# Patient Record
Sex: Female | Born: 1975 | Race: White | Hispanic: No | Marital: Single | Smoking: Former smoker
Health system: Southern US, Community
[De-identification: ages and names within clinical notes are randomized; demographics above are authoritative.]

## PROBLEM LIST (undated history)

## (undated) DIAGNOSIS — L989 Disorder of the skin and subcutaneous tissue, unspecified: Secondary | ICD-10-CM

## (undated) DIAGNOSIS — I10 Essential (primary) hypertension: Secondary | ICD-10-CM

## (undated) DIAGNOSIS — G8929 Other chronic pain: Secondary | ICD-10-CM

## (undated) DIAGNOSIS — L309 Dermatitis, unspecified: Secondary | ICD-10-CM

## (undated) DIAGNOSIS — F32A Depression, unspecified: Secondary | ICD-10-CM

## (undated) DIAGNOSIS — F329 Major depressive disorder, single episode, unspecified: Secondary | ICD-10-CM

## (undated) DIAGNOSIS — G43909 Migraine, unspecified, not intractable, without status migrainosus: Secondary | ICD-10-CM

## (undated) DIAGNOSIS — R110 Nausea: Secondary | ICD-10-CM

## (undated) DIAGNOSIS — M797 Fibromyalgia: Secondary | ICD-10-CM

## (undated) DIAGNOSIS — K219 Gastro-esophageal reflux disease without esophagitis: Secondary | ICD-10-CM

## (undated) DIAGNOSIS — M5030 Other cervical disc degeneration, unspecified cervical region: Secondary | ICD-10-CM

## (undated) DIAGNOSIS — H9190 Unspecified hearing loss, unspecified ear: Secondary | ICD-10-CM

## (undated) DIAGNOSIS — F909 Attention-deficit hyperactivity disorder, unspecified type: Secondary | ICD-10-CM

## (undated) DIAGNOSIS — D649 Anemia, unspecified: Secondary | ICD-10-CM

## (undated) DIAGNOSIS — F419 Anxiety disorder, unspecified: Secondary | ICD-10-CM

## (undated) DIAGNOSIS — B009 Herpesviral infection, unspecified: Secondary | ICD-10-CM

## (undated) DIAGNOSIS — F319 Bipolar disorder, unspecified: Secondary | ICD-10-CM

## (undated) DIAGNOSIS — T7840XA Allergy, unspecified, initial encounter: Secondary | ICD-10-CM

## (undated) DIAGNOSIS — R609 Edema, unspecified: Secondary | ICD-10-CM

## (undated) DIAGNOSIS — M2550 Pain in unspecified joint: Secondary | ICD-10-CM

## (undated) DIAGNOSIS — R32 Unspecified urinary incontinence: Secondary | ICD-10-CM

## (undated) DIAGNOSIS — E039 Hypothyroidism, unspecified: Secondary | ICD-10-CM

## (undated) HISTORY — DX: Disorder of the skin and subcutaneous tissue, unspecified: L98.9

## (undated) HISTORY — DX: Major depressive disorder, single episode, unspecified: F32.9

## (undated) HISTORY — DX: Allergy, unspecified, initial encounter: T78.40XA

## (undated) HISTORY — DX: Nausea: R11.0

## (undated) HISTORY — DX: Essential (primary) hypertension: I10

## (undated) HISTORY — DX: Unspecified urinary incontinence: R32

## (undated) HISTORY — DX: Migraine, unspecified, not intractable, without status migrainosus: G43.909

## (undated) HISTORY — DX: Edema, unspecified: R60.9

## (undated) HISTORY — DX: Other cervical disc degeneration, unspecified cervical region: M50.30

## (undated) HISTORY — DX: Depression, unspecified: F32.A

## (undated) HISTORY — DX: Attention-deficit hyperactivity disorder, unspecified type: F90.9

## (undated) HISTORY — PX: KNEE SURGERY: SHX244

## (undated) HISTORY — DX: Unspecified hearing loss, unspecified ear: H91.90

## (undated) HISTORY — DX: Hypothyroidism, unspecified: E03.9

## (undated) HISTORY — DX: Herpesviral infection, unspecified: B00.9

## (undated) HISTORY — DX: Anxiety disorder, unspecified: F41.9

## (undated) HISTORY — PX: EYE SURGERY: SHX253

## (undated) HISTORY — DX: Pain in unspecified joint: M25.50

## (undated) HISTORY — DX: Fibromyalgia: M79.7

## (undated) HISTORY — DX: Other chronic pain: G89.29

## (undated) HISTORY — DX: Bipolar disorder, unspecified: F31.9

## (undated) HISTORY — PX: TONSILLECTOMY: SUR1361

---

## 2005-08-16 ENCOUNTER — Emergency Department (HOSPITAL_COMMUNITY): Admission: EM | Admit: 2005-08-16 | Discharge: 2005-08-16 | Payer: Self-pay | Admitting: Family Medicine

## 2006-01-24 HISTORY — PX: GASTRIC BYPASS: SHX52

## 2006-05-06 ENCOUNTER — Emergency Department (HOSPITAL_COMMUNITY): Admission: EM | Admit: 2006-05-06 | Discharge: 2006-05-06 | Payer: Self-pay | Admitting: Emergency Medicine

## 2006-05-21 ENCOUNTER — Ambulatory Visit (HOSPITAL_COMMUNITY): Admission: RE | Admit: 2006-05-21 | Discharge: 2006-05-21 | Payer: Self-pay | Admitting: Orthopaedic Surgery

## 2006-05-21 ENCOUNTER — Observation Stay (HOSPITAL_COMMUNITY): Admission: AD | Admit: 2006-05-21 | Discharge: 2006-05-22 | Payer: Self-pay | Admitting: Orthopaedic Surgery

## 2007-08-28 ENCOUNTER — Other Ambulatory Visit: Admission: RE | Admit: 2007-08-28 | Discharge: 2007-08-28 | Payer: Self-pay | Admitting: Internal Medicine

## 2007-09-10 ENCOUNTER — Encounter: Admission: RE | Admit: 2007-09-10 | Discharge: 2007-09-10 | Payer: Self-pay | Admitting: Internal Medicine

## 2008-09-11 ENCOUNTER — Emergency Department (HOSPITAL_COMMUNITY): Admission: EM | Admit: 2008-09-11 | Discharge: 2008-09-12 | Payer: Self-pay | Admitting: Internal Medicine

## 2010-02-15 ENCOUNTER — Encounter: Payer: Self-pay | Admitting: Internal Medicine

## 2010-05-01 LAB — URINALYSIS, ROUTINE W REFLEX MICROSCOPIC
Bilirubin Urine: NEGATIVE
Hgb urine dipstick: NEGATIVE
Nitrite: POSITIVE — AB
Specific Gravity, Urine: >1.046 — ABNORMAL HIGH (ref 1.005–1.030)
pH: 6 (ref 5.0–8.0)

## 2010-05-01 LAB — URINE CULTURE

## 2010-05-01 LAB — DIFFERENTIAL
Basophils Relative: 1 % (ref 0–1)
Lymphocytes Relative: 25 % (ref 12–46)
Lymphs Abs: 2.9 10*3/uL (ref 0.7–4.0)
Monocytes Absolute: 0.5 10*3/uL (ref 0.1–1.0)
Monocytes Relative: 4 % (ref 3–12)
Neutro Abs: 8 10*3/uL — ABNORMAL HIGH (ref 1.7–7.7)

## 2010-05-01 LAB — POCT I-STAT, CHEM 8
Chloride: 105 mEq/L (ref 96–112)
HCT: 44 % (ref 36.0–46.0)
Hemoglobin: 15 g/dL (ref 12.0–15.0)
Potassium: 3.1 mEq/L — ABNORMAL LOW (ref 3.5–5.1)
Sodium: 139 mEq/L (ref 135–145)

## 2010-05-01 LAB — POCT PREGNANCY, URINE

## 2010-05-01 LAB — CBC
Hemoglobin: 14.8 g/dL (ref 12.0–15.0)
MCHC: 34.4 g/dL (ref 30.0–36.0)
RBC: 4.45 MIL/uL (ref 3.87–5.11)
WBC: 11.6 10*3/uL — ABNORMAL HIGH (ref 4.0–10.5)

## 2010-05-01 LAB — CROSSMATCH: Antibody Screen: NEGATIVE

## 2010-05-01 LAB — URINE MICROSCOPIC-ADD ON

## 2010-05-01 LAB — ABO/RH: ABO/RH(D): A POS

## 2010-06-11 NOTE — Op Note (Signed)
Renee [name], POPPEN NO.:  [id number]   MEDICAL RECORD NO.:  [id number]          PATIENT TYPE:  OBV   LOCATION:  5020                         FACILITY:  [facility]   PHYSICIAN:  Claude Manges. Whitfield, M.D.DATE OF BIRTH:  Mar 28, 1975   DATE OF PROCEDURE:  05/21/2006  DATE OF DISCHARGE:                               OPERATIVE REPORT   PREOPERATIVE DIAGNOSIS:  Draining hematoma, left mid leg.   POSTOPERATIVE DIAGNOSIS:  Draining hematoma, left mid leg.   PROCEDURE:  Irrigation and debridement of hematoma, left leg   SURGEON:  Claude Manges. Cleophas Dunker, M.D.   ASSISTANT:  Arnoldo Morale, [facility]   ANESTHESIA:  General orotracheal.   COMPLICATIONS:  None.   HISTORY:  A 35 year old female who sustained a blunt injury to her left  mid leg approximately 2 weeks ago.  This has been followed by an  outpatient clinic with a large pretibial hematoma.  She was seen in our  office 3 days ago for incision and drainage of the hematoma, but over  the weekend has had progressive increase in size of the hematoma and  pain.  She was seen in the after-hours clinic today with a painful leg;  compartments were intact.  There was no evidence of a Homan's.  She had  a CT scan that revealed a 9x3x9 cm hematoma.  Because of her pain and  the fact that it is draining, she is taken to the operating room for  formal irrigation and debridement.   PROCEDURE:  The patient comfortable on the operating table and under  general orotracheal anesthesia, the left lower extremity was placed in a  thigh tourniquet.  The leg was then prepped with DuraPrep from the knee  to the midfoot.  Sterile draping was performed.  With the extremity  still elevated, it was Esmarch exsanguinated with a proximal tourniquet  at 350 mmHg.   The previous I&D site was located along the mid tibia and medial to the  midline.  It was probably 1/2-inch in length.  This was elongated  proximally and distally for a total length of about 2  to 2-1/2 inches.  Via sharp dissection, the incision was carried down through subcutaneous  tissue through initial fascia.  At that point we encountered a large,  clotted hematoma and this was completely evacuated.  I felt that the  compartments were perfectly supple beneath this.  There was no obvious  gross bleeding.  We irrigated the wound, did not see any further  hematoma.  We then released the tourniquet after about 5 or 6 minutes.  There was some just minimal oozing from the depth of the wound; this was  Bovie coagulated.  We did not see any other bleeding, so we copiously  irrigated the wound with saline.  We sent portions of the clot off for  culture and sensitivity.  A Jackson-Pratt drain was inserted and  externalized through a separate stab wound distally.  The wound was  closed in several layers with 2-0 Vicryl, and then simple 4-0 Ethilon  skin stitches.  A sterile bulky dressing was applied.  The  wound was  sewn in place.  Xeroform gauze was placed about the wound and around the  Jackson-Pratt tube site.  Sterile bulky dressing was applied with a  Webril and then an Ace bandage.  We had good compression of the [employer] bulb, and it did very nice decompression of the wound.   DISPOSITION:  The patient tolerated the procedure without complications.  Will plan to observe her overnight.  Will place her on Cleocin 3 doses  and monitor the drainage.      Claude Manges. Cleophas Dunker, M.D.  Electronically Signed     PWW/MEDQ  D:  05/21/2006  T:  05/21/2006  Job:  [id number]

## 2010-08-12 ENCOUNTER — Encounter: Payer: Self-pay | Admitting: Family Medicine

## 2010-08-12 ENCOUNTER — Ambulatory Visit (INDEPENDENT_AMBULATORY_CARE_PROVIDER_SITE_OTHER): Payer: BC Managed Care – HMO | Admitting: Family Medicine

## 2010-08-12 VITALS — BP 140/90 | HR 72 | Temp 97.9°F | Ht 68.75 in | Wt 230.0 lb

## 2010-08-12 DIAGNOSIS — F909 Attention-deficit hyperactivity disorder, unspecified type: Secondary | ICD-10-CM | POA: Insufficient documentation

## 2010-08-12 DIAGNOSIS — F4310 Post-traumatic stress disorder, unspecified: Secondary | ICD-10-CM | POA: Insufficient documentation

## 2010-08-12 DIAGNOSIS — F32A Depression, unspecified: Secondary | ICD-10-CM

## 2010-08-12 DIAGNOSIS — F329 Major depressive disorder, single episode, unspecified: Secondary | ICD-10-CM

## 2010-08-12 DIAGNOSIS — F341 Dysthymic disorder: Secondary | ICD-10-CM

## 2010-08-12 DIAGNOSIS — R609 Edema, unspecified: Secondary | ICD-10-CM

## 2010-08-12 DIAGNOSIS — I10 Essential (primary) hypertension: Secondary | ICD-10-CM | POA: Insufficient documentation

## 2010-08-12 DIAGNOSIS — E039 Hypothyroidism, unspecified: Secondary | ICD-10-CM | POA: Insufficient documentation

## 2010-08-12 MED ORDER — FLUOCINONIDE 0.05 % EX CREA: TOPICAL_CREAM | CUTANEOUS | Status: AC

## 2010-08-12 MED ORDER — HYDROCHLOROTHIAZIDE 25 MG PO TABS: 25.0000 mg | ORAL_TABLET | Freq: Every day | ORAL | Status: DC

## 2010-08-12 MED ORDER — LEVOTHYROXINE SODIUM 50 MCG PO TABS: 50.0000 ug | ORAL_TABLET | Freq: Every day | ORAL | Status: DC

## 2010-08-12 MED ORDER — LEVOTHYROXINE SODIUM 200 MCG PO TABS: 200.0000 ug | ORAL_TABLET | Freq: Every day | ORAL | Status: DC

## 2010-08-12 NOTE — Progress Notes (Signed)
  Subjective:    Patient ID: Renee [name], female    DOB: 1975/10/03, 35 y.o.   MRN: [id number]  HPI  35 yo here to establish care.  Anxiety/depression- 81 yo brother died unexpectedly 9 months ago.  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 Undergoing extensive psychotherapy with [facility] and Dr. Sharl Ma. Started Lexapro 20 mg last week.   Also taking Xanax as needed. No SI or HI.  Has very supportive girlfriend.  Hypothyroidism- on Synthroid 250 mcg!!  Needs medication refilled. She feels like her thyroid may be off- gaining weight, tired. She is s/p gastric bypass 4 years ago and continues to gain weight since her brother died.  LE edema- feels like her legs are swollen.  No SOB or CP.  HTN- on HCTZ 25 mg daily,a dn Potassium 20 mg daily.   Review of Systems    See HPI Objective:   Physical Exam BP 140/90  Pulse 72  Temp(Src) 97.9 F (36.6 C) (Oral)  Ht 5' 8.75" (1.746 m)  Wt 230 lb (104.327 kg)  BMI 34.21 kg/m2  LMP 08/09/2010  General:  Well-developed,well-nourished,in no acute distress; alert,appropriate and cooperative throughout examination Head:  normocephalic and atraumatic.   Eyes:  vision grossly intact, pupils equal, pupils round, and pupils reactive to light.   Ears:  R ear normal and L ear normal.   Nose:  no external deformity.   Mouth:  good dentition.   Neck:  No deformities, masses, or tenderness noted. Lungs:  Normal respiratory effort, chest expands symmetrically. Lungs are clear to auscultation, no crackles or wheezes. Heart:  Normal rate and regular rhythm. S1 and S2 normal without gallop, murmur, click, rub or other extra sounds. Abdomen:  Bowel sounds positive,abdomen soft and non-tender without masses, organomegaly or hernias noted. Msk:  No deformity or scoliosis noted of thoracic or lumbar spine.   Extremities:  No clubbing, cyanosis, edema, or deformity noted with normal full range of motion of all  joints.   Neurologic:  alert & oriented X3 and gait normal.   Skin:  Intact without suspicious lesions or rashes Cervical Nodes:  No lymphadenopathy noted Axillary Nodes:  No palpable lymphadenopathy Psych:  Cognition and judgment appear intact.  Speech is pressured and has difficulty focusing.        Assessment & Plan:   1. Edema   New.  No edema appreciated on exam. Will check TSH, CBC, BMET today.   2. Hypothyroidism  Refilled synthroid. Will check thyroid labs today. Orders Placed This Encounter  Procedures  . Basic Metabolic Panel (BMET)  . TSH  . T4, free  . CBC w/Diff     3. Hypertension  Stable.  Continue HCTZ on Potassium.  4. PTSD (post-traumatic stress disorder)  Unchanged.  Followed by Dr. Sharl [facility] and [facility].  Awaiting records.  5. Anxiety and depression  See above.

## 2010-08-13 LAB — BASIC METABOLIC PANEL
BUN: 14 mg/dL (ref 6–23)
CO2: 28 mEq/L (ref 19–32)
Chloride: 110 mEq/L (ref 96–112)
Glucose, Bld: 83 mg/dL (ref 70–99)
Potassium: 4.7 mEq/L (ref 3.5–5.1)

## 2010-08-13 LAB — CBC WITH DIFFERENTIAL/PLATELET
Basophils Relative: 0.5 % (ref 0.0–3.0)
Eosinophils Absolute: 0.2 10*3/uL (ref 0.0–0.7)
HCT: 41.7 % (ref 36.0–46.0)
Hemoglobin: 13.9 g/dL (ref 12.0–15.0)
Lymphocytes Relative: 47.3 % — ABNORMAL HIGH (ref 12.0–46.0)
MCHC: 33.5 g/dL (ref 30.0–36.0)
Neutro Abs: 2.7 10*3/uL (ref 1.4–7.7)
RBC: 4.32 Mil/uL (ref 3.87–5.11)

## 2010-08-13 LAB — TSH: TSH: 0.08 u[IU]/mL — ABNORMAL LOW (ref 0.35–5.50)

## 2010-08-18 ENCOUNTER — Encounter: Payer: Self-pay | Admitting: *Deleted

## 2010-08-19 ENCOUNTER — Telehealth: Payer: Self-pay | Admitting: *Deleted

## 2010-08-19 NOTE — Telephone Encounter (Signed)
Patient called and was advised of her lab results. 

## 2010-08-23 ENCOUNTER — Telehealth: Payer: Self-pay | Admitting: *Deleted

## 2010-08-23 MED ORDER — VALACYCLOVIR HCL 1 G PO TABS: 1000.0000 mg | ORAL_TABLET | Freq: Three times a day (TID) | ORAL | Status: DC

## 2010-08-23 NOTE — Telephone Encounter (Signed)
I will also forward this to [facility] to look into it.

## 2010-08-23 NOTE — Telephone Encounter (Signed)
I can send in Valtrex to treat shingles but that is not standard of care without seeing the rash. Due to her girlfriend's situation, it is appropriate. She does still need to be evaluated. Please send in rx for valtrex 1 g po three times daily x 7 days, #21, no refills.

## 2010-08-23 NOTE — Telephone Encounter (Signed)
Pt has rashes over several areas of her body.  She thinks this is patches of shingles.  She is asking for a stronger medicine to treat this.  The cream that she is using is causing burning and sweating.  Also, her girlfriend, who has a weakened immune system, has never had chicken pox and pt is concerned about this.  She is asking that you call her to discuss this.

## 2010-08-23 NOTE — Telephone Encounter (Signed)
Patient advised as instructed via telephone.  Rx sent to [facility]  She stated that her last OV was not covered.  She said that she was told by her insurance company that Dr. Dayton Martes did not participate with [employer].  I gave patient the phone number to the billing dept.  She stated that she will check with them before she schedules another appt.

## 2010-09-15 ENCOUNTER — Telehealth: Payer: Self-pay | Admitting: Family Medicine

## 2010-09-15 NOTE — Telephone Encounter (Signed)
Aram Beecham confirmed billing/credentialing had been made aware and the issue had been previously resolved.  Dr. Dayton Martes is a participating physician.  Pt called.

## 2010-10-11 ENCOUNTER — Other Ambulatory Visit: Payer: Self-pay | Admitting: Internal Medicine

## 2010-10-11 DIAGNOSIS — R109 Unspecified abdominal pain: Secondary | ICD-10-CM

## 2010-10-13 ENCOUNTER — Other Ambulatory Visit: Payer: BC Managed Care – HMO

## 2010-12-29 ENCOUNTER — Other Ambulatory Visit: Payer: Self-pay | Admitting: Family Medicine

## 2011-01-27 ENCOUNTER — Other Ambulatory Visit: Payer: Self-pay | Admitting: Family Medicine

## 2011-03-16 ENCOUNTER — Other Ambulatory Visit: Payer: Self-pay | Admitting: Sports Medicine

## 2011-03-16 DIAGNOSIS — M542 Cervicalgia: Secondary | ICD-10-CM

## 2011-03-20 ENCOUNTER — Ambulatory Visit
Admission: RE | Admit: 2011-03-20 | Discharge: 2011-03-20 | Disposition: A | Discharge status: Home / Self Care | Payer: BC Managed Care – HMO | Source: Ambulatory Visit | Attending: Sports Medicine | Admitting: Sports Medicine

## 2011-03-20 DIAGNOSIS — M542 Cervicalgia: Secondary | ICD-10-CM

## 2011-11-03 ENCOUNTER — Other Ambulatory Visit: Payer: Self-pay | Admitting: Family Medicine

## 2011-12-30 ENCOUNTER — Other Ambulatory Visit: Payer: Self-pay | Admitting: Family Medicine

## 2011-12-30 NOTE — Telephone Encounter (Signed)
Ok to refill only once.  Needs to be seen for further refills. 

## 2011-12-30 NOTE — Telephone Encounter (Signed)
Pt hasnt been seen since 07/2010 and has no upcoming appt's scheduled.  Please advise.

## 2012-01-31 ENCOUNTER — Other Ambulatory Visit: Payer: Self-pay | Admitting: *Deleted

## 2012-01-31 MED ORDER — LEVOTHYROXINE SODIUM 200 MCG PO TABS: ORAL_TABLET | ORAL | Status: DC

## 2012-03-01 ENCOUNTER — Other Ambulatory Visit: Payer: Self-pay | Admitting: Family Medicine

## 2012-03-01 NOTE — Telephone Encounter (Signed)
Received call from [facility] about thyroid med refill; pharmacist needed refill for thyroid med . Med was refilled by Dr Elby Showers . I called pt to see if she wanted to schedule appt with Dr Dayton Martes or was she getting med from Dr Clent Ridges; pt was upset and Hansel Starling the office manager spoke with pt. I advised [facility] that she needed to contact Dr Claris Che office re: refill.

## 2012-08-14 DIAGNOSIS — F419 Anxiety disorder, unspecified: Secondary | ICD-10-CM | POA: Insufficient documentation

## 2012-08-14 DIAGNOSIS — F988 Other specified behavioral and emotional disorders with onset usually occurring in childhood and adolescence: Secondary | ICD-10-CM | POA: Insufficient documentation

## 2012-11-14 ENCOUNTER — Other Ambulatory Visit: Payer: Self-pay | Admitting: Geriatric Medicine

## 2012-11-14 ENCOUNTER — Other Ambulatory Visit (HOSPITAL_COMMUNITY)
Admission: RE | Admit: 2012-11-14 | Discharge: 2012-11-14 | Disposition: A | Discharge status: Home / Self Care | Payer: BC Managed Care – HMO | Source: Ambulatory Visit | Attending: Geriatric Medicine | Admitting: Geriatric Medicine

## 2012-11-14 DIAGNOSIS — Z1151 Encounter for screening for human papillomavirus (HPV): Secondary | ICD-10-CM | POA: Insufficient documentation

## 2012-11-14 DIAGNOSIS — Z01419 Encounter for gynecological examination (general) (routine) without abnormal findings: Secondary | ICD-10-CM | POA: Insufficient documentation

## 2012-11-30 ENCOUNTER — Ambulatory Visit (HOSPITAL_BASED_OUTPATIENT_CLINIC_OR_DEPARTMENT_OTHER): Payer: BC Managed Care – HMO | Admitting: Anesthesiology

## 2012-11-30 ENCOUNTER — Emergency Department (HOSPITAL_COMMUNITY): Payer: BC Managed Care – HMO

## 2012-11-30 ENCOUNTER — Encounter (HOSPITAL_BASED_OUTPATIENT_CLINIC_OR_DEPARTMENT_OTHER): Payer: BC Managed Care – HMO | Admitting: Anesthesiology

## 2012-11-30 ENCOUNTER — Ambulatory Visit (HOSPITAL_BASED_OUTPATIENT_CLINIC_OR_DEPARTMENT_OTHER)
Admission: RE | Admit: 2012-11-30 | Discharge: 2012-11-30 | Disposition: A | Discharge status: Home / Self Care | Payer: BC Managed Care – HMO | Source: Ambulatory Visit | Attending: Orthopedic Surgery | Admitting: Orthopedic Surgery

## 2012-11-30 ENCOUNTER — Encounter (HOSPITAL_COMMUNITY): Payer: Self-pay | Admitting: Emergency Medicine

## 2012-11-30 ENCOUNTER — Other Ambulatory Visit: Payer: Self-pay | Admitting: Orthopedic Surgery

## 2012-11-30 ENCOUNTER — Encounter (HOSPITAL_BASED_OUTPATIENT_CLINIC_OR_DEPARTMENT_OTHER): Payer: Self-pay

## 2012-11-30 ENCOUNTER — Encounter (HOSPITAL_BASED_OUTPATIENT_CLINIC_OR_DEPARTMENT_OTHER)
Admission: RE | Disposition: A | Discharge status: Home / Self Care | Payer: Self-pay | Source: Ambulatory Visit | Attending: Orthopedic Surgery

## 2012-11-30 ENCOUNTER — Emergency Department (HOSPITAL_COMMUNITY)
Admission: EM | Admit: 2012-11-30 | Discharge: 2012-11-30 | Disposition: A | Discharge status: Home / Self Care | Payer: BC Managed Care – HMO | Source: Home / Self Care | Attending: Emergency Medicine | Admitting: Emergency Medicine

## 2012-11-30 DIAGNOSIS — Z9884 Bariatric surgery status: Secondary | ICD-10-CM | POA: Insufficient documentation

## 2012-11-30 DIAGNOSIS — F909 Attention-deficit hyperactivity disorder, unspecified type: Secondary | ICD-10-CM | POA: Insufficient documentation

## 2012-11-30 DIAGNOSIS — F329 Major depressive disorder, single episode, unspecified: Secondary | ICD-10-CM | POA: Insufficient documentation

## 2012-11-30 DIAGNOSIS — F3289 Other specified depressive episodes: Secondary | ICD-10-CM | POA: Insufficient documentation

## 2012-11-30 DIAGNOSIS — Z885 Allergy status to narcotic agent status: Secondary | ICD-10-CM | POA: Insufficient documentation

## 2012-11-30 DIAGNOSIS — F988 Other specified behavioral and emotional disorders with onset usually occurring in childhood and adolescence: Secondary | ICD-10-CM | POA: Insufficient documentation

## 2012-11-30 DIAGNOSIS — Z880 Allergy status to penicillin: Secondary | ICD-10-CM | POA: Insufficient documentation

## 2012-11-30 DIAGNOSIS — I10 Essential (primary) hypertension: Secondary | ICD-10-CM | POA: Insufficient documentation

## 2012-11-30 DIAGNOSIS — W010XXA Fall on same level from slipping, tripping and stumbling without subsequent striking against object, initial encounter: Secondary | ICD-10-CM | POA: Insufficient documentation

## 2012-11-30 DIAGNOSIS — W1789XA Other fall from one level to another, initial encounter: Secondary | ICD-10-CM | POA: Insufficient documentation

## 2012-11-30 DIAGNOSIS — F411 Generalized anxiety disorder: Secondary | ICD-10-CM | POA: Insufficient documentation

## 2012-11-30 DIAGNOSIS — Z79899 Other long term (current) drug therapy: Secondary | ICD-10-CM | POA: Insufficient documentation

## 2012-11-30 DIAGNOSIS — S5292XA Unspecified fracture of left forearm, initial encounter for closed fracture: Secondary | ICD-10-CM

## 2012-11-30 DIAGNOSIS — W1809XA Striking against other object with subsequent fall, initial encounter: Secondary | ICD-10-CM | POA: Insufficient documentation

## 2012-11-30 DIAGNOSIS — S52599A Other fractures of lower end of unspecified radius, initial encounter for closed fracture: Secondary | ICD-10-CM | POA: Insufficient documentation

## 2012-11-30 DIAGNOSIS — E039 Hypothyroidism, unspecified: Secondary | ICD-10-CM | POA: Insufficient documentation

## 2012-11-30 DIAGNOSIS — Y9289 Other specified places as the place of occurrence of the external cause: Secondary | ICD-10-CM | POA: Insufficient documentation

## 2012-11-30 DIAGNOSIS — Y9389 Activity, other specified: Secondary | ICD-10-CM | POA: Insufficient documentation

## 2012-11-30 HISTORY — PX: OPEN REDUCTION INTERNAL FIXATION (ORIF) DISTAL RADIAL FRACTURE: SHX5989

## 2012-11-30 SURGERY — OPEN REDUCTION INTERNAL FIXATION (ORIF) DISTAL RADIUS FRACTURE
Anesthesia: Regional | Site: Wrist | Laterality: Left | Wound class: Dirty or Infected

## 2012-11-30 MED ORDER — MIDAZOLAM HCL 2 MG/2ML IJ SOLN
INTRAMUSCULAR | Status: AC
  Filled 2012-11-30: qty 2

## 2012-11-30 MED ORDER — ONDANSETRON HCL 4 MG/2ML IJ SOLN
INTRAMUSCULAR | Status: DC | PRN
  Administered 2012-11-30: 4 mg via INTRAVENOUS

## 2012-11-30 MED ORDER — PROPOFOL 10 MG/ML IV BOLUS
100.0000 mg | Freq: Once | INTRAVENOUS | Status: AC
  Filled 2012-11-30 (×2): qty 20

## 2012-11-30 MED ORDER — LACTATED RINGERS IV SOLN
INTRAVENOUS | Status: DC
  Administered 2012-11-30: 14:00:00 via INTRAVENOUS

## 2012-11-30 MED ORDER — FENTANYL CITRATE 0.05 MG/ML IJ SOLN
INTRAMUSCULAR | Status: DC | PRN
  Administered 2012-11-30 (×2): 50 ug via INTRAVENOUS

## 2012-11-30 MED ORDER — BUPIVACAINE-EPINEPHRINE PF 0.5-1:200000 % IJ SOLN
INTRAMUSCULAR | Status: DC | PRN
  Administered 2012-11-30: 25 mL

## 2012-11-30 MED ORDER — CEFAZOLIN SODIUM 1-5 GM-% IV SOLN
1.0000 g | Freq: Once | INTRAVENOUS | Status: AC
  Administered 2012-11-30: 1 g via INTRAVENOUS
  Filled 2012-11-30: qty 50

## 2012-11-30 MED ORDER — HYDROMORPHONE HCL PF 1 MG/ML IJ SOLN
0.2500 mg | INTRAMUSCULAR | Status: DC | PRN
Start: 2012-11-30 — End: 2012-11-30

## 2012-11-30 MED ORDER — HYDROMORPHONE HCL PF 1 MG/ML IJ SOLN
1.0000 mg | Freq: Once | INTRAMUSCULAR | Status: AC
  Administered 2012-11-30: 1 mg via INTRAVENOUS
  Filled 2012-11-30: qty 1

## 2012-11-30 MED ORDER — HYDROMORPHONE HCL 2 MG PO TABS: 2.0000 mg | ORAL_TABLET | Freq: Four times a day (QID) | ORAL | Status: DC | PRN

## 2012-11-30 MED ORDER — VANCOMYCIN HCL IN DEXTROSE 1-5 GM/200ML-% IV SOLN
INTRAVENOUS | Status: AC
  Filled 2012-11-30: qty 200

## 2012-11-30 MED ORDER — OXYCODONE HCL 5 MG/5ML PO SOLN: 5.0000 mg | Freq: Once | ORAL | Status: DC | PRN

## 2012-11-30 MED ORDER — FENTANYL CITRATE 0.05 MG/ML IJ SOLN
50.0000 ug | Freq: Once | INTRAMUSCULAR | Status: AC
  Administered 2012-11-30: 50 ug via INTRAVENOUS
  Filled 2012-11-30: qty 2

## 2012-11-30 MED ORDER — FENTANYL CITRATE 0.05 MG/ML IJ SOLN
INTRAMUSCULAR | Status: AC
  Filled 2012-11-30: qty 2

## 2012-11-30 MED ORDER — ETOMIDATE 2 MG/ML IV SOLN
INTRAVENOUS | Status: AC | PRN
  Administered 2012-11-30 (×2): 10 mg via INTRAVENOUS

## 2012-11-30 MED ORDER — OXYCODONE HCL 5 MG PO TABS: 5.0000 mg | ORAL_TABLET | Freq: Once | ORAL | Status: DC | PRN

## 2012-11-30 MED ORDER — HYDROMORPHONE HCL 2 MG PO TABS
ORAL_TABLET | ORAL | Status: AC
  Filled 2012-11-30: qty 1

## 2012-11-30 MED ORDER — VANCOMYCIN HCL IN DEXTROSE 1-5 GM/200ML-% IV SOLN
1000.0000 mg | INTRAVENOUS | Status: AC
  Administered 2012-11-30: 1000 mg via INTRAVENOUS

## 2012-11-30 MED ORDER — OXYCODONE-ACETAMINOPHEN 5-325 MG PO TABS: 1.0000 | ORAL_TABLET | ORAL | Status: DC | PRN

## 2012-11-30 MED ORDER — CEPHALEXIN 500 MG PO CAPS: 500.0000 mg | ORAL_CAPSULE | Freq: Four times a day (QID) | ORAL | Status: DC

## 2012-11-30 MED ORDER — MIDAZOLAM HCL 2 MG/2ML IJ SOLN
1.0000 mg | INTRAMUSCULAR | Status: DC | PRN
  Administered 2012-11-30: 3 mg via INTRAVENOUS

## 2012-11-30 MED ORDER — HYDROMORPHONE HCL 2 MG PO TABS
2.0000 mg | ORAL_TABLET | Freq: Once | ORAL | Status: AC
  Administered 2012-11-30: 2 mg via ORAL

## 2012-11-30 MED ORDER — LIDOCAINE HCL (CARDIAC) 20 MG/ML IV SOLN
INTRAVENOUS | Status: DC | PRN
  Administered 2012-11-30: 100 mg via INTRAVENOUS

## 2012-11-30 MED ORDER — FENTANYL CITRATE 0.05 MG/ML IJ SOLN
INTRAMUSCULAR | Status: AC
  Filled 2012-11-30: qty 6

## 2012-11-30 MED ORDER — HYDROMORPHONE HCL PF 1 MG/ML IJ SOLN
0.5000 mg | Freq: Once | INTRAMUSCULAR | Status: AC
  Administered 2012-11-30: 0.5 mg via INTRAVENOUS
  Filled 2012-11-30: qty 1

## 2012-11-30 MED ORDER — ONDANSETRON HCL 4 MG/2ML IJ SOLN: 4.0000 mg | Freq: Once | INTRAMUSCULAR | Status: DC | PRN

## 2012-11-30 MED ORDER — MIDAZOLAM HCL 2 MG/2ML IJ SOLN
2.0000 mg | Freq: Once | INTRAMUSCULAR | Status: AC
  Filled 2012-11-30: qty 2

## 2012-11-30 MED ORDER — FENTANYL CITRATE 0.05 MG/ML IJ SOLN
100.0000 ug | Freq: Once | INTRAMUSCULAR | Status: AC
  Administered 2012-11-30: 100 ug via INTRAVENOUS
  Filled 2012-11-30: qty 2

## 2012-11-30 MED ORDER — PROPOFOL 10 MG/ML IV BOLUS
INTRAVENOUS | Status: DC | PRN
  Administered 2012-11-30: 300 mg via INTRAVENOUS

## 2012-11-30 MED ORDER — PROPOFOL 10 MG/ML IV BOLUS
INTRAVENOUS | Status: AC | PRN
  Administered 2012-11-30: 15 mg via INTRAVENOUS
  Administered 2012-11-30: 50 mg via INTRAVENOUS

## 2012-11-30 MED ORDER — OXYCODONE-ACETAMINOPHEN 5-325 MG PO TABS: ORAL_TABLET | ORAL | Status: DC

## 2012-11-30 MED ORDER — OXYCODONE-ACETAMINOPHEN 5-325 MG PO TABS
1.0000 | ORAL_TABLET | Freq: Once | ORAL | Status: AC
  Administered 2012-11-30: 1 via ORAL
  Filled 2012-11-30: qty 1

## 2012-11-30 MED ORDER — PROPOFOL BOLUS VIA INFUSION: 100.0000 mg | Freq: Once | INTRAVENOUS | Status: DC

## 2012-11-30 MED ORDER — CHLORHEXIDINE GLUCONATE 4 % EX LIQD: 60.0000 mL | Freq: Once | CUTANEOUS | Status: DC

## 2012-11-30 MED ORDER — DEXAMETHASONE SODIUM PHOSPHATE 10 MG/ML IJ SOLN
INTRAMUSCULAR | Status: DC | PRN
  Administered 2012-11-30: 8 mg

## 2012-11-30 MED ORDER — MIDAZOLAM HCL 2 MG/2ML IJ SOLN
INTRAMUSCULAR | Status: AC | PRN
  Administered 2012-11-30: 2 mg via INTRAVENOUS

## 2012-11-30 MED ORDER — FENTANYL CITRATE 0.05 MG/ML IJ SOLN
50.0000 ug | Freq: Once | INTRAMUSCULAR | Status: AC
  Administered 2012-11-30: 100 ug via INTRAVENOUS

## 2012-11-30 SURGICAL SUPPLY — 68 items
BANDAGE ELASTIC 3 VELCRO ST LF (GAUZE/BANDAGES/DRESSINGS) ×2 IMPLANT
BANDAGE GAUZE ELAST BULKY 4 IN (GAUZE/BANDAGES/DRESSINGS) ×2 IMPLANT
BIT DRILL 2.0 LNG QUCK RELEASE (BIT) ×1 IMPLANT
BIT DRILL 2.8X5 QR DISP (BIT) ×2 IMPLANT
BLADE MINI RND TIP GREEN BEAV (BLADE) IMPLANT
BLADE SURG 15 STRL LF DISP TIS (BLADE) ×2 IMPLANT
BLADE SURG 15 STRL SS (BLADE) ×2
BNDG CMPR 9X4 STRL LF SNTH (GAUZE/BANDAGES/DRESSINGS) ×1
BNDG ESMARK 4X9 LF (GAUZE/BANDAGES/DRESSINGS) ×2 IMPLANT
CHLORAPREP W/TINT 26ML (MISCELLANEOUS) ×2 IMPLANT
CORDS BIPOLAR (ELECTRODE) ×2 IMPLANT
COVER MAYO STAND STRL (DRAPES) ×2 IMPLANT
COVER TABLE BACK 60X90 (DRAPES) ×2 IMPLANT
DRAPE EXTREMITY T 121X128X90 (DRAPE) ×2 IMPLANT
DRAPE OEC MINIVIEW 54X84 (DRAPES) ×2 IMPLANT
DRAPE SURG 17X23 STRL (DRAPES) ×2 IMPLANT
DRILL 2.0 LNG QUICK RELEASE (BIT) ×2
GAUZE XEROFORM 1X8 LF (GAUZE/BANDAGES/DRESSINGS) ×2 IMPLANT
GLOVE BIO SURGEON STRL SZ7.5 (GLOVE) IMPLANT
GLOVE BIOGEL PI IND STRL 7.0 (GLOVE) ×1 IMPLANT
GLOVE BIOGEL PI IND STRL 8 (GLOVE) ×1 IMPLANT
GLOVE BIOGEL PI IND STRL 8.5 (GLOVE) ×1 IMPLANT
GLOVE BIOGEL PI INDICATOR 7.0 (GLOVE) ×1
GLOVE BIOGEL PI INDICATOR 8 (GLOVE) ×1
GLOVE BIOGEL PI INDICATOR 8.5 (GLOVE) ×1
GLOVE SURG ORTHO 8.0 STRL STRW (GLOVE) IMPLANT
GLOVE SURG SS PI 7.5 STRL IVOR (GLOVE) ×2 IMPLANT
GLOVE SURG SS PI 8.0 STRL IVOR (GLOVE) ×2 IMPLANT
GOWN BRE IMP PREV XXLGXLNG (GOWN DISPOSABLE) ×4 IMPLANT
GOWN PREVENTION PLUS XLARGE (GOWN DISPOSABLE) ×2 IMPLANT
GUIDEWIRE ORTHO 0.054X6 (WIRE) ×6 IMPLANT
NEEDLE HYPO 25X1 1.5 SAFETY (NEEDLE) IMPLANT
NS IRRIG 1000ML POUR BTL (IV SOLUTION) ×2 IMPLANT
PACK BASIN DAY SURGERY FS (CUSTOM PROCEDURE TRAY) ×2 IMPLANT
PAD CAST 3X4 CTTN HI CHSV (CAST SUPPLIES) ×1 IMPLANT
PADDING CAST ABS 4INX4YD NS (CAST SUPPLIES) ×1
PADDING CAST ABS COTTON 4X4 ST (CAST SUPPLIES) ×1 IMPLANT
PADDING CAST COTTON 3X4 STRL (CAST SUPPLIES) ×1
PLATE ACULOC 2 VDR STD LT (Plate) ×2 IMPLANT
SCREW ACTK 2 NL HEX 3.5.11 (Screw) ×2 IMPLANT
SCREW CORT FT 18X2.3XLCK HEX (Screw) ×1 IMPLANT
SCREW CORT FT 20X2.3XLCK HEX (Screw) ×1 IMPLANT
SCREW CORTICAL LOCKING 2.3X16M (Screw) ×2 IMPLANT
SCREW CORTICAL LOCKING 2.3X18M (Screw) ×8 IMPLANT
SCREW CORTICAL LOCKING 2.3X20M (Screw) ×3 IMPLANT
SCREW FX16X2.3XLCK SMTH NS CRT (Screw) ×1 IMPLANT
SCREW FX18X2.3XSMTH LCK NS CRT (Screw) ×3 IMPLANT
SCREW FX20X2.3XSMTH LCK NS CRT (Screw) ×1 IMPLANT
SCREW HEXALOBE NON-LOCK 3.5X14 (Screw) ×2 IMPLANT
SCREW NLCKG 13 3.5X13 HEXA (Screw) ×1 IMPLANT
SCREW NON-LOCK 3.5X13 (Screw) ×2 IMPLANT
SCREW NONLOCK HEX 3.5X12 (Screw) ×2 IMPLANT
SLEEVE SCD COMPRESS KNEE MED (MISCELLANEOUS) ×2 IMPLANT
SPLINT PLASTER CAST XFAST 4X15 (CAST SUPPLIES) IMPLANT
SPLINT PLASTER XTRA FAST SET 4 (CAST SUPPLIES)
SPONGE GAUZE 4X4 12PLY (GAUZE/BANDAGES/DRESSINGS) ×2 IMPLANT
STOCKINETTE 4X48 STRL (DRAPES) ×2 IMPLANT
SUCTION FRAZIER TIP 10 FR DISP (SUCTIONS) IMPLANT
SUT ETHILON 3 0 PS 1 (SUTURE) IMPLANT
SUT ETHILON 4 0 PS 2 18 (SUTURE) ×4 IMPLANT
SUT VIC AB 3-0 PS1 18 (SUTURE)
SUT VIC AB 3-0 PS1 18XBRD (SUTURE) IMPLANT
SUT VICRYL 4-0 PS2 18IN ABS (SUTURE) ×2 IMPLANT
SYR BULB 3OZ (MISCELLANEOUS) ×2 IMPLANT
SYR CONTROL 10ML LL (SYRINGE) IMPLANT
TOWEL OR 17X24 6PK STRL BLUE (TOWEL DISPOSABLE) ×4 IMPLANT
TUBE CONNECTING 20X1/4 (TUBING) IMPLANT
UNDERPAD 30X30 INCONTINENT (UNDERPADS AND DIAPERS) ×2 IMPLANT

## 2012-11-30 NOTE — Anesthesia Procedure Notes (Addendum)
Anesthesia Regional Block:  Supraclavicular block  Pre-Anesthetic Checklist: ,, timeout performed, Correct Patient, Correct Site, Correct Laterality, Correct Procedure, Correct Position, site marked, Risks and benefits discussed,  Surgical consent,  Pre-op evaluation,  At surgeon's request and post-op pain management  Laterality: Left and Upper  Prep: chloraprep       Needles:  Injection technique: Single-shot  Needle Type: Echogenic Stimulator Needle     Needle Length: 5cm 5 cm Needle Gauge: 21 and 21 G    Additional Needles:  Procedures: ultrasound guided (picture in chart) Supraclavicular block Narrative:  Start time: 11/30/2012 2:02 PM End time: 11/30/2012 2:10 PM Injection made incrementally with aspirations every 5 mL.  Performed by: Personally  Anesthesiologist: Sheldon Silvan  Supraclavicular block Procedure Name: LMA Insertion Date/Time: 11/30/2012 2:32 PM Performed by: Tami Ribas Pre-anesthesia Checklist: Patient identified, Emergency Drugs available, Suction available and Patient being monitored Patient Re-evaluated:Patient Re-evaluated prior to inductionOxygen Delivery Method: Circle System Utilized Preoxygenation: Pre-oxygenation with 100% oxygen Intubation Type: IV induction Ventilation: Mask ventilation without difficulty LMA: LMA inserted LMA Size: 4.0 Number of attempts: 1 Airway Equipment and Method: bite block Placement Confirmation: positive ETCO2 and breath sounds checked- equal and bilateral Tube secured with: Tape Dental Injury: Teeth and Oropharynx as per pre-operative assessment

## 2012-11-30 NOTE — ED Notes (Addendum)
Pt. tripped and fell at home this evening injured her left wrist with deformity /pain/swelling . Pt. stated she had a few ( ETOH ) drinks this evening . No LOC .
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
## 2012-11-30 NOTE — Transfer of Care (Signed)
Immediate Anesthesia Transfer of Care Note  Patient: Renee [name]  Procedure(s) Performed: Procedure(s) with comments: OPEN REDUCTION INTERNAL FIXATION (ORIF) DISTAL RADIAL FRACTURE (Left) - orif left distal radius   Patient Location: PACU  Anesthesia Type:GA combined with regional for post-op pain  Level of Consciousness: awake, alert  and oriented  Airway & Oxygen Therapy: Patient Spontanous Breathing and Patient connected to face mask oxygen  Post-op Assessment: Report given to PACU RN, Post -op Vital signs reviewed and stable and Patient moving all extremities  Post vital signs: Reviewed and stable  Complications: No apparent anesthesia complications

## 2012-11-30 NOTE — Progress Notes (Signed)
  Assisted Dr. Crews with left, ultrasound guided, supraclavicular block. Side rails up, monitors on throughout procedure. See vital signs in flow sheet. Tolerated Procedure well. 

## 2012-11-30 NOTE — Anesthesia Preprocedure Evaluation (Signed)
Anesthesia Evaluation  Patient identified by MRN, date of birth, ID band Patient awake    Reviewed: Allergy & Precautions, H&P , NPO status , Patient's Chart, lab work & pertinent test results  Airway Mallampati: I TM Distance: >3 FB Neck ROM: Full    Dental  (+) Teeth Intact and Dental Advisory Given   Pulmonary  breath sounds clear to auscultation        Cardiovascular hypertension, Pt. on medications Rhythm:Regular Rate:Normal     Neuro/Psych    GI/Hepatic   Endo/Other  Morbid obesity  Renal/GU      Musculoskeletal   Abdominal   Peds  Hematology   Anesthesia Other Findings   Reproductive/Obstetrics                           Anesthesia Physical Anesthesia Plan  ASA: III  Anesthesia Plan: General   Post-op Pain Management:    Induction: Intravenous  Airway Management Planned: LMA  Additional Equipment:   Intra-op Plan:   Post-operative Plan: Extubation in OR  Informed Consent: I have reviewed the patients History and Physical, chart, labs and discussed the procedure including the risks, benefits and alternatives for the proposed anesthesia with the patient or authorized representative who has indicated his/her understanding and acceptance.   Dental advisory given  Plan Discussed with: CRNA, Anesthesiologist and Surgeon  Anesthesia Plan Comments:         Anesthesia Quick Evaluation

## 2012-11-30 NOTE — ED Notes (Signed)
Pt placed on non-rebreather per Dr. Hyacinth Meeker

## 2012-11-30 NOTE — Brief Op Note (Signed)
11/30/2012  3:50 PM  PATIENT:  Renee [name]  37 y.o. female  PRE-OPERATIVE DIAGNOSIS:  left distal radius fx  POST-OPERATIVE DIAGNOSIS:  Left distal Radius Fracture  PROCEDURE:  Procedure(s) with comments: OPEN REDUCTION INTERNAL FIXATION (ORIF) DISTAL RADIAL FRACTURE (Left) - orif left distal radius   SURGEON:  Surgeon(s) and Role:    * Tami Ribas, MD - Primary  PHYSICIAN ASSISTANT:   ASSISTANTS: Cindee Salt, MD   ANESTHESIA:   regional and general  EBL:  Total I/O In: 1400 [I.V.:1400] Out: -   BLOOD ADMINISTERED:none  DRAINS: none   LOCAL MEDICATIONS USED:  NONE  SPECIMEN:  No Specimen  DISPOSITION OF SPECIMEN:  N/A  COUNTS:  YES  TOURNIQUET:   Total Tourniquet Time Documented: Upper Arm (Left) - 59 minutes Total: Upper Arm (Left) - 59 minutes   DICTATION: .Other Dictation: Dictation Number [id number]  PLAN OF CARE: Discharge to home after PACU  PATIENT DISPOSITION:  PACU - hemodynamically stable.
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
## 2012-11-30 NOTE — ED Provider Notes (Signed)
Medical screening examination/treatment/procedure(s) were conducted as a shared visit with non-physician practitioner(s) and myself.  I personally evaluated the patient during the encounter  Please see my separate respective documentation pertaining to this patient encounter   Vida Roller, MD 11/30/12 [phone]

## 2012-11-30 NOTE — ED Provider Notes (Signed)
Pt with FOOSH injury to the L hand / wrist - occurred just pta - is severe, associated with swelling, small amount of bleeding and deformity to the L wrist - normal pulses, normla CRT, normal sensation distal to injury - xray confirms that she has a comminuted distal radius.  Procedural sedation Performed by: Vida Roller Consent: Verbal consent obtained. Risks and benefits: risks, benefits and alternatives were discussed Required items: required blood products, implants, devices, and special equipment available Patient identity confirmed: arm band and provided demographic data Time out: Immediately prior to procedure a "time out" was called to verify the correct patient, procedure, equipment, support staff and site/side marked as required.  Sedation type: moderate (conscious) sedation NPO time confirmed and considedered  Sedatives: PROPOFOL 100mg , Versed 2mg , Etomidate 20mg  Analgesia:  Fentanyl 100ucg  Physician Time at Bedside: 25 minutes  Vitals: Vital signs were monitored during sedation. Cardiac Monitor, pulse oximeter Patient tolerance: Patient tolerated the procedure well with no immediate complications. Comments: Pt with uneventful recovered. Returned to pre-procedural sedation baseline  Procedure Note:      Fracture Reduction  Date, Time: November 30 2012  3:29 AM  Indication: Fracture of  L distal radius The patient has expressed understanding of the procedure being preformed Risks Benefits and alternatives of the procedure were give to the patient Written Consent obtained from patient Imaging results available showing:  fracture Site Marked Yes Time out called immediately prior to procedure Patient was placed in semifowler position Medicines used Fentanyl, propofol, versed, etomidate Method used manipulation and traction Immobilization applied post reduction Post reduction radiographs partial reduction Patient tolerated procedure without any immediate  complications. Approximate time of physician involvement in procedure 25 minutes  Well tolerated by patient.  The pt had minimal sedation with propofol, versed and fentanyl, etomidate was given later and worked much better.  Hand surgeon informed - Dr. Merlyn Lot to see in office.        Vida Roller, MD 11/30/12 [phone]

## 2012-11-30 NOTE — Anesthesia Postprocedure Evaluation (Signed)
  Anesthesia Post-op Note  Patient: Renee [name]  Procedure(s) Performed: Procedure(s) with comments: OPEN REDUCTION INTERNAL FIXATION (ORIF) DISTAL RADIAL FRACTURE (Left) - orif left distal radius   Patient Location: PACU  Anesthesia Type:GA combined with regional for post-op pain  Level of Consciousness: awake, alert  and oriented  Airway and Oxygen Therapy: Patient Spontanous Breathing and Patient connected to face mask oxygen  Post-op Pain: mild  Post-op Assessment: Post-op Vital signs reviewed  Post-op Vital Signs: Reviewed  Complications: No apparent anesthesia complications

## 2012-12-01 NOTE — Op Note (Signed)
NAMEKIRSTINE, JACQUIN NO.:  [id number]  MEDICAL RECORD NO.:  [id number]  LOCATION:  A02C                         FACILITY:  [facility]  PHYSICIAN:  Betha Loa, MD        DATE OF BIRTH:  09-26-1975  DATE OF PROCEDURE:  11/30/2012 DATE OF DISCHARGE:  11/30/2012                              OPERATIVE REPORT   PREOPERATIVE DIAGNOSIS:  Left comminuted intra-articular distal radius fracture with open ulnar side.  POSTOPERATIVE DIAGNOSIS:  Left comminuted intra-articular distal radius fracture with open ulnar side  PROCEDURE:  Irrigation and debridement and ORIF of left open comminuted intra-articular distal radius fracture.  SURGEON:  Betha Loa, MD  ASSISTANT:  Cindee Salt, MD  ANESTHESIA:  General with regional.  IV FLUIDS:  Per anesthesia flow sheet.  ESTIMATED BLOOD LOSS:  Minimal.  COMPLICATIONS:  None.  SPECIMENS:  None.  TOURNIQUET TIME:  59 minutes.  DISPOSITION:  Stable to PACU.  INDICATIONS:  Ms. Rabold is a 37 year old female, who fell yesterday from a standing height injuring her left wrist.  She was seen at [facility] Emergency Department, where radiographs were taken revealing left comminuted intra-articular distal radius fracture.  A closed reduction was performed by the emergency department staff.  She had a wound on the ulnar side of the wrist.  She was referred to me for further care.  She had been given IV Ancef in the emergency department and started on oral antibiotics.  I discussed with Ms. Riederer, the nature of the injury.  I recommended irrigation and debridement and open reduction and internal fixation in the operating room.  Risks, benefits, and alternatives of surgery were discussed including risk of blood loss, infection, damage to nerves, vessels, tendons, ligaments, bone; failure of surgery; need for additional surgery, complications with wound healing, continued pain, nonunion, malunion, stiffness.  She voiced understanding  of these risks and elected to proceed.  OPERATIVE COURSE:  After being identified preoperatively by myself, the patient and I agreed upon procedure and site of procedure.  Surgical site was marked.  The risks, benefits, and alternatives of surgery were reviewed and she wished to proceed.  Surgical consent had been signed. She was given vancomycin as preoperative antibiotic prophylaxis.  She was transferred to the operating room and placed on the operating table in supine position with the left upper extremity on arm board.  A regional block had been performed by Anesthesia in preoperative holding. General anesthesia was induced by anesthesiologist in the operating room.  The left upper extremity was prepped and draped in normal sterile orthopedic fashion.  A surgical pause was performed between surgeons, anesthesia, and operating staff, and all were in agreement as to the patient, procedure, and site of procedure.  Tourniquet at the proximal aspect of the extremity was inflated to 250 mmHg after exsanguination of the limb with Esmarch bandage.  The wound on the ulnar side of the wrist was extended both proximally and distally.  There was no gross contamination.  It was copiously irrigated with sterile saline.  The surgical portion of this wound was closed at end of the case with 4-0 nylon in horizontal mattress fashion.  Attention was turned to  the distal radius.  A standard volar Sherilyn Cooter approach was used.  Bipolar electrocautery was used in the subcutaneous tissues to obtain hemostasis.  The superficial and deep portions of the FCR tendon sheath were incised and the FCR and FPL swept ulnarly to protect the palmar cutaneous branch of the median nerve.  There was significant tearing of the pronator quadratus muscle which was released and elevated from the radius.  The brachioradialis was released.  The fracture site was easily identified.  It was comminuted intra-articularly.  It was  reduced under direct visualization.  A standard volar distal radial locking plate from the [employer]umed set was selected.  The C-arm was used in AP and lateral projections to aid in positioning of the plate.  Near anatomic reduction had been obtained.  The plate was secured to the bone with the guide pins.  A single nonlocking screw was placed in the slotted hole in the shaft of the plate.  The distal holes were then filled using standard AO drilling measuring technique.  Locking pegs were used at the radial styloid holes which were filled with locking screws.  The C-arm was used in AP, lateral, and oblique projections throughout the case to ensure appropriate reduction and position of hardware which was the case. There was no intra-articular penetration.  Radial length inclination and neutral volar tilt had been restored.  The remaining 2 holes in the shaft of the plate were filled again using standard AO drilling measuring technique.  Nonlocking screws were used.  Very good purchase was obtained in the proximal screw and moderate purchase obtained in the distal 2 screws.  The forearm was placed through range of motion and the distal radioulnar joint was stable to shuck testing in both pronation and supination.  The distal radial fragments wanted to slide back on the plate slightly and this was prevented if the wrist was placed in slight flexion.  The wound was copiously irrigated with sterile saline.  The pronator quadratus was repaired back over top of the plate as best possible using 4-0 Vicryl suture.  Vicryl suture was used in an inverted interrupted fashion.  Subcutaneous tissues and skin closed with 4-0 nylon a horizontal mattress fashion.  Wound was then dressed with sterile Xeroform, 4x4s, and wrapped with a Kerlix bandage.  A volar and dorsal slab splint was placed with the wrist flexed at 20-30 degrees. This was wrapped with Kerlix and Ace bandage.  Tourniquet was deflated at 59  minutes.  Fingertips were pink with brisk capillary refill after deflation of tourniquet.  The operative drapes were broken down and the patient was awoken from anesthesia safely.  She was transferred back to the stretcher and taken to PACU in stable condition.  I will see her back in the office in 1 week for postoperative followup.  I will give her Percocet 5/325, 1-2 p.o. q.6 hours p.r.n. pain, dispensed #40, and she will continue with Keflex that she was given in the emergency department last night.     Betha Loa, MD     KK/MEDQ  D:  11/30/2012  T:  12/01/2012  Job:  [id number]

## 2012-12-03 ENCOUNTER — Encounter (HOSPITAL_BASED_OUTPATIENT_CLINIC_OR_DEPARTMENT_OTHER): Payer: Self-pay | Admitting: Orthopedic Surgery

## 2012-12-03 LAB — POCT I-STAT, CHEM 8
BUN: 10 mg/dL (ref 6–23)
Creatinine, Ser: 0.7 mg/dL (ref 0.50–1.10)
Glucose, Bld: 82 mg/dL (ref 70–99)
Potassium: 3.5 mEq/L (ref 3.5–5.1)
Sodium: 141 mEq/L (ref 135–145)
TCO2: 24 mmol/L (ref 0–100)

## 2013-08-14 ENCOUNTER — Other Ambulatory Visit: Payer: Self-pay | Admitting: Gastroenterology

## 2013-08-20 ENCOUNTER — Encounter (HOSPITAL_COMMUNITY): Payer: Self-pay | Admitting: Pharmacy Technician

## 2013-08-20 ENCOUNTER — Encounter (HOSPITAL_COMMUNITY): Payer: Self-pay | Admitting: *Deleted

## 2013-08-20 NOTE — Progress Notes (Signed)
Per Dr. Council Mechanicenenny, inform patient to STOP Phentermine today for procedure 08/29/2013. Patient informed to stop Phentermine today with patient verbalizing understanding.

## 2013-09-05 ENCOUNTER — Encounter (HOSPITAL_COMMUNITY)
Admission: RE | Disposition: A | Discharge status: Home / Self Care | Payer: Self-pay | Source: Ambulatory Visit | Attending: Gastroenterology

## 2013-09-05 ENCOUNTER — Encounter (HOSPITAL_COMMUNITY): Payer: Self-pay | Admitting: *Deleted

## 2013-09-05 ENCOUNTER — Ambulatory Visit (HOSPITAL_COMMUNITY)
Admission: RE | Admit: 2013-09-05 | Discharge: 2013-09-05 | Disposition: A | Discharge status: Home / Self Care | Payer: BC Managed Care – PPO | Source: Ambulatory Visit | Attending: Gastroenterology | Admitting: Gastroenterology

## 2013-09-05 ENCOUNTER — Encounter (HOSPITAL_COMMUNITY): Payer: BC Managed Care – PPO | Admitting: Certified Registered"

## 2013-09-05 ENCOUNTER — Ambulatory Visit (HOSPITAL_COMMUNITY): Payer: BC Managed Care – PPO | Admitting: Certified Registered"

## 2013-09-05 DIAGNOSIS — E039 Hypothyroidism, unspecified: Secondary | ICD-10-CM | POA: Insufficient documentation

## 2013-09-05 DIAGNOSIS — R197 Diarrhea, unspecified: Secondary | ICD-10-CM | POA: Diagnosis present

## 2013-09-05 DIAGNOSIS — IMO0002 Reserved for concepts with insufficient information to code with codable children: Secondary | ICD-10-CM | POA: Insufficient documentation

## 2013-09-05 DIAGNOSIS — I10 Essential (primary) hypertension: Secondary | ICD-10-CM | POA: Diagnosis not present

## 2013-09-05 DIAGNOSIS — F329 Major depressive disorder, single episode, unspecified: Secondary | ICD-10-CM | POA: Diagnosis not present

## 2013-09-05 DIAGNOSIS — Z9884 Bariatric surgery status: Secondary | ICD-10-CM | POA: Insufficient documentation

## 2013-09-05 DIAGNOSIS — F3289 Other specified depressive episodes: Secondary | ICD-10-CM | POA: Diagnosis not present

## 2013-09-05 DIAGNOSIS — Z888 Allergy status to other drugs, medicaments and biological substances status: Secondary | ICD-10-CM | POA: Insufficient documentation

## 2013-09-05 DIAGNOSIS — Z885 Allergy status to narcotic agent status: Secondary | ICD-10-CM | POA: Diagnosis not present

## 2013-09-05 DIAGNOSIS — K219 Gastro-esophageal reflux disease without esophagitis: Secondary | ICD-10-CM | POA: Insufficient documentation

## 2013-09-05 DIAGNOSIS — Z880 Allergy status to penicillin: Secondary | ICD-10-CM | POA: Insufficient documentation

## 2013-09-05 HISTORY — DX: Dermatitis, unspecified: L30.9

## 2013-09-05 HISTORY — DX: Anemia, unspecified: D64.9

## 2013-09-05 HISTORY — PX: COLONOSCOPY WITH PROPOFOL: SHX5780

## 2013-09-05 HISTORY — DX: Gastro-esophageal reflux disease without esophagitis: K21.9

## 2013-09-05 LAB — BASIC METABOLIC PANEL
Anion gap: 12 (ref 5–15)
BUN: 8 mg/dL (ref 6–23)
CALCIUM: 9.6 mg/dL (ref 8.4–10.5)
CO2: 24 mEq/L (ref 19–32)
CREATININE: 0.64 mg/dL (ref 0.50–1.10)
Chloride: 105 mEq/L (ref 96–112)
GFR calc Af Amer: >90 mL/min (ref >90)
Glucose, Bld: 91 mg/dL (ref 70–99)
Potassium: 3.9 mEq/L (ref 3.7–5.3)
Sodium: 141 mEq/L (ref 137–147)

## 2013-09-05 LAB — CBC
HEMATOCRIT: 42.8 % (ref 36.0–46.0)
Hemoglobin: 14.5 g/dL (ref 12.0–15.0)
MCH: 32.4 pg (ref 26.0–34.0)
MCHC: 33.9 g/dL (ref 30.0–36.0)
MCV: 95.7 fL (ref 78.0–100.0)
Platelets: 241 10*3/uL (ref 150–400)
RBC: 4.47 MIL/uL (ref 3.87–5.11)
RDW: 12.9 % (ref 11.5–15.5)
WBC: 7.8 10*3/uL (ref 4.0–10.5)

## 2013-09-05 SURGERY — COLONOSCOPY WITH PROPOFOL
Anesthesia: Monitor Anesthesia Care

## 2013-09-05 MED ORDER — SODIUM CHLORIDE 0.9 % IV SOLN: INTRAVENOUS | Status: DC

## 2013-09-05 MED ORDER — PROPOFOL 10 MG/ML IV BOLUS
INTRAVENOUS | Status: AC
  Filled 2013-09-05: qty 20

## 2013-09-05 MED ORDER — LIDOCAINE HCL (CARDIAC) 20 MG/ML IV SOLN
INTRAVENOUS | Status: DC | PRN
  Administered 2013-09-05 (×2): 50 mg via INTRAVENOUS

## 2013-09-05 MED ORDER — LACTATED RINGERS IV SOLN
INTRAVENOUS | Status: DC | PRN
  Administered 2013-09-05: 13:00:00 via INTRAVENOUS

## 2013-09-05 MED ORDER — PROPOFOL INFUSION 10 MG/ML OPTIME
INTRAVENOUS | Status: DC | PRN
  Administered 2013-09-05: 140 ug/kg/min via INTRAVENOUS

## 2013-09-05 MED ORDER — LACTATED RINGERS IV SOLN: INTRAVENOUS | Status: DC

## 2013-09-05 MED ORDER — PROPOFOL 10 MG/ML IV BOLUS
INTRAVENOUS | Status: DC | PRN
  Administered 2013-09-05 (×3): 50 mg via INTRAVENOUS

## 2013-09-05 SURGICAL SUPPLY — 21 items

## 2013-09-05 NOTE — H&P (Signed)
  Problem: Resolved hematochezia  History: The patient is a 38 year old female born 12/27/1975. She has [facility]  The patient recently pass fresh blood with blood clots associated with abdominal. She has had no recurrent hematochezia or abdominal pain. Her hemoglobin in the office was 16.2 g  The patient is scheduled to undergo a diagnostic colonoscopy.  Past medical history: Gastric bypass surgery. Surgery to remove a hematoma of the left calf. Tonsillectomy. Left distal radial fracture surgery. Degenerative disc disease. Depression. Hypertension. Hypothyroidism. Gastroesophageal reflux.  Allergies: Penicillin. Codeine. Allopurinol.  Exam: The patient is alert and lying comfortably on the endoscopy stretcher. Lungs are clear to auscultation. Cardiac exam reveals a regular rhythm. Abdomen is soft and nontender to palpation.  Plan: Proceed with diagnostic colonoscopy following a bout of hematochezia.

## 2013-09-05 NOTE — Transfer of Care (Signed)
Immediate Anesthesia Transfer of Care Note  Patient: Renee CostaKathryn [name]  Procedure(s) Performed: Procedure(s): COLONOSCOPY WITH PROPOFOL (N/A)  Patient Location: PACU  Anesthesia Type:MAC  Level of Consciousness: awake, alert  and oriented  Airway & Oxygen Therapy: Patient Spontanous Breathing and Patient connected to face mask oxygen  Post-op Assessment: Report given to PACU RN and Post -op Vital signs reviewed and stable  Post vital signs: Reviewed and stable  Complications: No apparent anesthesia complications

## 2013-09-05 NOTE — Discharge Instructions (Signed)
Colonoscopy, Care After °These instructions give you information on caring for yourself after your procedure. Your doctor may also give you more specific instructions. Call your doctor if you have any problems or questions after your procedure. °HOME CARE °· Do not drive for 24 hours. °· Do not sign important papers or use machinery for 24 hours. °· You may shower. °· You may go back to your usual activities, but go slower for the first 24 hours. °· Take rest breaks often during the first 24 hours. °· Walk around or use warm packs on your belly (abdomen) if you have belly cramping or gas. °· Drink enough fluids to keep your pee (urine) clear or pale yellow. °· Resume your normal diet. Avoid heavy or fried foods. °· Avoid drinking alcohol for 24 hours or as told by your doctor. °· Only take medicines as told by your doctor. °If a tissue sample (biopsy) was taken during the procedure:  °· Do not take aspirin or blood thinners for 7 days, or as told by your doctor. °· Do not drink alcohol for 7 days, or as told by your doctor. °· Eat soft foods for the first 24 hours. °GET HELP IF: °You still have a small amount of blood in your poop (stool) 2-3 days after the procedure. °GET HELP RIGHT AWAY IF: °· You have more than a small amount of blood in your poop. °· You see clumps of tissue (blood clots) in your poop. °· Your belly is puffy (swollen). °· You feel sick to your stomach (nauseous) or throw up (vomit). °· You have a fever. °· You have belly pain that gets worse and medicine does not help. °MAKE SURE YOU: °· Understand these instructions. °· Will watch your condition. °· Will get help right away if you are not doing well or get worse. °Document Released: 02/12/2010 Document Revised: 01/15/2013 Document Reviewed: 09/17/2012 °Exit[employer]® Patient Information ©2015 [employer]. This information is not intended to replace advice given to you by your health care provider. Make sure you discuss any questions you have with  your health care provider. ° °

## 2013-09-05 NOTE — Anesthesia Preprocedure Evaluation (Addendum)
Anesthesia Evaluation  Patient identified by MRN, date of birth, ID band Patient awake    Reviewed: Allergy & Precautions, H&P , NPO status , Patient's Chart, lab work & pertinent test results  Airway Mallampati: II TM Distance: >3 FB Neck ROM: Full    Dental no notable dental hx.    Pulmonary neg pulmonary ROS,  breath sounds clear to auscultation  Pulmonary exam normal       Cardiovascular hypertension, Pt. on medications Rhythm:Regular Rate:Normal     Neuro/Psych PSYCHIATRIC DISORDERS Anxiety Depression negative neurological ROS     GI/Hepatic Neg liver ROS, GERD-  Medicated,  Endo/Other  Hypothyroidism Morbid obesity  Renal/GU negative Renal ROS     Musculoskeletal negative musculoskeletal ROS (+)   Abdominal   Peds  Hematology  (+) anemia ,   Anesthesia Other Findings   Reproductive/Obstetrics                          Anesthesia Physical Anesthesia Plan  ASA: III  Anesthesia Plan: MAC   Post-op Pain Management:    Induction: Intravenous  Airway Management Planned:   Additional Equipment:   Intra-op Plan:   Post-operative Plan:   Informed Consent: I have reviewed the patients History and Physical, chart, labs and discussed the procedure including the risks, benefits and alternatives for the proposed anesthesia with the patient or authorized representative who has indicated his/her understanding and acceptance.   Dental advisory given  Plan Discussed with: CRNA  Anesthesia Plan Comments:         Anesthesia Quick Evaluation

## 2013-09-05 NOTE — Op Note (Signed)
Problem: Resolved hematochezia and intermittent chronic watery diarrhea  Endoscopist: Danise EdgeMartin Johnson  Premedication: Propofol administered by anesthesia  Procedure: Diagnostic colonoscopy with random colon biopsies to rule out microscopic colitis The patient was placed in the left lateral decubitus position. Anal inspection and digital rectal exam were normal. The Pentax pediatric colonoscope was introduced into the rectum and advanced to the cecum. A normal-appearing appendiceal orifice and ileocecal valve were identified. Colonic preparation for the exam today was good.  Rectum. Normal. Retroflexed view of the distal rectum normal  Sigmoid colon and descending colon. Normal  Splenic flexure. Normal  Transverse colon. Normal  Hepatic flexure.. Normal  Ascending colon. Normal  Cecum and ileocecal valve. Normal  Colon biopsies. Random colon biopsies were taken from the left colon to look for microscopic  Assessment: Normal diagnostic colonoscopy. Random colon biopsies to look for microscopic colitis pending

## 2013-09-05 NOTE — Anesthesia Postprocedure Evaluation (Signed)
Anesthesia Post Note  Patient: Renee CostaKathryn Wellnitz  Procedure(s) Performed: Procedure(s) (LRB): COLONOSCOPY WITH PROPOFOL (N/A)  Anesthesia type: Spinal  Patient location: PACU  Post pain: Pain level controlled  Post assessment: Post-op Vital signs reviewed  Last Vitals: BP 193/104  Pulse 67  Temp(Src) 36.6 C (Oral)  Resp 14  Ht 5' 8.25" (1.734 m)  Wt 265 lb (120.203 kg)  BMI 39.98 kg/m2  SpO2 100%  LMP 08/13/2013  Post vital signs: Reviewed  Level of consciousness: sedated  Complications: No apparent anesthesia complications

## 2013-09-06 ENCOUNTER — Encounter (HOSPITAL_COMMUNITY): Payer: Self-pay | Admitting: Gastroenterology

## 2013-10-17 ENCOUNTER — Encounter (HOSPITAL_COMMUNITY): Payer: Self-pay | Admitting: Emergency Medicine

## 2013-10-17 ENCOUNTER — Emergency Department (HOSPITAL_COMMUNITY)
Admission: EM | Admit: 2013-10-17 | Discharge: 2013-10-18 | Disposition: A | Discharge status: Home / Self Care | Payer: BC Managed Care – PPO | Attending: Emergency Medicine | Admitting: Emergency Medicine

## 2013-10-17 DIAGNOSIS — F411 Generalized anxiety disorder: Secondary | ICD-10-CM | POA: Diagnosis not present

## 2013-10-17 DIAGNOSIS — F3289 Other specified depressive episodes: Secondary | ICD-10-CM | POA: Insufficient documentation

## 2013-10-17 DIAGNOSIS — F4310 Post-traumatic stress disorder, unspecified: Secondary | ICD-10-CM | POA: Diagnosis present

## 2013-10-17 DIAGNOSIS — Z791 Long term (current) use of non-steroidal anti-inflammatories (NSAID): Secondary | ICD-10-CM | POA: Insufficient documentation

## 2013-10-17 DIAGNOSIS — Z862 Personal history of diseases of the blood and blood-forming organs and certain disorders involving the immune mechanism: Secondary | ICD-10-CM | POA: Insufficient documentation

## 2013-10-17 DIAGNOSIS — F329 Major depressive disorder, single episode, unspecified: Secondary | ICD-10-CM | POA: Insufficient documentation

## 2013-10-17 DIAGNOSIS — E039 Hypothyroidism, unspecified: Secondary | ICD-10-CM | POA: Diagnosis not present

## 2013-10-17 DIAGNOSIS — R443 Hallucinations, unspecified: Secondary | ICD-10-CM | POA: Diagnosis present

## 2013-10-17 DIAGNOSIS — Z91040 Latex allergy status: Secondary | ICD-10-CM | POA: Diagnosis not present

## 2013-10-17 DIAGNOSIS — Z79899 Other long term (current) drug therapy: Secondary | ICD-10-CM | POA: Diagnosis not present

## 2013-10-17 DIAGNOSIS — F17200 Nicotine dependence, unspecified, uncomplicated: Secondary | ICD-10-CM | POA: Diagnosis not present

## 2013-10-17 DIAGNOSIS — K219 Gastro-esophageal reflux disease without esophagitis: Secondary | ICD-10-CM | POA: Insufficient documentation

## 2013-10-17 DIAGNOSIS — Z872 Personal history of diseases of the skin and subcutaneous tissue: Secondary | ICD-10-CM | POA: Insufficient documentation

## 2013-10-17 DIAGNOSIS — I10 Essential (primary) hypertension: Secondary | ICD-10-CM | POA: Insufficient documentation

## 2013-10-17 DIAGNOSIS — F419 Anxiety disorder, unspecified: Secondary | ICD-10-CM | POA: Diagnosis present

## 2013-10-17 DIAGNOSIS — F32A Depression, unspecified: Secondary | ICD-10-CM | POA: Diagnosis present

## 2013-10-17 DIAGNOSIS — Z880 Allergy status to penicillin: Secondary | ICD-10-CM | POA: Diagnosis not present

## 2013-10-17 LAB — COMPREHENSIVE METABOLIC PANEL
ALBUMIN: 4.1 g/dL (ref 3.5–5.2)
ALT: 27 U/L (ref 0–35)
AST: 29 U/L (ref 0–37)
Alkaline Phosphatase: 86 U/L (ref 39–117)
Anion gap: 14 (ref 5–15)
BUN: 16 mg/dL (ref 6–23)
CO2: 22 mEq/L (ref 19–32)
CREATININE: 0.87 mg/dL (ref 0.50–1.10)
Calcium: 8.9 mg/dL (ref 8.4–10.5)
Chloride: 104 mEq/L (ref 96–112)
GFR calc Af Amer: >90 mL/min (ref >90)
GFR calc non Af Amer: 84 mL/min — ABNORMAL LOW (ref >90)
Glucose, Bld: 85 mg/dL (ref 70–99)
Potassium: 3.7 mEq/L (ref 3.7–5.3)
Sodium: 140 mEq/L (ref 137–147)
Total Bilirubin: 0.5 mg/dL (ref 0.3–1.2)
Total Protein: 7.3 g/dL (ref 6.0–8.3)

## 2013-10-17 LAB — ETHANOL: Alcohol, Ethyl (B): <11 mg/dL (ref 0–11)

## 2013-10-17 LAB — RAPID URINE DRUG SCREEN, HOSP PERFORMED
Amphetamines: POSITIVE — AB
BENZODIAZEPINES: POSITIVE — AB
Barbiturates: NOT DETECTED
Cocaine: NOT DETECTED
Opiates: POSITIVE — AB
Tetrahydrocannabinol: NOT DETECTED

## 2013-10-17 LAB — SALICYLATE LEVEL

## 2013-10-17 LAB — CBC
HCT: 41.3 % (ref 36.0–46.0)
Hemoglobin: 13.9 g/dL (ref 12.0–15.0)
MCH: 32.3 pg (ref 26.0–34.0)
MCHC: 33.7 g/dL (ref 30.0–36.0)
MCV: 95.8 fL (ref 78.0–100.0)
PLATELETS: 235 10*3/uL (ref 150–400)
RBC: 4.31 MIL/uL (ref 3.87–5.11)
RDW: 13.4 % (ref 11.5–15.5)
WBC: 6.5 10*3/uL (ref 4.0–10.5)

## 2013-10-17 LAB — ACETAMINOPHEN LEVEL: Acetaminophen (Tylenol), Serum: <15 ug/mL (ref 10–30)

## 2013-10-17 MED ORDER — POTASSIUM CHLORIDE CRYS ER 20 MEQ PO TBCR
20.0000 meq | EXTENDED_RELEASE_TABLET | Freq: Two times a day (BID) | ORAL | Status: DC
  Administered 2013-10-17 – 2013-10-18 (×2): 20 meq via ORAL
  Filled 2013-10-17 (×2): qty 1

## 2013-10-17 MED ORDER — ACETAMINOPHEN 325 MG PO TABS: 650.0000 mg | ORAL_TABLET | ORAL | Status: DC | PRN

## 2013-10-17 MED ORDER — PANTOPRAZOLE SODIUM 40 MG PO TBEC
40.0000 mg | DELAYED_RELEASE_TABLET | Freq: Every day | ORAL | Status: DC
  Administered 2013-10-17 – 2013-10-18 (×2): 40 mg via ORAL
  Filled 2013-10-17 (×2): qty 1

## 2013-10-17 MED ORDER — LEVOTHYROXINE SODIUM 25 MCG PO TABS
25.0000 ug | ORAL_TABLET | Freq: Every day | ORAL | Status: DC
  Administered 2013-10-18: 25 ug via ORAL
  Filled 2013-10-17 (×2): qty 1

## 2013-10-17 MED ORDER — CARISOPRODOL 350 MG PO TABS
350.0000 mg | ORAL_TABLET | Freq: Four times a day (QID) | ORAL | Status: DC | PRN
Start: 2013-10-17 — End: 2013-10-18
  Administered 2013-10-17 – 2013-10-18 (×2): 350 mg via ORAL
  Filled 2013-10-17 (×2): qty 1

## 2013-10-17 MED ORDER — DESVENLAFAXINE ER 50 MG PO TB24: 50.0000 mg | ORAL_TABLET | Freq: Every morning | ORAL | Status: DC

## 2013-10-17 MED ORDER — FUROSEMIDE 20 MG PO TABS
20.0000 mg | ORAL_TABLET | Freq: Two times a day (BID) | ORAL | Status: DC
  Administered 2013-10-18: 20 mg via ORAL
  Filled 2013-10-17 (×3): qty 1

## 2013-10-17 MED ORDER — LEVOTHYROXINE SODIUM 200 MCG PO TABS
200.0000 ug | ORAL_TABLET | Freq: Every day | ORAL | Status: DC
  Administered 2013-10-18: 200 ug via ORAL
  Filled 2013-10-17 (×2): qty 1

## 2013-10-17 MED ORDER — VENLAFAXINE HCL ER 75 MG PO CP24
75.0000 mg | ORAL_CAPSULE | Freq: Every day | ORAL | Status: DC
  Administered 2013-10-18: 75 mg via ORAL
  Filled 2013-10-17 (×2): qty 1

## 2013-10-17 MED ORDER — AMPHETAMINE-DEXTROAMPHETAMINE 10 MG PO TABS: 30.0000 mg | ORAL_TABLET | Freq: Two times a day (BID) | ORAL | Status: DC | PRN

## 2013-10-17 MED ORDER — FEBUXOSTAT 40 MG PO TABS
40.0000 mg | ORAL_TABLET | Freq: Every day | ORAL | Status: DC
  Administered 2013-10-18: 40 mg via ORAL
  Filled 2013-10-17: qty 1

## 2013-10-17 MED ORDER — HYDROXYZINE HCL 25 MG PO TABS
50.0000 mg | ORAL_TABLET | Freq: Every day | ORAL | Status: DC
  Administered 2013-10-17: 50 mg via ORAL
  Filled 2013-10-17: qty 2

## 2013-10-17 MED ORDER — INDOMETHACIN 50 MG PO CAPS
50.0000 mg | ORAL_CAPSULE | Freq: Every day | ORAL | Status: DC | PRN
  Filled 2013-10-17: qty 1

## 2013-10-17 MED ORDER — AMPHETAMINE-DEXTROAMPHETAMINE 30 MG PO TABS
30.0000 mg | ORAL_TABLET | Freq: Two times a day (BID) | ORAL | Status: DC | PRN
Start: 2013-10-17 — End: 2013-10-17
  Filled 2013-10-17: qty 1

## 2013-10-17 MED ORDER — ALPRAZOLAM 1 MG PO TABS
1.0000 mg | ORAL_TABLET | Freq: Three times a day (TID) | ORAL | Status: DC | PRN
  Administered 2013-10-17: 1 mg via ORAL
  Filled 2013-10-17: qty 1

## 2013-10-17 MED ORDER — COLCHICINE 0.6 MG PO TABS
0.6000 mg | ORAL_TABLET | Freq: Two times a day (BID) | ORAL | Status: DC
Start: 2013-10-17 — End: 2013-10-18
  Administered 2013-10-17 – 2013-10-18 (×2): 0.6 mg via ORAL
  Filled 2013-10-17 (×4): qty 1

## 2013-10-17 MED ORDER — ALPRAZOLAM 1 MG PO TABS: 1.0000 mg | ORAL_TABLET | ORAL | Status: DC

## 2013-10-17 MED ORDER — MELOXICAM 7.5 MG PO TABS
7.5000 mg | ORAL_TABLET | Freq: Every day | ORAL | Status: DC
  Administered 2013-10-18: 7.5 mg via ORAL
  Filled 2013-10-17: qty 1

## 2013-10-17 NOTE — ED Notes (Signed)
Pt has been having auditory hallucinations since May.  Pt has been having visual hallucination as of one night could hear something going up and down the hallway and when she tried to get her room mate to open her door and confirm it was something and not her auditory hallucinations like she has had in the past. So pt states that she went outside and was looking one the sides of the house that could be causing the noise.  She saw the light was on inside her roomate's window and was looking in and saw her roommate masturbating  and turning and looking at the door as is the pt would be walking in.  Pt went back in house and stated to her outside the door that she couldn't believe she wouldn't help her bc she knew what she was doing inside the room.  Pt stated that she talked to her roommate day or so later and the room mate denies the whole situation. Pt unsure if that was hallucinations or not.   Pt will here low voices like sports commentary.  Pt states that the hallucinations say nothing specifically to her expect once "fucking chicken".   Pt states that she doesn't use illegal drugs.  Pt denies SI/HI.

## 2013-10-17 NOTE — ED Notes (Signed)
Patient is restless with rapid speech and FOI. Cooperative. Rates anxiety 9/10 and feelings of depression 7/10. Denies SI, HI, AVH at present. States she was hearing music at home prior to coming to the hospital.   Encouragement offered. Given sandwich.  Q 15 safety checks in place.

## 2013-10-17 NOTE — ED Notes (Signed)
Pt's belongings and meds given to pt's sister Ascencion Stegner

## 2013-10-17 NOTE — ED Notes (Signed)
Pt. admitted to [facility] with positive AH. Pt. Denied  that the voices are command hallucinations but rather they are music "songs from [facility]" Pt. Alert, oriented to self, place, time and situation. Cooperative with initial assessment. Reported being depressed since brother's death approx. 4years ago and her dog died last 28-Jun-2022, which has increased her depressive mood. Pt's vitals WNL. Denied pain when assessed. Contracts for safety while in facility. Stated "I need help, don't trust myself anymore, I need trained people to help me". Safety maintained on Q 15 mins. obs. as per order. Encouragement and support offered.
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
## 2013-10-18 ENCOUNTER — Encounter (HOSPITAL_COMMUNITY): Payer: Self-pay | Admitting: Psychiatry

## 2013-10-18 DIAGNOSIS — F3289 Other specified depressive episodes: Secondary | ICD-10-CM

## 2013-10-18 DIAGNOSIS — F411 Generalized anxiety disorder: Secondary | ICD-10-CM

## 2013-10-18 DIAGNOSIS — F329 Major depressive disorder, single episode, unspecified: Secondary | ICD-10-CM

## 2013-10-18 NOTE — Consult Note (Signed)
Renee [name] Face-to-Face Psychiatry Consult   Reason for Consult:  Hallucinations Referring Physician:  EDP  Renee [name] is an 38 y.o. female. Total Time spent with patient: 20 minutes  Assessment: AXIS I:  Anxiety Disorder NOS and Depressive Disorder NOS AXIS II:  Deferred AXIS III:   Past Medical History  Diagnosis Date  . Hypothyroidism   . Hypertension   . Anxiety and depression     followed by Dr. Buddy [name] and Renee [name] at [facility]  . ADD (attention deficit disorder with hyperactivity)   . GERD (gastroesophageal reflux disease)   . Anxiety   . Depression   . Anemia   . Dermatitis    AXIS IV:  other psychosocial or environmental problems, problems related to social environment and problems with primary support group AXIS V:  61-70 mild symptoms  Plan:  No evidence of imminent risk to self or others at present.  Dr. Darleene [name] assessed the patient and concurs with the plan.  Subjective:   Renee [name] is a 38 y.o. female patient does not warrant admission.  HPI:  Patient had been frustrated with multiple life stressors,especially her relationship issues.  Two nights ago she thought she saw her girlfriend was masturbating but her girlfriend told her she wasn't.  Renee [name] states she has heard music in the past during stressful periods.  She is currently taking 30 mg of Adderall daily TID along with Xanax 1 mg PRN up to six times a day, Pristiq 50 mg daily for depression.  The stress and high dosage of Adderall are most likely causing visual/audiory hallucinations, if she was having them.  Today, she stated she was calm and rested with no hallucinations, suicidal/homicidal ideations, and alcohol/drug use.  She is scheduled for IOP at [facility] on 10/7 but wanted her medications re-evaluated for side effects.  Dr. Darleene [name] suggested she communicate to her psychiatrist, Dr. Toy [name], to reduce her Adderall (not in school) and Xanax but increase her Pristiq. HPI Elements:   Location:   generalized. Quality:  acute. Severity:  mild. Timing:  brief. Duration:  brief. Context:  stressors.  Past Psychiatric History: Past Medical History  Diagnosis Date  . Hypothyroidism   . Hypertension   . Anxiety and depression     followed by Dr. Buddy [name] and Renee [name] at [facility]  . ADD (attention deficit disorder with hyperactivity)   . GERD (gastroesophageal reflux disease)   . Anxiety   . Depression   . Anemia   . Dermatitis     reports that she has been smoking Cigarettes.  She has been smoking about 0.00 packs per day. She does not have any smokeless tobacco history on file. She reports that she drinks alcohol. She reports that she does not use illicit drugs. History reviewed. No pertinent family history. Family History Substance Abuse: No Family Supports: Yes, List: Living Arrangements: Spouse/significant other (girlfriend of 4 years) Can pt return to current living arrangement?: Yes Abuse/Neglect [name]) Physical Abuse: Denies Verbal Abuse: Denies Sexual Abuse: Denies Allergies:   Allergies  Allergen Reactions  . Morphine And Related Hives and Rash  . Penicillins Hives and Rash  . Diphenhydramine     "Has opposite effect"   . Latex Dermatitis    Paper tape ok   . Allopurinol Hives and Rash    ACT Assessment Complete:  Yes:    Educational Status    Risk to Self: Risk to self with the past 6 months Suicidal Ideation: No Suicidal Intent: No Is patient at risk for  suicide?: No Suicidal Plan?: No Access to Means: No What has been your use of drugs/alcohol within the last 12 months?: none Previous Attempts/Gestures: No How many times?: 0 Other Self Harm Risks: none Triggers for Past Attempts:  (n/a) Intentional Self Injurious Behavior: None Family Suicide History: No Recent stressful life event(s): Recent negative physical changes;Other (Comment) (weight gain, loss of mobility, skin disorder, AH) Persecutory voices/beliefs?: No Depression:  No Depression Symptoms: Loss of interest in usual pleasures;Feeling angry/irritable;Feeling worthless/self pity Substance abuse history and/or treatment for substance abuse?: Yes Suicide prevention information given to non-admitted patients: Not applicable  Risk to Others: Risk to Others within the past 6 months Homicidal Ideation: No Thoughts of Harm to Others: No Current Homicidal Intent: No Current Homicidal Plan: No Access to Homicidal Means: No Identified Victim: none History of harm to others?: No Assessment of Violence: None Noted Violent Behavior Description: pt denies hx violence - is calm and polite Does patient have access to weapons?: No Criminal Charges Pending?: No Does patient have a court date: No  Abuse: Abuse/Neglect Assessment (Assessment to be complete while patient is alone) Physical Abuse: Denies Verbal Abuse: Denies Sexual Abuse: Denies Exploitation of patient/patient's resources: Denies Self-Neglect: Denies  Prior Inpatient Therapy: Prior Inpatient Therapy Prior Inpatient Therapy: No Prior Therapy Dates: na Prior Therapy Facilty/Provider(s): na Reason for Treatment: na  Prior Outpatient Therapy: Prior Outpatient Therapy Prior Outpatient Therapy: Yes Prior Therapy Dates: currently Prior Therapy Facilty/Provider(s): Dr Renee [name] & Renee [name] Reason for Treatment: med management & therapy  Additional Information: Additional Information 1:1 In Past 12 Months?: No CIRT Risk: No Elopement Risk: No Does patient have medical clearance?: Yes                  Objective: Blood pressure 117/88, pulse 73, temperature 98.7 F (37.1 C), temperature source Oral, resp. rate 17, SpO2 99.00%.There is no weight on file to calculate BMI. Results for orders placed during the hospital encounter of 10/17/13 (from the past 72 hour(s))  ACETAMINOPHEN LEVEL     Status: None   Collection Time    10/17/13  5:03 PM      Result Value Ref Range   Acetaminophen  (Tylenol), Serum <15.0  10 - 30 ug/mL   Comment:            THERAPEUTIC CONCENTRATIONS VARY     SIGNIFICANTLY. A RANGE OF 10-30     ug/mL MAY BE AN EFFECTIVE     CONCENTRATION FOR MANY PATIENTS.     HOWEVER, SOME ARE BEST TREATED     AT CONCENTRATIONS OUTSIDE THIS     RANGE.     ACETAMINOPHEN CONCENTRATIONS     >150 ug/mL AT 4 HOURS AFTER     INGESTION AND >50 ug/mL AT 12     HOURS AFTER INGESTION ARE     OFTEN ASSOCIATED WITH TOXIC     REACTIONS.  CBC     Status: None   Collection Time    10/17/13  5:03 PM      Result Value Ref Range   WBC 6.5  4.0 - 10.5 K/uL   RBC 4.31  3.87 - 5.11 MIL/uL   Hemoglobin 13.9  12.0 - 15.0 g/dL   HCT 41.3  36.0 - 46.0 %   MCV 95.8  78.0 - 100.0 fL   MCH 32.3  26.0 - 34.0 pg   MCHC 33.7  30.0 - 36.0 g/dL   RDW 13.4  11.5 - 15.5 %   Platelets 235  150 - 400 K/uL  COMPREHENSIVE METABOLIC PANEL     Status: Abnormal   Collection Time    10/17/13  5:03 PM      Result Value Ref Range   Sodium 140  137 - 147 mEq/L   Potassium 3.7  3.7 - 5.3 mEq/L   Chloride 104  96 - 112 mEq/L   CO2 22  19 - 32 mEq/L   Glucose, Bld 85  70 - 99 mg/dL   BUN 16  6 - 23 mg/dL   Creatinine, Ser 0.87  0.50 - 1.10 mg/dL   Calcium 8.9  8.4 - 10.5 mg/dL   Total Protein 7.3  6.0 - 8.3 g/dL   Albumin 4.1  3.5 - 5.2 g/dL   AST 29  0 - 37 U/L   ALT 27  0 - 35 U/L   Alkaline Phosphatase 86  39 - 117 U/L   Total Bilirubin 0.5  0.3 - 1.2 mg/dL   GFR calc non Af Amer 84 (*) >90 mL/min   GFR calc Af Amer >90  >90 mL/min   Comment: (NOTE)     The eGFR has been calculated using the CKD EPI equation.     This calculation has not been validated in all clinical situations.     eGFR's persistently <90 mL/min signify possible Chronic Kidney     Disease.   Anion gap 14  5 - 15  ETHANOL     Status: None   Collection Time    10/17/13  5:03 PM      Result Value Ref Range   Alcohol, Ethyl (B) <11  0 - 11 mg/dL   Comment:            LOWEST DETECTABLE LIMIT FOR     SERUM ALCOHOL  IS 11 mg/dL     FOR MEDICAL PURPOSES ONLY  SALICYLATE LEVEL     Status: Abnormal   Collection Time    10/17/13  5:03 PM      Result Value Ref Range   Salicylate Lvl <9.3 (*) 2.8 - 20.0 mg/dL  URINE RAPID DRUG SCREEN (HOSP PERFORMED)     Status: Abnormal   Collection Time    10/17/13  5:19 PM      Result Value Ref Range   Opiates POSITIVE (*) NONE DETECTED   Cocaine NONE DETECTED  NONE DETECTED   Benzodiazepines POSITIVE (*) NONE DETECTED   Amphetamines POSITIVE (*) NONE DETECTED   Tetrahydrocannabinol NONE DETECTED  NONE DETECTED   Barbiturates NONE DETECTED  NONE DETECTED   Comment:            DRUG SCREEN FOR MEDICAL PURPOSES     ONLY.  IF CONFIRMATION IS NEEDED     FOR ANY PURPOSE, NOTIFY LAB     WITHIN 5 DAYS.                LOWEST DETECTABLE LIMITS     FOR URINE DRUG SCREEN     Drug Class       Cutoff (ng/mL)     Amphetamine      1000     Barbiturate      200     Benzodiazepine   818     Tricyclics       299     Opiates          300     Cocaine          300     THC  50   Labs are reviewed and are pertinent for no medical issues noted.  Current Facility-Administered Medications  Medication Dose Route Frequency Provider Last Rate Last Dose  . acetaminophen (TYLENOL) tablet 650 mg  650 mg Oral Q4H PRN Malvin Johns, MD      . carisoprodol (SOMA) tablet 350 mg  350 mg Oral QID PRN Malvin Johns, MD   350 mg at 10/18/13 0955  . colchicine tablet 0.6 mg  0.6 mg Oral BID Malvin Johns, MD   0.6 mg at 10/18/13 0939  . febuxostat (ULORIC) tablet 40 mg  40 mg Oral Daily Malvin Johns, MD   40 mg at 10/18/13 0940  . furosemide (LASIX) tablet 20 mg  20 mg Oral BID Malvin Johns, MD   20 mg at 10/18/13 0739  . hydrOXYzine (ATARAX/VISTARIL) tablet 50 mg  50 mg Oral QHS Malvin Johns, MD   50 mg at 10/17/13 2219  . indomethacin (INDOCIN) capsule 50 mg  50 mg Oral Daily PRN Malvin Johns, MD      . levothyroxine (SYNTHROID, LEVOTHROID) tablet 200 mcg  200 mcg Oral QAC  breakfast Malvin Johns, MD   200 mcg at 10/18/13 0739  . levothyroxine (SYNTHROID, LEVOTHROID) tablet 25 mcg  25 mcg Oral QAC breakfast Malvin Johns, MD   25 mcg at 10/18/13 0741  . meloxicam (MOBIC) tablet 7.5 mg  7.5 mg Oral Daily Malvin Johns, MD   7.5 mg at 10/18/13 0940  . pantoprazole (PROTONIX) EC tablet 40 mg  40 mg Oral Daily Malvin Johns, MD   40 mg at 10/18/13 0739  . potassium chloride SA (K-DUR,KLOR-CON) CR tablet 20 mEq  20 mEq Oral BID Malvin Johns, MD   20 mEq at 10/18/13 0739  . venlafaxine XR (EFFEXOR-XR) 24 hr capsule 75 mg  75 mg Oral Q breakfast Malvin Johns, MD   75 mg at 10/18/13 7262   Current Outpatient Prescriptions  Medication Sig Dispense Refill  . acetaminophen (TYLENOL) 500 MG tablet Take 1,000 mg by mouth every 6 (six) hours as needed (Pain).      Marland Kitchen ALPRAZolam (XANAX) 1 MG tablet Take 1 mg by mouth See admin instructions. 6 times daily as needed for anxiety      . amphetamine-dextroamphetamine (ADDERALL) 30 MG tablet Take 30 mg by mouth 2 (two) times daily as needed (for add).       . Calcium Citrate-Vitamin D (CALCIUM CITRATE + D3 PO) Take 2 tablets by mouth 3 (three) times daily.      . carisoprodol (SOMA) 350 MG tablet Take 350 mg by mouth 4 (four) times daily as needed for muscle spasms.      . cetirizine (ZYRTEC) 10 MG tablet Take 10 mg by mouth at bedtime.       . colchicine 0.6 MG tablet Take 0.6 mg by mouth 2 (two) times daily.      . Cyanocobalamin (VITAMIN B-12 PO) Take 500 mcg by mouth every other day.       Marland Kitchen Desvenlafaxine ER (PRISTIQ) 50 MG TB24 Take 50 mg by mouth every morning.      . diphenhydrAMINE (BENADRYL) 25 mg capsule Take 25 mg by mouth 2 (two) times daily as needed for itching or allergies.       . febuxostat (ULORIC) 40 MG tablet Take 40 mg by mouth daily.      . ferrous fumarate (HEMOCYTE - 106 MG FE) 325 (106 FE) MG TABS tablet Take 1 tablet by mouth daily.      Marland Kitchen  furosemide (LASIX) 20 MG tablet Take 20 mg by mouth 2 (two) times  daily.      . hydrOXYzine (ATARAX/VISTARIL) 25 MG tablet Take 50 mg by mouth at bedtime.       . indomethacin (INDOCIN) 50 MG capsule Take 50 mg by mouth daily as needed for moderate pain.      Marland Kitchen levothyroxine (SYNTHROID, LEVOTHROID) 200 MCG tablet Take 200 mcg by mouth daily before breakfast.      . levothyroxine (SYNTHROID, LEVOTHROID) 25 MCG tablet Take 25 mcg by mouth daily before breakfast.      . Magnesium 500 MG CAPS Take 1 capsule by mouth 2 (two) times daily.       . meloxicam (MOBIC) 7.5 MG tablet Take 7.5 mg by mouth 2 (two) times daily.      . Multiple Vitamin (MULTIVITAMIN WITH MINERALS) TABS tablet Take 1 tablet by mouth daily.      . Multiple Vitamins-Minerals (HAIR/SKIN/NAILS/BIOTIN PO) Take 1 tablet by mouth 2 (two) times daily.       Marland Kitchen omeprazole (PRILOSEC) 20 MG capsule Take 20 mg by mouth 2 (two) times daily.       . ondansetron (ZOFRAN-ODT) 8 MG disintegrating tablet Take 8 mg by mouth every 8 (eight) hours as needed for nausea or vomiting.      Marland Kitchen oxyCODONE-acetaminophen (PERCOCET) 10-325 MG per tablet Take 1 tablet by mouth every 4 (four) hours as needed for pain.      . phentermine 37.5 MG capsule Take 37.5 mg by mouth daily.      . potassium chloride SA (K-DUR,KLOR-CON) 20 MEQ tablet Take 20 mEq by mouth 2 (two) times daily.        Psychiatric Specialty Exam:     Blood pressure 117/88, pulse 73, temperature 98.7 F (37.1 C), temperature source Oral, resp. rate 17, SpO2 99.00%.There is no weight on file to calculate BMI.  General Appearance: Casual  Eye Contact::  Good  Speech:  Normal Rate  Volume:  Normal  Mood:  Depressed  Affect:  Congruent  Thought Process:  Coherent  Orientation:  Full (Time, Place, and Person)  Thought Content:  Rumination  Suicidal Thoughts:  No  Homicidal Thoughts:  No  Memory:  Immediate;   Good Recent;   Good Remote;   Good  Judgement:  Fair  Insight:  Fair  Psychomotor Activity:  Normal  Concentration:  Good  Recall:  Good   Fund of Knowledge:Good  Language: Good  Akathisia:  No  Handed:  Right  AIMS (if indicated):     Assets:  Desire for Improvement Financial Resources/Insurance Housing Leisure Time [place] Talents/Skills Transportation  Sleep:      Musculoskeletal: Strength & Muscle Tone: within normal limits Gait & Station: normal Patient leans: N/A  Treatment Plan Summary: Discharge home with follow-up with her regular provider and IOP at [name] on 10/7.  Mindfulness Based Stress Reduction Booklet given and explained to the patient along with other materials to help reduce her stress.  Waylan Boga, [place] 10/18/2013 12:28 PM  Patient seen, evaluated and I agree with notes by Nurse Practitioner. Corena Pilgrim, MD

## 2013-10-18 NOTE — [facility] Suicide Risk Assessment (Signed)
Suicide Risk Assessment  Discharge Assessment     Demographic Factors:  NA  Total Time spent with patient: 20 minutes   Psychiatric Specialty Exam:     Blood pressure 117/88, pulse 73, temperature 98.7 F (37.1 C), temperature source Oral, resp. rate 17, SpO2 99.00%.There is no weight on file to calculate BMI.  General Appearance: Casual  Eye Contact::  Good  Speech:  Normal Rate  Volume:  Normal  Mood:  Depressed  Affect:  Congruent  Thought Process:  Coherent  Orientation:  Full (Time, Place, and Person)  Thought Content:  Rumination  Suicidal Thoughts:  No  Homicidal Thoughts:  No  Memory:  Immediate;   Good Recent;   Good Remote;   Good  Judgement:  Fair  Insight:  Fair  Psychomotor Activity:  Normal  Concentration:  Good  Recall:  Good  Fund of Knowledge:Good  Language: Good  Akathisia:  No  Handed:  Right  AIMS (if indicated):     Assets:  Desire for Improvement Financial Resources/Insurance Housing Leisure Time Physical Health Resilience Social Support Talents/Skills Transportation  Sleep:      Musculoskeletal: Strength & Muscle Tone: within normal limits Gait & Station: normal Patient leans: N/A  Mental Status Per Nursing Assessment::   On Admission:   depression, hallucinations  Current Mental Status by Physician: NA  Loss Factors: NA  Historical Factors: NA  Risk Reduction Factors:   Sense of responsibility to family, Living with another person, especially a relative, Positive social support, Positive therapeutic relationship and Positive coping skills or problem solving skills  Continued Clinical Symptoms:  Depression  Cognitive Features That Contribute To Risk:  None    Suicide Risk:  Minimal: No identifiable suicidal ideation.  Patients presenting with no risk factors but with morbid ruminations; may be classified as minimal risk based on the severity of the depressive symptoms  Discharge Diagnoses:   AXIS I:  Anxiety  Disorder NOS and Depressive Disorder NOS AXIS II:  Deferred AXIS III:   Past Medical History  Diagnosis Date  . Hypothyroidism   . Hypertension   . Anxiety and depression     followed by Dr. Sharl Ma and [facility] at [facility]  . ADD (attention deficit disorder with hyperactivity)   . GERD (gastroesophageal reflux disease)   . Anxiety   . Depression   . Anemia   . Dermatitis    AXIS IV:  other psychosocial or environmental problems, problems related to social environment and problems with primary support group AXIS V:  61-70 mild symptoms  Plan Of Care/Follow-up recommendations:  Activity:  as tolerated Diet:  low-sodium heart healthy diet  Is patient on multiple antipsychotic therapies at discharge:  No   Has Patient had three or more failed trials of antipsychotic monotherapy by history:  No  Recommended Plan for Multiple Antipsychotic Therapies: NA    Brizza Nathanson, PMH-NP 10/18/2013, 12:40 PM

## 2013-11-18 ENCOUNTER — Other Ambulatory Visit (HOSPITAL_COMMUNITY): Payer: BC Managed Care – PPO | Admitting: Psychiatry

## 2013-11-18 ENCOUNTER — Encounter (HOSPITAL_COMMUNITY): Payer: Self-pay

## 2013-11-18 DIAGNOSIS — Z79891 Long term (current) use of opiate analgesic: Secondary | ICD-10-CM | POA: Insufficient documentation

## 2013-11-18 DIAGNOSIS — F333 Major depressive disorder, recurrent, severe with psychotic symptoms: Secondary | ICD-10-CM

## 2013-11-18 DIAGNOSIS — G4700 Insomnia, unspecified: Secondary | ICD-10-CM | POA: Diagnosis not present

## 2013-11-18 DIAGNOSIS — Z79899 Other long term (current) drug therapy: Secondary | ICD-10-CM | POA: Insufficient documentation

## 2013-11-18 DIAGNOSIS — K219 Gastro-esophageal reflux disease without esophagitis: Secondary | ICD-10-CM | POA: Insufficient documentation

## 2013-11-18 DIAGNOSIS — F410 Panic disorder [episodic paroxysmal anxiety] without agoraphobia: Secondary | ICD-10-CM | POA: Diagnosis not present

## 2013-11-18 DIAGNOSIS — F4522 Body dysmorphic disorder: Secondary | ICD-10-CM | POA: Diagnosis not present

## 2013-11-18 DIAGNOSIS — F988 Other specified behavioral and emotional disorders with onset usually occurring in childhood and adolescence: Secondary | ICD-10-CM

## 2013-11-18 DIAGNOSIS — Z62810 Personal history of physical and sexual abuse in childhood: Secondary | ICD-10-CM | POA: Diagnosis not present

## 2013-11-18 DIAGNOSIS — F332 Major depressive disorder, recurrent severe without psychotic features: Secondary | ICD-10-CM | POA: Insufficient documentation

## 2013-11-18 DIAGNOSIS — Z609 Problem related to social environment, unspecified: Secondary | ICD-10-CM | POA: Diagnosis not present

## 2013-11-18 DIAGNOSIS — Z7952 Long term (current) use of systemic steroids: Secondary | ICD-10-CM | POA: Insufficient documentation

## 2013-11-18 DIAGNOSIS — I10 Essential (primary) hypertension: Secondary | ICD-10-CM | POA: Diagnosis not present

## 2013-11-18 DIAGNOSIS — F17210 Nicotine dependence, cigarettes, uncomplicated: Secondary | ICD-10-CM | POA: Diagnosis not present

## 2013-11-18 DIAGNOSIS — F411 Generalized anxiety disorder: Secondary | ICD-10-CM | POA: Insufficient documentation

## 2013-11-18 DIAGNOSIS — F901 Attention-deficit hyperactivity disorder, predominantly hyperactive type: Secondary | ICD-10-CM | POA: Insufficient documentation

## 2013-11-18 DIAGNOSIS — Z9884 Bariatric surgery status: Secondary | ICD-10-CM | POA: Diagnosis not present

## 2013-11-18 DIAGNOSIS — E039 Hypothyroidism, unspecified: Secondary | ICD-10-CM | POA: Diagnosis not present
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
## 2013-11-18 NOTE — Progress Notes (Signed)
    Daily Group Progress Note  Program: IOP  Group Time: 9:00-10:30  Participation Level: Active  Behavioral Response: Appropriate  Type of Therapy:  Group Therapy  Summary of Progress: Pt. Met with case manager and psychiatrist.     Group Time: 10:30-12:00  Participation Level:  Active  Behavioral Response: Appropriate  Type of Therapy: Psycho-education Group  Summary of Progress: Pt. Met with case manager and psychiatrist.  [facility] 
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
## 2013-11-19 ENCOUNTER — Other Ambulatory Visit (HOSPITAL_COMMUNITY): Payer: BC Managed Care – PPO | Attending: Psychiatry | Admitting: Psychiatry

## 2013-11-19 DIAGNOSIS — F988 Other specified behavioral and emotional disorders with onset usually occurring in childhood and adolescence: Secondary | ICD-10-CM

## 2013-11-19 DIAGNOSIS — F333 Major depressive disorder, recurrent, severe with psychotic symptoms: Secondary | ICD-10-CM

## 2013-11-19 DIAGNOSIS — F410 Panic disorder [episodic paroxysmal anxiety] without agoraphobia: Secondary | ICD-10-CM

## 2013-11-19 DIAGNOSIS — F332 Major depressive disorder, recurrent severe without psychotic features: Secondary | ICD-10-CM | POA: Diagnosis not present

## 2013-11-20 ENCOUNTER — Telehealth (HOSPITAL_COMMUNITY): Payer: Self-pay | Admitting: Psychiatry

## 2013-11-20 ENCOUNTER — Other Ambulatory Visit (HOSPITAL_COMMUNITY): Payer: BC Managed Care – PPO

## 2013-11-20 NOTE — Progress Notes (Signed)
    Daily Group Progress Note  Program: IOP  Group Time: 9:00-10:30  Participation Level: Active  Behavioral Response: Appropriate  Type of Therapy:  Group Therapy  Summary of Progress: Pt. Talked appropriately, intermittently tearful. Pt. Reported fears of needing redirection, hypo-mania and anxiety. Pt. Discussed relationship stressors and history of gastric bypass surgery and identified with history of disordered eating.      Group Time: 10:30-12:00  Participation Level:  Active  Behavioral Response: Appropriate  Type of Therapy: Psycho-education Group  Summary of Progress: Pt. Participated in discussion about self-acceptance.   Shaune PollackBrown, Jennifer B, LPC

## 2013-11-21 ENCOUNTER — Other Ambulatory Visit (HOSPITAL_COMMUNITY): Payer: BC Managed Care – PPO | Admitting: Psychiatry

## 2013-11-21 DIAGNOSIS — F332 Major depressive disorder, recurrent severe without psychotic features: Secondary | ICD-10-CM | POA: Diagnosis not present

## 2013-11-22 ENCOUNTER — Other Ambulatory Visit (HOSPITAL_COMMUNITY): Payer: BC Managed Care – PPO | Admitting: Psychiatry

## 2013-11-22 DIAGNOSIS — F988 Other specified behavioral and emotional disorders with onset usually occurring in childhood and adolescence: Secondary | ICD-10-CM

## 2013-11-22 DIAGNOSIS — F410 Panic disorder [episodic paroxysmal anxiety] without agoraphobia: Secondary | ICD-10-CM

## 2013-11-22 DIAGNOSIS — F332 Major depressive disorder, recurrent severe without psychotic features: Secondary | ICD-10-CM | POA: Diagnosis not present

## 2013-11-22 DIAGNOSIS — F333 Major depressive disorder, recurrent, severe with psychotic symptoms: Secondary | ICD-10-CM
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
## 2013-11-25 ENCOUNTER — Other Ambulatory Visit (HOSPITAL_COMMUNITY): Payer: BC Managed Care – PPO

## 2013-11-25 DIAGNOSIS — E039 Hypothyroidism, unspecified: Secondary | ICD-10-CM | POA: Insufficient documentation

## 2013-11-25 DIAGNOSIS — F4522 Body dysmorphic disorder: Secondary | ICD-10-CM | POA: Insufficient documentation

## 2013-11-25 DIAGNOSIS — Z609 Problem related to social environment, unspecified: Secondary | ICD-10-CM | POA: Insufficient documentation

## 2013-11-25 DIAGNOSIS — F411 Generalized anxiety disorder: Secondary | ICD-10-CM | POA: Insufficient documentation

## 2013-11-25 DIAGNOSIS — K219 Gastro-esophageal reflux disease without esophagitis: Secondary | ICD-10-CM | POA: Insufficient documentation

## 2013-11-25 DIAGNOSIS — F901 Attention-deficit hyperactivity disorder, predominantly hyperactive type: Secondary | ICD-10-CM | POA: Insufficient documentation

## 2013-11-25 DIAGNOSIS — I10 Essential (primary) hypertension: Secondary | ICD-10-CM | POA: Insufficient documentation

## 2013-11-25 DIAGNOSIS — F332 Major depressive disorder, recurrent severe without psychotic features: Secondary | ICD-10-CM | POA: Insufficient documentation

## 2013-11-26 ENCOUNTER — Other Ambulatory Visit (HOSPITAL_COMMUNITY): Payer: BC Managed Care – PPO

## 2013-11-27 ENCOUNTER — Other Ambulatory Visit (HOSPITAL_COMMUNITY): Payer: BC Managed Care – PPO

## 2013-11-28 ENCOUNTER — Other Ambulatory Visit (HOSPITAL_COMMUNITY): Payer: BC Managed Care – PPO

## 2013-11-29 ENCOUNTER — Other Ambulatory Visit (HOSPITAL_COMMUNITY): Payer: BC Managed Care – PPO

## 2013-12-02 ENCOUNTER — Other Ambulatory Visit (HOSPITAL_COMMUNITY): Payer: BC Managed Care – PPO | Attending: Psychiatry | Admitting: Psychiatry

## 2013-12-02 DIAGNOSIS — F4522 Body dysmorphic disorder: Secondary | ICD-10-CM | POA: Diagnosis not present

## 2013-12-02 DIAGNOSIS — F332 Major depressive disorder, recurrent severe without psychotic features: Secondary | ICD-10-CM | POA: Diagnosis not present

## 2013-12-02 DIAGNOSIS — Z609 Problem related to social environment, unspecified: Secondary | ICD-10-CM | POA: Diagnosis not present

## 2013-12-02 DIAGNOSIS — I10 Essential (primary) hypertension: Secondary | ICD-10-CM | POA: Diagnosis not present

## 2013-12-02 DIAGNOSIS — K219 Gastro-esophageal reflux disease without esophagitis: Secondary | ICD-10-CM | POA: Diagnosis not present

## 2013-12-02 DIAGNOSIS — F410 Panic disorder [episodic paroxysmal anxiety] without agoraphobia: Secondary | ICD-10-CM

## 2013-12-02 DIAGNOSIS — F411 Generalized anxiety disorder: Secondary | ICD-10-CM | POA: Diagnosis not present

## 2013-12-02 DIAGNOSIS — F901 Attention-deficit hyperactivity disorder, predominantly hyperactive type: Secondary | ICD-10-CM | POA: Diagnosis not present

## 2013-12-02 DIAGNOSIS — F333 Major depressive disorder, recurrent, severe with psychotic symptoms: Secondary | ICD-10-CM

## 2013-12-02 DIAGNOSIS — E039 Hypothyroidism, unspecified: Secondary | ICD-10-CM | POA: Diagnosis not present

## 2013-12-03 ENCOUNTER — Other Ambulatory Visit (HOSPITAL_COMMUNITY): Payer: BC Managed Care – PPO | Admitting: Psychiatry

## 2013-12-03 DIAGNOSIS — F410 Panic disorder [episodic paroxysmal anxiety] without agoraphobia: Secondary | ICD-10-CM

## 2013-12-03 DIAGNOSIS — F333 Major depressive disorder, recurrent, severe with psychotic symptoms: Secondary | ICD-10-CM

## 2013-12-03 DIAGNOSIS — F332 Major depressive disorder, recurrent severe without psychotic features: Secondary | ICD-10-CM | POA: Diagnosis not present

## 2013-12-03 NOTE — Progress Notes (Signed)
    Daily Group Progress Note  Program: IOP  Group Time: 9:00-10:30  Participation Level: Active  Behavioral Response: Appropriate  Type of Therapy:  Group Therapy  Summary of Progress: Pt. Reported financial stress, concerns about being unemployed. Pt. Feels very dependent on others and wants to work on grief related to death of her brother and anticipating the end of her relaitonship.     Group Time:  10:30-12:00  Participation Level:  Active  Behavioral Response: Appropriate  Type of Therapy: Psycho-education Group  Summary of Progress: Pt. Participated in grief and loss facilitated by Theda BelfastBob Hamilton.   Shaune PollackBrown, Jennifer B, LPC

## 2013-12-04 ENCOUNTER — Other Ambulatory Visit (HOSPITAL_COMMUNITY): Payer: BC Managed Care – PPO

## 2013-12-04 NOTE — Progress Notes (Signed)
    Daily Group Progress Note  Program: IOP  Group Time: 9:00-10:30  Participation Level: Active  Behavioral Response: Appropriate  Type of Therapy:  Group Therapy  Summary of Progress: Pt. Reported feeling strong and supported by the group process. Pt. Processed feelings of loneliness in her relationship with her partner.      Group Time: 10:30-12:00  Participation Level:  Active  Behavioral Response: Appropriate  Type of Therapy: Psycho-education Group  Summary of Progress: Pt. Participated in discussion about learning to sit with emotions and moving from "stuck" places.   Shaune PollackBrown, Jennifer B, LPC

## 2013-12-05 ENCOUNTER — Other Ambulatory Visit (HOSPITAL_COMMUNITY): Payer: BC Managed Care – PPO

## 2013-12-06 ENCOUNTER — Other Ambulatory Visit (HOSPITAL_COMMUNITY): Payer: BC Managed Care – PPO

## 2013-12-09 ENCOUNTER — Other Ambulatory Visit (HOSPITAL_COMMUNITY): Payer: BC Managed Care – PPO

## 2013-12-10 ENCOUNTER — Other Ambulatory Visit (HOSPITAL_COMMUNITY): Payer: BC Managed Care – PPO

## 2013-12-11 ENCOUNTER — Other Ambulatory Visit (HOSPITAL_COMMUNITY): Payer: BC Managed Care – PPO | Admitting: Psychiatry

## 2013-12-12 ENCOUNTER — Other Ambulatory Visit (HOSPITAL_COMMUNITY): Payer: BC Managed Care – PPO

## 2013-12-13 ENCOUNTER — Other Ambulatory Visit (HOSPITAL_COMMUNITY): Payer: BC Managed Care – PPO

## 2013-12-16 ENCOUNTER — Other Ambulatory Visit (HOSPITAL_COMMUNITY): Payer: BC Managed Care – PPO

## 2013-12-17 ENCOUNTER — Other Ambulatory Visit (HOSPITAL_COMMUNITY): Payer: BC Managed Care – PPO | Admitting: Psychiatry

## 2013-12-17 DIAGNOSIS — F410 Panic disorder [episodic paroxysmal anxiety] without agoraphobia: Secondary | ICD-10-CM

## 2013-12-17 DIAGNOSIS — F332 Major depressive disorder, recurrent severe without psychotic features: Secondary | ICD-10-CM | POA: Diagnosis not present

## 2013-12-17 NOTE — Progress Notes (Signed)
Patient ID: Bettey CostaKathryn Perritt, female   DOB: 10/26/1975, 38 y.o.   MRN: [id number] Ms Daphine DeutscherMartin talked for one hour and 15 minutes.  She outlined her current situation.  As I understand it she did the best she ever has for the 5 years after she had bariatric surgery.  For the first time in her life she felt people actually looked beyond her weight and saw her as a person.  She thought she was finally beginning to see herself as a person as well.  Then her brother got sick and died, she gained a lot of weight and requested a repeat surgery.  That was all set but for some reason at the last moment everything was cancelled.  She called the doctor's office to find out what went wrong but no one would tell her and now they have discharged her from the practice.  No other doctor will likely do the surgery because of whatever it was that caused her original doctor to drop her, she believes.  She is in limbo not knowing why she was dropped and does not know how to go about correcting whatever it was because no one will tell her what it was.  She has other issues but that was the biggest one, she says.  The groups are helping, she is no longer suicidal but is still depressed, she says.

## 2013-12-17 NOTE — Progress Notes (Signed)
    Daily Group Progress Note  Program: IOP  Group Time: 10:30-12:00  Participation Level: Active  Behavioral Response: Appropriate  Type of Therapy:  Group Therapy  Summary of Progress: Pt. Attended group for first time following illness. Pt. Continues to work on coping with disappointment regarding bariatric surgery, foot pain, tooth pain, and skin condition. Pt. Continues to demonstrate pattern of critical self-talk and lack of acceptance of self.      Group Time: 9:00-10:30  Participation Level:  Active  Behavioral Response: Appropriate  Type of Therapy: Psycho-education Group  Summary of Progress: Pt. Met with psychiatrist.   Nancie Neas, LPC

## 2013-12-18 ENCOUNTER — Other Ambulatory Visit (HOSPITAL_COMMUNITY): Payer: BC Managed Care – PPO

## 2013-12-20 ENCOUNTER — Other Ambulatory Visit (HOSPITAL_COMMUNITY): Payer: BC Managed Care – PPO

## 2013-12-23 ENCOUNTER — Other Ambulatory Visit (HOSPITAL_COMMUNITY): Payer: BC Managed Care – PPO | Admitting: Psychiatry

## 2013-12-24 ENCOUNTER — Other Ambulatory Visit (HOSPITAL_COMMUNITY): Payer: BC Managed Care – PPO | Attending: Psychiatry

## 2013-12-24 ENCOUNTER — Telehealth (HOSPITAL_COMMUNITY): Payer: Self-pay | Admitting: Psychiatry

## 2013-12-24 ENCOUNTER — Other Ambulatory Visit: Payer: Self-pay | Admitting: Physician Assistant

## 2013-12-25 ENCOUNTER — Other Ambulatory Visit (HOSPITAL_COMMUNITY): Payer: BC Managed Care – PPO

## 2013-12-26 ENCOUNTER — Other Ambulatory Visit (HOSPITAL_COMMUNITY): Payer: BC Managed Care – PPO

## 2013-12-27 ENCOUNTER — Other Ambulatory Visit (HOSPITAL_COMMUNITY): Payer: BC Managed Care – PPO | Admitting: Psychiatry

## 2014-01-07 ENCOUNTER — Telehealth (HOSPITAL_COMMUNITY): Payer: Self-pay | Admitting: Psychiatry

## 2014-01-22 NOTE — Patient Instructions (Signed)
D/C pt today due to non-compliance with attendance.  F/U with Dr. Evelene CroonKaur and Gretchen ShortBob Milan, LCSW.  Encouraged support groups.

## 2014-01-22 NOTE — Progress Notes (Signed)
  [facility]  Renee CostaKathryn [name] [id number]  Admission date: 11/18/2013 Discharge date: 01/22/2014  Reason for admission: depression and anxiety  Chemical Use History: none  Family of Origin Issues: none  Progress in Program Toward Treatment Goals: Ms Daphine DeutscherMartin had so many medical problems that she rarely attended the program.  She was very conscientious about calling and indicated the program was helpful enough that she did not want to be discharged.  However after about 3 weeks of no attendance she will be discharged.  Progress (rationale): Not suicidal via phone contacts but medical problems prevented full benefit from the program    Benjaman PottAYLOR,Kaya Klausing D, MD 01/22/2014

## 2014-01-22 NOTE — Progress Notes (Signed)
Bettey CostaKathryn [name] is a 38 y.o. single, unemployed, Caucasian female who missed several weeks due to so many medical problems. She was very conscientious about calling and indicated the program was helpful enough that she did not want to be discharged. However after about four-five weeks of no attendance; she will be discharged from [facility] today.  Pt voiced no SI/HI or A/V hallucinations (via phone contacts), but medical problems prevented full benefit from the program.  Encouraged pt to return once medical issues improved.  F/U with Dr. Evelene CroonKaur and [facility], LCSW.  R:  Pt receptive.

## 2014-01-23 ENCOUNTER — Encounter (HOSPITAL_COMMUNITY): Payer: Self-pay | Admitting: Psychiatry

## 2014-01-29 DIAGNOSIS — F332 Major depressive disorder, recurrent severe without psychotic features: Secondary | ICD-10-CM | POA: Insufficient documentation

## 2014-06-30 ENCOUNTER — Encounter: Payer: BLUE CROSS/BLUE SHIELD | Admitting: Neurology

## 2014-07-08 ENCOUNTER — Telehealth: Payer: Self-pay | Admitting: Neurology

## 2014-07-08 NOTE — Telephone Encounter (Signed)
We can't do that. She has to speak to the ordering physician. We can only provide what her physician ordered. Please let her know. thanks

## 2014-07-08 NOTE — Telephone Encounter (Signed)
Patient called and requested to speak with the nurse. She would like to know if she can have the EMG performed on her left hand too. Her referral was for right hand only and she states that she is having a lot of problems and would like to kn speak with someone regarding theses issues before she comes in on Thursday (6/16).Please call and advise.

## 2014-07-09 NOTE — Telephone Encounter (Signed)
Spoke with pt and she stated she contacted PCP to see if they would send new referral over for the left hand as well before her appt with Dr. [name] tomorrow. She spoke with Tammy from Dr. Laverle Hobby office. She said the nurse was working on it and will call her back today. She called our office to see if we received anything. I checked and we have not received anything from Dr. Laverle Hobby office. I told her I will call their office to find out the status and call her back. She verbalized understanding.

## 2014-07-09 NOTE — Telephone Encounter (Signed)
Spoke with pt to let her know we received referral for both right and left hand numbness to complete NCS/EMG tomorrow at her appt. Told pt to arrive at 9:00am. She was very thankful and verbalized understanding.

## 2014-07-09 NOTE — Telephone Encounter (Signed)
Spoke with Tammy, Dr. Pete Glatter nurse and she stated she spoke with pt this morning about adding left hand to referral to have NCS.EMG on both hands. She stated she received response from Dr. Pete Glatter and he approved this. She said she will fax over new order for bilateral hands for NCS/EMG. I received new order at 4:07pm on 07/09/14.

## 2014-07-09 NOTE — Telephone Encounter (Signed)
Patient called I relayed message to her. She was going to call her PCP Dr Alexis Goodell for referral. Patient can be reached at [phone].

## 2014-07-09 NOTE — Telephone Encounter (Signed)
Returning pt call. Left VM and gave [facility] phone number. Told her we are open until 5 pm. If she calls back, let her know she needs to contact Era Bumpers, MD who referred her to [place] and they need to send over another referral for left hand if they approve it. Thank you!

## 2014-07-10 ENCOUNTER — Ambulatory Visit (INDEPENDENT_AMBULATORY_CARE_PROVIDER_SITE_OTHER): Payer: BLUE CROSS/BLUE SHIELD | Admitting: Neurology

## 2014-07-10 ENCOUNTER — Ambulatory Visit (INDEPENDENT_AMBULATORY_CARE_PROVIDER_SITE_OTHER): Payer: Self-pay | Admitting: Neurology

## 2014-07-10 DIAGNOSIS — M79603 Pain in arm, unspecified: Secondary | ICD-10-CM

## 2014-07-10 DIAGNOSIS — M79601 Pain in right arm: Secondary | ICD-10-CM

## 2014-07-10 DIAGNOSIS — R202 Paresthesia of skin: Secondary | ICD-10-CM

## 2014-07-10 DIAGNOSIS — M79602 Pain in left arm: Principal | ICD-10-CM

## 2014-07-12 NOTE — Progress Notes (Signed)
See procedure note.

## 2014-07-13 NOTE — Progress Notes (Signed)
  [facility]    Provider:  Dr [name] Referring Provider: Merlene Laughter, MD Primary Care Physician:  Ginette Otto, MD  History:  Renee [name] is a 39 y.o. female here as a referral from Dr. Pete Glatter for right hand pain, burning and tingling of digits 1-3. PMHx of obesity s/p gastric bypass, alcohol abuse, hypothyroidism, cervical degenerative disease. She denies weakness of grip. The pain wakes her up from sleep.   Summary:   Left median APB motor nerve was within normal limits with normal F Response latency. Left 2nd-digit Median sensory nerve was within normal limits  Right Median APB motor nerve was within normal limits with normal F response latency.  Right Median 2nd digit sensory nerve was within normal limits.   Bilateral Ulnar ADM motor nerves were within normal limits with normal F response latencies.  Bilateral Ulnar 5th digit sensory nerves were within normal limits.   Bilateral Radial nerves were within normal limits.  EMG needle study showed moderately increased spontaneous activity (positive sharp waves and fibrillation potentials) in both Opponens Pollicis muscles. The right Opponens Pollicis muscle showed reduced recruitment. Needle evaluation of the following muscles were within normal limits: bilateral Deltoid, bilateral Triceps, bilateral Pronator teres, bilateral Flexor Digitorum Profundus(ulnar), bilateral Extensor Digitorum Communis, bilateral Extensor Indicis, bilateral First Dorsal Interosseous, right Flexor Pollicis Longus, right Flexor carpi radialis, right Abductor Digiti Minimi, bilateral C6/C7/C8 paraspinal muscles.   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 This is difficult to localize given otherwise normal study. Nerve conductions do not support Carpal Tunnel Syndrome, median  neuropathy or large-fiber polyneuropathy. Can't rule out C8 radiculopathy.   A small-fiber neuropathy could cause patient's symptoms and still evade detection on nerve conduction studies; patient does have at least several risk factors for this condition including hx of alcohol abuse, possible vitamin deficiency from bariatric surgery, hypothyroidism.   Suggest MRI of the cervical spine and small-fiber polyneuropathy serum workup as clinically warranted.     Naomie Dean, MD  [facility] [address] [place], [place] [address]  Phone [phone] Fax [phone]

## 2014-07-21 ENCOUNTER — Encounter: Payer: Self-pay | Admitting: *Deleted

## 2014-07-21 ENCOUNTER — Telehealth: Payer: Self-pay | Admitting: *Deleted

## 2014-07-21 NOTE — Telephone Encounter (Signed)
Tomma LightningBerlinda called wanted the results from the NCV/EMG faxed to the office of Dr. Pete GlatterStoneking Fax [phone]. Please make it to the attention of Maldives[place]linda

## 2014-07-21 NOTE — Progress Notes (Signed)
Faxed results of EMG/NCS to Dr. Pete GlatterStoneking per pt request. Faxed at 5:12pm on 07/21/14. Received fax confirmation.

## 2014-07-24 ENCOUNTER — Other Ambulatory Visit: Payer: Self-pay | Admitting: Geriatric Medicine

## 2014-07-24 DIAGNOSIS — M5412 Radiculopathy, cervical region: Secondary | ICD-10-CM

## 2014-08-08 ENCOUNTER — Other Ambulatory Visit: Payer: Self-pay

## 2014-08-09 ENCOUNTER — Ambulatory Visit
Admission: RE | Admit: 2014-08-09 | Discharge: 2014-08-09 | Disposition: A | Discharge status: Home / Self Care | Payer: BLUE CROSS/BLUE SHIELD | Source: Ambulatory Visit | Attending: Geriatric Medicine | Admitting: Geriatric Medicine

## 2014-08-09 DIAGNOSIS — M5412 Radiculopathy, cervical region: Secondary | ICD-10-CM

## 2014-09-11 ENCOUNTER — Ambulatory Visit: Payer: BLUE CROSS/BLUE SHIELD | Admitting: Neurology

## 2014-09-11 ENCOUNTER — Telehealth: Payer: Self-pay | Admitting: *Deleted

## 2014-09-11 NOTE — Telephone Encounter (Signed)
No showed f/u appt with Dr. [name].

## 2014-10-29 ENCOUNTER — Telehealth: Payer: Self-pay | Admitting: Neurology

## 2014-10-29 ENCOUNTER — Other Ambulatory Visit: Payer: Self-pay | Admitting: Neurology

## 2014-10-29 DIAGNOSIS — M79601 Pain in right arm: Secondary | ICD-10-CM

## 2014-10-29 MED ORDER — OXYCODONE-ACETAMINOPHEN 10-325 MG PO TABS: 1.0000 | ORAL_TABLET | Freq: Four times a day (QID) | ORAL | Status: DC | PRN

## 2014-10-29 NOTE — Telephone Encounter (Signed)
Talked with pt over the phone and prescribed percocet. Please see the documentation in the orders encounter.   Marvel Plan, MD PhD Stroke Neurology 10/29/2014 2:52 PM

## 2014-10-29 NOTE — Telephone Encounter (Signed)
Pt called stating she is in severe pain in hand and cramping of hand. She is to see Murphy/Wainer at 3:15 today for foot. I explained to her Dr [name] is not in office today. I scheduled an appt for 10/14 but is requesting a return phone call.

## 2014-10-29 NOTE — Telephone Encounter (Signed)
Spoke w/ pt about severe pain in right hand and cramping. She said her pain is at a 9-10 on pain scale. She had an MRI a year ago and when she took her friend to get her disc fused back on March 19, she woke up the next morning with all these symptoms. Her hand is currently cramping/seizing . She is already taking Neurontin. She had MRI done, but she has not paid yet because she does not work and her parents are helping her out. She is "being hit back and forth with things". She knows she missed an appt with Dr. [name], but things have been fine until now. She made appt with Dr. [name] on 10/14. She is aware she is out of the office for a family emergency. She says she is not sleeping, cannot stand, has a lot of anxiety, cannot put on a bra. Her whole hand is numb and she cannot use right side or even use her phone. I advised that she go to the ER since she is in so much pain/discomfort. She states that she cannot go today because she has an appt with Dr Murphy/Wainer at 315pm and her girlfriend is taking her. She has to go to that appt for inflammation in the arch of her foot. I suggested calling her PCP to make appt until she can see Dr. [name] next week. She stated "My PCP would not treat this. I do not go there. He would send me back to you. He never called me about MRI results. I had to look online and he had set up appt with Dr. [name] without telling me". She said they can request records from our office to know she is not prescribed any pain medications. She doesn't like to take things like that. But her pain is getting so bad, she is not sure what to do anymore. She wanted to make sure the ER could verify that Dr. [name] was not in the office today. She kept repeating "I have never called your office with this problem before". She said her girlfriend is staying in the other room right now because when she gets like this, they cannot handle each other. She needs to stay in the other room. She was very anxious  over the phone.   I told her I will ask the work-in doctor if there is anything else, but she really needs to go to the ER. She understands but she says she cannot go until tomorrow. Even if she goes, her girlfriend will just drop her off and not go in because she doesn't like hospitals. She stated "Won't Dr. [name] be back tomorrow? Can I come in tomorrow?" I advised that her schedule was full.

## 2014-10-29 NOTE — Telephone Encounter (Signed)
Rx percocet placed up front for pt to pick up.

## 2014-10-29 NOTE — Progress Notes (Signed)
Discussed pt over the phone. She stated that she has excruciating pain at right hand, going up to her elbow and armpit as well as right shoulder. Left hand also painful but not as much as right. She never asked any pain meds from [place] and she wants something to go through these days before she sees Dr. [name] next Friday.   I told her that she can told to her doctor this pm for her foot injection to see if the doctor has something to offer her. Also, she needs PT/OT for cervical radiculopathy. She agreed with that. And also I offered her lidocaine cream but she declined as she has to use a lot to cover her entire arm and back which she would not like to do. When asked what helped her in the past, she said oxycodone really helped in the past.   I will prescribe her with percocet for the next several days until she sees Dr. [name]. Also recommend PT/OT. She expressed understanding and appreciation.  Marvel Plan, MD PhD Stroke Neurology 10/29/2014 2:49 PM  Meds ordered this encounter  Medications  . oxyCODONE-acetaminophen (PERCOCET) 10-325 MG tablet    Sig: Take 1 tablet by mouth every 6 (six) hours as needed for pain.    Dispense:  25 tablet    Refill:  0

## 2014-11-07 ENCOUNTER — Encounter: Payer: Self-pay | Admitting: Neurology

## 2014-11-07 ENCOUNTER — Ambulatory Visit (INDEPENDENT_AMBULATORY_CARE_PROVIDER_SITE_OTHER): Payer: BLUE CROSS/BLUE SHIELD | Admitting: Neurology

## 2014-11-07 VITALS — BP 122/68 | HR 86 | Ht 68.25 in | Wt 280.0 lb

## 2014-11-07 DIAGNOSIS — G5601 Carpal tunnel syndrome, right upper limb: Secondary | ICD-10-CM

## 2014-11-07 DIAGNOSIS — G609 Hereditary and idiopathic neuropathy, unspecified: Secondary | ICD-10-CM | POA: Diagnosis not present

## 2014-11-07 DIAGNOSIS — M25531 Pain in right wrist: Secondary | ICD-10-CM | POA: Diagnosis not present

## 2014-11-07 DIAGNOSIS — M5412 Radiculopathy, cervical region: Secondary | ICD-10-CM

## 2014-11-07 DIAGNOSIS — M792 Neuralgia and neuritis, unspecified: Secondary | ICD-10-CM

## 2014-11-07 DIAGNOSIS — M79601 Pain in right arm: Secondary | ICD-10-CM

## 2014-11-07 DIAGNOSIS — M79641 Pain in right hand: Secondary | ICD-10-CM | POA: Diagnosis not present

## 2014-11-07 MED ORDER — GABAPENTIN (ONCE-DAILY) 600 MG PO TABS: 1800.0000 mg | ORAL_TABLET | Freq: Every day | ORAL | Status: DC

## 2014-11-07 MED ORDER — OXYCODONE-ACETAMINOPHEN 10-325 MG PO TABS: 1.0000 | ORAL_TABLET | Freq: Three times a day (TID) | ORAL | Status: DC | PRN
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
## 2014-11-07 NOTE — Patient Instructions (Signed)
Overall you are doing fairly well but I do want to suggest a few things today:   Remember to drink plenty of fluid, eat healthy meals and do not skip any meals. Try to eat protein with a every meal and eat a healthy snack such as fruit or nuts in between meals. Try to keep a regular sleep-wake schedule and try to exercise daily, particularly in the form of walking, 20-30 minutes a day, if you can.   As far as your medications are concerned, I would like to suggest: Gralise as instructed. Stop neurontin. One prescrption percocet.   As far as diagnostic testing: right wrist xray, labs today, referral to [facility], emg/ncs right arm  I would like to see you back for emg/ncs, sooner if we need to. Please call us with any interim questions, concerns, problems, updates or refill requests.   Please also call us for any test results so we can go over those with you on the phone.  My clinical assistant and will answer any of your questions and relay your messages to me and also relay most of my messages to you.   Our phone number is [phone]. We also have an after hours call service for urgent matters and there is a physician on-call for urgent questions. For any emergencies you know to call 911 or go to the nearest emergency room

## 2014-11-09 DIAGNOSIS — M79641 Pain in right hand: Secondary | ICD-10-CM | POA: Insufficient documentation

## 2014-11-10 ENCOUNTER — Encounter: Payer: Self-pay | Admitting: *Deleted

## 2014-11-10 NOTE — Progress Notes (Signed)
Sent enrollment form for Gralise to [facility]. Fax: [phone]. Received fax confirmation. Phone: [phone]

## 2014-11-11 ENCOUNTER — Encounter: Payer: BLUE CROSS/BLUE SHIELD | Admitting: Neurology

## 2014-11-12 ENCOUNTER — Telehealth: Payer: Self-pay | Admitting: *Deleted

## 2014-11-12 DIAGNOSIS — Z0289 Encounter for other administrative examinations: Secondary | ICD-10-CM

## 2014-11-12 LAB — METHYLMALONIC ACID, SERUM: Methylmalonic Acid: 195 nmol/L (ref 0–378)

## 2014-11-12 LAB — TSH: TSH: 0.108 u[IU]/mL — ABNORMAL LOW (ref 0.450–4.500)

## 2014-11-12 LAB — B12 AND FOLATE PANEL
Folate: 10.3 ng/mL (ref >3.0)
Vitamin B-12: 363 pg/mL (ref 211–946)

## 2014-11-12 LAB — HEMOGLOBIN A1C
Est. average glucose Bld gHb Est-mCnc: 105 mg/dL
Hgb A1c MFr Bld: 5.3 % (ref 4.8–5.6)

## 2014-11-12 LAB — VITAMIN B1: Thiamine: 157.8 nmol/L (ref 66.5–200.0)

## 2014-11-12 LAB — VITAMIN B6: VITAMIN B6: 15.6 ug/L (ref 2.0–32.8)

## 2014-11-12 NOTE — Telephone Encounter (Signed)
LVM for pt to call back about results. Gave [facility] phone number and office hours.  

## 2014-11-12 NOTE — Telephone Encounter (Signed)
-----   Message from Anson FretAntonia B Ahern, MD sent at 11/11/2014  4:54 PM EDT ----- Let patient know her thyroid wass very low. This can be seen in hyperthyroidism. She should follow up with pcp. Looks like it has been high in the past too. We can fax results to her pcp if needed.  thanks

## 2014-11-13 NOTE — Telephone Encounter (Signed)
LMVM for pt to return call re: lab results.

## 2014-11-17 NOTE — Telephone Encounter (Signed)
Called and spoke to pt about lab results. Advised per Dr. Lucia GaskinsAhern that her thyroid was low. This can be seen in hyperthyroidism. Her level was 0.108 (normal is 0.45-4.50). She should f.u with her PCP about this. She verified she can see results on Mychart. She is going to reschedule her EMG/NCS.

## 2014-11-21 ENCOUNTER — Telehealth: Payer: Self-pay | Admitting: Neurology

## 2014-11-21 NOTE — Telephone Encounter (Signed)
Pt called and states that she has seen some improvement with the medication. And states that her wrist is still hurting. She states that Dr. Lucia GaskinsAhern is right about the carpal tunnel and she is going to follow thru with there plans as stated during office visit . She would like to be considered first for when the time comes time to move the NCS and EMG up earlier. Please call and advise [phone]

## 2014-11-24 ENCOUNTER — Telehealth: Payer: Self-pay | Admitting: Neurology

## 2014-11-24 NOTE — Telephone Encounter (Signed)
Called pt back. Advised per Dr. Lucia GaskinsAhern [facility]. She can call to set up. Pt has phone number. I told her I will send form in to get her rx for Gralise 1800mg  at night. She should receive a call from specialty pharmacy. She verbalized understanding. I told her we will call her back about EMG/NCS per Dr. Lucia GaskinsAhern.  Pt stated she is going to in-pt place for her depression. [place]. Her psychologist recommended this. I told her I will let Dr. Lucia GaskinsAhern know and to keep in touch with us to let us know. She understands.

## 2014-11-24 NOTE — Telephone Encounter (Signed)
Please fill out a form for gralise to order, thanks. Please let her know we may be able to do the NCS in November, we have a technician coming. Thanks.

## 2014-11-24 NOTE — Telephone Encounter (Signed)
Pt called and states that Dr. Lucia GaskinsAhern set up an appt for her to have an xray on her wrist but she did not go. She would like to have that xray set back up if possible. She is also wondering about the medication Gralise 1800mg  all at night. She states she was given samples while here in the office and would like a Rx for them . Please use [facility] [place]. She is asking for a referral to [facility] for NVC/EMG.  Please call and advise [phone]

## 2014-11-25 ENCOUNTER — Encounter: Payer: Self-pay | Admitting: *Deleted

## 2014-11-25 NOTE — Progress Notes (Signed)
Faxed gralise enrollment for 600mg  3 tablets daily to [facility] at [phone]. Received fax confirmation. Copy sent to medical records.

## 2014-11-26 NOTE — Telephone Encounter (Signed)
We are scheduling emg/ncs here with Renee [name]. Can we get her an appointment this month? She is easy, bilateral CTS. thanks

## 2014-11-27 NOTE — Telephone Encounter (Signed)
Pt called office . [place] [facility]. Will be out of medication soon. Only two days left. Can she get any samples to hold her over until she gets rx gralise? Explained process on how we request rx gralise. We go through specialty pharmacy. Gave her their phone number to call and check on status. Told her I will call her back no later than tomorrow morning. She verbalized understanding.    Okay per Dr Lucia GaskinsAhern to give samples. Called pt back and advised I will leave samples up front for her to pick up. She can get medication prescribed at rehab place as long as she has medication list with her per Dr. Lucia GaskinsAhern. She is going to sign release form tomorrow when she comes to pick up samples of Gralise. Advised we are open 8-12pm.

## 2014-11-28 ENCOUNTER — Telehealth: Payer: Self-pay | Admitting: Neurology

## 2014-11-28 ENCOUNTER — Other Ambulatory Visit: Payer: Self-pay | Admitting: *Deleted

## 2014-11-28 ENCOUNTER — Ambulatory Visit
Admission: RE | Admit: 2014-11-28 | Discharge: 2014-11-28 | Disposition: A | Discharge status: Home / Self Care | Payer: BLUE CROSS/BLUE SHIELD | Source: Ambulatory Visit | Attending: Neurology | Admitting: Neurology

## 2014-11-28 DIAGNOSIS — G5601 Carpal tunnel syndrome, right upper limb: Secondary | ICD-10-CM

## 2014-11-28 DIAGNOSIS — M25531 Pain in right wrist: Secondary | ICD-10-CM

## 2014-11-28 DIAGNOSIS — G609 Hereditary and idiopathic neuropathy, unspecified: Secondary | ICD-10-CM

## 2014-11-28 NOTE — Telephone Encounter (Signed)
Sent to me in error.  Not my pt, will route back to sender.

## 2014-11-28 NOTE — Telephone Encounter (Signed)
Placed correct order. Per Dr Epimenio FootSater, Molli Knockkay to order regular xray wrist.

## 2014-11-28 NOTE — Telephone Encounter (Signed)
[facility] [phone] called regarding order for xray of wrist, order put in as arthrogram, believes it is supposed to be regular wrist xray. Please call to clarify.

## 2014-11-29 NOTE — Telephone Encounter (Signed)
Yes regular wrist xray thanks

## 2014-12-01 ENCOUNTER — Telehealth: Payer: Self-pay | Admitting: *Deleted

## 2014-12-01 NOTE — Telephone Encounter (Signed)
-----   Message from Asa Lenteichard A Sater, MD sent at 12/01/2014 12:55 PM EST ----- [name](Ahern Patient) Please let her know the wrist x-ray is normal

## 2014-12-01 NOTE — Telephone Encounter (Signed)
LVM for pt to call about results. Gave [facility] phone number and hours. Ok to inform pt xray wrist normal.

## 2014-12-03 ENCOUNTER — Telehealth: Payer: Self-pay | Admitting: Neurology

## 2014-12-03 NOTE — Telephone Encounter (Signed)
I called back.  Spoke with Sal.  Verified requested info.  They have updated patient's file there, and will cal us back if anything further is needed.

## 2014-12-03 NOTE — Telephone Encounter (Signed)
[facility]/  [phone] opt 3, opt 6 for pain mgmt, called [facility]:[id number]:Gralise 600 mg., requests ICD 10 codes and allergies.

## 2014-12-04 NOTE — Telephone Encounter (Signed)
LVM for pt to call about results. Gave [facility] phone number.  

## 2014-12-08 ENCOUNTER — Encounter: Payer: Self-pay | Admitting: *Deleted

## 2014-12-09 NOTE — Telephone Encounter (Signed)
Mailed results to pt.

## 2014-12-16 ENCOUNTER — Encounter: Payer: BLUE CROSS/BLUE SHIELD | Admitting: Neurology

## 2014-12-30 ENCOUNTER — Encounter: Payer: BLUE CROSS/BLUE SHIELD | Admitting: Neurology

## 2014-12-30 ENCOUNTER — Telehealth: Payer: Self-pay

## 2014-12-30 NOTE — Telephone Encounter (Signed)
Looking through notes from when Renee [name] was out and noticed she had not been scheduled for her NCV/EMG, I called left a message seeing if she was still interested and to call us back to r/s

## 2015-02-23 ENCOUNTER — Other Ambulatory Visit (HOSPITAL_COMMUNITY): Payer: BLUE CROSS/BLUE SHIELD | Attending: Psychiatry | Admitting: Psychiatry

## 2015-02-23 ENCOUNTER — Encounter (HOSPITAL_COMMUNITY): Payer: Self-pay | Admitting: Psychiatry

## 2015-02-23 VITALS — BP 137/92 | HR 86 | Resp 14 | Ht 68.5 in | Wt 276.6 lb

## 2015-02-23 DIAGNOSIS — E039 Hypothyroidism, unspecified: Secondary | ICD-10-CM | POA: Insufficient documentation

## 2015-02-23 DIAGNOSIS — I10 Essential (primary) hypertension: Secondary | ICD-10-CM | POA: Insufficient documentation

## 2015-02-23 DIAGNOSIS — F3181 Bipolar II disorder: Secondary | ICD-10-CM | POA: Insufficient documentation

## 2015-02-23 DIAGNOSIS — Z880 Allergy status to penicillin: Secondary | ICD-10-CM | POA: Insufficient documentation

## 2015-02-23 DIAGNOSIS — Z885 Allergy status to narcotic agent status: Secondary | ICD-10-CM | POA: Diagnosis not present

## 2015-02-23 DIAGNOSIS — K219 Gastro-esophageal reflux disease without esophagitis: Secondary | ICD-10-CM | POA: Diagnosis not present

## 2015-02-23 DIAGNOSIS — G4700 Insomnia, unspecified: Secondary | ICD-10-CM | POA: Diagnosis not present

## 2015-02-23 DIAGNOSIS — F419 Anxiety disorder, unspecified: Secondary | ICD-10-CM | POA: Insufficient documentation

## 2015-02-23 DIAGNOSIS — F17210 Nicotine dependence, cigarettes, uncomplicated: Secondary | ICD-10-CM | POA: Diagnosis not present

## 2015-02-23 DIAGNOSIS — Z62810 Personal history of physical and sexual abuse in childhood: Secondary | ICD-10-CM | POA: Insufficient documentation

## 2015-02-23 DIAGNOSIS — F4310 Post-traumatic stress disorder, unspecified: Secondary | ICD-10-CM | POA: Insufficient documentation

## 2015-02-23 DIAGNOSIS — Z6841 Body Mass Index (BMI) 40.0 and over, adult: Secondary | ICD-10-CM | POA: Diagnosis not present

## 2015-02-23 DIAGNOSIS — F1021 Alcohol dependence, in remission: Secondary | ICD-10-CM | POA: Diagnosis not present

## 2015-02-23 DIAGNOSIS — F902 Attention-deficit hyperactivity disorder, combined type: Secondary | ICD-10-CM | POA: Insufficient documentation

## 2015-02-23 NOTE — Progress Notes (Signed)
    Daily Group Progress Note  Program: IOP  Group Time: 9:00-10:30  Participation Level: Active  Behavioral Response: Appropriate  Type of Therapy:  Psycho-education Group  Summary of Progress: Pt. Met with case Freight forwarder and PA.     Group Time: 10:30-12:00  Participation Level:  Active  Behavioral Response: Appropriate  Type of Therapy: Group Therapy  Summary of Progress: Pt. Presented with euthymic mood, talkative, engaged in the group process. Pt. Discussed her treatment for eating disorder and alcohol dependency.   Eloise Levels, [facility]
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
## 2015-02-24 ENCOUNTER — Other Ambulatory Visit (HOSPITAL_COMMUNITY): Payer: BLUE CROSS/BLUE SHIELD | Admitting: Psychiatry

## 2015-02-24 ENCOUNTER — Encounter (HOSPITAL_COMMUNITY): Payer: Self-pay

## 2015-02-24 DIAGNOSIS — O2690 Pregnancy related conditions, unspecified, unspecified trimester: Secondary | ICD-10-CM | POA: Insufficient documentation

## 2015-02-24 DIAGNOSIS — F3181 Bipolar II disorder: Secondary | ICD-10-CM | POA: Insufficient documentation

## 2015-02-24 DIAGNOSIS — O2691 Pregnancy related conditions, unspecified, first trimester: Secondary | ICD-10-CM

## 2015-02-24 DIAGNOSIS — M5030 Other cervical disc degeneration, unspecified cervical region: Secondary | ICD-10-CM | POA: Insufficient documentation

## 2015-02-24 DIAGNOSIS — F902 Attention-deficit hyperactivity disorder, combined type: Secondary | ICD-10-CM

## 2015-02-24 DIAGNOSIS — Z6372 Alcoholism and drug addiction in family: Secondary | ICD-10-CM

## 2015-02-24 DIAGNOSIS — F4321 Adjustment disorder with depressed mood: Secondary | ICD-10-CM

## 2015-02-24 DIAGNOSIS — F419 Anxiety disorder, unspecified: Secondary | ICD-10-CM

## 2015-02-24 DIAGNOSIS — Z62810 Personal history of physical and sexual abuse in childhood: Secondary | ICD-10-CM

## 2015-02-24 DIAGNOSIS — F4310 Post-traumatic stress disorder, unspecified: Secondary | ICD-10-CM

## 2015-02-24 DIAGNOSIS — F1021 Alcohol dependence, in remission: Secondary | ICD-10-CM | POA: Insufficient documentation

## 2015-02-24 HISTORY — DX: Other cervical disc degeneration, unspecified cervical region: M50.30

## 2015-02-24 MED ORDER — OXCARBAZEPINE 300 MG PO TABS: 600.0000 mg | ORAL_TABLET | Freq: Two times a day (BID) | ORAL | Status: DC
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
## 2015-02-25 ENCOUNTER — Other Ambulatory Visit (HOSPITAL_COMMUNITY): Payer: BLUE CROSS/BLUE SHIELD | Attending: Psychiatry | Admitting: Psychiatry

## 2015-02-25 DIAGNOSIS — F3181 Bipolar II disorder: Secondary | ICD-10-CM | POA: Insufficient documentation

## 2015-02-25 DIAGNOSIS — F1021 Alcohol dependence, in remission: Secondary | ICD-10-CM | POA: Diagnosis not present

## 2015-02-25 DIAGNOSIS — F419 Anxiety disorder, unspecified: Secondary | ICD-10-CM | POA: Diagnosis not present

## 2015-02-25 DIAGNOSIS — Z62810 Personal history of physical and sexual abuse in childhood: Secondary | ICD-10-CM | POA: Insufficient documentation

## 2015-02-25 DIAGNOSIS — F4310 Post-traumatic stress disorder, unspecified: Secondary | ICD-10-CM | POA: Insufficient documentation

## 2015-02-25 DIAGNOSIS — F902 Attention-deficit hyperactivity disorder, combined type: Secondary | ICD-10-CM | POA: Diagnosis not present

## 2015-02-26 ENCOUNTER — Ambulatory Visit: Payer: Self-pay | Admitting: *Deleted

## 2015-02-26 ENCOUNTER — Other Ambulatory Visit (HOSPITAL_COMMUNITY): Payer: BLUE CROSS/BLUE SHIELD

## 2015-02-26 NOTE — Progress Notes (Signed)
    Daily Group Progress Note  Program: IOP  Group Time: 9:00-10:30  Participation Level: Active  Behavioral Response: Appropriate  Type of Therapy:  Group Therapy  Summary of Progress: Pt. Presented with brightened affected, engaged in group process, listened to other group members and encouraging of other group member's process. Pt. Shared that she was setting healthier boundaries with her family and focusing on her self-care.     Group Time: 10:30-12:00  Participation Level:  Active  Behavioral Response: Appropriate  Type of Therapy: Psycho-education Group  Summary of Progress: Pt. Participated in instruction of grounding sequence, breathing exercises, and countering cognitive distortions.  Shaune Pollack, LPC

## 2015-02-27 ENCOUNTER — Other Ambulatory Visit (HOSPITAL_COMMUNITY): Payer: BLUE CROSS/BLUE SHIELD | Admitting: Psychiatry

## 2015-02-27 DIAGNOSIS — F3181 Bipolar II disorder: Secondary | ICD-10-CM | POA: Diagnosis not present

## 2015-02-27 DIAGNOSIS — F4310 Post-traumatic stress disorder, unspecified: Secondary | ICD-10-CM

## 2015-02-27 NOTE — Progress Notes (Signed)
    Daily Group Progress Note  Program: IOP  Group Time: 9:00-10:30  Participation Level: Active  Behavioral Response: Appropriate  Type of Therapy:  Group Therapy  Summary of Progress: Pt. Met with Dr. Taylor. Pt. Is challenged to focus on her self care, balancing with care of her mother.     Group Time: 10:30-12:00  Participation Level:  Active  Behavioral Response: Appropriate  Type of Therapy: Psycho-education Group  Summary of Progress: Pt. Participated in grief and loss group facilitated by Bob Hamilton.  Jennifer Brown, LPC 

## 2015-03-02 ENCOUNTER — Other Ambulatory Visit (HOSPITAL_COMMUNITY): Payer: BLUE CROSS/BLUE SHIELD | Admitting: Psychiatry

## 2015-03-02 DIAGNOSIS — F4310 Post-traumatic stress disorder, unspecified: Secondary | ICD-10-CM

## 2015-03-02 DIAGNOSIS — F419 Anxiety disorder, unspecified: Secondary | ICD-10-CM

## 2015-03-02 DIAGNOSIS — F3181 Bipolar II disorder: Secondary | ICD-10-CM | POA: Diagnosis not present

## 2015-03-02 NOTE — Progress Notes (Signed)
    Daily Group Progress Note  Program: IOP  Group Time: 9:00-10:30  Participation Level: Active  Behavioral Response: Appropriate  Type of Therapy:  Psycho-education Group  Summary of Progress: Pt. Participated in medication management group with Michelle Nasuti.     Group Time:  10:30-12:00  Participation Level:  Active  Behavioral Response: Appropriate  Type of Therapy: Group Therapy  Summary of Progress: Pt. Presents as active in group, seeks support as needed, appropriately tearful. Pt. Shared fear about seeing her primary care provider today and worried about being judged for small amount of weight loss since inpatient treatment and being seen as not committed to her treatment. Pt. Was encouraged to share her concerns with her doctor and to seek support as needed from her providers. Pt. Was encouraged to "do the next best thing" to manage feelings of being overwhelmed by day-to-day tasks.   Shaune Pollack, LPC

## 2015-03-03 ENCOUNTER — Other Ambulatory Visit (HOSPITAL_COMMUNITY): Payer: BLUE CROSS/BLUE SHIELD | Admitting: Psychiatry

## 2015-03-03 DIAGNOSIS — F419 Anxiety disorder, unspecified: Secondary | ICD-10-CM

## 2015-03-03 DIAGNOSIS — F3181 Bipolar II disorder: Secondary | ICD-10-CM | POA: Diagnosis not present

## 2015-03-03 DIAGNOSIS — F4310 Post-traumatic stress disorder, unspecified: Secondary | ICD-10-CM

## 2015-03-04 ENCOUNTER — Other Ambulatory Visit (HOSPITAL_COMMUNITY): Payer: BLUE CROSS/BLUE SHIELD

## 2015-03-04 NOTE — Progress Notes (Signed)
    Daily Group Progress Note  Program: IOP  Group Time: 9:00-10:30  Participation Level: Active  Behavioral Response: Appropriate  Type of Therapy:  Group Therapy  Summary of Progress: Pt. Presented as mildly anxious, continues to be challenged by setting healthy boundaries with group members, wants to problem solve for group members. Pt. Is receptive to feedback from counselor and is developing awareness of how patterns of excessive care giving affect her depression.      Group Time: 10:30-12:00  Participation Level:  Active  Behavioral Response: Appropriate  Type of Therapy: Psycho-education Group  Summary of Progress: Pt. Participated in yoga session facilitated by Forde Radon.  Shaune Pollack, LPC

## 2015-03-05 ENCOUNTER — Other Ambulatory Visit (HOSPITAL_COMMUNITY): Payer: BLUE CROSS/BLUE SHIELD

## 2015-03-06 ENCOUNTER — Other Ambulatory Visit (HOSPITAL_COMMUNITY): Payer: BLUE CROSS/BLUE SHIELD

## 2015-03-09 ENCOUNTER — Other Ambulatory Visit (HOSPITAL_COMMUNITY): Payer: BLUE CROSS/BLUE SHIELD | Admitting: Psychiatry

## 2015-03-09 DIAGNOSIS — F4310 Post-traumatic stress disorder, unspecified: Secondary | ICD-10-CM

## 2015-03-09 DIAGNOSIS — F3181 Bipolar II disorder: Secondary | ICD-10-CM | POA: Diagnosis not present

## 2015-03-09 NOTE — Progress Notes (Signed)
    Daily Group Progress Note  Program: IOP  Group Time:  9:00-10:30  Participation Level: Active  Behavioral Response: Appropriate  Type of Therapy:  Psycho-education Group  Summary of Progress: Pt. Participated in medication management session with [place].     Group Time: 10:30-12:00  Participation Level:  Active  Behavioral Response: Appropriate  Type of Therapy: Group Therapy  Summary of Progress: Pt. Presented as active and engaged in group process. Pt. Shared progress that has made since inpatient treatment. Pt. Shared relationship history with current partner and concerns about health of the relationship.   Shaune Pollack, LPC

## 2015-03-10 ENCOUNTER — Other Ambulatory Visit (HOSPITAL_COMMUNITY): Payer: BLUE CROSS/BLUE SHIELD | Admitting: Psychiatry

## 2015-03-10 DIAGNOSIS — F3181 Bipolar II disorder: Secondary | ICD-10-CM | POA: Diagnosis not present

## 2015-03-10 DIAGNOSIS — F4310 Post-traumatic stress disorder, unspecified: Secondary | ICD-10-CM

## 2015-03-10 NOTE — Progress Notes (Signed)
    Daily Group Progress Note  Program: IOP  Group Time: 9:00-12:00  Participation Level: Active  Behavioral Response: Appropriate  Type of Therapy:  Group Therapy  Summary of Progress: Pt. Presented as talkative, at times needed redirection due to desire to problem solve for other patients. Pt. Approaches the therapeutic process enthusiastically, some times too much but is receptive to redirection from the counselor. Pt. Participated in extended group therapy session processing group member experiences with relationship boundaries, guilt and shame related to communicating personal needs for others, patterns of excessive caregiving and tendencies to "save" others in relationships.     Shaune Pollack, LPC

## 2015-03-11 ENCOUNTER — Other Ambulatory Visit (HOSPITAL_COMMUNITY): Payer: BLUE CROSS/BLUE SHIELD | Admitting: Psychiatry

## 2015-03-11 DIAGNOSIS — F900 Attention-deficit hyperactivity disorder, predominantly inattentive type: Secondary | ICD-10-CM

## 2015-03-11 DIAGNOSIS — F3181 Bipolar II disorder: Secondary | ICD-10-CM | POA: Diagnosis not present

## 2015-03-11 MED ORDER — AMPHETAMINE-DEXTROAMPHETAMINE 30 MG PO TABS: 30.0000 mg | ORAL_TABLET | Freq: Two times a day (BID) | ORAL | Status: DC | PRN

## 2015-03-12 ENCOUNTER — Other Ambulatory Visit (HOSPITAL_COMMUNITY): Payer: BLUE CROSS/BLUE SHIELD | Admitting: Psychiatry

## 2015-03-12 DIAGNOSIS — F3181 Bipolar II disorder: Secondary | ICD-10-CM | POA: Diagnosis not present

## 2015-03-12 DIAGNOSIS — F900 Attention-deficit hyperactivity disorder, predominantly inattentive type: Secondary | ICD-10-CM

## 2015-03-12 NOTE — Progress Notes (Signed)
Requesting a refill of Adderall as she lost her current bottle.  Refill written

## 2015-03-12 NOTE — Progress Notes (Signed)
    Daily Group Progress Note  Program: IOP  Group Time: 9:00-10:30  Participation Level: Active  Behavioral Response: Appropriate  Type of Therapy:  Group Therapy  Summary of Progress: Pt. Presented as tearful, anxious, restless, overwhelmed. Pt. Reported to the group that she lost her adderal and was worried that she would be viewed as drug-seeking. Pt. Was encouraged to discuss the problem with Dr. Ladona Ridgel. Pt. Processed shame and stigma internalized because of brother's chemical dependence and drug seeking. Pt. Was able to get new prescription for adderal by the end of the the day's group.     Group Time: 10:30-12:00  Participation Level:  Active  Behavioral Response: Appropriate  Type of Therapy: Psycho-education Group  Summary of Progress: Pt. Participated in yoga therapy group facilitated by Forde Radon, LPC.  Shaune Pollack, LPC

## 2015-03-13 ENCOUNTER — Other Ambulatory Visit (HOSPITAL_COMMUNITY): Payer: BLUE CROSS/BLUE SHIELD | Admitting: Psychiatry

## 2015-03-13 DIAGNOSIS — F900 Attention-deficit hyperactivity disorder, predominantly inattentive type: Secondary | ICD-10-CM

## 2015-03-13 DIAGNOSIS — F3181 Bipolar II disorder: Secondary | ICD-10-CM | POA: Diagnosis not present

## 2015-03-13 NOTE — Progress Notes (Signed)
    Daily Group Progress Note  Program: IOP  Group Time: 9:00-10:30  Participation Level: Active  Behavioral Response: Appropriate  Type of Therapy:  Group Therapy  Summary of Progress: Pt. Presented as talkative, engaged in group process, developing some awareness around tendency to monopolize. Pt. Reported that she was able to get her medication and was feeling better. Pt. Shared that she is processing her discontentment and patterns of co-dependency in relationships.     Group Time: 10:30-12:00  Participation Level:  Active  Behavioral Response: Appropriate  Type of Therapy: Psycho-education Group  Summary of Progress: Pt. Participated in grief and loss group with Graciela Husbands.  Shaune Pollack, [facility]

## 2015-03-13 NOTE — Progress Notes (Signed)
    Daily Group Progress Note  Program: IOP  Group Time: 9:00-10:30  Participation Level: Active  Behavioral Response: Appropriate  Type of Therapy:  Group Therapy  Summary of Progress: Pt. Presented as talkative, engaged in group process. Pt. Shared experience of lessons learned in inpatient recovery and learning to sit with the discomfort of her cravings.      Group Time: 10:30-12:00  Participation Level:  Active  Behavioral Response: Appropriate  Type of Therapy: Psycho-education Group  Summary of Progress: Pt. Participated in discussion about Dewain Penning developing vulnerability to manage shame and avoidance.   Shaune Pollack, LPC

## 2015-03-16 ENCOUNTER — Telehealth (HOSPITAL_COMMUNITY): Payer: Self-pay | Admitting: Psychiatry

## 2015-03-16 ENCOUNTER — Other Ambulatory Visit (HOSPITAL_COMMUNITY): Payer: BLUE CROSS/BLUE SHIELD | Admitting: Psychiatry

## 2015-03-17 ENCOUNTER — Other Ambulatory Visit (HOSPITAL_COMMUNITY): Payer: BLUE CROSS/BLUE SHIELD | Admitting: Psychiatry

## 2015-03-17 DIAGNOSIS — F3181 Bipolar II disorder: Secondary | ICD-10-CM | POA: Diagnosis not present

## 2015-03-17 DIAGNOSIS — F900 Attention-deficit hyperactivity disorder, predominantly inattentive type: Secondary | ICD-10-CM

## 2015-03-17 DIAGNOSIS — F419 Anxiety disorder, unspecified: Secondary | ICD-10-CM

## 2015-03-18 ENCOUNTER — Other Ambulatory Visit (HOSPITAL_COMMUNITY): Payer: BLUE CROSS/BLUE SHIELD

## 2015-03-18 NOTE — Progress Notes (Signed)
    Daily Group Progress Note  Program: IOP  Group Time: 9:00-10:30  Participation Level: Active  Behavioral Response: Agitated  Type of Therapy:  Group Therapy  Summary of Progress: Pt. Presents as hyperverbal, anxious. Pt. Reports that she feels overwhelmed by relationships and life decisions since discharge from long-term treatment.     Group Time: 10:30-12:00  Participation Level:  Active  Behavioral Response: Agitated  Type of Therapy: Psycho-education Group  Summary of Progress: Pt. Continues to present as hyperverbal and anxious. Pt. Is challenged to establish appropriate relationship boundaries and is triggered by excessive need to problem solve for others.   Shaune Pollack, LPC

## 2015-03-19 ENCOUNTER — Other Ambulatory Visit (HOSPITAL_COMMUNITY): Payer: BLUE CROSS/BLUE SHIELD | Admitting: Psychiatry

## 2015-03-19 DIAGNOSIS — F3181 Bipolar II disorder: Secondary | ICD-10-CM | POA: Diagnosis not present

## 2015-03-19 DIAGNOSIS — F419 Anxiety disorder, unspecified: Secondary | ICD-10-CM

## 2015-03-19 NOTE — Progress Notes (Signed)
    Daily Group Progress Note  Program: IOP  Group Time: 9:00-12:00  Participation Level: Active  Behavioral Response: Appropriate  Type of Therapy:  Group Therapy  Summary of Progress: Pt. Presented as talkative, mildly anxious. Pt. Reported that she felt sad because she missed discharged group members. Pt. Shared challenge of completing paperwork so that she can see her individual therapist. Pt. Discussed role of all-or-nothing thinking in her life and how it affects her motivation to complete tasks. Pt. Participated in discussion about how her diet affects energy level and mood. Pt participated in discussion about reflective reading about not hesitating, but learning to use our breath to help [place] move through experiences.        Shaune Pollack, LPC

## 2015-03-20 ENCOUNTER — Other Ambulatory Visit (HOSPITAL_COMMUNITY): Payer: BLUE CROSS/BLUE SHIELD | Admitting: Psychiatry

## 2015-03-20 DIAGNOSIS — F900 Attention-deficit hyperactivity disorder, predominantly inattentive type: Secondary | ICD-10-CM

## 2015-03-20 DIAGNOSIS — F3181 Bipolar II disorder: Secondary | ICD-10-CM | POA: Diagnosis not present

## 2015-03-20 NOTE — Progress Notes (Signed)
    Daily Group Progress Note  Program: IOP  Group Time: 9:00-10:30  Participation Level: Active  Behavioral Response: Appropriate  Type of Therapy:  Group Therapy  Summary of Progress: Pt. Presented as talkative, engaged in process, mildly tearful. Pt. Shared tendency toward low frustration tolerance, concerns about lack of physical activity, and OCD behaviors. Pt. Was encouraged to engage slowly in healthy behaviors, setting obtainable short-term goals and not set unrealistic expectations for herself.     Group Time: 10:30-12:00  Participation Level:  Active  Behavioral Response: Appropriate  Type of Therapy: Psycho-education Group  Summary of Progress: Pt. Participated in discussion about grief and loss.  Shaune Pollack, LPC

## 2015-03-23 ENCOUNTER — Other Ambulatory Visit (HOSPITAL_COMMUNITY): Payer: BLUE CROSS/BLUE SHIELD | Admitting: Psychiatry

## 2015-03-24 ENCOUNTER — Other Ambulatory Visit (HOSPITAL_COMMUNITY): Payer: BLUE CROSS/BLUE SHIELD

## 2015-03-25 ENCOUNTER — Other Ambulatory Visit (HOSPITAL_COMMUNITY): Payer: BLUE CROSS/BLUE SHIELD

## 2015-03-26 ENCOUNTER — Other Ambulatory Visit (HOSPITAL_COMMUNITY): Payer: BLUE CROSS/BLUE SHIELD | Attending: Psychiatry | Admitting: Psychiatry

## 2015-03-26 DIAGNOSIS — F900 Attention-deficit hyperactivity disorder, predominantly inattentive type: Secondary | ICD-10-CM | POA: Diagnosis not present

## 2015-03-26 DIAGNOSIS — F3181 Bipolar II disorder: Secondary | ICD-10-CM | POA: Diagnosis present

## 2015-03-27 ENCOUNTER — Other Ambulatory Visit (HOSPITAL_COMMUNITY): Payer: BLUE CROSS/BLUE SHIELD

## 2015-03-27 NOTE — Progress Notes (Signed)
    Daily Group Progress Note  Program: IOP  Group Time: 9:00-12:00  Participation Level: Active  Behavioral Response: Appropriate  Type of Therapy:  Group Therapy  Summary of Progress: Pt. Presented as anxious, difficulty grounding and connecting with the group process. Pt. Discussed preoccupation with her weight and found scale during break. Pt. Reported that when she weighed herself she felt more calm because it confirmed for her that she was on the right track. Pt. Participated in discussion about mindfulness, checking in with her body for feedback regarding her emotional state and using grounding exercises to shift her emotional state. Participated in discussion of self-compassion i.e., words of self-kindness, acceptance of common humanity, and mindfulness and how to use self-compassion to cope with difficult emotions.      Shaune PollackBrown, Rogers Ditter B, LPC

## 2015-03-30 ENCOUNTER — Other Ambulatory Visit (HOSPITAL_COMMUNITY): Payer: BLUE CROSS/BLUE SHIELD | Admitting: Psychiatry

## 2015-03-30 DIAGNOSIS — F419 Anxiety disorder, unspecified: Secondary | ICD-10-CM

## 2015-03-30 DIAGNOSIS — F3181 Bipolar II disorder: Secondary | ICD-10-CM | POA: Diagnosis not present

## 2015-03-30 DIAGNOSIS — F900 Attention-deficit hyperactivity disorder, predominantly inattentive type: Secondary | ICD-10-CM

## 2015-03-30 NOTE — Progress Notes (Signed)
    Daily Group Progress Note  Program: IOP  Group Time: 9:00-10:30  Participation Level: Minimal  Behavioral Response: Appropriate  Type of Therapy:  Psycho-education Group  Summary of Progress: Pt. Was late for group, attended part of medication education group with SidneyElena.     Group Time: 10:30-12:00  Participation Level:  Active  Behavioral Response: Monopolizing  Type of Therapy: Group Therapy  Summary of Progress: Pt. Presented as anxious and hyperverbal. Pt. Anxious about feedback from her partner that she is not getting better. Pt. Reports that she is very anxious, not sleeping well, and restless. Pt. Is aware of problems in her relationship, but is reluctant to seek couples counseling. Pt. Interested in partial program and sought referral for this. Pt. Also believes she needs 2x a week individual therapy, but concerned about getting this level of care with referred therapist. Pt. Needs socialization, but has become dependent on group process and reluctant to engage in needed interventions i.e., support groups, mental health association, or scheduling therapy without assistance.  Shaune PollackBrown, Jennifer B, LPC

## 2015-03-30 NOTE — Progress Notes (Signed)
Renee CostaKathryn Sanger is a 40 y.o. female, who has been in MH-IOP since 02-23-15 and is decompensating.  Pt reports worsening depression and anxiety symptoms (ie. Sadness, very low self esteem, irritable, indecisiveness, anhedonia, no motivation, ruminating thoughts, and tearfulness).  Denies SI.  Patient's upcoming discharge date is 04-02-15.  Pt expressed interest in a partial program since recent ending of a relationship.  A:  Contacted [facility].  Spoke to TazewellAndrea re: admission for PHP.  Will fax over chart information.  Inform Dr. Ladona Ridgelaylor.  R:  Pt receptive.       Chestine SporeLARK, RITA, M.Ed, CNA

## 2015-03-31 ENCOUNTER — Other Ambulatory Visit (HOSPITAL_COMMUNITY): Payer: BLUE CROSS/BLUE SHIELD

## 2015-04-01 ENCOUNTER — Other Ambulatory Visit (HOSPITAL_COMMUNITY): Payer: BLUE CROSS/BLUE SHIELD | Admitting: Psychiatry

## 2015-04-01 DIAGNOSIS — F3181 Bipolar II disorder: Secondary | ICD-10-CM | POA: Diagnosis not present

## 2015-04-01 DIAGNOSIS — F900 Attention-deficit hyperactivity disorder, predominantly inattentive type: Secondary | ICD-10-CM

## 2015-04-02 ENCOUNTER — Other Ambulatory Visit (HOSPITAL_COMMUNITY): Payer: BLUE CROSS/BLUE SHIELD | Admitting: Psychiatry

## 2015-04-02 DIAGNOSIS — F900 Attention-deficit hyperactivity disorder, predominantly inattentive type: Secondary | ICD-10-CM

## 2015-04-02 DIAGNOSIS — F3181 Bipolar II disorder: Secondary | ICD-10-CM | POA: Diagnosis not present

## 2015-04-02 MED ORDER — AMPHETAMINE-DEXTROAMPHETAMINE 30 MG PO TABS: 30.0000 mg | ORAL_TABLET | Freq: Two times a day (BID) | ORAL | Status: DC | PRN

## 2015-04-02 NOTE — Progress Notes (Signed)
    Daily Group Progress Note  Program: IOP  Group Time: 9:00-12:00  Participation Level: Active  Behavioral Response: Appropriate  Type of Therapy:  Group Therapy  Summary of Progress: Pt. Continues to present as hyperverbal, anxious, and at times intrusive. Pt. Discussed her anxiety about discharge and not receiving psychiatric care after discharge. Pt. Was reassured by therapist that she would be discharged with all of the appropriate referrals. Pt. Is becoming more dependent on group process, Pt. Continues to grieve loss of her brother and is challenge to address issues in her co-dependent relationship with significant other.      Shaune PollackBrown, Lillias Difrancesco B, LPC

## 2015-04-02 NOTE — Progress Notes (Signed)
Patient ID: Renee CostaKathryn [name], female   DOB: 06/19/1975, 40 y.o.   MRN: [id number] Discharge Note  Patient:  Renee CostaKathryn [name] is an 40 y.o., female DOB:  10/24/1975  Date of Admission:  02/24/2015 Date of Discharge: 04/03/2015  Reason for Admission:just out of long term residential treatment program and needing transition  IOP Course:  Attended and participated.  Has benefitted from the recent treatment but as her stay in the program continued she deteriorated somewhat.  Still depressed, having labile mood swings but not suicidal.  Responds to structure and says she still needs more than outpatient therapy alone can provide.  Mental Status at Discharge:not suicidal mood swings frequent but can be redirected with intervention, not as well on her own.  Lab Results: No results found for this or any previous visit (from the past 48 hour(s)).   Current outpatient prescriptions:  .  acetaminophen (TYLENOL) 500 MG tablet, Take 1,000 mg by mouth every 6 (six) hours as needed (Pain)., Disp: , Rfl:  .  ALPRAZolam (XANAX) 1 MG tablet, Take 1 mg by mouth 2 (two) times daily. .5mg  at 5pm and 1 tab at hs, Disp: , Rfl:  .  amphetamine-dextroamphetamine (ADDERALL) 30 MG tablet, Take 1 tablet by mouth 2 (two) times daily as needed (for add)., Disp: 60 tablet, Rfl: 0 .  Calcium Citrate-Vitamin D (CALCIUM CITRATE + D3 PO), Take 2 tablets by mouth 3 (three) times daily., Disp: , Rfl:  .  carisoprodol (SOMA) 350 MG tablet, Take 350 mg by mouth 4 (four) times daily as needed for muscle spasms., Disp: , Rfl:  .  celecoxib (CELEBREX) 200 MG capsule, Take 200 mg by mouth 2 (two) times daily., Disp: , Rfl:  .  cetirizine HCl (ZYRTEC) 5 MG/5ML SYRP, Take by mouth., Disp: , Rfl:  .  Cholecalciferol (VITAMIN D3) 5000 units CAPS, , Disp: , Rfl: 0 .  colchicine 0.6 MG tablet, Take 0.6 mg by mouth 2 (two) times daily., Disp: , Rfl:  .  diphenhydrAMINE (BENADRYL) 25 mg capsule, Take 25 mg by mouth 2 (two) times daily as needed for  itching or allergies. , Disp: , Rfl:  .  febuxostat (ULORIC) 40 MG tablet, Take 40 mg by mouth daily., Disp: , Rfl:  .  furosemide (LASIX) 20 MG tablet, Take 20 mg by mouth 2 (two) times daily., Disp: , Rfl:  .  Gabapentin, Once-Daily, 600 MG TABS, Take 1,800 mg by mouth at bedtime., Disp: 90 tablet, Rfl: 11 .  IRON PO, Take by mouth., Disp: , Rfl:  .  levothyroxine (SYNTHROID, LEVOTHROID) 200 MCG tablet, Take 200 mcg by mouth daily before breakfast., Disp: , Rfl:  .  Multiple Vitamin (MULTIVITAMIN WITH MINERALS) TABS tablet, Take 1 tablet by mouth daily., Disp: , Rfl:  .  Multiple Vitamins-Minerals (HAIR/SKIN/NAILS/BIOTIN PO), Take 1 tablet by mouth 2 (two) times daily. , Disp: , Rfl:  .  ondansetron (ZOFRAN-ODT) 8 MG disintegrating tablet, Take 8 mg by mouth every 8 (eight) hours as needed for nausea or vomiting., Disp: , Rfl:  .  Oxcarbazepine (TRILEPTAL) 300 MG tablet, Take 2 tablets (600 mg total) by mouth 2 (two) times daily. Take 2 tablets at dinner and 2 tablets at bed time, Disp: 120 tablet, Rfl: 2 .  potassium chloride SA (K-DUR,KLOR-CON) 20 MEQ tablet, Take 20 mEq by mouth 2 (two) times daily., Disp: , Rfl:  .  TRINTELLIX 20 MG TABS, Take 1 tablet by mouth daily., Disp: , Rfl: 5  Axis Diagnosis:  Bipolar 2  disorder.  ADHD imattentive   Level of Care:  IOP  Discharge destination:  Other:  referred back to her psychiatrist and therapist.  she is thinking whether she wants to change provider and therapist but encouraged to keep the ones she has   Is patient on multiple antipsychotic therapies at discharge:  No    Has Patient had three or more failed trials of antipsychotic monotherapy by history:  Negative  Patient phone:  [phone] (home)  Patient address:   [address] [place] [address],   Follow-up recommendations:  Activity:  continue current activity Diet:  continue current diet Recommended to follow up at [facility] program Comments:   none  The patient received suicide prevention pamphlet:  Yes  Carolanne Grumbling 04/02/2015, 1:25 PM

## 2015-04-03 ENCOUNTER — Other Ambulatory Visit (HOSPITAL_COMMUNITY): Payer: BLUE CROSS/BLUE SHIELD | Admitting: Psychiatry

## 2015-04-03 NOTE — Progress Notes (Signed)
Renee CostaKathryn [name] is a 40 y.o. , single, unemployed, Caucasian female who was [facility] [place]. Pt was admitted there from 12-02-14 until 02-18-15; treatment for wosening anxiety and depressive symptoms, eating disorder, with passive SI, along with hx of ETOH use. Denies SI/HI or A/V hallucinations. Pt was in MH-IOP 11-18-13 thru 01-16-14. CC: previous chart. Pt attended MH-IOP since 02-24-15; with multiple absences.  Pt has requested to be referred to a partial program for continued structure and coping skills.  Stressors: 1) Unemployment which is putting a strain on her finances. 2) Poor communication and boundaries with partner and family.   A:  D/C today.  F/U with Dr. Evelene CroonKaur on 04-27-15 @ 6 pm.  [facility]; pt will then f/u with Ancil LinseyJade Larkin, LCSW or therapist of choice.  Strongly recommended DBT groups/therapy.  R:  Pt receptive.        Chestine SporeLARK, Renee, M.Ed, CNA

## 2015-04-03 NOTE — Patient Instructions (Signed)
Patient completed MH-IOP today.  Patient will follow up with Partial Program in a couple of weeks.  Appointment with Dr. Evelene CroonKaur on 04-27-15 @ 6 pm.  Once discharged from Partial patient will then schedule an appointment with Ancil LinseyJade Larkin, LCSW.  Encouraged support groups.

## 2015-04-03 NOTE — Progress Notes (Signed)
    Daily Group Progress Note  Program: IOP  Group Time: 9:00-10:30  Participation Level: Active  Behavioral Response: Appropriate  Type of Therapy:  Psycho-education Group  Summary of Progress: Pt. Participated in discharge planning group with the case manager. Pt. Planning for discharge tomorrow.     Group Time: 10:30-12:00  Participation Level:  Active  Behavioral Response: Appropriate  Type of Therapy: Group Therapy  Summary of Progress: Pt. Continues to present as anxious, hyperverbal and needing redirection. Pt. Shared that she continues to grieve loss of her brother and she continues to process problems in relationship with her significant other.  Shaune PollackBrown, Jennifer B, LPC

## 2015-04-06 ENCOUNTER — Other Ambulatory Visit (HOSPITAL_COMMUNITY): Payer: BLUE CROSS/BLUE SHIELD

## 2015-04-07 ENCOUNTER — Other Ambulatory Visit (HOSPITAL_COMMUNITY): Payer: BLUE CROSS/BLUE SHIELD

## 2015-04-28 DIAGNOSIS — F3181 Bipolar II disorder: Secondary | ICD-10-CM | POA: Diagnosis not present

## 2015-05-12 DIAGNOSIS — F900 Attention-deficit hyperactivity disorder, predominantly inattentive type: Secondary | ICD-10-CM | POA: Diagnosis not present

## 2015-05-12 DIAGNOSIS — F3181 Bipolar II disorder: Secondary | ICD-10-CM | POA: Diagnosis not present

## 2015-05-12 DIAGNOSIS — F410 Panic disorder [episodic paroxysmal anxiety] without agoraphobia: Secondary | ICD-10-CM | POA: Diagnosis not present

## 2015-05-28 ENCOUNTER — Institutional Professional Consult (permissible substitution): Payer: Self-pay | Admitting: Family Medicine

## 2015-06-01 ENCOUNTER — Telehealth: Payer: Self-pay | Admitting: Medical

## 2015-06-01 ENCOUNTER — Encounter: Payer: Self-pay | Admitting: Family Medicine

## 2015-06-01 NOTE — Telephone Encounter (Signed)
Records received from previous provider. Pt has a appt on 06/10/2015. Sending back for review. Please note what needs to be abstracted, scanned or filed.

## 2015-06-02 ENCOUNTER — Institutional Professional Consult (permissible substitution): Payer: Self-pay | Admitting: Family Medicine

## 2015-06-08 ENCOUNTER — Other Ambulatory Visit (HOSPITAL_COMMUNITY): Payer: Self-pay | Admitting: Medical

## 2015-06-10 ENCOUNTER — Institutional Professional Consult (permissible substitution): Payer: Self-pay | Admitting: Medical

## 2015-06-15 ENCOUNTER — Institutional Professional Consult (permissible substitution): Payer: Self-pay | Admitting: Medical

## 2015-06-17 ENCOUNTER — Institutional Professional Consult (permissible substitution): Payer: Self-pay | Admitting: Medical

## 2015-06-18 ENCOUNTER — Telehealth: Payer: Self-pay

## 2015-06-18 ENCOUNTER — Ambulatory Visit (INDEPENDENT_AMBULATORY_CARE_PROVIDER_SITE_OTHER): Payer: BLUE CROSS/BLUE SHIELD | Admitting: Medical

## 2015-06-18 ENCOUNTER — Encounter: Payer: Self-pay | Admitting: Medical

## 2015-06-18 VITALS — BP 110/70 | HR 114 | Ht 67.25 in | Wt 277.0 lb

## 2015-06-18 DIAGNOSIS — M2550 Pain in unspecified joint: Secondary | ICD-10-CM | POA: Diagnosis not present

## 2015-06-18 DIAGNOSIS — F509 Eating disorder, unspecified: Secondary | ICD-10-CM | POA: Diagnosis not present

## 2015-06-18 DIAGNOSIS — H9193 Unspecified hearing loss, bilateral: Secondary | ICD-10-CM

## 2015-06-18 DIAGNOSIS — M542 Cervicalgia: Secondary | ICD-10-CM | POA: Diagnosis not present

## 2015-06-18 DIAGNOSIS — Z62810 Personal history of physical and sexual abuse in childhood: Secondary | ICD-10-CM

## 2015-06-18 DIAGNOSIS — G5603 Carpal tunnel syndrome, bilateral upper limbs: Secondary | ICD-10-CM | POA: Diagnosis not present

## 2015-06-18 DIAGNOSIS — M5010 Cervical disc disorder with radiculopathy, unspecified cervical region: Secondary | ICD-10-CM

## 2015-06-18 DIAGNOSIS — Z79899 Other long term (current) drug therapy: Secondary | ICD-10-CM | POA: Diagnosis not present

## 2015-06-18 DIAGNOSIS — F411 Generalized anxiety disorder: Secondary | ICD-10-CM | POA: Diagnosis not present

## 2015-06-18 DIAGNOSIS — G894 Chronic pain syndrome: Secondary | ICD-10-CM

## 2015-06-18 DIAGNOSIS — M1A9XX1 Chronic gout, unspecified, with tophus (tophi): Secondary | ICD-10-CM

## 2015-06-18 DIAGNOSIS — Z803 Family history of malignant neoplasm of breast: Secondary | ICD-10-CM | POA: Diagnosis not present

## 2015-06-18 DIAGNOSIS — F3175 Bipolar disorder, in partial remission, most recent episode depressed: Secondary | ICD-10-CM

## 2015-06-18 DIAGNOSIS — M797 Fibromyalgia: Secondary | ICD-10-CM

## 2015-06-18 DIAGNOSIS — E039 Hypothyroidism, unspecified: Secondary | ICD-10-CM

## 2015-06-18 DIAGNOSIS — IMO0002 Reserved for concepts with insufficient information to code with codable children: Secondary | ICD-10-CM
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
## 2015-06-18 NOTE — Telephone Encounter (Signed)
Pt called the office to let us know that she gave wrong mg of Uloric. Original documented was 40mg , updated to 80mg  per pt. Just FYI. Trixie Rude/RLB

## 2015-06-18 NOTE — Telephone Encounter (Signed)
Spoke to pharmacist stated that patient is a regular because she has so many RX's. Stated they have noticed nothing that would raise a flag to them regarding her Soma. Stated that her prior dr would only fill for 10 days at a time so she constantly had to return in order to pick that up. The same is done with her Gabapentin. Pharmacist stated that they would get a refill for 10 days then the next time 20 days then back to 10, with no type of consistency.

## 2015-06-19 ENCOUNTER — Encounter: Payer: Self-pay | Admitting: Medical

## 2015-06-19 DIAGNOSIS — M542 Cervicalgia: Secondary | ICD-10-CM | POA: Insufficient documentation

## 2015-06-19 DIAGNOSIS — M5010 Cervical disc disorder with radiculopathy, unspecified cervical region: Secondary | ICD-10-CM | POA: Insufficient documentation

## 2015-06-19 DIAGNOSIS — H9190 Unspecified hearing loss, unspecified ear: Secondary | ICD-10-CM | POA: Insufficient documentation

## 2015-06-19 DIAGNOSIS — G894 Chronic pain syndrome: Secondary | ICD-10-CM | POA: Insufficient documentation

## 2015-06-19 DIAGNOSIS — M797 Fibromyalgia: Secondary | ICD-10-CM | POA: Insufficient documentation

## 2015-06-19 DIAGNOSIS — E039 Hypothyroidism, unspecified: Secondary | ICD-10-CM | POA: Insufficient documentation

## 2015-06-19 DIAGNOSIS — Z803 Family history of malignant neoplasm of breast: Secondary | ICD-10-CM | POA: Insufficient documentation

## 2015-06-19 DIAGNOSIS — F509 Eating disorder, unspecified: Secondary | ICD-10-CM | POA: Insufficient documentation

## 2015-06-19 DIAGNOSIS — G5603 Carpal tunnel syndrome, bilateral upper limbs: Secondary | ICD-10-CM | POA: Insufficient documentation

## 2015-06-19 DIAGNOSIS — F411 Generalized anxiety disorder: Secondary | ICD-10-CM | POA: Insufficient documentation

## 2015-06-19 DIAGNOSIS — Z79899 Other long term (current) drug therapy: Secondary | ICD-10-CM | POA: Insufficient documentation

## 2015-06-19 DIAGNOSIS — M1A9XX0 Chronic gout, unspecified, without tophus (tophi): Secondary | ICD-10-CM | POA: Insufficient documentation

## 2015-06-19 DIAGNOSIS — M2550 Pain in unspecified joint: Secondary | ICD-10-CM | POA: Insufficient documentation

## 2015-06-19 DIAGNOSIS — IMO0002 Reserved for concepts with insufficient information to code with codable children: Secondary | ICD-10-CM | POA: Insufficient documentation

## 2015-06-19 DIAGNOSIS — F3175 Bipolar disorder, in partial remission, most recent episode depressed: Secondary | ICD-10-CM | POA: Insufficient documentation

## 2015-06-19 MED ORDER — CARISOPRODOL 350 MG PO TABS: 350.0000 mg | ORAL_TABLET | Freq: Four times a day (QID) | ORAL | Status: DC | PRN

## 2015-06-30 DIAGNOSIS — F3342 Major depressive disorder, recurrent, in full remission: Secondary | ICD-10-CM | POA: Diagnosis not present

## 2015-06-30 DIAGNOSIS — F410 Panic disorder [episodic paroxysmal anxiety] without agoraphobia: Secondary | ICD-10-CM | POA: Diagnosis not present

## 2015-06-30 DIAGNOSIS — F900 Attention-deficit hyperactivity disorder, predominantly inattentive type: Secondary | ICD-10-CM | POA: Diagnosis not present

## 2015-07-09 ENCOUNTER — Telehealth: Payer: Self-pay

## 2015-07-09 NOTE — Telephone Encounter (Signed)
Pt called the office to request BUN/creat, TSH, K+. Pt questions if she needs to fast for labs on 07/15/2015. Please call pt back at [phone].

## 2015-07-09 NOTE — Telephone Encounter (Signed)
I had seen her recently to establish care, and recommend f/u or physical in a month at which time we can do those labs.  Was there a specific reason she wanted these labs or was another doctor sending us a lab order?

## 2015-07-10 NOTE — Telephone Encounter (Signed)
Pt informed

## 2015-07-10 NOTE — Telephone Encounter (Signed)
Pt has 1 month f/u scheduled for 6/21, she states that her mom recently had issues with the lasix and her kidney function so this has her worried since she is taking lasix. Also she mentioned potassium because she takes chlorthalidone. Wants thyroid rechecked because it has been a while since last check per pt. Trixie Rude/RLB

## 2015-07-10 NOTE — Telephone Encounter (Signed)
Lets just plan to do the labs at the f/u

## 2015-07-10 NOTE — Telephone Encounter (Signed)
LMTCB

## 2015-07-15 ENCOUNTER — Ambulatory Visit (INDEPENDENT_AMBULATORY_CARE_PROVIDER_SITE_OTHER): Payer: BLUE CROSS/BLUE SHIELD | Admitting: Medical

## 2015-07-15 ENCOUNTER — Encounter: Payer: Self-pay | Admitting: Medical

## 2015-07-15 VITALS — BP 120/80 | HR 78 | Wt 279.0 lb

## 2015-07-15 DIAGNOSIS — R609 Edema, unspecified: Secondary | ICD-10-CM

## 2015-07-15 DIAGNOSIS — E039 Hypothyroidism, unspecified: Secondary | ICD-10-CM | POA: Diagnosis not present

## 2015-07-15 DIAGNOSIS — M797 Fibromyalgia: Secondary | ICD-10-CM

## 2015-07-15 DIAGNOSIS — M1A9XX1 Chronic gout, unspecified, with tophus (tophi): Secondary | ICD-10-CM

## 2015-07-15 DIAGNOSIS — I10 Essential (primary) hypertension: Secondary | ICD-10-CM | POA: Diagnosis not present

## 2015-07-15 DIAGNOSIS — F17200 Nicotine dependence, unspecified, uncomplicated: Secondary | ICD-10-CM | POA: Diagnosis not present

## 2015-07-15 LAB — CBC
HEMATOCRIT: 43 % (ref 35.0–45.0)
HEMOGLOBIN: 13.9 g/dL (ref 11.7–15.5)
MCH: 31.1 pg (ref 27.0–33.0)
MCHC: 32.3 g/dL (ref 32.0–36.0)
MCV: 96.2 fL (ref 80.0–100.0)
MPV: 9.9 fL (ref 7.5–12.5)
Platelets: 285 10*3/uL (ref 140–400)
RBC: 4.47 MIL/uL (ref 3.80–5.10)
RDW: 13.7 % (ref 11.0–15.0)
WBC: 7 10*3/uL (ref 4.0–10.5)

## 2015-07-15 LAB — COMPREHENSIVE METABOLIC PANEL
ALT: 19 U/L (ref 6–29)
AST: 20 U/L (ref 10–30)
Albumin: 4 g/dL (ref 3.6–5.1)
Alkaline Phosphatase: 73 U/L (ref 33–115)
BUN: 20 mg/dL (ref 7–25)
CHLORIDE: 105 mmol/L (ref 98–110)
CO2: 28 mmol/L (ref 20–31)
CREATININE: 0.77 mg/dL (ref 0.50–1.10)
Calcium: 9.4 mg/dL (ref 8.6–10.2)
GLUCOSE: 92 mg/dL (ref 65–99)
POTASSIUM: 5.2 mmol/L (ref 3.5–5.3)
SODIUM: 140 mmol/L (ref 135–146)
Total Bilirubin: 0.3 mg/dL (ref 0.2–1.2)
Total Protein: 6.4 g/dL (ref 6.1–8.1)

## 2015-07-15 LAB — LIPID PANEL
CHOL/HDL RATIO: 4.8 ratio (ref <=5.0)
Cholesterol: 244 mg/dL — ABNORMAL HIGH (ref 125–200)
HDL: 51 mg/dL (ref >=46)
LDL CALC: 172 mg/dL — AB (ref <130)
Triglycerides: 106 mg/dL (ref <150)
VLDL: 21 mg/dL (ref <30)

## 2015-07-15 LAB — URIC ACID: URIC ACID, SERUM: 3.4 mg/dL (ref 2.5–7.0)

## 2015-07-15 LAB — TSH: TSH: 15.06 mIU/L — ABNORMAL HIGH

## 2015-07-15 LAB — T4, FREE: FREE T4: 1 ng/dL (ref 0.8–1.8)

## 2015-07-15 MED ORDER — NICOTINE 21 MG/24HR TD PT24: 21.0000 mg | MEDICATED_PATCH | Freq: Every day | TRANSDERMAL | Status: DC

## 2015-07-15 MED ORDER — CARISOPRODOL 350 MG PO TABS: 350.0000 mg | ORAL_TABLET | Freq: Four times a day (QID) | ORAL | Status: DC | PRN

## 2015-07-15 NOTE — Progress Notes (Signed)
Subjective: Chief Complaint  Patient presents with  . Follow-up    is quitting smoking, july 1st. stated that she needs refills. joining the [employer]. would like to try saxenda. nicotene patch?   Has felt somewhat feverish and clammy, hot cold alternating last 24 hours, some nausea.   Feels kind of crappy today.  She is helping take care of her mother who is under treatment for breast cancer.  Her and mother are going to try and quit tobacco together.    Is fasting today.   Wants labs today to check thyroid, kidney function, potassium levels checked.    Wants refill on Soma.  Would like to keep this at TID for the next month as she is working on exercise, smoking cessation.   She is joining swimming club to get exercise with swimming.    Is having some left low back pain, pulls with walking or standing or sitting, gets pull in the muscle.   Since last visit Lamictal has been weaned down.  Her girlfriend is on Humira and Methotrexate.  She understands the process of injecting medication.  She is curious about using Saxenda to help with weight loss.  She has 17lb gain in weight in the past year.  Was in eating disorder clinic from 11/2014-01/2015  No other c/o.   Past Medical History  Diagnosis Date  . Hypothyroidism   . Hypertension   . Anxiety and depression     followed by Dr. Sharl MaKerr and Kennith Centerracey at [facility]  . ADD (attention deficit disorder with hyperactivity)   . GERD (gastroesophageal reflux disease)   . Anxiety   . Depression   . Anemia   . Dermatitis   . DDD (degenerative disc disease), cervical 02/24/2015    C4 foraminal narrowing-chronic pain   . Bipolar disorder [facility](HCC)     sees Dr. Milagros Evenerupinder Kaur  . Edema   . Urinary incontinence   . Allergy   . Skin abnormalities     sees [facility]  . Fibromyalgia   . Chronic pain   . Polyarthralgia   . Herpes simplex   . Chronic nausea   . Hearing difficulty   . Migraines    Current Outpatient  Prescriptions on File Prior to Visit  Medication Sig Dispense Refill  . ALPRAZolam (XANAX) 1 MG tablet Take 1 mg by mouth 2 (two) times daily. .5mg  at 5pm and 1 tab at hs    . amphetamine-dextroamphetamine (ADDERALL) 30 MG tablet Take 1 tablet by mouth 2 (two) times daily as needed (for add). 60 tablet 0  . celecoxib (CELEBREX) 200 MG capsule Take 200 mg by mouth 2 (two) times daily.    . cetirizine HCl (ZYRTEC) 5 MG/5ML SYRP Take by mouth.    . Cholecalciferol (VITAMIN D3) 5000 units CAPS Reported on 06/18/2015  0  . colchicine 0.6 MG tablet Take 0.6 mg by mouth 2 (two) times daily.    . febuxostat (ULORIC) 40 MG tablet Take 80 mg by mouth daily.     . furosemide (LASIX) 20 MG tablet Take 20 mg by mouth 2 (two) times daily.    . Gabapentin, Once-Daily, 600 MG TABS Take 1,800 mg by mouth at bedtime. 90 tablet 11  . IRON PO Take by mouth. Reported on 06/18/2015    . levothyroxine (SYNTHROID, LEVOTHROID) 200 MCG tablet Take 200 mcg by mouth daily before breakfast.    . Multiple Vitamin (MULTIVITAMIN WITH MINERALS) TABS tablet Take 1 tablet by mouth daily.  Reported on 06/18/2015    . potassium chloride SA (K-DUR,KLOR-CON) 20 MEQ tablet Take 20 mEq by mouth 2 (two) times daily.    Marland Kitchen acetaminophen (TYLENOL) 500 MG tablet Take 1,000 mg by mouth every 6 (six) hours as needed (Pain). Reported on 07/15/2015    . Calcium Citrate-Vitamin D (CALCIUM CITRATE + D3 PO) Take 2 tablets by mouth 3 (three) times daily. Reported on 07/15/2015    . diphenhydrAMINE (BENADRYL) 25 mg capsule Take 25 mg by mouth 2 (two) times daily as needed for itching or allergies. Reported on 07/15/2015    . ondansetron (ZOFRAN-ODT) 8 MG disintegrating tablet Take 8 mg by mouth every 8 (eight) hours as needed for nausea or vomiting. Reported on 07/15/2015     No current facility-administered medications on file prior to visit.     ROS as in subjective  Objective: BP 120/80 mmHg  Pulse 78  Wt 279 lb (126.554 kg)  LMP  07/08/2015  Wt Readings from Last 3 Encounters:  07/15/15 279 lb (126.554 kg)  06/18/15 277 lb (125.646 kg)  02/23/15 276 lb 9.6 oz (125.465 kg)   Gen: wd, wn, nad Otherwise not examined   Assessment: Encounter Diagnoses  Name Primary?  . Tobacco use disorder Yes  . Hypothyroidism, unspecified hypothyroidism type   . Essential hypertension   . Edema, unspecified type   . Morbid obesity, unspecified obesity type (HCC)   . Chronic gout with tophus, unspecified cause, unspecified site   . Fibromyalgia      Plan: Labs today  Refilled Soma for muscle spasm, chronic pain, but advised I didn't feel comfortable going back up to QID dosing of Soma that she was on prior, and alternately we need to work to come down on the dose.    Refilled TID for now.  Was 262 lb coming out of eating disorder clinic from 11/2015-01/2015.   Pending labs, consider Saxenda trial   Tobacco use - begin nicotine patch, discuss weaning off tobacco, and plan to change to  patch in a month.  There are no Patient Instructions on file for this visit. Daisia was seen today for follow-up.  Diagnoses and all orders for this visit:  Tobacco use disorder -     CBC  Hypothyroidism, unspecified hypothyroidism type -     Comprehensive metabolic panel -     TSH -     T4, free -     CBC  Essential hypertension -     Comprehensive metabolic panel -     Hemoglobin A1c -     Lipid panel  Edema, unspecified type -     Comprehensive metabolic panel  Morbid obesity, unspecified obesity type (HCC) -     Hemoglobin A1c -     T4, free  Chronic gout with tophus, unspecified cause, unspecified site -     CBC -     Uric acid  Fibromyalgia -     Comprehensive metabolic panel -     CBC -     Uric acid  Other orders -     nicotine (NICODERM CQ - DOSED IN MG/24 HOURS) 21 mg/24hr patch; Place 1 patch (21 mg total) onto the skin daily. -     carisoprodol (SOMA) 350 MG tablet; Take 1 tablet (350 mg total) by  mouth 4 (four) times daily as needed for muscle spasms.   Spent > 30 minutes face to face with patient in discussion of symptoms, evaluation, plan and recommendations.

## 2015-07-16 ENCOUNTER — Other Ambulatory Visit: Payer: Self-pay | Admitting: Medical

## 2015-07-16 LAB — HEMOGLOBIN A1C
Hgb A1c MFr Bld: 5.1 % (ref <5.7)
Mean Plasma Glucose: 100 mg/dL

## 2015-07-16 MED ORDER — LIRAGLUTIDE -WEIGHT MANAGEMENT 18 MG/3ML ~~LOC~~ SOPN: 1.8000 mg | PEN_INJECTOR | Freq: Every day | SUBCUTANEOUS | Status: DC

## 2015-07-16 MED ORDER — SYNTHROID 25 MCG PO TABS: 25.0000 ug | ORAL_TABLET | Freq: Every day | ORAL | Status: DC

## 2015-07-16 MED ORDER — SYNTHROID 200 MCG PO TABS: 200.0000 ug | ORAL_TABLET | Freq: Every day | ORAL | Status: DC

## 2015-07-27 ENCOUNTER — Other Ambulatory Visit: Payer: Self-pay | Admitting: Medical

## 2015-07-27 NOTE — Telephone Encounter (Signed)
Is this okay to refill? 

## 2015-07-28 ENCOUNTER — Telehealth: Payer: Self-pay | Admitting: Medical

## 2015-07-28 NOTE — Telephone Encounter (Signed)
P.A. SAXENDA  

## 2015-07-30 NOTE — Telephone Encounter (Signed)
P.A. Gibson Rampenied, pt needs trial of Belviq & Qsymia.  Do you want to switch?

## 2015-08-02 NOTE — Telephone Encounter (Signed)
Saxenda not approved by insurance.   unfortunately there are not other medication I would recommend a this time.  The other available options aren't safe for her given the other medication she is taking.   I recommend a daily exercise regiment such as walking, biking, swimming, and being careful with diet, trying to star around 1800 calories per day for now.  I think weight loss efforts work best if she sets goals such as losing 10 lb in the next 4-6 wk or trying to lose down a pant size in the next month.    Have her call back in 70mo with update on her efforts.

## 2015-08-03 NOTE — Telephone Encounter (Signed)
LMTCB

## 2015-08-03 NOTE — Telephone Encounter (Signed)
Pt is aware and has appt. She is having a lot of trouble staying at three a day and is leaning more towards the four a day. Wanted to make sure you where aware of that

## 2015-08-11 ENCOUNTER — Ambulatory Visit: Payer: BLUE CROSS/BLUE SHIELD | Admitting: Medical

## 2015-09-03 ENCOUNTER — Other Ambulatory Visit: Payer: Self-pay | Admitting: Medical

## 2015-09-05 ENCOUNTER — Other Ambulatory Visit: Payer: Self-pay | Admitting: Medical

## 2015-09-09 ENCOUNTER — Telehealth: Payer: Self-pay | Admitting: Medical

## 2015-09-09 ENCOUNTER — Other Ambulatory Visit: Payer: Self-pay | Admitting: Medical

## 2015-09-09 MED ORDER — CELECOXIB 200 MG PO CAPS: 200.0000 mg | ORAL_CAPSULE | Freq: Two times a day (BID) | ORAL | 1 refills | Status: DC

## 2015-09-09 NOTE — Telephone Encounter (Signed)
Pt called and was requesting a refill on her celebrex, pt made a follow up appt for 09/22/2015, she is very concerned about her medicine and wants to know if you will refill it for her,she states she will def be here for her appt, pt can be reached at [phone] (M

## 2015-09-09 NOTE — Telephone Encounter (Signed)
Is this ok to refill?  

## 2015-09-09 NOTE — Telephone Encounter (Signed)
done

## 2015-09-15 ENCOUNTER — Other Ambulatory Visit: Payer: Self-pay | Admitting: Medical

## 2015-09-15 ENCOUNTER — Telehealth: Payer: Self-pay | Admitting: Medical

## 2015-09-15 NOTE — Telephone Encounter (Signed)
Requesting refill on Gabapentin 600 mg & Soma 350 mg. Pt has an appt with Vincenza HewsShane on 8/29 but is out of Gabapentin now and will be out of Soma on 8/26

## 2015-09-15 NOTE — Telephone Encounter (Signed)
Pinole looks like this is 600 mg per day. Clear this up

## 2015-09-15 NOTE — Telephone Encounter (Signed)
Is this okay to refill. Looks like [facility] refilled it for 90 tablets for a year at 3 times a day but back in June it was written down 4x a day and that is what the request is for?

## 2015-09-15 NOTE — Telephone Encounter (Signed)
Talked to pharmacist last refill on Gabapentin was August 05 2015 600 mg 4 x a day by Dr.Stoneking I told the pharmacist that Tysinger was out of the office till next week that since he has never filled it for her than he could make that decision on that also the pharmacist told her that she had just picked up the soma that they were not going to request it for her

## 2015-09-15 NOTE — Telephone Encounter (Signed)
She should not need the soma and clear up the dosing on the gabapentin

## 2015-09-16 MED ORDER — CELECOXIB 200 MG PO CAPS: 200.0000 mg | ORAL_CAPSULE | Freq: Two times a day (BID) | ORAL | 0 refills | Status: DC

## 2015-09-16 NOTE — Telephone Encounter (Signed)
Pt called to see if her refills would be sent since they are not there yet & she has not heard otherwise. I confirmed with Cheri that meds are going to sent since Dr Pete GlatterStoneking refilled Gabapentin last time. Pt confirmed that she is not seeing Dr Pete GlatterStoneking. He was he previous Dr & the pharmacy has not removed him from her profile. Pt need Gabapentin & Soma until her appt on Monday if at all possible

## 2015-09-16 NOTE — Telephone Encounter (Signed)
Make sure the gabapentin 4 times per day, Soma and Celebrex have been called in. Make sure she has enough for about a month and [facility]

## 2015-09-22 ENCOUNTER — Ambulatory Visit (INDEPENDENT_AMBULATORY_CARE_PROVIDER_SITE_OTHER): Payer: BLUE CROSS/BLUE SHIELD | Admitting: Medical

## 2015-09-22 ENCOUNTER — Encounter: Payer: Self-pay | Admitting: Medical

## 2015-09-22 VITALS — BP 130/84 | HR 88 | Wt 283.0 lb

## 2015-09-22 DIAGNOSIS — M1A9XX1 Chronic gout, unspecified, with tophus (tophi): Secondary | ICD-10-CM | POA: Diagnosis not present

## 2015-09-22 DIAGNOSIS — F332 Major depressive disorder, recurrent severe without psychotic features: Secondary | ICD-10-CM

## 2015-09-22 DIAGNOSIS — M797 Fibromyalgia: Secondary | ICD-10-CM

## 2015-09-22 DIAGNOSIS — M542 Cervicalgia: Secondary | ICD-10-CM | POA: Diagnosis not present

## 2015-09-22 DIAGNOSIS — Z79899 Other long term (current) drug therapy: Secondary | ICD-10-CM

## 2015-09-22 DIAGNOSIS — F3175 Bipolar disorder, in partial remission, most recent episode depressed: Secondary | ICD-10-CM

## 2015-09-22 DIAGNOSIS — E039 Hypothyroidism, unspecified: Secondary | ICD-10-CM | POA: Diagnosis not present

## 2015-09-22 DIAGNOSIS — G894 Chronic pain syndrome: Secondary | ICD-10-CM

## 2015-09-22 DIAGNOSIS — G5603 Carpal tunnel syndrome, bilateral upper limbs: Secondary | ICD-10-CM

## 2015-09-22 DIAGNOSIS — M5030 Other cervical disc degeneration, unspecified cervical region: Secondary | ICD-10-CM

## 2015-09-22 DIAGNOSIS — M5010 Cervical disc disorder with radiculopathy, unspecified cervical region: Secondary | ICD-10-CM

## 2015-09-22 MED ORDER — FUROSEMIDE 40 MG PO TABS: 40.0000 mg | ORAL_TABLET | Freq: Every day | ORAL | 2 refills | Status: DC

## 2015-09-22 MED ORDER — POTASSIUM CHLORIDE CRYS ER 20 MEQ PO TBCR: 20.0000 meq | EXTENDED_RELEASE_TABLET | Freq: Every day | ORAL | 2 refills | Status: DC

## 2015-09-22 MED ORDER — CARISOPRODOL 350 MG PO TABS: 350.0000 mg | ORAL_TABLET | Freq: Three times a day (TID) | ORAL | 1 refills | Status: DC

## 2015-09-22 MED ORDER — FEBUXOSTAT 80 MG PO TABS: 1.0000 | ORAL_TABLET | Freq: Every day | ORAL | 1 refills | Status: DC

## 2015-09-22 NOTE — Progress Notes (Signed)
Subjective: Chief Complaint  Patient presents with  . Follow-up    wants to also discuss weight gain. quit smoking, over a month ago. was off antidepressant for 2 months and was off of uloric for a month, because she had inurance issues.    Here for f/u.    Last visit we checked labs, had her start nicotine patch for smoking cessation, we discussed continued efforts to wean down on Soma.  Discussed trial of Saxenda but that wasn't covered by insurance.    Had recent issue with insurance, was out of some of her medications including uloric and antidepressant for a month since last visit.  Was able to quit smoking.    But given running out of antidepressant, stopping smoking, has gained more weight.  Currently going to pool some.  Is having lots of mosquito bites, so embarrassed to get in pool with skin lesions, mosquito bites.   Can't use insect repellant as it gives her skin breakouts. She showed me dozens of pictures of skin issues she's had prior and recently.    She notes that some medications need refilled.   Refills needed: Lasix, Uloric, Soma.   Gout - Uses Colchicine prn, not daily.   Is taking Uloric 80mg  daily. Sees counselor currently at Dr. Carie CaddyKaur's office.    Still dealing with brother's death.  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  overal she notes that she doesn't really have a good handle on how to deal with her issues.   She doesn't want to be on all these medications, but can't lose weight, knows she can't function without medications, still can do all the activity she wants to do with the neck issues, and is not mentally ready to work full time.  currently not working at all.  Was helping father, but he has no current renters so not helping with his rental houses currently.  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   She is current dealing with the recent diagnosis of breast cancer in her mother.   Her brother passed away a few years ago  unexpectedly and she turned to alcohol and dealt with alcohol abuse for a while.   Was seeing Dr. Merlene LaughterHal Stoneking prior for PCP.    She has chronic pain, numerous joint pains, chronic neck pain, and recent MRI, EMG[facility] this past year.  She was offered referral to pain clinic, but given the eating disorder, moms breast cancer diagnosis, this was put on hold.   She notes hx/o stenosis of the spine, bulging discs, chronic pain.  gabapentin helps her pains.  Feels like a 2x4 hanging over her shoulders wearing her down.    She is worried about weight begin 300 lb by her [age] birthday.  She has hx/o gastric bypass in 2008,lost 150lb but gained it back.   Past Medical History:  Diagnosis Date  . ADD (attention deficit disorder with hyperactivity)   . Allergy   . Anemia   . Anxiety and depression    followed by Dr. Evelene CroonKaur and Kennith Centerracey at [facility]  . Bipolar disorder [facility](HCC)    sees Dr. Milagros Evenerupinder Kaur  . Chronic nausea   . Chronic pain   . DDD (degenerative disc disease), cervical 02/24/2015   C4 foraminal narrowing-chronic pain   . Dermatitis   . Edema   . Fibromyalgia   . GERD (gastroesophageal reflux disease)   . Hearing difficulty   .  Herpes simplex   . Hypertension   . Hypothyroidism   . Migraines   . Polyarthralgia   . Skin abnormalities    sees [facility]  . Urinary incontinence    Current Outpatient Prescriptions on File Prior to Visit  Medication Sig Dispense Refill  . ALPRAZolam (XANAX) 1 MG tablet Take 1 mg by mouth 2 (two) times daily. .5mg  at 5pm and 1 tab at hs    . amphetamine-dextroamphetamine (ADDERALL) 30 MG tablet Take 1 tablet by mouth 2 (two) times daily as needed (for add). 60 tablet 0  . Calcium Citrate-Vitamin D (CALCIUM CITRATE + D3 PO) Take 2 tablets by mouth 3 (three) times daily. Reported on 07/15/2015    . celecoxib (CELEBREX) 200 MG capsule Take 1 capsule (200 mg total) by mouth 2 (two) times daily. 60 capsule  1  . celecoxib (CELEBREX) 200 MG capsule Take 1 capsule (200 mg total) by mouth 2 (two) times daily. 60 capsule 0  . cetirizine HCl (ZYRTEC) 5 MG/5ML SYRP Take by mouth.    . Cholecalciferol (VITAMIN D3) 5000 units CAPS Reported on 06/18/2015  0  . colchicine 0.6 MG tablet Take 0.6 mg by mouth 2 (two) times daily.    . diphenhydrAMINE (BENADRYL) 25 mg capsule Take 25 mg by mouth 2 (two) times daily as needed for itching or allergies. Reported on 07/15/2015    . gabapentin (NEURONTIN) 600 MG tablet TAKE 1 TABLET BY MOUTH FOUR TIMES DAILY 120 tablet 0  . IRON PO Take by mouth. Reported on 06/18/2015    . lamoTRIgine (LAMICTAL) 200 MG tablet Take 200 mg by mouth at bedtime.    . Multiple Vitamin (MULTIVITAMIN WITH MINERALS) TABS tablet Take 1 tablet by mouth daily. Reported on 06/18/2015    . nicotine (NICODERM CQ - DOSED IN MG/24 HOURS) 21 mg/24hr patch Place 1 patch (21 mg total) onto the skin daily. 28 patch 0  . ondansetron (ZOFRAN-ODT) 8 MG disintegrating tablet Take 8 mg by mouth every 8 (eight) hours as needed for nausea or vomiting. Reported on 07/15/2015    . SYNTHROID 200 MCG tablet Take 1 tablet (200 mcg total) by mouth daily before breakfast. 30 tablet 1  . SYNTHROID 25 MCG tablet TAKE 1 TABLET(25 MCG) BY MOUTH DAILY BEFORE BREAKFAST 30 tablet 0  . Vilazodone HCl (VIIBRYD) 40 MG TABS Take by mouth daily.    Marland Kitchen acetaminophen (TYLENOL) 500 MG tablet Take 1,000 mg by mouth every 6 (six) hours as needed (Pain). Reported on 07/15/2015     No current facility-administered medications on file prior to visit.    ROS as in subjective    Objective: BP 130/84   Pulse 88   Wt 283 lb (128.4 kg)   LMP 08/25/2015   BMI 44.00 kg/m   Wt Readings from Last 3 Encounters:  09/22/15 283 lb (128.4 kg)  07/15/15 279 lb (126.6 kg)  06/18/15 277 lb (125.6 kg)   Gen: wd, wn, nad Psych: pleasant, almost tearful at times , at other times dismissive with my remarks.      Assessment: Encounter  Diagnoses  Name Primary?  . Hypothyroidism, unspecified hypothyroidism type Yes  . Morbid obesity, unspecified obesity type (HCC)   . Severe recurrent major depression without psychotic features (HCC)   . Chronic pain syndrome   . Cervical disc disorder with radiculopathy of cervical region   . DDD (degenerative disc disease), cervical   . Fibromyalgia   . Bilateral carpal tunnel syndrome   .  Bipolar disorder, in partial remission, most recent episode depressed (HCC)   . Polypharmacy   . Chronic gout with tophus, unspecified cause, unspecified site   . Neck pain      Plan: Today's visit was really a chance to just give counseling and guidance about life in general.    I told her that I don't have a magic wand or easy solution for her issues.    A lot of her issues are psychological.   I recommend she set short and long term goals that are realistic and develop a plan to reach those goals.   I recommend she do some soul searching to help figure out what she wants out of life.  discussed finding her purpose in life, having hobbies or things she looks forward to doing.  Advised she find employment even if part time.   Advised that she has to get to a point to move past the loss of her brother and focus on doing rather than living in the moment dwelling on her woes, aches , pains, issues.   She has several medication problems is on numerous medications, but she seems to be spinning her wheels at the moment.    She did agree to one goal of losing 6-8lb in the next month.   Referral to Dr. Regino Schultze at [facility] for neck issues.  Refilled medications.  Sherly was seen today for follow-up.  Diagnoses and all orders for this visit:  Hypothyroidism, unspecified hypothyroidism type  Morbid obesity, unspecified obesity type (HCC)  Severe recurrent major depression without psychotic features (HCC)  Chronic pain syndrome -     Ambulatory referral to Orthopedic Surgery  Cervical disc disorder  with radiculopathy of cervical region  DDD (degenerative disc disease), cervical  Fibromyalgia  Bilateral carpal tunnel syndrome  Bipolar disorder, in partial remission, most recent episode depressed (HCC)  Polypharmacy  Chronic gout with tophus, unspecified cause, unspecified site  Neck pain  Other orders -     furosemide (LASIX) 40 MG tablet; Take 1 tablet (40 mg total) by mouth daily. -     potassium chloride SA (KLOR-CON M20) 20 MEQ tablet; Take 1 tablet (20 mEq total) by mouth daily. -     Febuxostat (ULORIC) 80 MG TABS; Take 1 tablet (80 mg total) by mouth daily. -     carisoprodol (SOMA) 350 MG tablet; Take 1 tablet (350 mg total) by mouth 3 (three) times daily.

## 2015-09-29 ENCOUNTER — Encounter: Payer: Self-pay | Admitting: Internal Medicine

## 2015-10-10 ENCOUNTER — Other Ambulatory Visit: Payer: Self-pay | Admitting: Medical

## 2015-10-12 ENCOUNTER — Other Ambulatory Visit: Payer: Self-pay | Admitting: Family Medicine

## 2015-10-12 ENCOUNTER — Other Ambulatory Visit: Payer: Self-pay | Admitting: Medical

## 2015-10-12 NOTE — Telephone Encounter (Signed)
Is this okay to refill? 

## 2015-10-19 DIAGNOSIS — M5412 Radiculopathy, cervical region: Secondary | ICD-10-CM | POA: Diagnosis not present

## 2015-11-02 ENCOUNTER — Institutional Professional Consult (permissible substitution): Payer: BLUE CROSS/BLUE SHIELD | Admitting: Medical

## 2015-11-03 NOTE — Telephone Encounter (Signed)
This encounter was created in error - please disregard.

## 2015-11-04 ENCOUNTER — Institutional Professional Consult (permissible substitution): Payer: BLUE CROSS/BLUE SHIELD | Admitting: Medical

## 2015-11-05 ENCOUNTER — Encounter: Payer: Self-pay | Admitting: Medical

## 2015-11-05 ENCOUNTER — Telehealth: Payer: Self-pay | Admitting: Medical

## 2015-11-05 ENCOUNTER — Other Ambulatory Visit: Payer: Self-pay

## 2015-11-05 ENCOUNTER — Ambulatory Visit (INDEPENDENT_AMBULATORY_CARE_PROVIDER_SITE_OTHER): Payer: BLUE CROSS/BLUE SHIELD | Admitting: Medical

## 2015-11-05 VITALS — BP 110/88 | HR 62 | Ht 67.25 in | Wt 282.5 lb

## 2015-11-05 DIAGNOSIS — M5010 Cervical disc disorder with radiculopathy, unspecified cervical region: Secondary | ICD-10-CM

## 2015-11-05 DIAGNOSIS — L659 Nonscarring hair loss, unspecified: Secondary | ICD-10-CM

## 2015-11-05 DIAGNOSIS — M797 Fibromyalgia: Secondary | ICD-10-CM | POA: Diagnosis not present

## 2015-11-05 DIAGNOSIS — M2550 Pain in unspecified joint: Secondary | ICD-10-CM

## 2015-11-05 DIAGNOSIS — I10 Essential (primary) hypertension: Secondary | ICD-10-CM

## 2015-11-05 DIAGNOSIS — K648 Other hemorrhoids: Secondary | ICD-10-CM | POA: Diagnosis not present

## 2015-11-05 DIAGNOSIS — K921 Melena: Secondary | ICD-10-CM

## 2015-11-05 DIAGNOSIS — O926 Galactorrhea: Secondary | ICD-10-CM | POA: Diagnosis not present

## 2015-11-05 DIAGNOSIS — R195 Other fecal abnormalities: Secondary | ICD-10-CM

## 2015-11-05 DIAGNOSIS — Z23 Encounter for immunization: Secondary | ICD-10-CM

## 2015-11-05 DIAGNOSIS — J0120 Acute ethmoidal sinusitis, unspecified: Secondary | ICD-10-CM

## 2015-11-05 DIAGNOSIS — W57XXXA Bitten or stung by nonvenomous insect and other nonvenomous arthropods, initial encounter: Secondary | ICD-10-CM | POA: Diagnosis not present

## 2015-11-05 DIAGNOSIS — K6289 Other specified diseases of anus and rectum: Secondary | ICD-10-CM | POA: Diagnosis not present

## 2015-11-05 DIAGNOSIS — N643 Galactorrhea not associated with childbirth: Secondary | ICD-10-CM

## 2015-11-05 DIAGNOSIS — E039 Hypothyroidism, unspecified: Secondary | ICD-10-CM | POA: Diagnosis not present

## 2015-11-05 DIAGNOSIS — G894 Chronic pain syndrome: Secondary | ICD-10-CM | POA: Diagnosis not present

## 2015-11-05 LAB — TSH: TSH: 1.2 m[IU]/L

## 2015-11-05 LAB — CBC
HEMATOCRIT: 46.9 % — AB (ref 35.0–45.0)
HEMOGLOBIN: 15.7 g/dL — AB (ref 11.7–15.5)
MCH: 31 pg (ref 27.0–33.0)
MCHC: 33.5 g/dL (ref 32.0–36.0)
MCV: 92.7 fL (ref 80.0–100.0)
MPV: 10.8 fL (ref 7.5–12.5)
Platelets: 281 10*3/uL (ref 140–400)
RBC: 5.06 MIL/uL (ref 3.80–5.10)
RDW: 13.5 % (ref 11.0–15.0)
WBC: 11.2 10*3/uL — ABNORMAL HIGH (ref 4.0–10.5)

## 2015-11-05 LAB — T4, FREE: FREE T4: 1.3 ng/dL (ref 0.8–1.8)

## 2015-11-05 MED ORDER — HYDROCORTISONE ACETATE 25 MG RE SUPP: 25.0000 mg | Freq: Two times a day (BID) | RECTAL | 0 refills | Status: DC

## 2015-11-05 MED ORDER — DOXYCYCLINE HYCLATE 100 MG PO TABS: 100.0000 mg | ORAL_TABLET | Freq: Two times a day (BID) | ORAL | 0 refills | Status: DC

## 2015-11-05 MED ORDER — HYDROCORTISONE 2.5 % RE CREA: 1.0000 "application " | TOPICAL_CREAM | Freq: Two times a day (BID) | RECTAL | 0 refills | Status: DC

## 2015-11-05 NOTE — Telephone Encounter (Signed)
Please call.  I wrote for hydrocortisone generic suppository and hydrocortisone cream 2.5%.  See what pharmacist recommends that will be cheaper.  If they didn't come across as those generics, then I need to know how we can resolve this in the future.

## 2015-11-05 NOTE — Telephone Encounter (Signed)
Pt called and states that the rectal cream and the suppositorys that where sent in where not covered by her insurance they where 66 and over 100 dollars and was wondering if there was something else that could be sent in, pt uses [facility] [phone] - [place], [place] - [address] [facility] [address] [address] [facility] [address] [address] informed pt that shane was not in the office this afternoon and was out of the office tomorrow sent to him and dr Susann Givenslalonde,

## 2015-11-05 NOTE — Telephone Encounter (Signed)
l left message for pt to call back

## 2015-11-05 NOTE — Telephone Encounter (Signed)
Dr.Lalonde I talked with pharmacist he said her ins. dosent cover much the only thing comp.to it OTC would be prep H

## 2015-11-05 NOTE — Telephone Encounter (Signed)
Have her use an over-the-counter medicine called TUCKS

## 2015-11-05 NOTE — Progress Notes (Signed)
Subjective: Chief Complaint  Patient presents with  . Follow-up    hair loss,    Here for f/u.  Has several concerns.  Having some hair loss thinks its related to synthroid.  This happened about the time we changed to name brand Synthroid.  She also things hormones may be to blame.  When she was in her teens, had problem with changes in synthroid in the past.  She does note hx/o nipple discharge, saw specialist for this prior, advised it was benign.  No other know pituitary issue.  Has several mosquito bites on buttocks that are taking too long to heal  Has chronic neck pain, known DDD of C spine.  Saw Dr. Mina Marble for consult, but he can't do anything for her.  advised she go ahead and f/u with Dr. Lynann Bologna.  needs  referral to neurosurgery for ruptured discs.  For about the last month has had at least 2 loose stools daily, daily diarrhea.   No recent travel, no sick contacts.   Has rectal pain, hurts to wipe, has had hx/o fissure.  Has seen daily bright red blood pre rectum on tissue.    Has sinus infection, ear pressure, sinus pressure, headache, drainage x 2 weeks.   Wants to go back to Soma QID  Past Medical History:  Diagnosis Date  . ADD (attention deficit disorder with hyperactivity)   . Allergy   . Anemia   . Anxiety and depression    followed by Dr. Toy Care and Linus Orn at [facility]  . Bipolar disorder [facility])    sees Dr. Chucky May  . Chronic nausea   . Chronic pain   . DDD (degenerative disc disease), cervical 02/24/2015   C4 foraminal narrowing-chronic pain   . Dermatitis   . Edema   . Fibromyalgia   . GERD (gastroesophageal reflux disease)   . Hearing difficulty   . Herpes simplex   . Hypertension   . Hypothyroidism   . Migraines   . Polyarthralgia   . Skin abnormalities    sees [facility]  . Urinary incontinence    Current Outpatient Prescriptions on File Prior to Visit  Medication Sig Dispense Refill  . acetaminophen (TYLENOL) 500 MG tablet Take  1,000 mg by mouth every 6 (six) hours as needed (Pain). Reported on 07/15/2015    . ALPRAZolam (XANAX) 1 MG tablet Take 1 mg by mouth 2 (two) times daily. .87m at 5pm and 1 tab at hs    . amphetamine-dextroamphetamine (ADDERALL) 30 MG tablet Take 1 tablet by mouth 2 (two) times daily as needed (for add). 60 tablet 0  . Calcium Citrate-Vitamin D (CALCIUM CITRATE + D3 PO) Take 2 tablets by mouth 3 (three) times daily. Reported on 07/15/2015    . carisoprodol (SOMA) 350 MG tablet Take 1 tablet (350 mg total) by mouth 3 (three) times daily. 90 tablet 1  . celecoxib (CELEBREX) 200 MG capsule Take 1 capsule (200 mg total) by mouth 2 (two) times daily. 60 capsule 0  . cetirizine HCl (ZYRTEC) 5 MG/5ML SYRP Take by mouth.    . Cholecalciferol (VITAMIN D3) 5000 units CAPS Reported on 06/18/2015  0  . colchicine 0.6 MG tablet Take 0.6 mg by mouth 2 (two) times daily.    . diphenhydrAMINE (BENADRYL) 25 mg capsule Take 25 mg by mouth 2 (two) times daily as needed for itching or allergies. Reported on 07/15/2015    . Febuxostat (ULORIC) 80 MG TABS Take 1 tablet (80 mg total) by  mouth daily. 90 tablet 1  . furosemide (LASIX) 40 MG tablet Take 1 tablet (40 mg total) by mouth daily. 30 tablet 2  . gabapentin (NEURONTIN) 600 MG tablet TAKE 1 TABLET BY MOUTH FOUR TIMES DAILY 120 tablet 0  . IRON PO Take by mouth. Reported on 06/18/2015    . lamoTRIgine (LAMICTAL) 200 MG tablet Take 200 mg by mouth at bedtime.    . Multiple Vitamin (MULTIVITAMIN WITH MINERALS) TABS tablet Take 1 tablet by mouth daily. Reported on 06/18/2015    . nicotine (NICODERM CQ - DOSED IN MG/24 HOURS) 21 mg/24hr patch Place 1 patch (21 mg total) onto the skin daily. 28 patch 0  . ondansetron (ZOFRAN-ODT) 8 MG disintegrating tablet Take 8 mg by mouth every 8 (eight) hours as needed for nausea or vomiting. Reported on 07/15/2015    . potassium chloride SA (KLOR-CON M20) 20 MEQ tablet Take 1 tablet (20 mEq total) by mouth daily. 30 tablet 2  . SYNTHROID  200 MCG tablet Take 1 tablet (200 mcg total) by mouth daily before breakfast. 30 tablet 1  . SYNTHROID 25 MCG tablet TAKE 1 TABLET BY MOUTH EVERY DAY BEFORE BREAKFAST 30 tablet 1  . Vilazodone HCl (VIIBRYD) 40 MG TABS Take by mouth daily.     No current facility-administered medications on file prior to visit.    ROS as in subjective    Objective: BP 110/88   Pulse 62   Ht 5' 7.25" (1.708 m)   Wt 282 lb 8 oz (128.1 kg)   SpO2 99%   BMI 43.92 kg/m   Wt Readings from Last 3 Encounters:  11/05/15 282 lb 8 oz (128.1 kg)  09/22/15 283 lb (128.4 kg)  07/15/15 279 lb (126.6 kg)   Gen: wd, wn, nad Psych: pleasant, answers questions appropriately several erythematous 1-3 mm diameter papular lesion of bilat buttocks suggestive of recent insect or mosquito bites no specific indurated, fluctuant or warm lesions Anoscopy performed with chaperone Lyndon Code, CMA.  Discuses risks/benefits of procedure.   Examined anus and rectum with anoscopy, there is inflamed internal hemorrhoids, no obvious blood, there is small external hemorrhoid, but no obvious fissure or other abnormality. HENT unremarkable Lungs clear    Assessment: Encounter Diagnoses  Name Primary?  . Hair loss Yes  . Hypothyroidism, unspecified type   . Essential hypertension   . Galactorrhea   . Loose stools   . Blood in stool   . Acute ethmoidal sinusitis, recurrence not specified   . Rectal pain   . Mosquito bite, initial encounter   . Internal hemorrhoid   . Fibromyalgia   . Chronic pain syndrome   . Polyarthralgia   . Cervical disc disorder with radiculopathy of cervical region     Plan: Hair loss - recheck labs, consider changing back to generic synthroid/levothyroxine since she doesn't recall having this problem on the generic.  She has anxiety and other potential causes of hair loss  hypothyroidism - labs today  HTN - c/t same medication  Reported chronic galactorrhea with reported prior eval,  benign causes.   Recheck pituitary labs in light of hair loss  Loose stools - stool studies ordered, gave patient kit for stool collection  Blood in stool - likely due to internal hemorrhoids and recent diarrhea causing some inflammation at rectum.   Begin hydrocortisone suppositories  Rectal pain - anoscopy reveals inflamed internal hemorrhoids, no obvious fissure, not particularly painful on exam with anoscope, advised SITZ baths, hydrocortisone cream topically short  term. If not resolving, refer to GI.  Begin doxycycline for both sinusitis and non healing mosquito bites on buttocks  Mosquito bites - can use hydrocortisone 2.5% cream topically, c/t good wound care  Chronic pain, fibromyalgia - advised that I will keep the soma where it is.  She wants to go back to QID, and I advised that I would c/t TID for now, but really should consider weaning down to BID  Cervical disc disease - she reports ortho consult advised she go ahead and see Dr. Lynann Bologna.  Will await records  Noma was seen today for follow-up.  Diagnoses and all orders for this visit:  Hair loss -     TSH -     T4, free -     T3 -     Prolactin -     FSH/LH -     CBC  Hypothyroidism, unspecified type -     TSH -     T4, free -     T3 -     Prolactin -     FSH/LH -     CBC  Essential hypertension  Galactorrhea -     TSH -     T4, free -     T3 -     Prolactin -     FSH/LH  Loose stools -     CBC -     Stool culture; Future -     Ova and parasite examination; Future -     Clostridium difficile EIA; Future  Blood in stool -     CBC -     Stool culture; Future -     Ova and parasite examination; Future -     Clostridium difficile EIA; Future  Acute ethmoidal sinusitis, recurrence not specified  Rectal pain  Mosquito bite, initial encounter  Internal hemorrhoid  Fibromyalgia  Chronic pain syndrome  Polyarthralgia  Cervical disc disorder with radiculopathy of cervical region  Other  orders -     Flu Vaccine QUAD 36+ mos IM -     doxycycline (VIBRA-TABS) 100 MG tablet; Take 1 tablet (100 mg total) by mouth 2 (two) times daily. -     hydrocortisone (ANUSOL-HC) 25 MG suppository; Place 1 suppository (25 mg total) rectally 2 (two) times daily. -     hydrocortisone (ANUSOL-HC) 2.5 % rectal cream; Place 1 application rectally 2 (two) times daily.  Spent > 45 minutes face to face with patient in discussion of symptoms, evaluation, plan and recommendations.

## 2015-11-05 NOTE — Telephone Encounter (Signed)
Call pharmacy and get her medications that are less expensive.

## 2015-11-06 LAB — T3: T3 TOTAL: 73 ng/dL — AB (ref 76–181)

## 2015-11-06 LAB — FSH/LH
FSH: 8.4 m[IU]/mL
LH: 3.6 m[IU]/mL

## 2015-11-06 LAB — PROLACTIN: Prolactin: 6.6 ng/mL

## 2015-11-06 NOTE — Telephone Encounter (Signed)
Pt informed and verbalized understanding

## 2015-11-06 NOTE — Telephone Encounter (Signed)
Left message to call back  

## 2015-11-09 ENCOUNTER — Telehealth: Payer: Self-pay

## 2015-11-09 NOTE — Telephone Encounter (Signed)
Called [facility]. Requested Dr. Juanda ChanceWang's notes to be faxed to this office. Patient is scheduled to see Dr. Yevette Edwardsumonski on 11/16/15; arrival time: 1:45 for a 2:15 appt.  Called patient to give referral appt info.  No answer. Will try later.

## 2015-11-09 NOTE — Telephone Encounter (Signed)
-----   Message from Jac Canavanavid S Tysinger, PA-C sent at 11/09/2015  5:57 AM EDT ----- Rip Harbourk, see my message below again, but Dr. Regino SchultzeWang and Dr. Yevette Edwardsumonski who is an orthopedic surgeon in the same building work together.   Unfortunately Dr. Regino SchultzeWang hasn't sent me an office note to know what his recommendations were.   So go ahead and refer to Dr. Yevette Edwardsumonski, but at least get me the office notes where she saw Dr. Regino SchultzeWang.

## 2015-11-10 NOTE — Telephone Encounter (Signed)
Called patient. No answer. Left vmail w/ appt info.

## 2015-11-11 ENCOUNTER — Other Ambulatory Visit: Payer: Self-pay | Admitting: Medical

## 2015-11-11 NOTE — Telephone Encounter (Signed)
Patient called back and confirmed message received and confirmed referral appt.

## 2015-11-18 ENCOUNTER — Other Ambulatory Visit: Payer: Self-pay | Admitting: Medical

## 2015-11-18 NOTE — Telephone Encounter (Signed)
Is this okay to refill? 

## 2015-11-19 ENCOUNTER — Other Ambulatory Visit: Payer: Self-pay | Admitting: Medical

## 2015-11-19 ENCOUNTER — Telehealth: Payer: Self-pay

## 2015-11-19 NOTE — Telephone Encounter (Signed)
Pt called and stated that she had missed a call from us. Pt was asking if it was concerning her refill. Pt advise pt on status of refill.

## 2015-11-19 NOTE — Telephone Encounter (Signed)
Pt called back stating that she intially did not read the instructions on her Soma medication and she remembered discussing with Vincenza HewsShane about taking this 3 or 3 times a day so pt started taking the Soma 3 times a day but then read the instructions and saw that she should have been taking it 2 times so started taking it that way. Pt will be out of medication on Saturday

## 2015-11-19 NOTE — Telephone Encounter (Signed)
Call it out 

## 2015-11-19 NOTE — Telephone Encounter (Signed)
Called in soma. Renee [name]/RLB

## 2015-11-23 ENCOUNTER — Ambulatory Visit: Payer: BLUE CROSS/BLUE SHIELD | Admitting: Medical

## 2015-12-09 ENCOUNTER — Ambulatory Visit: Payer: BLUE CROSS/BLUE SHIELD | Admitting: Medical

## 2015-12-13 ENCOUNTER — Other Ambulatory Visit: Payer: Self-pay | Admitting: Medical

## 2015-12-14 ENCOUNTER — Other Ambulatory Visit: Payer: Self-pay | Admitting: Medical

## 2015-12-14 ENCOUNTER — Telehealth: Payer: Self-pay | Admitting: Medical

## 2015-12-14 DIAGNOSIS — F900 Attention-deficit hyperactivity disorder, predominantly inattentive type: Secondary | ICD-10-CM | POA: Diagnosis not present

## 2015-12-14 DIAGNOSIS — F410 Panic disorder [episodic paroxysmal anxiety] without agoraphobia: Secondary | ICD-10-CM | POA: Diagnosis not present

## 2015-12-14 DIAGNOSIS — F3131 Bipolar disorder, current episode depressed, mild: Secondary | ICD-10-CM | POA: Diagnosis not present

## 2015-12-14 DIAGNOSIS — F3174 Bipolar disorder, in full remission, most recent episode manic: Secondary | ICD-10-CM | POA: Diagnosis not present

## 2015-12-14 NOTE — Telephone Encounter (Signed)
Call it out but next visit in a month, we need to cut down some on the dosing.  Make sure she has f/u.

## 2015-12-14 NOTE — Telephone Encounter (Signed)
Pt called and is requesting a refill on her SOMA it will run out on sat  But she is going out of town Friday morning and would like to pick it up on Friday if you would give the pharmacy the approval for the early refill,pt uses [id number] - [place], [place] - [address] [facility] [address] and pt can be reached at [phone]

## 2015-12-15 ENCOUNTER — Other Ambulatory Visit: Payer: Self-pay

## 2015-12-15 NOTE — Telephone Encounter (Signed)
error 

## 2015-12-15 NOTE — Telephone Encounter (Signed)
Called patient  To let her know that we will called in her soma , and to make an appt. She has made an  appt for11/29/17.

## 2015-12-23 ENCOUNTER — Ambulatory Visit: Payer: BLUE CROSS/BLUE SHIELD | Admitting: Medical

## 2015-12-30 ENCOUNTER — Ambulatory Visit: Payer: BLUE CROSS/BLUE SHIELD | Admitting: Medical

## 2016-01-06 ENCOUNTER — Ambulatory Visit: Payer: BLUE CROSS/BLUE SHIELD | Admitting: Medical

## 2016-01-11 ENCOUNTER — Ambulatory Visit: Payer: BLUE CROSS/BLUE SHIELD | Admitting: Medical

## 2016-01-12 ENCOUNTER — Encounter: Payer: Self-pay | Admitting: Medical

## 2016-01-12 ENCOUNTER — Telehealth: Payer: Self-pay | Admitting: Medical

## 2016-01-12 ENCOUNTER — Ambulatory Visit (INDEPENDENT_AMBULATORY_CARE_PROVIDER_SITE_OTHER): Payer: BLUE CROSS/BLUE SHIELD | Admitting: Medical

## 2016-01-12 VITALS — BP 122/80 | Temp 98.6°F | Resp 16 | Wt 292.3 lb

## 2016-01-12 DIAGNOSIS — E039 Hypothyroidism, unspecified: Secondary | ICD-10-CM | POA: Diagnosis not present

## 2016-01-12 DIAGNOSIS — N643 Galactorrhea not associated with childbirth: Secondary | ICD-10-CM

## 2016-01-12 DIAGNOSIS — Z9189 Other specified personal risk factors, not elsewhere classified: Secondary | ICD-10-CM

## 2016-01-12 DIAGNOSIS — M797 Fibromyalgia: Secondary | ICD-10-CM

## 2016-01-12 DIAGNOSIS — M2550 Pain in unspecified joint: Secondary | ICD-10-CM

## 2016-01-12 DIAGNOSIS — M1A9XX1 Chronic gout, unspecified, with tophus (tophi): Secondary | ICD-10-CM

## 2016-01-12 DIAGNOSIS — Z13820 Encounter for screening for osteoporosis: Secondary | ICD-10-CM

## 2016-01-12 DIAGNOSIS — F3175 Bipolar disorder, in partial remission, most recent episode depressed: Secondary | ICD-10-CM

## 2016-01-12 DIAGNOSIS — I10 Essential (primary) hypertension: Secondary | ICD-10-CM

## 2016-01-12 DIAGNOSIS — O926 Galactorrhea: Secondary | ICD-10-CM | POA: Diagnosis not present

## 2016-01-12 DIAGNOSIS — H9190 Unspecified hearing loss, unspecified ear: Secondary | ICD-10-CM

## 2016-01-12 DIAGNOSIS — Z1231 Encounter for screening mammogram for malignant neoplasm of breast: Secondary | ICD-10-CM

## 2016-01-12 DIAGNOSIS — G894 Chronic pain syndrome: Secondary | ICD-10-CM | POA: Diagnosis not present

## 2016-01-12 DIAGNOSIS — F411 Generalized anxiety disorder: Secondary | ICD-10-CM

## 2016-01-12 DIAGNOSIS — E559 Vitamin D deficiency, unspecified: Secondary | ICD-10-CM

## 2016-01-12 DIAGNOSIS — M5030 Other cervical disc degeneration, unspecified cervical region: Secondary | ICD-10-CM | POA: Diagnosis not present

## 2016-01-12 DIAGNOSIS — Z1239 Encounter for other screening for malignant neoplasm of breast: Secondary | ICD-10-CM

## 2016-01-12 DIAGNOSIS — Z803 Family history of malignant neoplasm of breast: Secondary | ICD-10-CM | POA: Diagnosis not present

## 2016-01-12 DIAGNOSIS — M5010 Cervical disc disorder with radiculopathy, unspecified cervical region: Secondary | ICD-10-CM

## 2016-01-12 DIAGNOSIS — L659 Nonscarring hair loss, unspecified: Secondary | ICD-10-CM

## 2016-01-12 DIAGNOSIS — M722 Plantar fascial fibromatosis: Secondary | ICD-10-CM

## 2016-01-12 MED ORDER — GABAPENTIN 600 MG PO TABS: 600.0000 mg | ORAL_TABLET | Freq: Four times a day (QID) | ORAL | 5 refills | Status: DC

## 2016-01-12 MED ORDER — FEBUXOSTAT 80 MG PO TABS: 1.0000 | ORAL_TABLET | Freq: Every day | ORAL | 1 refills | Status: DC

## 2016-01-12 MED ORDER — CELECOXIB 200 MG PO CAPS: 200.0000 mg | ORAL_CAPSULE | Freq: Two times a day (BID) | ORAL | 0 refills | Status: DC

## 2016-01-12 MED ORDER — FUROSEMIDE 40 MG PO TABS: ORAL_TABLET | ORAL | 1 refills | Status: DC

## 2016-01-12 MED ORDER — CARISOPRODOL 350 MG PO TABS: 350.0000 mg | ORAL_TABLET | Freq: Three times a day (TID) | ORAL | 2 refills | Status: DC

## 2016-01-12 MED ORDER — SYNTHROID 25 MCG PO TABS: ORAL_TABLET | ORAL | 1 refills | Status: DC

## 2016-01-12 MED ORDER — POTASSIUM CHLORIDE CRYS ER 20 MEQ PO TBCR: 20.0000 meq | EXTENDED_RELEASE_TABLET | Freq: Every day | ORAL | 1 refills | Status: DC

## 2016-01-12 MED ORDER — LEVOTHYROXINE SODIUM 200 MCG PO TABS: 200.0000 ug | ORAL_TABLET | Freq: Every day | ORAL | 1 refills | Status: DC

## 2016-01-12 NOTE — Telephone Encounter (Signed)
Refer to ENT for hearing loss concerns

## 2016-01-12 NOTE — Progress Notes (Signed)
Subjective: Chief Complaint  Patient presents with  . follow up     follow up weight gain , depression. Request refill of Soma, Celebrex, uloric, gabapentin.    Recently put on Latuda by psychiatry.  Been on 40mg  daily for 6 weeks.    Last visit we changed to name brand Synthroid.  Had hair loss, so went back to the generic Synthroid.  She notes that when she was age 40yo, had similar hair loss with name brand Synthroid.  Having some edema, retaining fluid.  Been more sleepy with latuda, so has not been using diuretic to avoid incontinent.   Sees Dr. [name], next visit 01/22/16.  Has seen Dr. [name] 11 years.  Blood in stool, rectal pain -  resolved since last visit.  Chronic pain, fibromyalgia - c/t on current medications without c/o  Galactorrhea - ongoing for months without blood.  No recent mammogram  Is OCD about hand washing, wears gloves when wiping after toileting as she doesn't have good feeling in her fingers.  Can't reach to wipe from back, wipes from front to back.  My chart record says she needs Td.  She thinks she had Td done in last 5 years.  She will get [place] copy of vaccine record  Mother is having surgery on rotator cuff soon.  She worries she will develop all of the health problems her mother has.  Did start smoking again recently.  Under more stress of late.  Past Medical History:  Diagnosis Date  . ADD (attention deficit disorder with hyperactivity)   . Allergy   . Anemia   . Anxiety and depression    followed by Dr. [name] and Renee [name] at [facility]  . Bipolar disorder [name])    sees Dr. Milagros [name]  . Chronic nausea   . Chronic pain   . DDD (degenerative disc disease), cervical 02/24/2015   C4 foraminal narrowing-chronic pain   . Dermatitis   . Edema   . Fibromyalgia   . GERD (gastroesophageal reflux disease)   . Hearing difficulty   . Herpes simplex   . Hypertension   . Hypothyroidism   . Migraines   . Polyarthralgia   . Skin abnormalities    sees [name]  . Urinary incontinence    Current Outpatient Prescriptions on File Prior to Visit  Medication Sig Dispense Refill  . acetaminophen (TYLENOL) 500 MG tablet Take 1,000 mg by mouth every 6 (six) hours as needed (Pain). Reported on 07/15/2015    . ALPRAZolam (XANAX) 1 MG tablet Take 1 mg by mouth 2 (two) times daily. .5mg  at 5pm and 1 tab at hs    . amphetamine-dextroamphetamine (ADDERALL) 30 MG tablet Take 1 tablet by mouth 2 (two) times daily as needed (for add). 60 tablet 0  . Calcium Citrate-Vitamin D (CALCIUM CITRATE + D3 PO) Take 2 tablets by mouth 3 (three) times daily. Reported on 07/15/2015    . colchicine 0.6 MG tablet Take 0.6 mg by mouth 2 (two) times daily.    . diphenhydrAMINE (BENADRYL) 25 mg capsule Take 25 mg by mouth 2 (two) times daily as needed for itching or allergies. Reported on 07/15/2015    . hydrocortisone (ANUSOL-HC) 2.5 % rectal cream Place 1 application rectally 2 (two) times daily. 30 g 0  . IRON PO Take by mouth. Reported on 06/18/2015    . lamoTRIgine (LAMICTAL) 200 MG tablet Take 200 mg by mouth at bedtime.    . Multiple Vitamin (MULTIVITAMIN WITH MINERALS) TABS  tablet Take 1 tablet by mouth daily. Reported on 06/18/2015    . Vilazodone HCl (VIIBRYD) 40 MG TABS Take 20 mg by mouth daily.     . cetirizine HCl (ZYRTEC) 5 MG/5ML SYRP Take by mouth.    . Cholecalciferol (VITAMIN D3) 5000 units CAPS Reported on 06/18/2015  0  . doxycycline (VIBRA-TABS) 100 MG tablet Take 1 tablet (100 mg total) by mouth 2 (two) times daily. (Patient not taking: Reported on 01/12/2016) 20 tablet 0  . hydrocortisone (ANUSOL-HC) 25 MG suppository Place 1 suppository (25 mg total) rectally 2 (two) times daily. (Patient not taking: Reported on 01/12/2016) 12 suppository 0  . nicotine (NICODERM CQ - DOSED IN MG/24 HOURS) 21 mg/24hr patch Place 1 patch (21 mg total) onto the skin daily. (Patient not taking: Reported on 01/12/2016) 28 patch 0  . ondansetron (ZOFRAN-ODT) 8 MG  disintegrating tablet Take 8 mg by mouth every 8 (eight) hours as needed for nausea or vomiting. Reported on 07/15/2015     No current facility-administered medications on file prior to visit.    ROS as in subjective   Objective: BP 122/80   Temp 98.6 F (37 C) (Oral)   Resp 16   Wt 292 lb 4.8 oz (132.6 kg)   SpO2 97%   BMI 45.44 kg/m   Wt Readings from Last 3 Encounters:  01/12/16 292 lb 4.8 oz (132.6 kg)  11/05/15 282 lb 8 oz (128.1 kg)  09/22/15 283 lb (128.4 kg)   Gen: wd,wn, nad    Assessment: Encounter Diagnoses  Name Primary?  . Galactorrhea Yes  . Hypothyroidism, unspecified type   . Essential hypertension   . Family history of breast cancer   . Plantar fasciitis   . Screening for breast cancer   . Vitamin D deficiency   . At high risk for osteoporosis   . Screening for osteoporosis   . Chronic pain syndrome   . Bipolar disorder, in partial remission, most recent episode depressed (HCC)   . DDD (degenerative disc disease), cervical   . Cervical disc disorder with radiculopathy of cervical region   . Polyarthralgia   . Hair loss   . Chronic gout with tophus, unspecified cause, unspecified site   . Generalized anxiety disorder   . Fibromyalgia   . Hearing loss, unspecified hearing loss type, unspecified laterality      Plan: Relatively stable on current medications discussed her ongoing concerns, medications. Referral to ENT for hearing concerns referral for mammogram and bone density After first of the year, will consider gyn referral at her request C/t therapy with psychiatry  Renee [name] was seen today for follow up.  Diagnoses and all orders for this visit:  Galactorrhea -     Cancel: MM Digital Diagnostic Bilat; Future -     MM DIAG BREAST TOMO BILATERAL; Future -     US BREAST LTD UNI LEFT INC AXILLA; Future -     US BREAST LTD UNI RIGHT INC AXILLA; Future  Hypothyroidism, unspecified type -     DG Bone Density; Future  Essential  hypertension  Family history of breast cancer -     Cancel: MM Digital Diagnostic Bilat; Future -     MM DIAG BREAST TOMO BILATERAL; Future -     US BREAST LTD UNI LEFT INC AXILLA; Future -     US BREAST LTD UNI RIGHT INC AXILLA; Future  Plantar fasciitis  Screening for breast cancer  Vitamin D deficiency -     DG  Bone Density; Future  At high risk for osteoporosis -     DG Bone Density; Future  Screening for osteoporosis -     DG Bone Density; Future  Chronic pain syndrome  Bipolar disorder, in partial remission, most recent episode depressed (HCC)  DDD (degenerative disc disease), cervical  Cervical disc disorder with radiculopathy of cervical region  Polyarthralgia  Hair loss  Chronic gout with tophus, unspecified cause, unspecified site  Generalized anxiety disorder  Fibromyalgia  Hearing loss, unspecified hearing loss type, unspecified laterality  Other orders -     SYNTHROID 25 MCG tablet; TAKE 1 TABLET BY MOUTH EVERY DAY BEFORE BREAKFAST -     levothyroxine (SYNTHROID) 200 MCG tablet; Take 1 tablet (200 mcg total) by mouth daily before breakfast. -     potassium chloride SA (KLOR-CON M20) 20 MEQ tablet; Take 1 tablet (20 mEq total) by mouth daily. -     gabapentin (NEURONTIN) 600 MG tablet; Take 1 tablet (600 mg total) by mouth 4 (four) times daily. -     furosemide (LASIX) 40 MG tablet; TAKE 1 TABLET(40 MG) BY MOUTH DAILY -     Febuxostat (ULORIC) 80 MG TABS; Take 1 tablet (80 mg total) by mouth daily. -     celecoxib (CELEBREX) 200 MG capsule; Take 1 capsule (200 mg total) by mouth 2 (two) times daily. -     carisoprodol (SOMA) 350 MG tablet; Take 1 tablet (350 mg total) by mouth 3 (three) times daily.

## 2016-01-13 NOTE — Telephone Encounter (Signed)
Faxed referral

## 2016-01-19 ENCOUNTER — Other Ambulatory Visit: Payer: Self-pay

## 2016-01-19 DIAGNOSIS — H9190 Unspecified hearing loss, unspecified ear: Secondary | ICD-10-CM

## 2016-01-22 ENCOUNTER — Other Ambulatory Visit: Payer: Self-pay

## 2016-01-22 DIAGNOSIS — F3131 Bipolar disorder, current episode depressed, mild: Secondary | ICD-10-CM | POA: Diagnosis not present

## 2016-01-22 DIAGNOSIS — F900 Attention-deficit hyperactivity disorder, predominantly inattentive type: Secondary | ICD-10-CM | POA: Diagnosis not present

## 2016-01-22 DIAGNOSIS — F3111 Bipolar disorder, current episode manic without psychotic features, mild: Secondary | ICD-10-CM | POA: Diagnosis not present

## 2016-01-26 DIAGNOSIS — M5412 Radiculopathy, cervical region: Secondary | ICD-10-CM | POA: Diagnosis not present

## 2016-01-26 DIAGNOSIS — M542 Cervicalgia: Secondary | ICD-10-CM | POA: Diagnosis not present

## 2016-01-27 ENCOUNTER — Other Ambulatory Visit: Payer: Self-pay | Admitting: Orthopedic Surgery

## 2016-01-27 DIAGNOSIS — M5412 Radiculopathy, cervical region: Secondary | ICD-10-CM

## 2016-01-27 DIAGNOSIS — M542 Cervicalgia: Secondary | ICD-10-CM

## 2016-01-28 ENCOUNTER — Other Ambulatory Visit: Payer: Self-pay | Admitting: Medical

## 2016-01-28 NOTE — Telephone Encounter (Signed)
Can she have a refill on this 

## 2016-02-03 ENCOUNTER — Other Ambulatory Visit: Payer: Self-pay

## 2016-02-08 ENCOUNTER — Other Ambulatory Visit: Payer: Self-pay

## 2016-02-12 ENCOUNTER — Other Ambulatory Visit: Payer: Self-pay

## 2016-02-17 ENCOUNTER — Ambulatory Visit: Payer: BLUE CROSS/BLUE SHIELD | Admitting: Medical

## 2016-02-17 ENCOUNTER — Telehealth: Payer: Self-pay | Admitting: Internal Medicine

## 2016-02-17 ENCOUNTER — Other Ambulatory Visit: Payer: Self-pay

## 2016-02-17 NOTE — Telephone Encounter (Signed)

## 2016-02-17 NOTE — Telephone Encounter (Signed)
How many no shows is this Olegario MessierKathy?

## 2016-02-22 ENCOUNTER — Telehealth: Payer: Self-pay | Admitting: Family Medicine

## 2016-02-22 NOTE — Telephone Encounter (Signed)
See inbasket note

## 2016-02-22 NOTE — Telephone Encounter (Signed)
Lets send a letter to remind about cancellation policy and no shows.

## 2016-02-22 NOTE — Telephone Encounter (Signed)
Pt called and apologized for missing her appt.  She said there was a death in her family.  And her mom had surgery and lots of appts.

## 2016-02-22 NOTE — Telephone Encounter (Signed)
How many "no shows" has she had?

## 2016-02-23 ENCOUNTER — Other Ambulatory Visit: Payer: Self-pay

## 2016-02-23 ENCOUNTER — Telehealth: Payer: Self-pay | Admitting: Medical

## 2016-02-23 NOTE — Telephone Encounter (Signed)
Error

## 2016-02-23 NOTE — Telephone Encounter (Signed)
Done

## 2016-02-26 ENCOUNTER — Encounter: Payer: Self-pay | Admitting: Medical

## 2016-03-03 ENCOUNTER — Other Ambulatory Visit: Payer: Self-pay

## 2016-03-07 ENCOUNTER — Ambulatory Visit: Payer: BLUE CROSS/BLUE SHIELD | Admitting: Medical

## 2016-03-10 ENCOUNTER — Telehealth: Payer: Self-pay | Admitting: Medical

## 2016-03-10 ENCOUNTER — Encounter: Payer: Self-pay | Admitting: Family Medicine

## 2016-03-10 ENCOUNTER — Other Ambulatory Visit: Payer: Self-pay | Admitting: Medical

## 2016-03-10 ENCOUNTER — Ambulatory Visit: Payer: BLUE CROSS/BLUE SHIELD | Admitting: Medical

## 2016-03-10 MED ORDER — CARISOPRODOL 350 MG PO TABS: 350.0000 mg | ORAL_TABLET | Freq: Three times a day (TID) | ORAL | 0 refills | Status: DC

## 2016-03-10 MED ORDER — SYNTHROID 25 MCG PO TABS: ORAL_TABLET | ORAL | 0 refills | Status: DC

## 2016-03-10 NOTE — Telephone Encounter (Signed)
Call out 30 day supply of Soma, no refill.   FYI - she is being sent no show bill today for the 3rd cancel/no show within 24 hours since she is not going to be able to make today's appt.     [facility]

## 2016-03-10 NOTE — Telephone Encounter (Signed)
[facility] 

## 2016-03-10 NOTE — Telephone Encounter (Signed)
Pt called stating that she did not have a ride to come to her appointment today and she wanted to know if Vincenza HewsShane would be able to refill Soma and Synthroid without her coming in for her appointment. Spoke to [place] and he will send a 30 day refill & pt will get No Show info. Advised pt

## 2016-03-14 ENCOUNTER — Ambulatory Visit
Admission: RE | Admit: 2016-03-14 | Discharge: 2016-03-14 | Disposition: A | Discharge status: Home / Self Care | Payer: BLUE CROSS/BLUE SHIELD | Source: Ambulatory Visit | Attending: Orthopedic Surgery | Admitting: Orthopedic Surgery

## 2016-03-14 ENCOUNTER — Other Ambulatory Visit: Payer: Self-pay | Admitting: Medical

## 2016-03-14 ENCOUNTER — Telehealth: Payer: Self-pay | Admitting: Medical

## 2016-03-14 DIAGNOSIS — M5412 Radiculopathy, cervical region: Secondary | ICD-10-CM

## 2016-03-14 DIAGNOSIS — M50221 Other cervical disc displacement at C4-C5 level: Secondary | ICD-10-CM | POA: Diagnosis not present

## 2016-03-14 DIAGNOSIS — M542 Cervicalgia: Secondary | ICD-10-CM

## 2016-03-14 DIAGNOSIS — M50222 Other cervical disc displacement at C5-C6 level: Secondary | ICD-10-CM | POA: Diagnosis not present

## 2016-03-14 MED ORDER — SYNTHROID 25 MCG PO TABS: ORAL_TABLET | ORAL | 0 refills | Status: DC

## 2016-03-14 NOTE — Telephone Encounter (Signed)
I resent synthroid.   Our office policy is not to refill controlled substances/medications like Soma if lost.   These medications are high risk, and its unfortunate, but I can't refill these at this time.

## 2016-03-14 NOTE — Telephone Encounter (Signed)
Pt came in crying.  Per Vincenza HewsShane and Dr. Susann GivensLalonde [facility]

## 2016-03-14 NOTE — Telephone Encounter (Signed)
Pt called and stated that she picked up meds this weekend but is unable to locate. She thinks they may have been thrown away. Pt is requesting refills on Soma and Synthroid .25. Pt is aware she will have to pay out of pocket. Pt states she is to have a MRI and really needs to SOMA prior to appt. Pt [facility] [address] and can be reached at [phone].

## 2016-03-17 ENCOUNTER — Other Ambulatory Visit: Payer: Self-pay

## 2016-03-24 ENCOUNTER — Other Ambulatory Visit: Payer: Self-pay

## 2016-03-25 ENCOUNTER — Telehealth: Payer: Self-pay

## 2016-03-25 ENCOUNTER — Other Ambulatory Visit: Payer: Self-pay | Admitting: Medical

## 2016-03-25 MED ORDER — CELECOXIB 200 MG PO CAPS: 200.0000 mg | ORAL_CAPSULE | Freq: Two times a day (BID) | ORAL | 0 refills | Status: DC

## 2016-03-25 NOTE — Telephone Encounter (Signed)
Pt needs refill of Celebrex to [facility]. She has called her pharmacy and they told her to call here for a refill. Renee [name]/RLB

## 2016-04-10 ENCOUNTER — Other Ambulatory Visit: Payer: Self-pay | Admitting: Medical

## 2016-04-11 NOTE — Telephone Encounter (Signed)
Is she still a patient

## 2016-04-15 DIAGNOSIS — M542 Cervicalgia: Secondary | ICD-10-CM | POA: Diagnosis not present

## 2016-04-15 DIAGNOSIS — M545 Low back pain: Secondary | ICD-10-CM | POA: Diagnosis not present

## 2016-04-26 ENCOUNTER — Other Ambulatory Visit: Payer: Self-pay | Admitting: Medical

## 2016-05-02 DIAGNOSIS — F3132 Bipolar disorder, current episode depressed, moderate: Secondary | ICD-10-CM | POA: Diagnosis not present

## 2016-05-04 ENCOUNTER — Other Ambulatory Visit: Payer: Self-pay | Admitting: Medical

## 2016-05-05 ENCOUNTER — Ambulatory Visit: Payer: BLUE CROSS/BLUE SHIELD | Admitting: Neurology

## 2016-05-05 ENCOUNTER — Encounter: Payer: Self-pay | Admitting: Neurology

## 2016-05-05 DIAGNOSIS — Z0289 Encounter for other administrative examinations: Secondary | ICD-10-CM

## 2016-05-06 ENCOUNTER — Ambulatory Visit (INDEPENDENT_AMBULATORY_CARE_PROVIDER_SITE_OTHER): Payer: BLUE CROSS/BLUE SHIELD | Admitting: Medical

## 2016-05-06 ENCOUNTER — Encounter: Payer: Self-pay | Admitting: Medical

## 2016-05-06 VITALS — BP 110/84 | HR 93 | Wt 301.8 lb

## 2016-05-06 DIAGNOSIS — F4310 Post-traumatic stress disorder, unspecified: Secondary | ICD-10-CM

## 2016-05-06 DIAGNOSIS — G5603 Carpal tunnel syndrome, bilateral upper limbs: Secondary | ICD-10-CM

## 2016-05-06 DIAGNOSIS — F509 Eating disorder, unspecified: Secondary | ICD-10-CM

## 2016-05-06 DIAGNOSIS — Z1239 Encounter for other screening for malignant neoplasm of breast: Secondary | ICD-10-CM

## 2016-05-06 DIAGNOSIS — E039 Hypothyroidism, unspecified: Secondary | ICD-10-CM | POA: Diagnosis not present

## 2016-05-06 DIAGNOSIS — M5030 Other cervical disc degeneration, unspecified cervical region: Secondary | ICD-10-CM

## 2016-05-06 DIAGNOSIS — O926 Galactorrhea: Secondary | ICD-10-CM

## 2016-05-06 DIAGNOSIS — M5010 Cervical disc disorder with radiculopathy, unspecified cervical region: Secondary | ICD-10-CM

## 2016-05-06 DIAGNOSIS — R609 Edema, unspecified: Secondary | ICD-10-CM

## 2016-05-06 DIAGNOSIS — I10 Essential (primary) hypertension: Secondary | ICD-10-CM

## 2016-05-06 DIAGNOSIS — F1021 Alcohol dependence, in remission: Secondary | ICD-10-CM

## 2016-05-06 DIAGNOSIS — F419 Anxiety disorder, unspecified: Secondary | ICD-10-CM

## 2016-05-06 DIAGNOSIS — F3181 Bipolar II disorder: Secondary | ICD-10-CM

## 2016-05-06 DIAGNOSIS — N643 Galactorrhea not associated with childbirth: Secondary | ICD-10-CM

## 2016-05-06 DIAGNOSIS — Z803 Family history of malignant neoplasm of breast: Secondary | ICD-10-CM

## 2016-05-06 DIAGNOSIS — G894 Chronic pain syndrome: Secondary | ICD-10-CM

## 2016-05-06 DIAGNOSIS — E785 Hyperlipidemia, unspecified: Secondary | ICD-10-CM

## 2016-05-06 DIAGNOSIS — M1A9XX1 Chronic gout, unspecified, with tophus (tophi): Secondary | ICD-10-CM

## 2016-05-06 DIAGNOSIS — Z79899 Other long term (current) drug therapy: Secondary | ICD-10-CM

## 2016-05-06 DIAGNOSIS — M2550 Pain in unspecified joint: Secondary | ICD-10-CM

## 2016-05-06 DIAGNOSIS — Z1231 Encounter for screening mammogram for malignant neoplasm of breast: Secondary | ICD-10-CM

## 2016-05-06 DIAGNOSIS — F4321 Adjustment disorder with depressed mood: Secondary | ICD-10-CM

## 2016-05-06 LAB — COMPREHENSIVE METABOLIC PANEL
ALK PHOS: 69 U/L (ref 33–115)
ALT: 43 U/L — ABNORMAL HIGH (ref 6–29)
AST: 36 U/L — ABNORMAL HIGH (ref 10–30)
Albumin: 3.8 g/dL (ref 3.6–5.1)
BUN: 20 mg/dL (ref 7–25)
CALCIUM: 8.9 mg/dL (ref 8.6–10.2)
CHLORIDE: 108 mmol/L (ref 98–110)
CO2: 24 mmol/L (ref 20–31)
Creat: 0.78 mg/dL (ref 0.50–1.10)
Glucose, Bld: 102 mg/dL — ABNORMAL HIGH (ref 65–99)
POTASSIUM: 4.3 mmol/L (ref 3.5–5.3)
Sodium: 139 mmol/L (ref 135–146)
Total Bilirubin: 0.3 mg/dL (ref 0.2–1.2)
Total Protein: 6 g/dL — ABNORMAL LOW (ref 6.1–8.1)

## 2016-05-06 LAB — CBC WITH DIFFERENTIAL/PLATELET
Basophils Absolute: 0 cells/uL (ref 0–200)
Basophils Relative: 0 %
EOS PCT: 2 %
Eosinophils Absolute: 136 cells/uL (ref 15–500)
HEMATOCRIT: 43.1 % (ref 35.0–45.0)
HEMOGLOBIN: 13.9 g/dL (ref 11.7–15.5)
LYMPHS ABS: 3196 {cells}/uL (ref 850–3900)
Lymphocytes Relative: 47 %
MCH: 30.3 pg (ref 27.0–33.0)
MCHC: 32.3 g/dL (ref 32.0–36.0)
MCV: 93.9 fL (ref 80.0–100.0)
MONO ABS: 476 {cells}/uL (ref 200–950)
MPV: 10.3 fL (ref 7.5–12.5)
Monocytes Relative: 7 %
NEUTROS ABS: 2992 {cells}/uL (ref 1500–7800)
NEUTROS PCT: 44 %
Platelets: 222 10*3/uL (ref 140–400)
RBC: 4.59 MIL/uL (ref 3.80–5.10)
RDW: 14.3 % (ref 11.0–15.0)
WBC: 6.8 10*3/uL (ref 4.0–10.5)

## 2016-05-06 LAB — T4, FREE: FREE T4: 0.6 ng/dL — AB (ref 0.8–1.8)

## 2016-05-06 LAB — TSH: TSH: 88.46 m[IU]/L — AB

## 2016-05-06 MED ORDER — FUROSEMIDE 40 MG PO TABS: ORAL_TABLET | ORAL | 3 refills | Status: DC

## 2016-05-06 MED ORDER — COLCHICINE 0.6 MG PO TABS: 0.6000 mg | ORAL_TABLET | Freq: Two times a day (BID) | ORAL | 0 refills | Status: DC

## 2016-05-06 MED ORDER — CARISOPRODOL 350 MG PO TABS: 350.0000 mg | ORAL_TABLET | Freq: Three times a day (TID) | ORAL | 2 refills | Status: DC

## 2016-05-06 MED ORDER — CELECOXIB 200 MG PO CAPS: 200.0000 mg | ORAL_CAPSULE | Freq: Two times a day (BID) | ORAL | 1 refills | Status: DC

## 2016-05-06 MED ORDER — FEBUXOSTAT 80 MG PO TABS: 1.0000 | ORAL_TABLET | Freq: Every day | ORAL | 3 refills | Status: DC

## 2016-05-06 MED ORDER — POTASSIUM CHLORIDE CRYS ER 20 MEQ PO TBCR: 20.0000 meq | EXTENDED_RELEASE_TABLET | Freq: Every day | ORAL | 3 refills | Status: DC

## 2016-05-06 MED ORDER — ONDANSETRON 8 MG PO TBDP: 8.0000 mg | ORAL_TABLET | Freq: Three times a day (TID) | ORAL | 2 refills | Status: DC | PRN

## 2016-05-06 MED ORDER — CETIRIZINE HCL 10 MG PO TABS: 10.0000 mg | ORAL_TABLET | Freq: Every day | ORAL | 3 refills | Status: DC

## 2016-05-06 NOTE — Progress Notes (Signed)
Subjective: Chief Complaint  Patient presents with  . med check    med check , allgergy     Medical team: Dr. [name], psychiatry Dr. Yevette Edwards, spine surgeon Sees dentist, eye doctor Sees gynecology prior consult with [facility] neurology  Here for med check.    She has long list of health issues, psychiatric issues.     She sees psychiatry, Dr. Evelene Croon x 12 years who manages the following medications  Adderall  2 daily  Latuda    Neurontin  TID for anxiety and pain  Symbyax  Lamictal and Xanax recently stopped  Stressors include helping care for her mother with breast cancer, her brother passed away a few years ago unexpectedly and she turned to alcohol and dealt with alcohol abuse for a while.  She is unemployed. She takes care of niece, up by 8am.   Dad has business, HVAC, rental property.  Mom in therapy, cancer therapy. She lives with her girlfriend.  Sober since 02/04/2014.   She has chronic pain, fibromyalgia, numerous joint pains, chronic neck pain, and prior MRI, EMG/CNS of arms through [facility] 2017.  She was offered referral to pain clinic, but given the eating disorder, moms breast cancer diagnosis, this was put on hold.   She notes hx/o stenosis of the spine, bulging discs, chronic pain.  has seen Dr. Yevette Edwards prior/surgery consult.  Taking Soma TID, celebrex BID.  Recently joined [employer], swimming 3 days per week.  In pain after swimming but ok.     Takes Colchicine prn, takes Uloric daily.   She has hx/o gastric bypass in 2008,lost 150lb but gained it back.  Hypothyroidism - taking synthroid 200 + synthroid 25 daily  Edema - taking Lasix  daily, klor con 20 daily.   Takes zyrtec daily for allergies.  Hx/o HTN.  Not check BPs.   Hx/o hyperlipidemia - no medication prior.  She notes improving diet since last lipid check.   Having some sinuses issues, needs refill on zyrtec.   Past Medical History:  Diagnosis  Date  . ADD (attention deficit disorder with hyperactivity)   . Allergy   . Anemia   . Anxiety and depression    followed by Dr. Evelene Croon and [facility]  . Bipolar disorder [facility])    sees Dr. [name]  . Chronic nausea   . Chronic pain   . DDD (degenerative disc disease), cervical 02/24/2015   C4 foraminal narrowing-chronic pain   . Dermatitis   . Edema   . Fibromyalgia   . GERD (gastroesophageal reflux disease)   . Hearing difficulty   . Herpes simplex   . Hypertension   . Hypothyroidism   . Migraines   . Polyarthralgia   . Skin abnormalities    sees [facility]  . Urinary incontinence    Current Outpatient Prescriptions on File Prior to Visit  Medication Sig Dispense Refill  . acetaminophen (TYLENOL) 500 MG tablet Take 1,000 mg by mouth every 6 (six) hours as needed (Pain). Reported on 07/15/2015    . amphetamine-dextroamphetamine (ADDERALL) 30 MG tablet Take 1 tablet by mouth 2 (two) times daily as needed (for add). 60 tablet 0  . Calcium Citrate-Vitamin D (CALCIUM CITRATE + D3 PO) Take 2 tablets by mouth 3 (three) times daily. Reported on 07/15/2015    . diphenhydrAMINE (BENADRYL) 25 mg capsule Take 25 mg by mouth 2 (two) times daily as needed for itching or allergies. Reported on 07/15/2015    .  gabapentin (NEURONTIN) 600 MG tablet Take 1 tablet (600 mg total) by mouth 4 (four) times daily. 120 tablet 5  . levothyroxine (SYNTHROID) 200 MCG tablet Take 1 tablet (200 mcg total) by mouth daily before breakfast. 90 tablet 1  . levothyroxine (SYNTHROID, LEVOTHROID) 25 MCG tablet TAKE 1 TABLET BY MOUTH EVERY DAY BEFORE BREAKFAST 30 tablet 0  . Multiple Vitamin (MULTIVITAMIN WITH MINERALS) TABS tablet Take 1 tablet by mouth daily. Reported on 06/18/2015     No current facility-administered medications on file prior to visit.    ROS as in subjective  Objective: BP 110/84   Pulse 93   Wt (!) 301 lb 12.8 oz (136.9 kg)   SpO2 96%   BMI 46.92 kg/m   Wt  Readings from Last 3 Encounters:  05/06/16 (!) 301 lb 12.8 oz (136.9 kg)  01/12/16 292 lb 4.8 oz (132.6 kg)  11/05/15 282 lb 8 oz (128.1 kg)   General appearance: alert, no distress, WD/WN,  HEENT: normocephalic, sclerae anicteric, TMs pearly, nares patent, no discharge or erythema, pharynx normal Oral cavity: MMM, no lesions Neck: supple, no lymphadenopathy, no thyromegaly, no masses Heart: RRR, normal S1, S2, no murmurs Lungs: CTA bilaterally, no wheezes, rhonchi, or rales Ext: no edema Pulses: 2+ symmetric, upper and lower extremities, normal cap refill    Assessment: Encounter Diagnoses  Name Primary?  . Essential hypertension Yes  . Hypothyroidism, unspecified type   . Cervical disc disorder with radiculopathy of cervical region   . DDD (degenerative disc disease), cervical   . Bilateral carpal tunnel syndrome   . Alcohol dependence in remission (HCC)   . Anxiety   . Bipolar 2 disorder (HCC)   . Chronic pain syndrome   . Eating disorder   . Edema, unspecified type   . Family history of breast cancer   . Chronic gout with tophus, unspecified cause, unspecified site   . Unresolved grief   . PTSD (post-traumatic stress disorder)   . Polypharmacy   . Polyarthralgia   . Hyperlipidemia, unspecified hyperlipidemia type   . Screening for breast cancer   . Galactorrhea     Plan: HTN - c/t Lasix more for edema, but BP controlled. C/t Klorcon C/t routine f/u with psychiatry regarding Bipolar, anxiety, PTSD Hypothyroidism - labs today Edema - lasix daily continued Encouraged her to go ahead and get mammogram out of the way. DDD,chronic pain, fibromyalgia, cervical disc disorder, spinal stenosis - saw Dr. Yevette Edwards recently and reportedly referred to Dr. [name] at neurology in May. Gout - labs today, c/t colchicine prn, c/t daily Uloric hyperlipidemia - return at her convinced for fasting lipid labs   Renee [name] was seen today for med check.  Diagnoses and all orders for this  visit:  Essential hypertension -     Comprehensive metabolic panel -     CBC with Differential/Platelet -     Lipid panel; Future  Hypothyroidism, unspecified type -     TSH -     T4, free  Cervical disc disorder with radiculopathy of cervical region  DDD (degenerative disc disease), cervical  Bilateral carpal tunnel syndrome  Alcohol dependence in remission (HCC)  Anxiety  Bipolar 2 disorder (HCC)  Chronic pain syndrome  Eating disorder  Edema, unspecified type  Family history of breast cancer  Chronic gout with tophus, unspecified cause, unspecified site  Unresolved grief  PTSD (post-traumatic stress disorder)  Polypharmacy  Polyarthralgia  Hyperlipidemia, unspecified hyperlipidemia type -     Lipid panel; Future  Screening  for breast cancer  Galactorrhea  Other orders -     cetirizine (ZYRTEC) 10 MG tablet; Take 1 tablet (10 mg total) by mouth at bedtime. -     ondansetron (ZOFRAN-ODT) 8 MG disintegrating tablet; Take 1 tablet (8 mg total) by mouth 3 (three) times daily as needed. -     furosemide (LASIX) 40 MG tablet; TAKE 1 TABLET(40 MG) BY MOUTH DAILY -     carisoprodol (SOMA) 350 MG tablet; Take 1 tablet (350 mg total) by mouth 3 (three) times daily. -     colchicine 0.6 MG tablet; Take 1 tablet (0.6 mg total) by mouth 2 (two) times daily. -     Febuxostat (ULORIC) 80 MG TABS; Take 1 tablet (80 mg total) by mouth daily. -     celecoxib (CELEBREX) 200 MG capsule; Take 1 capsule (200 mg total) by mouth 2 (two) times daily. -     potassium chloride SA (KLOR-CON M20) 20 MEQ tablet; Take 1 tablet (20 mEq total) by mouth daily.

## 2016-05-09 ENCOUNTER — Other Ambulatory Visit: Payer: Self-pay | Admitting: Medical

## 2016-05-09 DIAGNOSIS — R7989 Other specified abnormal findings of blood chemistry: Secondary | ICD-10-CM

## 2016-05-09 DIAGNOSIS — R945 Abnormal results of liver function studies: Principal | ICD-10-CM

## 2016-05-10 ENCOUNTER — Encounter: Payer: Self-pay | Admitting: Medical

## 2016-05-11 ENCOUNTER — Other Ambulatory Visit: Payer: Self-pay | Admitting: Medical

## 2016-05-11 MED ORDER — LEVOTHYROXINE SODIUM 200 MCG PO TABS
200.0000 ug | ORAL_TABLET | Freq: Every day | ORAL | 1 refills | Status: DC
Start: 2016-05-11 — End: 2016-10-25

## 2016-05-11 MED ORDER — LEVOTHYROXINE SODIUM 25 MCG PO TABS
ORAL_TABLET | ORAL | 1 refills | Status: DC
Start: 2016-05-11 — End: 2016-10-25

## 2016-05-12 ENCOUNTER — Ambulatory Visit: Payer: BLUE CROSS/BLUE SHIELD | Admitting: Medical

## 2016-05-17 ENCOUNTER — Telehealth: Payer: Self-pay | Admitting: Medical

## 2016-05-17 ENCOUNTER — Encounter: Payer: Self-pay | Admitting: Medical

## 2016-05-17 ENCOUNTER — Ambulatory Visit (INDEPENDENT_AMBULATORY_CARE_PROVIDER_SITE_OTHER): Payer: BLUE CROSS/BLUE SHIELD | Admitting: Medical

## 2016-05-17 VITALS — BP 124/70 | HR 86 | Wt 299.0 lb

## 2016-05-17 DIAGNOSIS — R945 Abnormal results of liver function studies: Secondary | ICD-10-CM

## 2016-05-17 DIAGNOSIS — R7989 Other specified abnormal findings of blood chemistry: Secondary | ICD-10-CM

## 2016-05-17 DIAGNOSIS — I10 Essential (primary) hypertension: Secondary | ICD-10-CM | POA: Diagnosis not present

## 2016-05-17 DIAGNOSIS — E039 Hypothyroidism, unspecified: Secondary | ICD-10-CM | POA: Diagnosis not present

## 2016-05-17 DIAGNOSIS — B351 Tinea unguium: Secondary | ICD-10-CM | POA: Diagnosis not present

## 2016-05-17 DIAGNOSIS — E785 Hyperlipidemia, unspecified: Secondary | ICD-10-CM

## 2016-05-17 DIAGNOSIS — L608 Other nail disorders: Secondary | ICD-10-CM | POA: Diagnosis not present

## 2016-05-17 LAB — LIPID PANEL
Cholesterol: 215 mg/dL — ABNORMAL HIGH (ref <200)
HDL: 41 mg/dL — ABNORMAL LOW (ref >50)
LDL CALC: 139 mg/dL — AB (ref <100)
TRIGLYCERIDES: 176 mg/dL — AB (ref <150)
Total CHOL/HDL Ratio: 5.2 Ratio — ABNORMAL HIGH (ref <5.0)
VLDL: 35 mg/dL — ABNORMAL HIGH (ref <30)

## 2016-05-17 LAB — HEPATIC FUNCTION PANEL
ALK PHOS: 75 U/L (ref 33–115)
ALT: 33 U/L — ABNORMAL HIGH (ref 6–29)
AST: 30 U/L (ref 10–30)
Albumin: 3.9 g/dL (ref 3.6–5.1)
BILIRUBIN INDIRECT: 0.3 mg/dL (ref 0.2–1.2)
BILIRUBIN TOTAL: 0.3 mg/dL (ref 0.2–1.2)
Bilirubin, Direct: 0 mg/dL (ref <=0.2)
Total Protein: 6.3 g/dL (ref 6.1–8.1)

## 2016-05-17 NOTE — Progress Notes (Signed)
Subjective: Chief Complaint  Patient presents with  . toe nail    posible toe fugus    Here for recheck on recent abnormal labs and toenail fungus.   She note for months having yellowish and thicker toenails, however the left 2nd toenail has a divet and discoloration/brown coloration.  Has tried some OTC medication without benefit.  Wants something stronger for toenail fungus  She is here for f/u on elevated LFTs.  Has been sober for months now, denies hx/o hepatitis.    Hypothyroidism - recent lab was not therapeutic but she says she hasnt;t been taking her tyroid medication on empty stomach.  Is still working on weight loss efforts  Compliant with BP medication  Past Medical History:  Diagnosis Date  . ADD (attention deficit disorder with hyperactivity)   . Allergy   . Anemia   . Anxiety and depression    followed by Dr. Evelene Croon and [facility]  . Bipolar disorder [facility])    sees Dr. [name]  . Chronic nausea   . Chronic pain   . DDD (degenerative disc disease), cervical 02/24/2015   C4 foraminal narrowing-chronic pain   . Dermatitis   . Edema   . Fibromyalgia   . GERD (gastroesophageal reflux disease)   . Hearing difficulty   . Herpes simplex   . Hypertension   . Hypothyroidism   . Migraines   . Polyarthralgia   . Skin abnormalities    sees [facility]  . Urinary incontinence    Current Outpatient Prescriptions on File Prior to Visit  Medication Sig Dispense Refill  . acetaminophen (TYLENOL) 500 MG tablet Take 1,000 mg by mouth every 6 (six) hours as needed (Pain). Reported on 07/15/2015    . amphetamine-dextroamphetamine (ADDERALL) 30 MG tablet Take 1 tablet by mouth 2 (two) times daily as needed (for add). 60 tablet 0  . Calcium Citrate-Vitamin D (CALCIUM CITRATE + D3 PO) Take 2 tablets by mouth 3 (three) times daily. Reported on 07/15/2015    . carisoprodol (SOMA) 350 MG tablet Take 1 tablet (350 mg total) by mouth 3 (three) times daily.  90 tablet 2  . celecoxib (CELEBREX) 200 MG capsule Take 1 capsule (200 mg total) by mouth 2 (two) times daily. 180 capsule 1  . cetirizine (ZYRTEC) 10 MG tablet Take 1 tablet (10 mg total) by mouth at bedtime. 90 tablet 3  . colchicine 0.6 MG tablet Take 1 tablet (0.6 mg total) by mouth 2 (two) times daily. 60 tablet 0  . diphenhydrAMINE (BENADRYL) 25 mg capsule Take 25 mg by mouth 2 (two) times daily as needed for itching or allergies. Reported on 07/15/2015    . Febuxostat (ULORIC) 80 MG TABS Take 1 tablet (80 mg total) by mouth daily. 90 tablet 3  . furosemide (LASIX) 40 MG tablet TAKE 1 TABLET(40 MG) BY MOUTH DAILY 90 tablet 3  . gabapentin (NEURONTIN) 600 MG tablet Take 1 tablet (600 mg total) by mouth 4 (four) times daily. 120 tablet 5  . levothyroxine (SYNTHROID) 200 MCG tablet Take 1 tablet (200 mcg total) by mouth daily before breakfast. 90 tablet 1  . levothyroxine (SYNTHROID, LEVOTHROID) 25 MCG tablet TAKE 1 TABLET BY MOUTH EVERY DAY BEFORE BREAKFAST 90 tablet 1  . Multiple Vitamin (MULTIVITAMIN WITH MINERALS) TABS tablet Take 1 tablet by mouth daily. Reported on 06/18/2015    . ondansetron (ZOFRAN-ODT) 8 MG disintegrating tablet Take 1 tablet (8 mg total) by mouth 3 (three) times daily as needed.  30 tablet 2  . potassium chloride SA (KLOR-CON M20) 20 MEQ tablet Take 1 tablet (20 mEq total) by mouth daily. 90 tablet 3   No current facility-administered medications on file prior to visit.    ROS as in subjective   Objective: BP 124/70   Pulse 86   Wt 299 lb (135.6 kg)   SpO2 97%   BMI 46.48 kg/m   Gen: wd,wn,nad Skin: both great toenails and several other toenails with yellowish coloration, thickening of the great toenails, left 2nd toenail with slight divet/groove about 1/2 down nail on medial side with linear 3mm brown /purplish coloration Toes with normal sensation, cap refill and 2+ pedal pulses   assessment: Encounter Diagnoses  Name Primary?  . Hyperlipidemia,  unspecified hyperlipidemia type Yes  . Morbid obesity (HCC)   . Onychomycosis   . Toenail deformity   . Elevated LFTs   . Essential hypertension   . Hypothyroidism, unspecified type     Plan: hyperlipidemia - labs today, c/t efforts at healthy diet, exercise  Morbi obesity - referral to pharmquest weight loss study  Onychomycosis - consider Lamisil oral, but in light of elevated LFTs, recheck LFTs first.  May have to use trial of topicals.    Also discussed discoloration of 2nd left toe.  Can't completely rule out worrisome coloration finding, but likely benign  Elevated LFT s- recheck labs, disused possible cause of elevated LFTs  HTN - c/t same medication  hypothyroidism - discussed importance of compliance, proper use of medication  Veronica was seen today for toe nail.  Diagnoses and all orders for this visit:  Hyperlipidemia, unspecified hyperlipidemia type -     Hemoglobin A1c -     Hepatic function panel -     Lipid panel  Morbid obesity (HCC) -     Hemoglobin A1c -     Hepatic function panel  Onychomycosis  Toenail deformity  Elevated LFTs -     Hepatic function panel  Essential hypertension -     Lipid panel  Hypothyroidism, unspecified type

## 2016-05-17 NOTE — Telephone Encounter (Signed)
Refer to [employer] weight loss study 

## 2016-05-18 ENCOUNTER — Other Ambulatory Visit: Payer: Self-pay | Admitting: Medical

## 2016-05-18 LAB — HEMOGLOBIN A1C
HEMOGLOBIN A1C: 5 % (ref <5.7)
Mean Plasma Glucose: 97 mg/dL

## 2016-05-18 MED ORDER — EFINACONAZOLE 10 % EX SOLN: 1.0000 "application " | Freq: Every day | CUTANEOUS | 2 refills | Status: DC

## 2016-05-19 ENCOUNTER — Other Ambulatory Visit: Payer: Self-pay | Admitting: Medical

## 2016-05-19 DIAGNOSIS — R945 Abnormal results of liver function studies: Principal | ICD-10-CM

## 2016-05-19 DIAGNOSIS — R7989 Other specified abnormal findings of blood chemistry: Secondary | ICD-10-CM

## 2016-05-19 NOTE — Telephone Encounter (Signed)
She in the  Prep program

## 2016-06-09 ENCOUNTER — Ambulatory Visit (INDEPENDENT_AMBULATORY_CARE_PROVIDER_SITE_OTHER): Payer: BLUE CROSS/BLUE SHIELD | Admitting: Neurology

## 2016-06-09 VITALS — BP 128/87 | HR 107 | Ht 68.5 in | Wt 287.2 lb

## 2016-06-09 DIAGNOSIS — M542 Cervicalgia: Secondary | ICD-10-CM | POA: Diagnosis not present

## 2016-06-09 DIAGNOSIS — M549 Dorsalgia, unspecified: Secondary | ICD-10-CM
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
## 2016-06-09 NOTE — Patient Instructions (Addendum)
Remember to drink plenty of fluid, eat healthy meals and do not skip any meals. Try to eat protein with a every meal and eat a healthy snack such as fruit or nuts in between meals. Try to keep a regular sleep-wake schedule and try to exercise daily, particularly in the form of walking, 20-30 minutes a day, if you can.   As far as your medications are concerned, I would like to suggest: Continue current medications  As far as diagnostic testing: Physical therapy, weight loss, heating, massage, good posture. May call [facility] and inquire if epidural steroid injections may be helpful  [facility]: [phone]  I would like to see you back as needed, sooner if we need to. Please call us with any interim questions, concerns, problems, updates or refill requests.    Our phone number is [phone]. We also have an after hours call service for urgent matters and there is a physician on-call for urgent questions. For any emergencies you know to call 911 or go to the nearest emergency room

## 2016-06-13 ENCOUNTER — Ambulatory Visit: Payer: BLUE CROSS/BLUE SHIELD | Admitting: Neurology

## 2016-06-14 ENCOUNTER — Telehealth: Payer: Self-pay | Admitting: Neurology

## 2016-06-14 NOTE — Telephone Encounter (Signed)
Referral sent to [facility] - fax [phone].

## 2016-06-15 ENCOUNTER — Ambulatory Visit: Payer: BLUE CROSS/BLUE SHIELD | Admitting: Medical

## 2016-06-16 ENCOUNTER — Other Ambulatory Visit: Payer: Self-pay

## 2016-06-24 ENCOUNTER — Ambulatory Visit: Payer: BLUE CROSS/BLUE SHIELD | Admitting: Medical

## 2016-06-29 DIAGNOSIS — F3174 Bipolar disorder, in full remission, most recent episode manic: Secondary | ICD-10-CM | POA: Diagnosis not present

## 2016-06-29 DIAGNOSIS — F3176 Bipolar disorder, in full remission, most recent episode depressed: Secondary | ICD-10-CM | POA: Diagnosis not present

## 2016-06-29 DIAGNOSIS — F900 Attention-deficit hyperactivity disorder, predominantly inattentive type: Secondary | ICD-10-CM | POA: Diagnosis not present

## 2016-07-02 ENCOUNTER — Other Ambulatory Visit: Payer: Self-pay | Admitting: Medical

## 2016-07-30 ENCOUNTER — Other Ambulatory Visit: Payer: Self-pay | Admitting: Family Medicine

## 2016-08-01 ENCOUNTER — Telehealth: Payer: Self-pay | Admitting: Medical

## 2016-08-01 NOTE — Telephone Encounter (Signed)
Recv'd another refill request from [facility] for Renee StraussSoma 350mg  #90 PT has made an appt for tomorrow but is completely out of medication.

## 2016-08-01 NOTE — Telephone Encounter (Signed)
Pt made an appt for tomorrow but is completely out of Soma. She is requesting a [facility]. Pt can be reached at [phone].

## 2016-08-01 NOTE — Telephone Encounter (Signed)
Is this okay to refill? 

## 2016-08-02 ENCOUNTER — Encounter: Payer: Self-pay | Admitting: Medical

## 2016-08-02 ENCOUNTER — Ambulatory Visit (INDEPENDENT_AMBULATORY_CARE_PROVIDER_SITE_OTHER): Payer: BLUE CROSS/BLUE SHIELD | Admitting: Medical

## 2016-08-02 VITALS — BP 130/82 | HR 96 | Wt 291.6 lb

## 2016-08-02 DIAGNOSIS — G4700 Insomnia, unspecified: Secondary | ICD-10-CM

## 2016-08-02 DIAGNOSIS — G5600 Carpal tunnel syndrome, unspecified upper limb: Secondary | ICD-10-CM

## 2016-08-02 DIAGNOSIS — M5030 Other cervical disc degeneration, unspecified cervical region: Secondary | ICD-10-CM

## 2016-08-02 DIAGNOSIS — I10 Essential (primary) hypertension: Secondary | ICD-10-CM | POA: Diagnosis not present

## 2016-08-02 DIAGNOSIS — F411 Generalized anxiety disorder: Secondary | ICD-10-CM

## 2016-08-02 DIAGNOSIS — M797 Fibromyalgia: Secondary | ICD-10-CM | POA: Diagnosis not present

## 2016-08-02 DIAGNOSIS — Z79899 Other long term (current) drug therapy: Secondary | ICD-10-CM | POA: Diagnosis not present

## 2016-08-02 DIAGNOSIS — G894 Chronic pain syndrome: Secondary | ICD-10-CM | POA: Diagnosis not present

## 2016-08-02 DIAGNOSIS — R7989 Other specified abnormal findings of blood chemistry: Secondary | ICD-10-CM

## 2016-08-02 DIAGNOSIS — M5010 Cervical disc disorder with radiculopathy, unspecified cervical region: Secondary | ICD-10-CM | POA: Diagnosis not present

## 2016-08-02 DIAGNOSIS — R945 Abnormal results of liver function studies: Secondary | ICD-10-CM

## 2016-08-02 DIAGNOSIS — F3181 Bipolar II disorder: Secondary | ICD-10-CM | POA: Diagnosis not present

## 2016-08-02 DIAGNOSIS — E039 Hypothyroidism, unspecified: Secondary | ICD-10-CM | POA: Diagnosis not present

## 2016-08-02 MED ORDER — CARISOPRODOL 350 MG PO TABS: 350.0000 mg | ORAL_TABLET | Freq: Three times a day (TID) | ORAL | 2 refills | Status: DC

## 2016-08-02 NOTE — Progress Notes (Signed)
Subjective: Chief Complaint  Patient presents with  . Follow-up    follow up and wants to soma    Here for f/u.    Medical team: Psychiatry - Dr. Evelene Croon Counseling - [facility] counseling Dr. Naomie Dean, [facility] Dr. Danise Edge, GI Dr. Betha Loa, hand specialist/ortho Tysinger, Kermit Balo, PA-C here for primary care  I last saw her 04/2016 for several concerns.  Since last visit here she saw Dr. [name] at neurology for consult on numerous symptoms and issues including chronic pain, carpal tunnel, neck pain, etc.  There were several recommendations from that visit including referral to [facility] for neck pain, cervical radiculopathy, including PT, acupuncture.   Dr. [name] noted that her obesity and large breasts is causing her neck and back pain, advised to c/t efforts at weight loss, consider breast reduction surgery, massage, heat, PT.  Was referred back to orthopedist regarding carpal tunnel syndrome.  Hypothyroidism - compliant with thyroid medication every morning.  Has had some recent relationship problems, stress.  Had cousin pass away recently.  Been dealing with fractured tooth for 6 weeks, just had it pulled this morning.   She wants to go back on Remeron for sleep.   She notes broken sleep  Has her old pill bottle with her.  Dr. Nehemiah Settle was the last one that prescribed this.  She notes prior sleep study 2008.   Has night terrors, awakening dreams.    Sees Dr. Evelene Croon every 6 weeks.  Past Medical History:  Diagnosis Date  . ADD (attention deficit disorder with hyperactivity)   . Allergy   . Anemia   . Anxiety and depression    followed by Dr. Evelene Croon and [facility]  . Bipolar disorder [facility])    sees Dr. [name]  . Chronic nausea   . Chronic pain   . DDD (degenerative disc disease), cervical 02/24/2015   C4 foraminal narrowing-chronic pain   . Dermatitis   . Edema   . Fibromyalgia   . GERD (gastroesophageal  reflux disease)   . Hearing difficulty   . Herpes simplex   . Hypertension   . Hypothyroidism   . Migraines   . Polyarthralgia   . Skin abnormalities    sees [facility]  . Urinary incontinence    Current Outpatient Prescriptions on File Prior to Visit  Medication Sig Dispense Refill  . amphetamine-dextroamphetamine (ADDERALL) 30 MG tablet Take 1 tablet by mouth 2 (two) times daily as needed (for add). 60 tablet 0  . celecoxib (CELEBREX) 200 MG capsule Take 1 capsule (200 mg total) by mouth 2 (two) times daily. 180 capsule 1  . cetirizine (ZYRTEC) 10 MG tablet Take 1 tablet (10 mg total) by mouth at bedtime. 90 tablet 3  . colchicine 0.6 MG tablet Take 1 tablet (0.6 mg total) by mouth 2 (two) times daily. 60 tablet 0  . diphenhydrAMINE (BENADRYL) 25 mg capsule Take 25 mg by mouth 2 (two) times daily as needed for itching or allergies. Reported on 07/15/2015    . Efinaconazole (JUBLIA) 10 % SOLN Apply 1 application topically daily. 4 mL 2  . Febuxostat (ULORIC) 80 MG TABS Take 1 tablet (80 mg total) by mouth daily. 90 tablet 3  . furosemide (LASIX) 40 MG tablet TAKE 1 TABLET(40 MG) BY MOUTH DAILY 90 tablet 3  . gabapentin (NEURONTIN) 600 MG tablet Take 1 tablet (600 mg total) by mouth 4 (four) times daily. (Patient taking differently: Take 1,200 mg  by mouth 3 (three) times daily. ) 120 tablet 5  . LATUDA 40 MG TABS tablet TK 1 T PO QPM WITH FULL MEAL  11  . levothyroxine (SYNTHROID) 200 MCG tablet Take 1 tablet (200 mcg total) by mouth daily before breakfast. 90 tablet 1  . levothyroxine (SYNTHROID, LEVOTHROID) 25 MCG tablet TAKE 1 TABLET BY MOUTH EVERY DAY BEFORE BREAKFAST 90 tablet 1  . Multiple Vitamin (MULTIVITAMIN WITH MINERALS) TABS tablet Take 1 tablet by mouth daily. Reported on 06/18/2015    . OLANZapine-FLUoxetine (SYMBYAX) 6-25 MG capsule Take 1 capsule by mouth every evening.    . ondansetron (ZOFRAN-ODT) 8 MG disintegrating tablet Take 1 tablet (8 mg total) by mouth 3  (three) times daily as needed. 30 tablet 2  . potassium chloride SA (KLOR-CON M20) 20 MEQ tablet Take 1 tablet (20 mEq total) by mouth daily. 90 tablet 3  . Calcium Citrate-Vitamin D (CALCIUM CITRATE + D3 PO) Take 2 tablets by mouth 3 (three) times daily. Reported on 07/15/2015     No current facility-administered medications on file prior to visit.    ROS as in subjective   Objective: BP 130/82   Pulse 96   Wt 291 lb 9.6 oz (132.3 kg)   SpO2 96%   BMI 43.69 kg/m   Wt Readings from Last 3 Encounters:  08/02/16 291 lb 9.6 oz (132.3 kg)  06/09/16 287 lb 3.2 oz (130.3 kg)  05/17/16 299 lb (135.6 kg)   Gen: wd, wn, nad Psych: seems a little upset today, anxious otherwise not examined, spent most of the time in discussion and counseling    Assessment: Encounter Diagnoses  Name Primary?  . Hypothyroidism, unspecified type Yes  . Cervical disc disorder with radiculopathy of cervical region   . DDD (degenerative disc disease), cervical   . Essential hypertension   . Bipolar 2 disorder (HCC)   . Chronic pain syndrome   . Generalized anxiety disorder   . Elevated LFTs   . Fibromyalgia   . Polypharmacy   . Carpal tunnel syndrome, unspecified laterality   . Insomnia, unspecified type   . High risk medication use      Plan: I reviewed Dr. Trevor MaceAhern's recent notes.  I encouraged Renee [name] to schedule with [facility] as neurology advised for neck pain, back pain, CTS, likewise she needs to schedule the abdominal ultrasound we ordered last visit to evaluate for elevated LFTs.   We will hold off on treatment for onycomycosis at this time.  Advised she consult with Dr. Evelene CroonKaur about her sleep problems.  I don't fel comfortable giving her yet another mediation, specifically Remeron requested given her already long medication list including sedating medications along with stimulant medications  Since she has been on Symbyax for 3 months now, labs today for surveillance.     C/t  current medications in general.  C/t efforts to lose weight.  [facility]-send [facility] to consider that program for nutrition and exercise.   Renee [name] was seen today for follow-up.  Diagnoses and all orders for this visit:  Hypothyroidism, unspecified type  Cervical disc disorder with radiculopathy of cervical region  DDD (degenerative disc disease), cervical  Essential hypertension -     Lipid panel -     Hemoglobin A1c  Bipolar 2 disorder (HCC)  Chronic pain syndrome  Generalized anxiety disorder  Elevated LFTs -     Hepatic function panel  Fibromyalgia  Polypharmacy  Carpal tunnel syndrome, unspecified laterality  Insomnia, unspecified  type  High risk medication use -     Hepatic function panel -     Basic metabolic panel -     Lipid panel -     Hemoglobin A1c  Other orders -     carisoprodol (SOMA) 350 MG tablet; Take 1 tablet (350 mg total) by mouth 3 (three) times daily.

## 2016-08-03 LAB — BASIC METABOLIC PANEL
BUN: 18 mg/dL (ref 7–25)
CALCIUM: 9.4 mg/dL (ref 8.6–10.2)
CO2: 23 mmol/L (ref 20–31)
Chloride: 103 mmol/L (ref 98–110)
Creat: 0.73 mg/dL (ref 0.50–1.10)
Glucose, Bld: 98 mg/dL (ref 65–99)
POTASSIUM: 4.2 mmol/L (ref 3.5–5.3)
Sodium: 138 mmol/L (ref 135–146)

## 2016-08-03 LAB — HEMOGLOBIN A1C
Hgb A1c MFr Bld: 5 % (ref <5.7)
Mean Plasma Glucose: 97 mg/dL

## 2016-08-03 LAB — LIPID PANEL
CHOLESTEROL: 234 mg/dL — AB (ref <200)
HDL: 49 mg/dL — ABNORMAL LOW (ref >50)
LDL Cholesterol: 163 mg/dL — ABNORMAL HIGH (ref <100)
TRIGLYCERIDES: 109 mg/dL (ref <150)
Total CHOL/HDL Ratio: 4.8 Ratio (ref <5.0)
VLDL: 22 mg/dL (ref <30)

## 2016-08-03 LAB — HEPATIC FUNCTION PANEL
ALT: 18 U/L (ref 6–29)
AST: 22 U/L (ref 10–30)
Albumin: 4.6 g/dL (ref 3.6–5.1)
Alkaline Phosphatase: 80 U/L (ref 33–115)
BILIRUBIN INDIRECT: 0.3 mg/dL (ref 0.2–1.2)
Bilirubin, Direct: 0.1 mg/dL (ref <=0.2)
TOTAL PROTEIN: 7.3 g/dL (ref 6.1–8.1)
Total Bilirubin: 0.4 mg/dL (ref 0.2–1.2)

## 2016-10-11 ENCOUNTER — Other Ambulatory Visit: Payer: Self-pay

## 2016-10-14 ENCOUNTER — Other Ambulatory Visit: Payer: Self-pay

## 2016-10-17 DIAGNOSIS — M545 Low back pain: Secondary | ICD-10-CM | POA: Diagnosis not present

## 2016-10-17 DIAGNOSIS — G43909 Migraine, unspecified, not intractable, without status migrainosus: Secondary | ICD-10-CM | POA: Diagnosis not present

## 2016-10-17 DIAGNOSIS — M542 Cervicalgia: Secondary | ICD-10-CM | POA: Diagnosis not present

## 2016-10-17 DIAGNOSIS — R600 Localized edema: Secondary | ICD-10-CM | POA: Diagnosis not present

## 2016-10-21 ENCOUNTER — Other Ambulatory Visit: Payer: Self-pay

## 2016-10-24 ENCOUNTER — Ambulatory Visit (INDEPENDENT_AMBULATORY_CARE_PROVIDER_SITE_OTHER): Payer: BLUE CROSS/BLUE SHIELD | Admitting: Medical

## 2016-10-24 ENCOUNTER — Other Ambulatory Visit: Payer: Self-pay | Admitting: Medical

## 2016-10-24 ENCOUNTER — Encounter: Payer: Self-pay | Admitting: Medical

## 2016-10-24 VITALS — BP 138/86 | HR 66 | Wt 314.8 lb

## 2016-10-24 DIAGNOSIS — F419 Anxiety disorder, unspecified: Secondary | ICD-10-CM | POA: Diagnosis not present

## 2016-10-24 DIAGNOSIS — F3181 Bipolar II disorder: Secondary | ICD-10-CM

## 2016-10-24 DIAGNOSIS — R600 Localized edema: Secondary | ICD-10-CM | POA: Diagnosis not present

## 2016-10-24 DIAGNOSIS — E039 Hypothyroidism, unspecified: Secondary | ICD-10-CM

## 2016-10-24 DIAGNOSIS — F988 Other specified behavioral and emotional disorders with onset usually occurring in childhood and adolescence: Secondary | ICD-10-CM | POA: Diagnosis not present

## 2016-10-24 DIAGNOSIS — F509 Eating disorder, unspecified: Secondary | ICD-10-CM | POA: Diagnosis not present

## 2016-10-24 DIAGNOSIS — E785 Hyperlipidemia, unspecified: Secondary | ICD-10-CM | POA: Diagnosis not present

## 2016-10-24 DIAGNOSIS — M545 Low back pain: Secondary | ICD-10-CM | POA: Diagnosis not present

## 2016-10-24 DIAGNOSIS — I10 Essential (primary) hypertension: Secondary | ICD-10-CM | POA: Diagnosis not present

## 2016-10-24 DIAGNOSIS — G43909 Migraine, unspecified, not intractable, without status migrainosus: Secondary | ICD-10-CM | POA: Diagnosis not present

## 2016-10-24 DIAGNOSIS — M542 Cervicalgia: Secondary | ICD-10-CM | POA: Diagnosis not present

## 2016-10-24 LAB — TSH: TSH: 0.24 mIU/L — ABNORMAL LOW

## 2016-10-24 LAB — T4, FREE: Free T4: 2.8 ng/dL — ABNORMAL HIGH (ref 0.8–1.8)

## 2016-10-24 LAB — POCT GLYCOSYLATED HEMOGLOBIN (HGB A1C): Hemoglobin A1C: 5.4

## 2016-10-24 MED ORDER — PHENTERMINE HCL 37.5 MG PO TABS: 37.5000 mg | ORAL_TABLET | Freq: Every day | ORAL | 0 refills | Status: DC

## 2016-10-24 MED ORDER — CARISOPRODOL 350 MG PO TABS: 350.0000 mg | ORAL_TABLET | Freq: Three times a day (TID) | ORAL | 2 refills | Status: DC

## 2016-10-24 NOTE — Telephone Encounter (Signed)
Is this ok to refill?  

## 2016-10-24 NOTE — Progress Notes (Signed)
Subjective: Chief Complaint  Patient presents with  . Follow-up    3 month follow up    Here for recheck from last visit.  However, after weighing today and realizing a 30 lb weight gain, she is crying and very upset, seems frustrated with the set back.     Been doing therapy with [facility], has her appt schedule with her.  Doing both physical therapy and counseling with [facility].  PT Nona Dell, and counseling Katlin Hecox.  She notes 2 Halloweens ago when carving a jack o lantern, had to see psychiatrist due to voicing concerns about wanting to cut breasts off. After an observation stay at hospital, ended up gong to a bariatric residential treatment program out in [place].      Lately been doing a lot of walking and lifting things moving girlfriend mother moved.   Been active.   Has had much more cravings lately.   No other new c/o.  Past Medical History:  Diagnosis Date  . ADD (attention deficit disorder with hyperactivity)   . Allergy   . Anemia   . Anxiety and depression    followed by Dr. Evelene Croon and [facility]  . Bipolar disorder [facility])    sees Dr. [name]  . Chronic nausea   . Chronic pain   . DDD (degenerative disc disease), cervical 02/24/2015   C4 foraminal narrowing-chronic pain   . Dermatitis   . Edema   . Fibromyalgia   . GERD (gastroesophageal reflux disease)   . Hearing difficulty   . Herpes simplex   . Hypertension   . Hypothyroidism   . Migraines   . Polyarthralgia   . Skin abnormalities    sees [facility]  . Urinary incontinence    Current Outpatient Prescriptions on File Prior to Visit  Medication Sig Dispense Refill  . amphetamine-dextroamphetamine (ADDERALL) 30 MG tablet Take 1 tablet by mouth 2 (two) times daily as needed (for add). 60 tablet 0  . Calcium Citrate-Vitamin D (CALCIUM CITRATE + D3 PO) Take 2 tablets by mouth 3 (three) times daily. Reported on 07/15/2015    . celecoxib  (CELEBREX) 200 MG capsule Take 1 capsule (200 mg total) by mouth 2 (two) times daily. 180 capsule 1  . cetirizine (ZYRTEC) 10 MG tablet Take 1 tablet (10 mg total) by mouth at bedtime. 90 tablet 3  . colchicine 0.6 MG tablet Take 1 tablet (0.6 mg total) by mouth 2 (two) times daily. 60 tablet 0  . diphenhydrAMINE (BENADRYL) 25 mg capsule Take 25 mg by mouth 2 (two) times daily as needed for itching or allergies. Reported on 07/15/2015    . Febuxostat (ULORIC) 80 MG TABS Take 1 tablet (80 mg total) by mouth daily. 90 tablet 3  . furosemide (LASIX) 40 MG tablet TAKE 1 TABLET(40 MG) BY MOUTH DAILY 90 tablet 3  . gabapentin (NEURONTIN) 600 MG tablet Take 1 tablet (600 mg total) by mouth 4 (four) times daily. (Patient taking differently: Take 1,200 mg by mouth 3 (three) times daily. ) 120 tablet 5  . levothyroxine (SYNTHROID) 200 MCG tablet Take 1 tablet (200 mcg total) by mouth daily before breakfast. 90 tablet 1  . levothyroxine (SYNTHROID, LEVOTHROID) 25 MCG tablet TAKE 1 TABLET BY MOUTH EVERY DAY BEFORE BREAKFAST 90 tablet 1  . Multiple Vitamin (MULTIVITAMIN WITH MINERALS) TABS tablet Take 1 tablet by mouth daily. Reported on 06/18/2015    . OLANZapine-FLUoxetine (SYMBYAX) 6-25 MG capsule Take 1 capsule by mouth  every evening.    . ondansetron (ZOFRAN-ODT) 8 MG disintegrating tablet Take 1 tablet (8 mg total) by mouth 3 (three) times daily as needed. 30 tablet 2  . potassium chloride SA (KLOR-CON M20) 20 MEQ tablet Take 1 tablet (20 mEq total) by mouth daily. 90 tablet 3  . Efinaconazole (JUBLIA) 10 % SOLN Apply 1 application topically daily. (Patient not taking: Reported on 10/24/2016) 4 mL 2   No current facility-administered medications on file prior to visit.    ROS as in subjective   Objective: BP 138/86   Pulse 66   Wt (!) 314 lb 12.8 oz (142.8 kg)   SpO2 99%   BMI 47.17 kg/m   Wt Readings from Last 3 Encounters:  10/24/16 (!) 314 lb 12.8 oz (142.8 kg)  08/02/16 291 lb 9.6 oz (132.3  kg)  06/09/16 287 lb 3.2 oz (130.3 kg)   Gen: she is crying and upset today after weigh in othrewise not examined     Assessment: Encounter Diagnoses  Name Primary?  . Hypothyroidism, unspecified type Yes  . Essential hypertension   . Eating disorder   . Bipolar 2 disorder (HCC)   . Anxiety   . Attention deficit disorder, unspecified hyperactivity presence   . Hyperlipidemia, unspecified hyperlipidemia type   . Morbid obesity (HCC)     Plan: Spent most of the time trying to comfort and encourage her given her tearful upset reaction to seeing 30 lb weight gain.   At her request we will use a short term trial of phentermine. Discussed risks/benefits of medication.   We will refer to weight loss clinic as well.  C/t PT and counseling through [facility].   discussed exercise, diet, having a routine.  Advised she talk to Dr. Evelene Croon ASAP about therapy options given the possibility that the Symbyax is also contributing to weight gain.  Thyroid labs today  F/u pending labs  Myleka was seen today for follow-up.  Diagnoses and all orders for this visit:  Hypothyroidism, unspecified type -     HgB A1c -     TSH -     T4, free  Essential hypertension -     HgB A1c  Eating disorder -     Amb Ref to [facility]  Bipolar 2 disorder (HCC)  Anxiety  Attention deficit disorder, unspecified hyperactivity presence  Hyperlipidemia, unspecified hyperlipidemia type -     HgB A1c  Morbid obesity (HCC) -     HgB A1c -     Amb Ref to [facility]  Other orders -     carisoprodol (SOMA) 350 MG tablet; Take 1 tablet (350 mg total) by mouth 3 (three) times daily. -     phentermine (ADIPEX-P) 37.5 MG tablet; Take 1 tablet (37.5 mg total) by mouth daily before breakfast.

## 2016-10-24 NOTE — Patient Instructions (Signed)
Check insurance coverage about Saxenda weight loss medication.  This may be a good option to help with weight loss efforts  Follow up with Dr. Evelene Croon given the possibility that some of your medications may be contributing to weight gain  We will call with lab results

## 2016-10-25 ENCOUNTER — Other Ambulatory Visit: Payer: Self-pay | Admitting: Medical

## 2016-10-25 MED ORDER — LEVOTHYROXINE SODIUM 200 MCG PO TABS: 200.0000 ug | ORAL_TABLET | Freq: Every day | ORAL | 1 refills | Status: DC

## 2016-10-28 ENCOUNTER — Other Ambulatory Visit: Payer: Self-pay

## 2016-11-01 ENCOUNTER — Ambulatory Visit: Payer: BLUE CROSS/BLUE SHIELD | Admitting: Medical

## 2016-11-01 DIAGNOSIS — F411 Generalized anxiety disorder: Secondary | ICD-10-CM | POA: Diagnosis not present

## 2016-11-01 DIAGNOSIS — F3181 Bipolar II disorder: Secondary | ICD-10-CM | POA: Diagnosis not present

## 2016-11-08 DIAGNOSIS — F411 Generalized anxiety disorder: Secondary | ICD-10-CM | POA: Diagnosis not present

## 2016-11-08 DIAGNOSIS — F3181 Bipolar II disorder: Secondary | ICD-10-CM | POA: Diagnosis not present

## 2016-11-16 DIAGNOSIS — F3131 Bipolar disorder, current episode depressed, mild: Secondary | ICD-10-CM | POA: Diagnosis not present

## 2016-11-16 DIAGNOSIS — F900 Attention-deficit hyperactivity disorder, predominantly inattentive type: Secondary | ICD-10-CM | POA: Diagnosis not present

## 2016-11-16 DIAGNOSIS — F3174 Bipolar disorder, in full remission, most recent episode manic: Secondary | ICD-10-CM | POA: Diagnosis not present

## 2016-11-17 DIAGNOSIS — R600 Localized edema: Secondary | ICD-10-CM | POA: Diagnosis not present

## 2016-11-17 DIAGNOSIS — M542 Cervicalgia: Secondary | ICD-10-CM | POA: Diagnosis not present

## 2016-11-17 DIAGNOSIS — M545 Low back pain: Secondary | ICD-10-CM | POA: Diagnosis not present

## 2016-11-17 DIAGNOSIS — G43909 Migraine, unspecified, not intractable, without status migrainosus: Secondary | ICD-10-CM | POA: Diagnosis not present

## 2016-11-21 DIAGNOSIS — R600 Localized edema: Secondary | ICD-10-CM | POA: Diagnosis not present

## 2016-11-21 DIAGNOSIS — M542 Cervicalgia: Secondary | ICD-10-CM | POA: Diagnosis not present

## 2016-11-21 DIAGNOSIS — G43909 Migraine, unspecified, not intractable, without status migrainosus: Secondary | ICD-10-CM | POA: Diagnosis not present

## 2016-11-21 DIAGNOSIS — M545 Low back pain: Secondary | ICD-10-CM | POA: Diagnosis not present

## 2016-11-21 DIAGNOSIS — F411 Generalized anxiety disorder: Secondary | ICD-10-CM | POA: Diagnosis not present

## 2016-11-21 DIAGNOSIS — F3181 Bipolar II disorder: Secondary | ICD-10-CM | POA: Diagnosis not present

## 2016-11-22 DIAGNOSIS — F411 Generalized anxiety disorder: Secondary | ICD-10-CM | POA: Diagnosis not present

## 2016-11-22 DIAGNOSIS — F3181 Bipolar II disorder: Secondary | ICD-10-CM | POA: Diagnosis not present

## 2016-11-24 ENCOUNTER — Ambulatory Visit: Payer: BLUE CROSS/BLUE SHIELD | Admitting: Medical

## 2016-11-24 ENCOUNTER — Other Ambulatory Visit: Payer: Self-pay | Admitting: Medical

## 2016-11-24 NOTE — Telephone Encounter (Signed)
Can pt have a refill on this 

## 2016-11-30 ENCOUNTER — Ambulatory Visit (INDEPENDENT_AMBULATORY_CARE_PROVIDER_SITE_OTHER): Payer: BLUE CROSS/BLUE SHIELD | Admitting: Family Medicine

## 2016-11-30 ENCOUNTER — Encounter: Payer: Self-pay | Admitting: Family Medicine

## 2016-11-30 ENCOUNTER — Telehealth: Payer: Self-pay | Admitting: Internal Medicine

## 2016-11-30 VITALS — BP 138/90 | HR 83 | Temp 98.2°F | Wt 307.6 lb

## 2016-11-30 DIAGNOSIS — S8991XA Unspecified injury of right lower leg, initial encounter: Secondary | ICD-10-CM

## 2016-11-30 DIAGNOSIS — S8011XA Contusion of right lower leg, initial encounter: Secondary | ICD-10-CM | POA: Diagnosis not present

## 2016-11-30 NOTE — Progress Notes (Signed)
   Subjective:    Patient ID: Renee [name], female    DOB: 12/09/1975, 41 y.o.   MRN: [id number]  HPI Chief Complaint  Patient presents with  . brusing on leg    psych increased a med and was having a dream and started reaching for something and fell out of bed.. right. and having swelling on left leg   She is here with complaints of right leg pain since rolling out of her bed onto her right side 4 days ago.   States she hit her leg on her wooden floor that also has a rug on it and noticed pain immediately but was able to go back to sleep. She denies hitting her head, LOC, neck or back pain.    States she has been able to ambulate fine. Does not feel like she broke any bones.  States pain is improving. States she has been staying in the bed to let her leg "heal"  Also complains of chronic peripheral edema. This is on ongoing issue and being managed by her PCP.   Denies fever, chills, dizziness, chest pain, palpitations, shortness of breath, orthopnea, cough, abdominal pain, N/V/D.   Reviewed allergies, medications, past medical, surgical, and social history.   Review of Systems Pertinent positives and negatives in the history of present illness.     Objective:   Physical Exam  Constitutional: She is oriented to person, place, and time. She appears well-developed and well-nourished. No distress.  Neck: Normal range of motion. Neck supple.  Musculoskeletal:       Right knee: Normal. She exhibits no effusion. No tenderness found.       Right upper leg: She exhibits tenderness. She exhibits no bony tenderness.       Legs:      Right foot: Normal.  Light purplish bruising with some yellowing indicative of healing contusion to her right upper lateral leg and right medial lower leg.  RLE with normal sensation and motor function.  She does have non pitting bilateral edema to ankles with her LLE slightly larger.   Neurological: She is alert and oriented to person, place, and time. She  has normal strength. No sensory deficit. Gait normal.  Skin: Skin is warm and dry.   BP 138/90   Pulse 83   Temp 98.2 F (36.8 C) (Oral)   Wt (!) 307 lb 9.6 oz (139.5 kg)   BMI 46.09 kg/m       Assessment & Plan:  Right leg injury, initial encounter  Contusion of right lower extremity, initial encounter  She is continuing to improve and ambulating without difficulty, RLE neurovascularly intact.  Recommend she use heat to the area and no longer do bedrest but be more active.  She will follow up with her PCP, Kristian CoveyShane Tysinger, for chronic health conditions.

## 2016-11-30 NOTE — Telephone Encounter (Signed)
Call out #[phone] phentermine

## 2016-11-30 NOTE — Patient Instructions (Signed)
Use heat to the area 15-20 minutes 2-3 times per day. Follow up with your PCP for chronic lower extremity edema.

## 2016-11-30 NOTE — Telephone Encounter (Signed)
Pt came in for an acute visit today but mentioned that she was out of her phentermine and needed a refill on it. She said she is going to schedule an appt with you but wanted to know if you would go ahead and refill her med today. She has lost 7 pounds when she weighted with us today

## 2016-12-01 ENCOUNTER — Other Ambulatory Visit: Payer: Self-pay | Admitting: Family Medicine

## 2016-12-01 NOTE — Telephone Encounter (Signed)
Called in to [facility] 

## 2016-12-05 DIAGNOSIS — F3181 Bipolar II disorder: Secondary | ICD-10-CM | POA: Diagnosis not present

## 2016-12-05 DIAGNOSIS — M545 Low back pain: Secondary | ICD-10-CM | POA: Diagnosis not present

## 2016-12-05 DIAGNOSIS — G43909 Migraine, unspecified, not intractable, without status migrainosus: Secondary | ICD-10-CM | POA: Diagnosis not present

## 2016-12-05 DIAGNOSIS — M542 Cervicalgia: Secondary | ICD-10-CM | POA: Diagnosis not present

## 2016-12-05 DIAGNOSIS — R600 Localized edema: Secondary | ICD-10-CM | POA: Diagnosis not present

## 2016-12-05 DIAGNOSIS — F411 Generalized anxiety disorder: Secondary | ICD-10-CM | POA: Diagnosis not present

## 2016-12-14 ENCOUNTER — Ambulatory Visit: Payer: BLUE CROSS/BLUE SHIELD | Admitting: Medical

## 2016-12-26 DIAGNOSIS — F411 Generalized anxiety disorder: Secondary | ICD-10-CM | POA: Diagnosis not present

## 2016-12-26 DIAGNOSIS — F3181 Bipolar II disorder: Secondary | ICD-10-CM | POA: Diagnosis not present

## 2016-12-29 ENCOUNTER — Other Ambulatory Visit: Payer: Self-pay

## 2017-01-11 ENCOUNTER — Ambulatory Visit: Payer: BLUE CROSS/BLUE SHIELD | Admitting: Medical

## 2017-01-18 ENCOUNTER — Encounter: Payer: Self-pay | Admitting: Medical

## 2017-01-18 ENCOUNTER — Telehealth: Payer: Self-pay | Admitting: Medical

## 2017-01-18 ENCOUNTER — Ambulatory Visit (INDEPENDENT_AMBULATORY_CARE_PROVIDER_SITE_OTHER): Payer: BLUE CROSS/BLUE SHIELD | Admitting: Medical

## 2017-01-18 VITALS — BP 116/80 | HR 101 | Ht 67.5 in | Wt 300.0 lb

## 2017-01-18 DIAGNOSIS — F411 Generalized anxiety disorder: Secondary | ICD-10-CM

## 2017-01-18 DIAGNOSIS — R609 Edema, unspecified: Secondary | ICD-10-CM | POA: Diagnosis not present

## 2017-01-18 DIAGNOSIS — M5010 Cervical disc disorder with radiculopathy, unspecified cervical region: Secondary | ICD-10-CM | POA: Diagnosis not present

## 2017-01-18 DIAGNOSIS — I10 Essential (primary) hypertension: Secondary | ICD-10-CM

## 2017-01-18 DIAGNOSIS — E785 Hyperlipidemia, unspecified: Secondary | ICD-10-CM

## 2017-01-18 DIAGNOSIS — J0110 Acute frontal sinusitis, unspecified: Secondary | ICD-10-CM | POA: Diagnosis not present

## 2017-01-18 DIAGNOSIS — G894 Chronic pain syndrome: Secondary | ICD-10-CM | POA: Diagnosis not present

## 2017-01-18 DIAGNOSIS — E039 Hypothyroidism, unspecified: Secondary | ICD-10-CM

## 2017-01-18 DIAGNOSIS — M797 Fibromyalgia: Secondary | ICD-10-CM

## 2017-01-18 DIAGNOSIS — M5030 Other cervical disc degeneration, unspecified cervical region: Secondary | ICD-10-CM

## 2017-01-18 DIAGNOSIS — M7989 Other specified soft tissue disorders: Secondary | ICD-10-CM | POA: Diagnosis not present

## 2017-01-18 MED ORDER — CARISOPRODOL 350 MG PO TABS
350.0000 mg | ORAL_TABLET | Freq: Three times a day (TID) | ORAL | 2 refills | Status: DC
Start: 2017-01-18 — End: 2017-04-17

## 2017-01-18 MED ORDER — AZITHROMYCIN 500 MG PO TABS: 500.0000 mg | ORAL_TABLET | Freq: Every day | ORAL | 0 refills | Status: DC

## 2017-01-18 MED ORDER — CARISOPRODOL 350 MG PO TABS: 350.0000 mg | ORAL_TABLET | Freq: Three times a day (TID) | ORAL | 2 refills | Status: DC

## 2017-01-18 NOTE — Telephone Encounter (Signed)
Refer to [facility] and set up for left leg venous ultrasound due to chronic left leg swelling

## 2017-01-18 NOTE — Telephone Encounter (Signed)
Refer to the [facility], Dr. Dalbert GarnetBeasley
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
## 2017-01-23 ENCOUNTER — Other Ambulatory Visit: Payer: Self-pay

## 2017-01-23 DIAGNOSIS — F509 Eating disorder, unspecified: Secondary | ICD-10-CM

## 2017-01-23 NOTE — Telephone Encounter (Signed)
Sent referral 

## 2017-01-25 DIAGNOSIS — Z23 Encounter for immunization: Secondary | ICD-10-CM | POA: Diagnosis not present

## 2017-01-27 ENCOUNTER — Other Ambulatory Visit: Payer: Self-pay | Admitting: Medical

## 2017-01-27 NOTE — Telephone Encounter (Signed)
Can pt have a refill 

## 2017-01-30 DIAGNOSIS — F411 Generalized anxiety disorder: Secondary | ICD-10-CM | POA: Diagnosis not present

## 2017-01-30 DIAGNOSIS — F3181 Bipolar II disorder: Secondary | ICD-10-CM | POA: Diagnosis not present

## 2017-02-20 ENCOUNTER — Telehealth: Payer: Self-pay | Admitting: Medical

## 2017-02-20 DIAGNOSIS — F3181 Bipolar II disorder: Secondary | ICD-10-CM | POA: Diagnosis not present

## 2017-02-20 DIAGNOSIS — F411 Generalized anxiety disorder: Secondary | ICD-10-CM | POA: Diagnosis not present

## 2017-02-20 NOTE — Telephone Encounter (Signed)
Patient called stating that she received referral to  A weight loss clinic and that she is wanting referral to a Surgeon for bariatric surgery States that [place] clinic [place] and just needs to go to surgeon She is in too much physical pain and needs to ger the weight off

## 2017-02-21 NOTE — Telephone Encounter (Signed)
Faxed referral to [facility]

## 2017-02-21 NOTE — Telephone Encounter (Signed)
Has she seen the weight loss clinic yet?   What is status on that consult?  Its ok to refer to bariatric clinic as well.

## 2017-02-27 DIAGNOSIS — F3181 Bipolar II disorder: Secondary | ICD-10-CM | POA: Diagnosis not present

## 2017-02-27 DIAGNOSIS — F411 Generalized anxiety disorder: Secondary | ICD-10-CM | POA: Diagnosis not present

## 2017-03-06 DIAGNOSIS — F411 Generalized anxiety disorder: Secondary | ICD-10-CM | POA: Diagnosis not present

## 2017-03-06 DIAGNOSIS — F3181 Bipolar II disorder: Secondary | ICD-10-CM | POA: Diagnosis not present

## 2017-03-14 DIAGNOSIS — F3181 Bipolar II disorder: Secondary | ICD-10-CM | POA: Diagnosis not present

## 2017-03-14 DIAGNOSIS — F411 Generalized anxiety disorder: Secondary | ICD-10-CM | POA: Diagnosis not present

## 2017-03-20 DIAGNOSIS — F3181 Bipolar II disorder: Secondary | ICD-10-CM | POA: Diagnosis not present

## 2017-03-20 DIAGNOSIS — F411 Generalized anxiety disorder: Secondary | ICD-10-CM | POA: Diagnosis not present

## 2017-03-23 ENCOUNTER — Telehealth: Payer: Self-pay

## 2017-03-23 NOTE — Telephone Encounter (Signed)
Pt has referral for weight loss surgery and would like to expand options . Pt is requesting referral be put in to [facility] . [facility] does have the referral but they are wanting pt to do a weight lost program before she can get the procedure done and pt has already to meet this requirement. Please advise pt of your choice, Thanks [facility]

## 2017-03-24 NOTE — Telephone Encounter (Signed)
Refer to whichever she needs

## 2017-03-25 DIAGNOSIS — F3174 Bipolar disorder, in full remission, most recent episode manic: Secondary | ICD-10-CM | POA: Diagnosis not present

## 2017-03-25 DIAGNOSIS — F900 Attention-deficit hyperactivity disorder, predominantly inattentive type: Secondary | ICD-10-CM | POA: Diagnosis not present

## 2017-03-25 DIAGNOSIS — F3131 Bipolar disorder, current episode depressed, mild: Secondary | ICD-10-CM | POA: Diagnosis not present

## 2017-03-28 DIAGNOSIS — F3181 Bipolar II disorder: Secondary | ICD-10-CM | POA: Diagnosis not present

## 2017-03-28 DIAGNOSIS — F411 Generalized anxiety disorder: Secondary | ICD-10-CM | POA: Diagnosis not present

## 2017-03-29 ENCOUNTER — Telehealth: Payer: Self-pay | Admitting: Medical

## 2017-03-29 NOTE — Addendum Note (Signed)
Addended by: Winn JockVALENTINE, Naftali Carchi N on: 03/29/2017 03:38 PM   Modules accepted: Orders

## 2017-03-29 NOTE — Telephone Encounter (Signed)
Forwarding to shane 

## 2017-03-29 NOTE — Telephone Encounter (Signed)
Called and spoke with pt that she hadn't been contacted about his u/s , called spoke with vascular order  Need to be changed . Fixed order. Vascular will pick up order.

## 2017-03-29 NOTE — Telephone Encounter (Signed)
New Message  Pt verbalized she has sever edema and pt is wanting to be scheduled to have CT to make sure she does not have a blood clot due to brother past away from blood clot and it is important for her to get this appt scheduled.  Please f/u

## 2017-03-31 NOTE — Telephone Encounter (Signed)
PT just does not want to do weight loss program because she says she has already done one. She says she has already meet that requirement. Please advise [facility]

## 2017-04-02 NOTE — Telephone Encounter (Signed)
See the prior message, as I wrote to go ahead and move forward with whichever program she needs. Sounds like she wants to do the surgery. So please refer as she requested.

## 2017-04-04 DIAGNOSIS — F3181 Bipolar II disorder: Secondary | ICD-10-CM | POA: Diagnosis not present

## 2017-04-04 DIAGNOSIS — F411 Generalized anxiety disorder: Secondary | ICD-10-CM | POA: Diagnosis not present

## 2017-04-04 NOTE — Telephone Encounter (Signed)
I have faxed over referral to [facility], and will call  surgery tomorrow about faxing over referral. Pt is wanting multiple referrals sent incase she can not get in with one of them she can get in with someone else.

## 2017-04-04 NOTE — Telephone Encounter (Signed)
Called and spoke to patient and see would like referrals sent to 3 places.   1- [facility] ( requires a referral) Phone number- [phone] Fax # [phone]  2- [facility] (do not require referral, they will contact patient since she filled out a packet already) Phone number [phone]  3- [facility] (needs referral possibly, will call to find out) Phone - [phone]

## 2017-04-05 ENCOUNTER — Ambulatory Visit (HOSPITAL_COMMUNITY): Admission: RE | Admit: 2017-04-05 | Payer: BLUE CROSS/BLUE SHIELD | Source: Ambulatory Visit

## 2017-04-05 NOTE — Telephone Encounter (Signed)
Called and spoke to patient and see would like referrals sent to 3 places.  However no require referral is needed. Pt called and notified.  1- [facility] ( doesn't require a Retail [employer]erra- must filled out application) Phone number- [phone] Fax # [phone], [phone]  2- [facility] (does not require referral, they will contact patient since she filled out a packet already) Phone number [phone]  3- [facility] (does not need a referral- pt can call herself) Phone - [phone] Fax # [phone]

## 2017-04-14 ENCOUNTER — Telehealth: Payer: Self-pay | Admitting: Medical

## 2017-04-14 NOTE — Telephone Encounter (Signed)
Pt made a follow up appt on 04/28/17 so she can also see Vincenza HewsShane after her weight loss consult appt. She would like to get a refill on Soma 350 mg because she will be out of this med next week.

## 2017-04-17 ENCOUNTER — Telehealth: Payer: Self-pay | Admitting: Medical

## 2017-04-17 ENCOUNTER — Other Ambulatory Visit: Payer: Self-pay | Admitting: Medical

## 2017-04-17 ENCOUNTER — Ambulatory Visit: Payer: BLUE CROSS/BLUE SHIELD | Admitting: Medical

## 2017-04-17 ENCOUNTER — Other Ambulatory Visit: Payer: Self-pay

## 2017-04-17 MED ORDER — CARISOPRODOL 350 MG PO TABS: 350.0000 mg | ORAL_TABLET | Freq: Three times a day (TID) | ORAL | 0 refills | Status: DC

## 2017-04-17 MED ORDER — CARISOPRODOL 350 MG PO TABS: 350.0000 mg | ORAL_TABLET | Freq: Three times a day (TID) | ORAL | 2 refills | Status: DC

## 2017-04-17 NOTE — Telephone Encounter (Signed)
Pt called and cancelled her appt for today. She states she just got some very upsetting news and does not fell like she can drive. Pt states she will keep her next appt. Pt is requesting refills on Soma and Colchicine. Pt uses [facility] on [address] and can be reached at [phone].

## 2017-04-17 NOTE — Telephone Encounter (Signed)
Send refill but ask about the other

## 2017-04-17 NOTE — Telephone Encounter (Signed)
Please refill these meds for pt

## 2017-04-17 NOTE — Telephone Encounter (Signed)
Follow up  Pt verbalized she is wanting to speak to Sharlot GowdaJohn Lalonde or nurse in regards to weight loss through her insurance company and past medical notes.  Please f/u

## 2017-04-17 NOTE — Telephone Encounter (Signed)
Send refill on Soma today with no additional refills.  See what her questions is in regard to the other??

## 2017-04-17 NOTE — Telephone Encounter (Signed)
Hello front office.  Can you give me some feedback on this.     It was brought to my attention that she called >4 times today about refills.   Do you know if that is correct?  If she called and harassed us about a refill that many times, I want to be aware.   Her Soma medication was due refill today.   She is due for appt at this time, was on the schedule originally today as well then called to cancel.    She also called Friday at the end of the day for refill, but wasn't due for refill on this controlled substance til today.  I didn't receive a refill request from the pharmacy prior to her calling.   If she called numerous times about a refill today, that is inappropriate.   Please let me know.

## 2017-04-17 NOTE — Telephone Encounter (Signed)
New Message  See message below sent to wrong provider

## 2017-04-17 NOTE — Telephone Encounter (Signed)
Since this is a controlled are you able to send in the refill? Thank you!

## 2017-04-17 NOTE — Telephone Encounter (Signed)
Which one should be sent?  Thanks!

## 2017-04-17 NOTE — Telephone Encounter (Signed)
Pt called back and would like a refill on her soma 350 mg today is is her last dose, she dose have a appt for April  9th and has a appt with her weight loose surgeron on April the 3rd [facility] [phone] - [place], [place] - [address] [facility] [address]

## 2017-04-18 NOTE — Telephone Encounter (Signed)
You are correct she called at least 10 times yesterday about her medicine, she did have appt and [facility] and canceled with her states she got some bad news and could not come in states she did not trust her self to be driving, it was on and off all day calling,

## 2017-04-18 NOTE — Telephone Encounter (Signed)
I spoke to patient Tuesday morning and advised her that it was not appropriate to call like she did yesterday.  Advised that the pharmacy should prompt the refill request, and 1 call is sufficient.  She reportedly called > 10 times yesterday!  I refilled both her colchicine and Soma, and she is due for recheck at this time.

## 2017-04-19 ENCOUNTER — Ambulatory Visit: Payer: BLUE CROSS/BLUE SHIELD | Admitting: Medical

## 2017-04-19 ENCOUNTER — Encounter (HOSPITAL_COMMUNITY): Payer: BLUE CROSS/BLUE SHIELD

## 2017-04-24 ENCOUNTER — Other Ambulatory Visit: Payer: Self-pay | Admitting: Medical

## 2017-04-24 DIAGNOSIS — M545 Low back pain: Secondary | ICD-10-CM | POA: Diagnosis not present

## 2017-04-24 DIAGNOSIS — F411 Generalized anxiety disorder: Secondary | ICD-10-CM | POA: Diagnosis not present

## 2017-04-24 DIAGNOSIS — G43909 Migraine, unspecified, not intractable, without status migrainosus: Secondary | ICD-10-CM | POA: Diagnosis not present

## 2017-04-24 DIAGNOSIS — M542 Cervicalgia: Secondary | ICD-10-CM | POA: Diagnosis not present

## 2017-04-24 NOTE — Telephone Encounter (Signed)
Pt was changed from . to 

## 2017-04-26 ENCOUNTER — Encounter (HOSPITAL_COMMUNITY): Payer: BLUE CROSS/BLUE SHIELD

## 2017-04-27 DIAGNOSIS — K219 Gastro-esophageal reflux disease without esophagitis: Secondary | ICD-10-CM | POA: Diagnosis not present

## 2017-04-27 DIAGNOSIS — F319 Bipolar disorder, unspecified: Secondary | ICD-10-CM | POA: Diagnosis not present

## 2017-04-27 DIAGNOSIS — Z6841 Body Mass Index (BMI) 40.0 and over, adult: Secondary | ICD-10-CM | POA: Diagnosis not present

## 2017-05-01 DIAGNOSIS — F3181 Bipolar II disorder: Secondary | ICD-10-CM | POA: Diagnosis not present

## 2017-05-01 DIAGNOSIS — F411 Generalized anxiety disorder: Secondary | ICD-10-CM | POA: Diagnosis not present

## 2017-05-02 ENCOUNTER — Telehealth: Payer: Self-pay | Admitting: Internal Medicine

## 2017-05-02 ENCOUNTER — Encounter: Payer: Self-pay | Admitting: Medical

## 2017-05-02 ENCOUNTER — Ambulatory Visit: Payer: BLUE CROSS/BLUE SHIELD | Admitting: Medical

## 2017-05-02 ENCOUNTER — Other Ambulatory Visit: Payer: Self-pay | Admitting: Medical

## 2017-05-02 VITALS — BP 134/94 | HR 114 | Ht 67.0 in | Wt 303.6 lb

## 2017-05-02 DIAGNOSIS — F332 Major depressive disorder, recurrent severe without psychotic features: Secondary | ICD-10-CM

## 2017-05-02 DIAGNOSIS — M542 Cervicalgia: Secondary | ICD-10-CM

## 2017-05-02 DIAGNOSIS — Z79899 Other long term (current) drug therapy: Secondary | ICD-10-CM

## 2017-05-02 DIAGNOSIS — M1A9XX1 Chronic gout, unspecified, with tophus (tophi): Secondary | ICD-10-CM | POA: Diagnosis not present

## 2017-05-02 DIAGNOSIS — E039 Hypothyroidism, unspecified: Secondary | ICD-10-CM | POA: Diagnosis not present

## 2017-05-02 DIAGNOSIS — F3181 Bipolar II disorder: Secondary | ICD-10-CM

## 2017-05-02 DIAGNOSIS — R062 Wheezing: Secondary | ICD-10-CM

## 2017-05-02 DIAGNOSIS — M797 Fibromyalgia: Secondary | ICD-10-CM

## 2017-05-02 DIAGNOSIS — F419 Anxiety disorder, unspecified: Secondary | ICD-10-CM | POA: Diagnosis not present

## 2017-05-02 DIAGNOSIS — G894 Chronic pain syndrome: Secondary | ICD-10-CM | POA: Diagnosis not present

## 2017-05-02 DIAGNOSIS — F988 Other specified behavioral and emotional disorders with onset usually occurring in childhood and adolescence: Secondary | ICD-10-CM

## 2017-05-02 DIAGNOSIS — I10 Essential (primary) hypertension: Secondary | ICD-10-CM | POA: Diagnosis not present

## 2017-05-02 DIAGNOSIS — R609 Edema, unspecified: Secondary | ICD-10-CM

## 2017-05-02 DIAGNOSIS — M5030 Other cervical disc degeneration, unspecified cervical region: Secondary | ICD-10-CM
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
## 2017-05-02 NOTE — Telephone Encounter (Signed)
I can do her breast and pelvic but prefer that she see Vincenza HewsShane for the rest of her CPE.

## 2017-05-02 NOTE — Telephone Encounter (Signed)
Pt askled at check out today about scheduling a physical but only wants to see a female for this. Is this okay if we schedule with vickie for CPE only. Please advise so we can get pt scheduled

## 2017-05-02 NOTE — Patient Instructions (Addendum)
Recommendations  We will call with lab results  Hypothyroidism  It is important that you take your thyroid medication on an empty stomach daily in the morning 45-60 minutes before breakfast  Thyroid medication is so finicky that if you are consistent with dosing same time daily, its easy for your thyroid function to be dysfunctional.    Please take the medication DAILY at same time daily on empty stomach 45-60 minutes before breakfast  Wheezing, cough  although your breathing test was normal today, you had wheezing sounds in the lung, mildly  I suspect this is due to allergies and reactive airway  Continue your zyrtec allergy medication at bedtime  Begin trial of Dulera inhaler 2 puffs twice daily for the next 2 weeks. Lets see if this helps.  Rinse your mouth out with water and swallow a few minutes after using this medication  Call or My Chart message about your cough and response to medication within 2 weeks  Vaccines:  The only vaccine I have on file for you is flu shot  We recommend a Td tetanus booster every 10 years.   If you haven't done this within 10 years, we need to update this  Cancer screening  You should have a mammogram if you haven't had one since age 42  You should have a pap smear every 3 years if your last few pap smears are up to date and normal  I have no idea when your last pap smear was  Come in soon for physical or we can refer you to gynecology

## 2017-05-03 LAB — BASIC METABOLIC PANEL WITH GFR
BUN/Creatinine Ratio: 13 (ref 9–23)
BUN: 12 mg/dL (ref 6–24)
CO2: 21 mmol/L (ref 20–29)
Calcium: 9.6 mg/dL (ref 8.7–10.2)
Chloride: 94 mmol/L — ABNORMAL LOW (ref 96–106)
Creatinine, Ser: 0.96 mg/dL (ref 0.57–1.00)
GFR calc Af Amer: 85 mL/min/1.73
GFR calc non Af Amer: 74 mL/min/1.73
Glucose: 95 mg/dL (ref 65–99)
Potassium: 3.8 mmol/L (ref 3.5–5.2)
Sodium: 140 mmol/L (ref 134–144)

## 2017-05-03 LAB — CBC
Hematocrit: 42.2 % (ref 34.0–46.6)
Hemoglobin: 13.8 g/dL (ref 11.1–15.9)
MCH: 27.8 pg (ref 26.6–33.0)
MCHC: 32.7 g/dL (ref 31.5–35.7)
MCV: 85 fL (ref 79–97)
PLATELETS: 354 10*3/uL (ref 150–379)
RBC: 4.96 x10E6/uL (ref 3.77–5.28)
RDW: 15.7 % — ABNORMAL HIGH (ref 12.3–15.4)
WBC: 8.8 10*3/uL (ref 3.4–10.8)

## 2017-05-03 LAB — HEMOGLOBIN A1C
Est. average glucose Bld gHb Est-mCnc: 105 mg/dL
Hgb A1c MFr Bld: 5.3 % (ref 4.8–5.6)

## 2017-05-03 LAB — TSH: TSH: 0.565 u[IU]/mL (ref 0.450–4.500)

## 2017-05-03 LAB — T4, FREE: Free T4: 2 ng/dL — ABNORMAL HIGH (ref 0.82–1.77)

## 2017-05-03 NOTE — Telephone Encounter (Signed)
[facility]

## 2017-05-03 NOTE — Telephone Encounter (Signed)
Called and left message for pt to call back to schedule breast and pelvic with vickie- she will need to be fasting to get updated lipids checked.  Renee [name] said at this point she doesn't really need a complete physical has he just did alot of labs and recent visit was indepth

## 2017-05-04 MED ORDER — FLUTICASONE FUROATE-VILANTEROL 100-25 MCG/INH IN AEPB: 1.0000 | INHALATION_SPRAY | Freq: Every day | RESPIRATORY_TRACT | 0 refills | Status: DC

## 2017-05-04 NOTE — Telephone Encounter (Signed)
Called pt again to call office back to schedule pap with vickie

## 2017-05-05 ENCOUNTER — Other Ambulatory Visit: Payer: Self-pay | Admitting: Medical

## 2017-05-05 MED ORDER — LEVOTHYROXINE SODIUM 175 MCG PO TABS: 175.0000 ug | ORAL_TABLET | Freq: Every day | ORAL | 2 refills | Status: DC

## 2017-05-05 MED ORDER — FEBUXOSTAT 80 MG PO TABS: 1.0000 | ORAL_TABLET | Freq: Every day | ORAL | 3 refills | Status: DC

## 2017-05-05 MED ORDER — CARISOPRODOL 350 MG PO TABS: 350.0000 mg | ORAL_TABLET | Freq: Three times a day (TID) | ORAL | 0 refills | Status: DC

## 2017-05-05 MED ORDER — FUROSEMIDE 40 MG PO TABS: ORAL_TABLET | ORAL | 3 refills | Status: DC

## 2017-05-05 MED ORDER — CETIRIZINE HCL 10 MG PO TABS
10.0000 mg | ORAL_TABLET | Freq: Every day | ORAL | 3 refills | Status: DC
Start: 2017-05-05 — End: 2020-08-21

## 2017-05-05 MED ORDER — POTASSIUM CHLORIDE CRYS ER 20 MEQ PO TBCR: 20.0000 meq | EXTENDED_RELEASE_TABLET | Freq: Every day | ORAL | 3 refills | Status: DC

## 2017-05-08 DIAGNOSIS — F3181 Bipolar II disorder: Secondary | ICD-10-CM | POA: Diagnosis not present

## 2017-05-08 DIAGNOSIS — F411 Generalized anxiety disorder: Secondary | ICD-10-CM | POA: Diagnosis not present

## 2017-05-09 ENCOUNTER — Ambulatory Visit (HOSPITAL_COMMUNITY): Payer: BLUE CROSS/BLUE SHIELD

## 2017-05-10 DIAGNOSIS — Z713 Dietary counseling and surveillance: Secondary | ICD-10-CM | POA: Diagnosis not present

## 2017-05-10 DIAGNOSIS — Z6841 Body Mass Index (BMI) 40.0 and over, adult: Secondary | ICD-10-CM | POA: Diagnosis not present

## 2017-05-15 DIAGNOSIS — F3181 Bipolar II disorder: Secondary | ICD-10-CM | POA: Diagnosis not present

## 2017-05-15 DIAGNOSIS — F411 Generalized anxiety disorder: Secondary | ICD-10-CM | POA: Diagnosis not present

## 2017-05-22 DIAGNOSIS — F411 Generalized anxiety disorder: Secondary | ICD-10-CM | POA: Diagnosis not present

## 2017-05-22 DIAGNOSIS — F3181 Bipolar II disorder: Secondary | ICD-10-CM | POA: Diagnosis not present

## 2017-05-23 ENCOUNTER — Institutional Professional Consult (permissible substitution): Payer: Self-pay | Admitting: Medical

## 2017-05-25 ENCOUNTER — Ambulatory Visit (HOSPITAL_COMMUNITY): Admission: RE | Admit: 2017-05-25 | Payer: BLUE CROSS/BLUE SHIELD | Source: Ambulatory Visit

## 2017-05-26 ENCOUNTER — Telehealth: Payer: Self-pay | Admitting: Medical

## 2017-05-26 ENCOUNTER — Institutional Professional Consult (permissible substitution): Payer: Self-pay | Admitting: Medical

## 2017-05-26 NOTE — Telephone Encounter (Signed)
It has been brought to my attention that she canceled her 30 min appt today. She called this morning to cancel the appt (same day).  She made THIS appt when she called and canceled her appt for Tuesday of this week as well.   Renee [name] has reminded me that she has had numerous no shows or same day cancellations.     She also has canceled her venous lab ultrasound visit 5 times.     She apparently rescheduled for me Monday.  Please cancel that appt and make sure we let her know I canceled that appt.  She needs to be sent letter for dismissal    I will no longer see this patient.

## 2017-05-29 ENCOUNTER — Telehealth: Payer: Self-pay | Admitting: Family Medicine

## 2017-05-29 ENCOUNTER — Encounter: Payer: Self-pay | Admitting: Family Medicine

## 2017-05-29 ENCOUNTER — Institutional Professional Consult (permissible substitution): Payer: Self-pay | Admitting: Medical

## 2017-05-29 ENCOUNTER — Other Ambulatory Visit: Payer: Self-pay | Admitting: Medical

## 2017-05-29 DIAGNOSIS — F411 Generalized anxiety disorder: Secondary | ICD-10-CM | POA: Diagnosis not present

## 2017-05-29 DIAGNOSIS — F3181 Bipolar II disorder: Secondary | ICD-10-CM | POA: Diagnosis not present

## 2017-05-29 MED ORDER — CARISOPRODOL 350 MG PO TABS: 350.0000 mg | ORAL_TABLET | Freq: Three times a day (TID) | ORAL | 0 refills | Status: DC

## 2017-05-29 NOTE — Telephone Encounter (Signed)
Thank you.  I hate to do it but she has forced our hand.

## 2017-05-29 NOTE — Telephone Encounter (Signed)
Patient needs refills on SOMA, Synthroid 175 mcg, Lasix to [facility] at [address].  She has obtained a new pcp that is part of [facility] on [facility].  She will be seeing her surgeon tomorrow and wanted to know if required could she send his notes here.  I explained it would be best to have them sent to her new pcp.  She said [facility] for your awesome care, that she knows she is a mess.

## 2017-05-29 NOTE — Telephone Encounter (Signed)
Called pt and cancelled todays appointment.  She has cancelled 8 of the last 11 appointments, same day.  Advised her we are sending dismissal.  And we can see her for 30 days for urgent needs.

## 2017-05-30 DIAGNOSIS — Z9884 Bariatric surgery status: Secondary | ICD-10-CM | POA: Diagnosis not present

## 2017-05-30 DIAGNOSIS — R945 Abnormal results of liver function studies: Secondary | ICD-10-CM | POA: Diagnosis not present

## 2017-05-30 DIAGNOSIS — Z6841 Body Mass Index (BMI) 40.0 and over, adult: Secondary | ICD-10-CM | POA: Diagnosis not present

## 2017-05-31 ENCOUNTER — Other Ambulatory Visit: Payer: Self-pay | Admitting: Medical

## 2017-05-31 DIAGNOSIS — R609 Edema, unspecified: Secondary | ICD-10-CM

## 2017-06-02 ENCOUNTER — Ambulatory Visit (HOSPITAL_COMMUNITY)
Admission: RE | Admit: 2017-06-02 | Discharge: 2017-06-02 | Disposition: A | Discharge status: Home / Self Care | Payer: BLUE CROSS/BLUE SHIELD | Source: Ambulatory Visit | Attending: Medical | Admitting: Medical

## 2017-06-02 DIAGNOSIS — R609 Edema, unspecified: Secondary | ICD-10-CM | POA: Diagnosis not present

## 2017-06-02 NOTE — Progress Notes (Signed)
Bilateral lower extremity venous duplex completed. Preliminary results. There is no evidence of DVT, superficial thrombosis, or Baker's cyst. 06/02/2017 2:59 PM

## 2017-06-05 DIAGNOSIS — F3181 Bipolar II disorder: Secondary | ICD-10-CM | POA: Diagnosis not present

## 2017-06-05 DIAGNOSIS — F411 Generalized anxiety disorder: Secondary | ICD-10-CM | POA: Diagnosis not present

## 2017-06-07 ENCOUNTER — Encounter: Payer: Self-pay | Admitting: Family Medicine

## 2017-06-12 DIAGNOSIS — Z0279 Encounter for issue of other medical certificate: Secondary | ICD-10-CM

## 2017-06-14 DIAGNOSIS — Z9884 Bariatric surgery status: Secondary | ICD-10-CM | POA: Diagnosis not present

## 2017-06-14 DIAGNOSIS — Z6841 Body Mass Index (BMI) 40.0 and over, adult: Secondary | ICD-10-CM | POA: Diagnosis not present

## 2017-06-14 DIAGNOSIS — Z713 Dietary counseling and surveillance: Secondary | ICD-10-CM | POA: Diagnosis not present

## 2017-06-20 ENCOUNTER — Telehealth: Payer: Self-pay

## 2017-06-20 DIAGNOSIS — F411 Generalized anxiety disorder: Secondary | ICD-10-CM | POA: Diagnosis not present

## 2017-06-20 DIAGNOSIS — F3181 Bipolar II disorder: Secondary | ICD-10-CM | POA: Diagnosis not present

## 2017-06-20 NOTE — Telephone Encounter (Signed)
Patient called states that her thyroid is high at 175 per [facility]. They can not alter dose.  Can you please prescribe a lower dose of Levothyroxine to [facility] [address].  She doesn't meet her new doctor at [facility] until the middle of June.  Please advise.  Thank you

## 2017-06-21 NOTE — Telephone Encounter (Signed)
Patient notified

## 2017-06-21 NOTE — Telephone Encounter (Signed)
No.  The ordering physician should take responsibility for the lab they ordered and adjust her medication accordingly.

## 2017-07-04 ENCOUNTER — Telehealth: Payer: Self-pay | Admitting: Neurology

## 2017-07-04 ENCOUNTER — Telehealth: Payer: Self-pay

## 2017-07-04 NOTE — Telephone Encounter (Signed)
Copied from CRM [phone]. Topic: General - Other >> Jul 04, 2017 12:00 PM Percival SpanishKennedy, Cheryl W wrote:  Pt call to request a new pt appt and was dismissed by Dr Aleen Campiysinger office  for missing to  many appts and she is wanting to know [facility] rx her carisoprodol (SOMA) 350 MG tablet. She said she takes this medicine for back pain and neuropathy and weighs 300 pounds she said she has been taking the med for years   [phone]

## 2017-07-04 NOTE — Telephone Encounter (Signed)
Pt called she has been dropped by PCP for missed appointments. The PCP prescribed soma for her and will not give her any medication. Pt requested an appt for neck pain in the hopes of getting RX for soma. An appt has been scheduled for 7/24. FYI

## 2017-07-04 NOTE — Telephone Encounter (Signed)
Spoke with patient and discussed that Dr. Lucia GaskinsAhern does not prescribe controlled meds long term and we advise pt to look for a new PCP for this and we can cancel her appointment. Pt did verbalize understanding but asked if Dr. Lucia GaskinsAhern could prescribe the medication short term only 1 month possibly 2. RN advised this is not guaranteed but she can ask MD. Pt understood.

## 2017-07-04 NOTE — Telephone Encounter (Signed)
I don;t prescribe controlled drugs long-term unfortunately she needs to find a new pcp and cancel appointment and see pcp instead thanks

## 2017-07-05 ENCOUNTER — Other Ambulatory Visit: Payer: Self-pay | Admitting: Neurology

## 2017-07-05 DIAGNOSIS — Z6841 Body Mass Index (BMI) 40.0 and over, adult: Secondary | ICD-10-CM | POA: Diagnosis not present

## 2017-07-05 DIAGNOSIS — Z713 Dietary counseling and surveillance: Secondary | ICD-10-CM | POA: Diagnosis not present

## 2017-07-05 DIAGNOSIS — Z9884 Bariatric surgery status: Secondary | ICD-10-CM | POA: Diagnosis not present

## 2017-07-05 MED ORDER — CARISOPRODOL 350 MG PO TABS: 350.0000 mg | ORAL_TABLET | Freq: Three times a day (TID) | ORAL | 0 refills | Status: DC

## 2017-07-22 ENCOUNTER — Other Ambulatory Visit: Payer: Self-pay | Admitting: Medical

## 2017-07-24 DIAGNOSIS — F3181 Bipolar II disorder: Secondary | ICD-10-CM | POA: Diagnosis not present

## 2017-07-24 DIAGNOSIS — F411 Generalized anxiety disorder: Secondary | ICD-10-CM | POA: Diagnosis not present

## 2017-07-25 ENCOUNTER — Other Ambulatory Visit: Payer: Self-pay

## 2017-07-25 ENCOUNTER — Ambulatory Visit: Payer: BLUE CROSS/BLUE SHIELD | Admitting: Family Medicine

## 2017-07-25 ENCOUNTER — Encounter: Payer: Self-pay | Admitting: Family Medicine

## 2017-07-25 VITALS — BP 120/87 | HR 121 | Temp 97.9°F | Resp 20 | Ht 66.42 in | Wt 314.8 lb

## 2017-07-25 DIAGNOSIS — F3181 Bipolar II disorder: Secondary | ICD-10-CM

## 2017-07-25 DIAGNOSIS — M5030 Other cervical disc degeneration, unspecified cervical region: Secondary | ICD-10-CM

## 2017-07-25 DIAGNOSIS — E039 Hypothyroidism, unspecified: Secondary | ICD-10-CM | POA: Diagnosis not present

## 2017-07-25 DIAGNOSIS — G894 Chronic pain syndrome: Secondary | ICD-10-CM | POA: Diagnosis not present

## 2017-07-25 DIAGNOSIS — Z5181 Encounter for therapeutic drug level monitoring: Secondary | ICD-10-CM | POA: Diagnosis not present

## 2017-07-25 DIAGNOSIS — Z6841 Body Mass Index (BMI) 40.0 and over, adult: Secondary | ICD-10-CM | POA: Diagnosis not present

## 2017-07-25 DIAGNOSIS — I10 Essential (primary) hypertension: Secondary | ICD-10-CM

## 2017-07-25 MED ORDER — CARISOPRODOL 250 MG PO TABS: ORAL_TABLET | ORAL | 0 refills | Status: AC

## 2017-07-25 MED ORDER — BACLOFEN 10 MG PO TABS: 10.0000 mg | ORAL_TABLET | Freq: Three times a day (TID) | ORAL | 0 refills | Status: DC
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
## 2017-07-25 NOTE — Patient Instructions (Signed)
     IF you received an x-ray today, you will receive an invoice from [facility]. Please contact [facility] at [phone] with questions or concerns regarding your invoice.   IF you received labwork today, you will receive an invoice from [facility]. Please contact [facility] at [phone] with questions or concerns regarding your invoice.   Our billing staff will not be able to assist you with questions regarding bills from these companies.  You will be contacted with the lab results as soon as they are available. The fastest way to get your results is to activate your My Chart account. Instructions are located on the last page of this paperwork. If you have not heard from us regarding the results in 2 weeks, please contact this office.     

## 2017-07-26 LAB — COMPREHENSIVE METABOLIC PANEL
ALT: 36 IU/L — ABNORMAL HIGH (ref 0–32)
AST: 43 IU/L — ABNORMAL HIGH (ref 0–40)
Albumin/Globulin Ratio: 1.5 (ref 1.2–2.2)
Albumin: 4.9 g/dL (ref 3.5–5.5)
Alkaline Phosphatase: 94 IU/L (ref 39–117)
BUN/Creatinine Ratio: 22 (ref 9–23)
BUN: 24 mg/dL (ref 6–24)
Bilirubin Total: 0.3 mg/dL (ref 0.0–1.2)
CO2: 22 mmol/L (ref 20–29)
Calcium: 9.8 mg/dL (ref 8.7–10.2)
Chloride: 100 mmol/L (ref 96–106)
Creatinine, Ser: 1.1 mg/dL — ABNORMAL HIGH (ref 0.57–1.00)
GFR calc Af Amer: 72 mL/min/{1.73_m2} (ref >59)
GFR calc non Af Amer: 63 mL/min/{1.73_m2} (ref >59)
Globulin, Total: 3.2 g/dL (ref 1.5–4.5)
Glucose: 101 mg/dL — ABNORMAL HIGH (ref 65–99)
Potassium: 4.4 mmol/L (ref 3.5–5.2)
Sodium: 143 mmol/L (ref 134–144)
Total Protein: 8.1 g/dL (ref 6.0–8.5)

## 2017-07-26 LAB — TSH: TSH: 107.7 u[IU]/mL — ABNORMAL HIGH (ref 0.450–4.500)

## 2017-07-27 ENCOUNTER — Encounter: Payer: Self-pay | Admitting: Family Medicine

## 2017-07-27 MED ORDER — LEVOTHYROXINE SODIUM 200 MCG PO TABS: 200.0000 ug | ORAL_TABLET | Freq: Every day | ORAL | 2 refills | Status: DC

## 2017-07-29 LAB — TOXASSURE SELECT 13 (MW), URINE

## 2017-08-08 ENCOUNTER — Ambulatory Visit: Payer: BLUE CROSS/BLUE SHIELD | Admitting: Family Medicine

## 2017-08-09 DIAGNOSIS — Z9884 Bariatric surgery status: Secondary | ICD-10-CM | POA: Diagnosis not present

## 2017-08-09 DIAGNOSIS — Z713 Dietary counseling and surveillance: Secondary | ICD-10-CM | POA: Diagnosis not present

## 2017-08-09 DIAGNOSIS — Z6841 Body Mass Index (BMI) 40.0 and over, adult: Secondary | ICD-10-CM | POA: Diagnosis not present

## 2017-08-14 DIAGNOSIS — F411 Generalized anxiety disorder: Secondary | ICD-10-CM | POA: Diagnosis not present

## 2017-08-14 DIAGNOSIS — F3181 Bipolar II disorder: Secondary | ICD-10-CM | POA: Diagnosis not present

## 2017-08-15 ENCOUNTER — Ambulatory Visit: Payer: BLUE CROSS/BLUE SHIELD | Admitting: Family Medicine

## 2017-08-16 ENCOUNTER — Ambulatory Visit: Payer: BLUE CROSS/BLUE SHIELD | Admitting: Neurology

## 2017-08-21 ENCOUNTER — Other Ambulatory Visit: Payer: Self-pay | Admitting: Family Medicine

## 2017-08-21 NOTE — Telephone Encounter (Signed)
Copied from CRM [id number]. Topic: Quick Communication - Rx Refill/Question >> Aug 21, 2017  9:34 AM Maye Hidesoley, Sarah wrote: Medication baclofen (LIORESAL) 10 MG tablet  Has the patient contacted their pharmacy? yes (Agent: If no, request that the patient contact the pharmacy for the refill.) (Agent: If yes, when and what did the pharmacy advise?)  Preferred Pharmacy (with phone number or street name): [id number] - [place], [place] - [address] [facility] [address]  Agent: Please be advised that RX refills may take up to 3 business days. We ask that you follow-up with your pharmacy.

## 2017-08-21 NOTE — Telephone Encounter (Signed)
Baclofen  LRF 07/25/17  #90  0 refills  LOV 07/25/17 Dr. Genella RifeSantiago   [facility] [id number] - [place], [place] - [address] [facility] [address] [facility]

## 2017-08-22 NOTE — Telephone Encounter (Signed)
Pt is requesting medication baclofen  Last ov 07/25/17

## 2017-08-25 ENCOUNTER — Telehealth: Payer: Self-pay | Admitting: Family Medicine

## 2017-08-28 ENCOUNTER — Ambulatory Visit: Payer: BLUE CROSS/BLUE SHIELD | Admitting: Family Medicine

## 2017-08-30 DIAGNOSIS — F3174 Bipolar disorder, in full remission, most recent episode manic: Secondary | ICD-10-CM | POA: Diagnosis not present

## 2017-08-30 DIAGNOSIS — F3176 Bipolar disorder, in full remission, most recent episode depressed: Secondary | ICD-10-CM | POA: Diagnosis not present

## 2017-08-30 DIAGNOSIS — F900 Attention-deficit hyperactivity disorder, predominantly inattentive type: Secondary | ICD-10-CM | POA: Diagnosis not present

## 2017-09-03 ENCOUNTER — Other Ambulatory Visit: Payer: Self-pay | Admitting: Medical

## 2017-09-12 ENCOUNTER — Ambulatory Visit: Payer: BLUE CROSS/BLUE SHIELD | Admitting: Family Medicine

## 2017-10-03 DIAGNOSIS — F3181 Bipolar II disorder: Secondary | ICD-10-CM | POA: Diagnosis not present

## 2017-10-03 DIAGNOSIS — F411 Generalized anxiety disorder: Secondary | ICD-10-CM | POA: Diagnosis not present

## 2017-10-10 DIAGNOSIS — F3181 Bipolar II disorder: Secondary | ICD-10-CM | POA: Diagnosis not present

## 2017-10-10 DIAGNOSIS — F411 Generalized anxiety disorder: Secondary | ICD-10-CM | POA: Diagnosis not present

## 2017-10-11 ENCOUNTER — Ambulatory Visit: Payer: BLUE CROSS/BLUE SHIELD | Admitting: Family Medicine

## 2017-10-13 ENCOUNTER — Ambulatory Visit: Payer: BLUE CROSS/BLUE SHIELD | Admitting: Family Medicine

## 2017-10-16 ENCOUNTER — Ambulatory Visit (INDEPENDENT_AMBULATORY_CARE_PROVIDER_SITE_OTHER): Payer: BLUE CROSS/BLUE SHIELD

## 2017-10-16 ENCOUNTER — Ambulatory Visit (INDEPENDENT_AMBULATORY_CARE_PROVIDER_SITE_OTHER): Payer: BLUE CROSS/BLUE SHIELD | Admitting: Family Medicine

## 2017-10-16 ENCOUNTER — Other Ambulatory Visit: Payer: Self-pay

## 2017-10-16 ENCOUNTER — Encounter: Payer: Self-pay | Admitting: Family Medicine

## 2017-10-16 VITALS — BP 134/85 | HR 100 | Temp 98.0°F | Ht 66.46 in | Wt 314.0 lb

## 2017-10-16 DIAGNOSIS — Z23 Encounter for immunization: Secondary | ICD-10-CM

## 2017-10-16 DIAGNOSIS — M25561 Pain in right knee: Secondary | ICD-10-CM | POA: Diagnosis not present

## 2017-10-16 NOTE — Patient Instructions (Addendum)
     If you have lab work done today you will be contacted with your lab results within the next 2 weeks.  If you have not heard from us then please contact us. The fastest way to get your results is to register for My Chart.   IF you received an x-ray today, you will receive an invoice from [facility]. Please contact  Radiology at [phone] with questions or concerns regarding your invoice.   IF you received labwork today, you will receive an invoice from [facility]. Please contact [facility] at [phone] with questions or concerns regarding your invoice.   Our billing staff will not be able to assist you with questions regarding bills from these companies.  You will be contacted with the lab results as soon as they are available. The fastest way to get your results is to activate your My Chart account. Instructions are located on the last page of this paperwork. If you have not heard from us regarding the results in 2 weeks, please contact this office.      Knee Pain, Adult Many things can cause knee pain. The pain often goes away on its own with time and rest. If the pain does not go away, tests may be done to find out what is causing the pain. Follow these instructions at home: Activity  Rest your knee.  Do not do things that cause pain.  Avoid activities where both feet leave the ground at the same time (high-impact activities). Examples are running, jumping rope, and doing jumping jacks. General instructions  Take medicines only as told by your doctor.  Raise (elevate) your knee when you are resting. Make sure your knee is higher than your heart.  Sleep with a pillow under your knee.  If told, put ice on the knee: ? Put ice in a plastic bag. ? Place a towel between your skin and the bag. ? Leave the ice on for 20 minutes, 2-3 times a day.  Ask your doctor if you should wear an elastic knee support.  Lose weight if you are overweight. Being  overweight can make your knee hurt more.  Do not use any tobacco products. These include cigarettes, chewing tobacco, or electronic cigarettes. If you need help quitting, ask your doctor. Smoking may slow down healing. Contact a doctor if:  The pain does not stop.  The pain changes or gets worse.  You have a fever along with knee pain.  Your knee gives out or locks up.  Your knee swells, and becomes worse. Get help right away if:  Your knee feels warm.  You cannot move your knee.  You have very bad knee pain.  You have chest pain.  You have trouble breathing. Summary  Many things can cause knee pain. The pain often goes away on its own with time and rest.  Avoid activities that put stress on your knee. These include running and jumping rope.  Get help right away if you cannot move your knee, or if your knee feels warm, or if you have trouble breathing. This information is not intended to replace advice given to you by your health care provider. Make sure you discuss any questions you have with your health care provider. Document Released: 04/08/2008 Document Revised: 01/05/2016 Document Reviewed: 01/05/2016 Elsevier Interactive Patient Education  2017 [employer]  
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
## 2017-10-23 DIAGNOSIS — Z6841 Body Mass Index (BMI) 40.0 and over, adult: Secondary | ICD-10-CM | POA: Diagnosis not present

## 2017-10-23 DIAGNOSIS — Z87891 Personal history of nicotine dependence: Secondary | ICD-10-CM | POA: Diagnosis not present

## 2017-10-23 DIAGNOSIS — Z8659 Personal history of other mental and behavioral disorders: Secondary | ICD-10-CM | POA: Diagnosis not present

## 2017-10-23 DIAGNOSIS — F419 Anxiety disorder, unspecified: Secondary | ICD-10-CM | POA: Diagnosis not present

## 2017-10-23 DIAGNOSIS — Z01818 Encounter for other preprocedural examination: Secondary | ICD-10-CM | POA: Diagnosis not present

## 2017-10-23 DIAGNOSIS — F329 Major depressive disorder, single episode, unspecified: Secondary | ICD-10-CM | POA: Diagnosis not present

## 2017-10-25 DIAGNOSIS — Z6841 Body Mass Index (BMI) 40.0 and over, adult: Secondary | ICD-10-CM | POA: Diagnosis not present

## 2017-10-25 DIAGNOSIS — Z713 Dietary counseling and surveillance: Secondary | ICD-10-CM | POA: Diagnosis not present

## 2017-10-30 DIAGNOSIS — F3181 Bipolar II disorder: Secondary | ICD-10-CM | POA: Diagnosis not present

## 2017-10-30 DIAGNOSIS — F411 Generalized anxiety disorder: Secondary | ICD-10-CM | POA: Diagnosis not present

## 2017-11-03 ENCOUNTER — Ambulatory Visit: Payer: BLUE CROSS/BLUE SHIELD | Admitting: Family Medicine

## 2017-11-06 DIAGNOSIS — F411 Generalized anxiety disorder: Secondary | ICD-10-CM | POA: Diagnosis not present

## 2017-11-06 DIAGNOSIS — F3181 Bipolar II disorder: Secondary | ICD-10-CM | POA: Diagnosis not present

## 2017-11-14 ENCOUNTER — Encounter

## 2017-11-14 ENCOUNTER — Encounter: Payer: Self-pay | Admitting: Family Medicine

## 2017-11-14 ENCOUNTER — Ambulatory Visit (INDEPENDENT_AMBULATORY_CARE_PROVIDER_SITE_OTHER): Payer: BLUE CROSS/BLUE SHIELD | Admitting: Family Medicine

## 2017-11-14 ENCOUNTER — Other Ambulatory Visit: Payer: Self-pay

## 2017-11-14 VITALS — BP 118/82 | HR 90 | Temp 98.2°F | Ht 66.46 in | Wt 314.6 lb

## 2017-11-14 DIAGNOSIS — M5030 Other cervical disc degeneration, unspecified cervical region: Secondary | ICD-10-CM | POA: Diagnosis not present

## 2017-11-14 DIAGNOSIS — Z5181 Encounter for therapeutic drug level monitoring: Secondary | ICD-10-CM | POA: Diagnosis not present

## 2017-11-14 DIAGNOSIS — R945 Abnormal results of liver function studies: Secondary | ICD-10-CM

## 2017-11-14 DIAGNOSIS — E039 Hypothyroidism, unspecified: Secondary | ICD-10-CM | POA: Diagnosis not present

## 2017-11-14 DIAGNOSIS — R7989 Other specified abnormal findings of blood chemistry: Secondary | ICD-10-CM

## 2017-11-14 DIAGNOSIS — R000 Tachycardia, unspecified: Secondary | ICD-10-CM

## 2017-11-14 MED ORDER — ATENOLOL 25 MG PO TABS: 25.0000 mg | ORAL_TABLET | Freq: Every day | ORAL | 0 refills | Status: DC

## 2017-11-14 MED ORDER — BACLOFEN 10 MG PO TABS: 10.0000 mg | ORAL_TABLET | Freq: Three times a day (TID) | ORAL | 2 refills | Status: DC
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
## 2017-11-15 LAB — COMPREHENSIVE METABOLIC PANEL
ALT: 21 IU/L (ref 0–32)
AST: 19 IU/L (ref 0–40)
Albumin/Globulin Ratio: 1.9 (ref 1.2–2.2)
Albumin: 4.3 g/dL (ref 3.5–5.5)
Alkaline Phosphatase: 84 IU/L (ref 39–117)
BUN/Creatinine Ratio: 15 (ref 9–23)
BUN: 12 mg/dL (ref 6–24)
Bilirubin Total: 0.3 mg/dL (ref 0.0–1.2)
CO2: 22 mmol/L (ref 20–29)
Calcium: 9.4 mg/dL (ref 8.7–10.2)
Chloride: 106 mmol/L (ref 96–106)
Creatinine, Ser: 0.82 mg/dL (ref 0.57–1.00)
GFR calc Af Amer: 103 mL/min/{1.73_m2} (ref >59)
GFR calc non Af Amer: 89 mL/min/{1.73_m2} (ref >59)
Globulin, Total: 2.3 g/dL (ref 1.5–4.5)
Glucose: 92 mg/dL (ref 65–99)
Potassium: 5 mmol/L (ref 3.5–5.2)
Sodium: 143 mmol/L (ref 134–144)
Total Protein: 6.6 g/dL (ref 6.0–8.5)

## 2017-11-15 LAB — TSH: TSH: 4.09 u[IU]/mL (ref 0.450–4.500)

## 2017-11-16 NOTE — Telephone Encounter (Signed)
DONE

## 2017-11-17 DIAGNOSIS — F3174 Bipolar disorder, in full remission, most recent episode manic: Secondary | ICD-10-CM | POA: Diagnosis not present

## 2017-11-17 DIAGNOSIS — F3176 Bipolar disorder, in full remission, most recent episode depressed: Secondary | ICD-10-CM | POA: Diagnosis not present

## 2017-11-17 DIAGNOSIS — F900 Attention-deficit hyperactivity disorder, predominantly inattentive type: Secondary | ICD-10-CM | POA: Diagnosis not present

## 2017-11-20 DIAGNOSIS — F3181 Bipolar II disorder: Secondary | ICD-10-CM | POA: Diagnosis not present

## 2017-11-29 DIAGNOSIS — Z6841 Body Mass Index (BMI) 40.0 and over, adult: Secondary | ICD-10-CM | POA: Diagnosis not present

## 2017-11-29 DIAGNOSIS — Z713 Dietary counseling and surveillance: Secondary | ICD-10-CM | POA: Diagnosis not present

## 2017-11-29 DIAGNOSIS — Z9884 Bariatric surgery status: Secondary | ICD-10-CM | POA: Diagnosis not present

## 2017-12-02 ENCOUNTER — Other Ambulatory Visit: Payer: Self-pay | Admitting: Family Medicine

## 2018-01-03 DIAGNOSIS — Z713 Dietary counseling and surveillance: Secondary | ICD-10-CM | POA: Diagnosis not present

## 2018-01-04 DIAGNOSIS — Z87891 Personal history of nicotine dependence: Secondary | ICD-10-CM | POA: Diagnosis not present

## 2018-01-04 DIAGNOSIS — Z8659 Personal history of other mental and behavioral disorders: Secondary | ICD-10-CM | POA: Diagnosis not present

## 2018-01-04 DIAGNOSIS — F419 Anxiety disorder, unspecified: Secondary | ICD-10-CM | POA: Diagnosis not present

## 2018-01-04 DIAGNOSIS — F329 Major depressive disorder, single episode, unspecified: Secondary | ICD-10-CM | POA: Diagnosis not present

## 2018-01-12 ENCOUNTER — Ambulatory Visit: Payer: BLUE CROSS/BLUE SHIELD | Admitting: Family Medicine

## 2018-01-26 DIAGNOSIS — G4733 Obstructive sleep apnea (adult) (pediatric): Secondary | ICD-10-CM | POA: Insufficient documentation

## 2018-01-26 HISTORY — DX: Obstructive sleep apnea (adult) (pediatric): G47.33

## 2018-01-29 ENCOUNTER — Other Ambulatory Visit: Payer: Self-pay | Admitting: Family Medicine

## 2018-02-05 NOTE — Telephone Encounter (Signed)
Patient has not heard back about refill for baclofin. It has been 5 business days and no response. Please contact patient to update her.

## 2018-02-07 ENCOUNTER — Other Ambulatory Visit: Payer: Self-pay | Admitting: Family Medicine

## 2018-02-08 DIAGNOSIS — G4733 Obstructive sleep apnea (adult) (pediatric): Secondary | ICD-10-CM | POA: Diagnosis not present

## 2018-02-08 NOTE — Telephone Encounter (Signed)
Please advise 

## 2018-02-19 DIAGNOSIS — F4323 Adjustment disorder with mixed anxiety and depressed mood: Secondary | ICD-10-CM | POA: Diagnosis not present

## 2018-02-22 DIAGNOSIS — R0683 Snoring: Secondary | ICD-10-CM | POA: Diagnosis not present

## 2018-02-23 ENCOUNTER — Encounter: Payer: Self-pay | Admitting: Family Medicine

## 2018-02-23 ENCOUNTER — Ambulatory Visit: Payer: BLUE CROSS/BLUE SHIELD | Admitting: Family Medicine

## 2018-02-23 ENCOUNTER — Other Ambulatory Visit: Payer: Self-pay

## 2018-02-23 ENCOUNTER — Telehealth: Payer: Self-pay | Admitting: Family Medicine

## 2018-02-23 VITALS — BP 128/86 | HR 99 | Temp 98.7°F | Resp 16 | Ht 67.0 in | Wt 321.2 lb

## 2018-02-23 DIAGNOSIS — R7989 Other specified abnormal findings of blood chemistry: Secondary | ICD-10-CM

## 2018-02-23 DIAGNOSIS — R945 Abnormal results of liver function studies: Secondary | ICD-10-CM | POA: Diagnosis not present

## 2018-02-23 DIAGNOSIS — E039 Hypothyroidism, unspecified: Secondary | ICD-10-CM

## 2018-02-23 DIAGNOSIS — M1A09X0 Idiopathic chronic gout, multiple sites, without tophus (tophi): Secondary | ICD-10-CM

## 2018-02-23 DIAGNOSIS — I10 Essential (primary) hypertension: Secondary | ICD-10-CM

## 2018-02-23 MED ORDER — COLCHICINE 0.6 MG PO TABS: 0.6000 mg | ORAL_TABLET | Freq: Two times a day (BID) | ORAL | 2 refills | Status: DC | PRN

## 2018-02-23 MED ORDER — TRIAMTERENE-HCTZ 37.5-25 MG PO TABS: 1.0000 | ORAL_TABLET | Freq: Every day | ORAL | 1 refills | Status: DC

## 2018-02-23 MED ORDER — COLCRYS 0.6 MG PO TABS: ORAL_TABLET | ORAL | 0 refills | Status: DC

## 2018-02-23 NOTE — Telephone Encounter (Signed)
Please advise 

## 2018-02-23 NOTE — Patient Instructions (Signed)
° ° ° °  If you have lab work done today you will be contacted with your lab results within the next 2 weeks.  If you have not heard from us then please contact us. The fastest way to get your results is to register for My Chart. ° ° °IF you received an x-ray today, you will receive an invoice from [facility]. Please contact [facility] at [phone] with questions or concerns regarding your invoice.  ° °IF you received labwork today, you will receive an invoice from [facility]. Please contact [facility] at [phone] with questions or concerns regarding your invoice.  ° °Our billing staff will not be able to assist you with questions regarding bills from these companies. ° °You will be contacted with the lab results as soon as they are available. The fastest way to get your results is to activate your My Chart account. Instructions are located on the last page of this paperwork. If you have not heard from us regarding the results in 2 weeks, please contact this office. °  ° ° ° °

## 2018-02-23 NOTE — Telephone Encounter (Signed)
Copied from CRM [id number]. Topic: General - Other >> Feb 23, 2018  3:57 PM Tamela Oddi wrote: Reason for CRM: Patient called to inform the doctor that the pharmacy told her that they cannot refill her script for COLCRYS 0.6 MG tablet according to the insurance.  Patient will need to get the generic brand of this medication in order for her insurance to cover it.  Please advise.  CB# [phone]
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
## 2018-02-26 DIAGNOSIS — F4323 Adjustment disorder with mixed anxiety and depressed mood: Secondary | ICD-10-CM | POA: Diagnosis not present

## 2018-03-01 DIAGNOSIS — F329 Major depressive disorder, single episode, unspecified: Secondary | ICD-10-CM | POA: Diagnosis not present

## 2018-03-01 DIAGNOSIS — F419 Anxiety disorder, unspecified: Secondary | ICD-10-CM | POA: Diagnosis not present

## 2018-03-01 DIAGNOSIS — Z8659 Personal history of other mental and behavioral disorders: Secondary | ICD-10-CM | POA: Diagnosis not present

## 2018-03-01 DIAGNOSIS — Z87891 Personal history of nicotine dependence: Secondary | ICD-10-CM | POA: Diagnosis not present

## 2018-03-05 DIAGNOSIS — F4323 Adjustment disorder with mixed anxiety and depressed mood: Secondary | ICD-10-CM | POA: Diagnosis not present

## 2018-03-12 DIAGNOSIS — F4323 Adjustment disorder with mixed anxiety and depressed mood: Secondary | ICD-10-CM | POA: Diagnosis not present

## 2018-03-19 DIAGNOSIS — F4323 Adjustment disorder with mixed anxiety and depressed mood: Secondary | ICD-10-CM | POA: Diagnosis not present

## 2018-03-26 DIAGNOSIS — F4323 Adjustment disorder with mixed anxiety and depressed mood: Secondary | ICD-10-CM | POA: Diagnosis not present

## 2018-04-02 DIAGNOSIS — F4323 Adjustment disorder with mixed anxiety and depressed mood: Secondary | ICD-10-CM | POA: Diagnosis not present

## 2018-05-23 ENCOUNTER — Other Ambulatory Visit: Payer: Self-pay | Admitting: Family Medicine

## 2018-05-23 DIAGNOSIS — F3176 Bipolar disorder, in full remission, most recent episode depressed: Secondary | ICD-10-CM | POA: Diagnosis not present

## 2018-05-23 DIAGNOSIS — F900 Attention-deficit hyperactivity disorder, predominantly inattentive type: Secondary | ICD-10-CM | POA: Diagnosis not present

## 2018-05-23 DIAGNOSIS — F3174 Bipolar disorder, in full remission, most recent episode manic: Secondary | ICD-10-CM | POA: Diagnosis not present

## 2018-05-28 DIAGNOSIS — F4323 Adjustment disorder with mixed anxiety and depressed mood: Secondary | ICD-10-CM | POA: Diagnosis not present

## 2018-06-04 DIAGNOSIS — F4323 Adjustment disorder with mixed anxiety and depressed mood: Secondary | ICD-10-CM | POA: Diagnosis not present

## 2018-06-25 ENCOUNTER — Telehealth: Payer: Self-pay | Admitting: Family Medicine

## 2018-06-25 ENCOUNTER — Other Ambulatory Visit: Payer: Self-pay | Admitting: Medical

## 2018-06-25 DIAGNOSIS — F4323 Adjustment disorder with mixed anxiety and depressed mood: Secondary | ICD-10-CM | POA: Diagnosis not present

## 2018-06-25 NOTE — Telephone Encounter (Signed)
Copied from CRM [id number]. Topic: Quick Communication - Rx Refill/Question >> Jun 25, 2018 12:03 PM Saverio Danker J wrote: Medication: fursomide   Has the patient contacted their pharmacy? Yes.   (Agent: If no, request that the patient contact the pharmacy for the refill.) (Agent: If yes, when and what did the pharmacy advise?)  Preferred Pharmacy (with phone number or street name): [facility]  Cb is [phone]   Agent: Please be advised that RX refills may take up to 3 business days. We ask that you follow-up with your pharmacy.

## 2018-06-25 NOTE — Telephone Encounter (Signed)
Copied from CRM [id number]. Topic: General - Other >> Jun 25, 2018 12:06 PM Jay Schlichter wrote: CRM for notification. See Telephone encounter for: 06/25/18. Pt is planning on weight loss surgery, and has a question about a nicotine screening.  Please call [phone] Reason for CRM:

## 2018-06-25 NOTE — Telephone Encounter (Signed)
Pt is asking for a medication that is not on the med list

## 2018-06-27 MED ORDER — FUROSEMIDE 20 MG PO TABS: 20.0000 mg | ORAL_TABLET | Freq: Every day | ORAL | 0 refills | Status: DC | PRN

## 2018-07-05 ENCOUNTER — Other Ambulatory Visit: Payer: Self-pay | Admitting: Family Medicine

## 2018-07-16 ENCOUNTER — Encounter: Payer: BLUE CROSS/BLUE SHIELD | Admitting: Family Medicine

## 2018-07-17 ENCOUNTER — Telehealth: Payer: Self-pay | Admitting: Family Medicine

## 2018-07-17 NOTE — Telephone Encounter (Signed)
colchicine 0.6 MG tablet [[id number]]    Pharmacy would like generic called in for insurance. Please advise

## 2018-07-17 NOTE — Telephone Encounter (Signed)
APPNT HAS BEEN CANCELLED   Copied from [place] [id number]. Topic: General - Other >> Jul 17, 2018  2:20 PM Leward Quan A wrote: Reason for CRM: Patient called need to have her appointment cancelled. Also asking Dr Pamella Pert to send a new Rx for colchicine 0.6 MG tablet to her pharmacy and do not ask for brand name only. Insurance will pay but not for the brand name. Ph# [phone]

## 2018-07-19 ENCOUNTER — Other Ambulatory Visit: Payer: Self-pay | Admitting: Family Medicine

## 2018-07-19 ENCOUNTER — Encounter: Payer: BLUE CROSS/BLUE SHIELD | Admitting: Family Medicine

## 2018-07-19 MED ORDER — COLCHICINE 0.6 MG PO TABS: 0.6000 mg | ORAL_TABLET | Freq: Two times a day (BID) | ORAL | 2 refills | Status: DC | PRN

## 2018-07-19 NOTE — Telephone Encounter (Signed)
refilled 

## 2018-07-19 NOTE — Telephone Encounter (Signed)
Dr Pamella Pert, I was about to deny this request but Something is telling to check with you first. This patient have not been seen since Jan. Had an appt with you but called and cancelled appt but still need this med refill. This med (colchine 0.6 mg) was last refill in Jan as well #30 with 2 refill

## 2018-07-23 ENCOUNTER — Other Ambulatory Visit: Payer: Self-pay | Admitting: Family Medicine

## 2018-07-26 DIAGNOSIS — F419 Anxiety disorder, unspecified: Secondary | ICD-10-CM | POA: Diagnosis not present

## 2018-07-26 DIAGNOSIS — Z01818 Encounter for other preprocedural examination: Secondary | ICD-10-CM | POA: Diagnosis not present

## 2018-07-26 DIAGNOSIS — Z8659 Personal history of other mental and behavioral disorders: Secondary | ICD-10-CM | POA: Diagnosis not present

## 2018-07-26 DIAGNOSIS — F329 Major depressive disorder, single episode, unspecified: Secondary | ICD-10-CM | POA: Diagnosis not present

## 2018-07-26 DIAGNOSIS — Z87891 Personal history of nicotine dependence: Secondary | ICD-10-CM | POA: Diagnosis not present

## 2018-08-14 ENCOUNTER — Other Ambulatory Visit: Payer: Self-pay | Admitting: Family Medicine

## 2018-08-14 NOTE — Telephone Encounter (Signed)
Requested medications are due for refill today?  Yes  Requested medications are on the active medication list?  Yes  Last refill 05/24/2018  Future visit scheduled?  No  Notes to clinic  Requested Prescriptions  Pending Prescriptions Disp Refills   baclofen (LIORESAL) 10 MG tablet [Pharmacy Med Name: BACLOFEN 10MG  TABLETS] 90 tablet 2    Sig: TAKE 1 TABLET(10 MG) BY MOUTH THREE TIMES DAILY     Not Delegated - Analgesics:  Muscle Relaxants Failed - 08/14/2018 12:51 PM      Failed - This refill cannot be delegated      Passed - Valid encounter within last 6 months    Recent Outpatient Visits          5 months ago Hypothyroidism, unspecified type   Primary Care at [facility], Lilia Argue, MD   9 months ago DDD (degenerative disc disease), cervical   [facility] at [facility], Lilia Argue, MD   10 months ago Acute pain of right knee   [facility] at [facility], Lilia Argue, MD   1 year ago Chronic pain syndrome   [facility] Care at [facility], Lilia Argue, MD

## 2018-09-06 ENCOUNTER — Other Ambulatory Visit: Payer: Self-pay | Admitting: Family Medicine

## 2018-09-07 NOTE — Telephone Encounter (Signed)
Requested Prescriptions  Pending Prescriptions Disp Refills  . atenolol (TENORMIN) 25 MG tablet [Pharmacy Med Name: ATENOLOL 25MG  TABLETS] 30 tablet 0    Sig: TAKE 1 TABLET(25 MG) BY MOUTH DAILY     Cardiovascular:  Beta Blockers Failed - 09/06/2018  6:53 PM      Failed - Valid encounter within last 6 months    Recent Outpatient Visits          6 months ago Hypothyroidism, unspecified type   Primary Care at [facility], Lilia Argue, MD   9 months ago DDD (degenerative disc disease), cervical   [facility] at [facility], Lilia Argue, MD   10 months ago Acute pain of right knee   [facility] at [facility], Lilia Argue, MD   1 year ago Chronic pain syndrome   [facility] at [facility], Lilia Argue, MD             Passed - Last BP in normal range    BP Readings from Last 1 Encounters:  02/23/18 128/86         Passed - Last Heart Rate in normal range    Pulse Readings from Last 1 Encounters:  02/23/18 99         Left voicemail for patient, notifying her that she is due for an appointment.  30 day courtesy refill sent.

## 2018-09-19 ENCOUNTER — Other Ambulatory Visit: Payer: Self-pay | Admitting: Family Medicine

## 2018-09-19 NOTE — Telephone Encounter (Signed)
Requested medication (s) are due for refill today: yes  Requested medication (s) are on the active medication list: yes  Last refill:  07/23/2018  Future visit scheduled: no  Notes to clinic:  Review for refill Last office visit 02/23/2018   Requested Prescriptions  Pending Prescriptions Disp Refills   furosemide (LASIX) 20 MG tablet [Pharmacy Med Name: FUROSEMIDE 20MG  TABLETS] 30 tablet 0    Sig: TAKE 1 TABLET(20 MG) BY MOUTH DAILY AS NEEDED FOR SWELLING. DO NOT TAKE WITH MAXZIDE     Cardiovascular:  Diuretics - Loop Failed - 09/19/2018 11:18 AM      Failed - Valid encounter within last 6 months    Recent Outpatient Visits          6 months ago Hypothyroidism, unspecified type   [facility] at [facility], Lilia Argue, MD   10 months ago DDD (degenerative disc disease), cervical   [facility], Lilia Argue, MD   11 months ago Acute pain of right knee   [facility] at [facility], Lilia Argue, MD   1 year ago Chronic pain syndrome   [facility] at [facility], Lilia Argue, MD             Passed - K in normal range and within 360 days    Potassium  Date Value Ref Range Status  11/14/2017 5.0 3.5 - 5.2 mmol/L Final         Passed - Ca in normal range and within 360 days    Calcium  Date Value Ref Range Status  11/14/2017 9.4 8.7 - 10.2 mg/dL Final         Passed - Na in normal range and within 360 days    Sodium  Date Value Ref Range Status  11/14/2017 143 134 - 144 mmol/L Final         Passed - Cr in normal range and within 360 days    Creat  Date Value Ref Range Status  08/02/2016 0.73 0.50 - 1.10 mg/dL Final   Creatinine, Ser  Date Value Ref Range Status  11/14/2017 0.82 0.57 - 1.00 mg/dL Final         Passed - Last BP in normal range    BP Readings from Last 1 Encounters:  02/23/18 128/86

## 2018-09-24 DIAGNOSIS — Z713 Dietary counseling and surveillance: Secondary | ICD-10-CM | POA: Diagnosis not present

## 2018-10-04 DIAGNOSIS — Z01818 Encounter for other preprocedural examination: Secondary | ICD-10-CM | POA: Diagnosis not present

## 2018-10-10 DIAGNOSIS — Z6841 Body Mass Index (BMI) 40.0 and over, adult: Secondary | ICD-10-CM | POA: Diagnosis not present

## 2018-10-10 DIAGNOSIS — Z01812 Encounter for preprocedural laboratory examination: Secondary | ICD-10-CM | POA: Diagnosis not present

## 2018-10-10 DIAGNOSIS — Z20828 Contact with and (suspected) exposure to other viral communicable diseases: Secondary | ICD-10-CM | POA: Diagnosis not present

## 2018-10-10 DIAGNOSIS — R000 Tachycardia, unspecified: Secondary | ICD-10-CM | POA: Diagnosis not present

## 2018-10-10 DIAGNOSIS — I10 Essential (primary) hypertension: Secondary | ICD-10-CM | POA: Diagnosis not present

## 2018-10-10 DIAGNOSIS — I493 Ventricular premature depolarization: Secondary | ICD-10-CM | POA: Diagnosis not present

## 2018-10-10 DIAGNOSIS — I441 Atrioventricular block, second degree: Secondary | ICD-10-CM | POA: Diagnosis not present

## 2018-10-10 DIAGNOSIS — Z01818 Encounter for other preprocedural examination: Secondary | ICD-10-CM | POA: Diagnosis not present

## 2018-10-12 ENCOUNTER — Telehealth: Payer: Self-pay | Admitting: Family Medicine

## 2018-10-12 NOTE — Telephone Encounter (Signed)
records

## 2018-10-12 NOTE — Telephone Encounter (Signed)
Gina advise please on this matter. Dgaddy, CMA

## 2018-10-15 DIAGNOSIS — K2950 Unspecified chronic gastritis without bleeding: Secondary | ICD-10-CM | POA: Diagnosis not present

## 2018-10-15 DIAGNOSIS — Z87891 Personal history of nicotine dependence: Secondary | ICD-10-CM | POA: Diagnosis not present

## 2018-10-15 DIAGNOSIS — Z6841 Body Mass Index (BMI) 40.0 and over, adult: Secondary | ICD-10-CM | POA: Diagnosis not present

## 2018-10-15 DIAGNOSIS — F3181 Bipolar II disorder: Secondary | ICD-10-CM | POA: Diagnosis not present

## 2018-10-15 DIAGNOSIS — E039 Hypothyroidism, unspecified: Secondary | ICD-10-CM | POA: Diagnosis not present

## 2018-10-15 DIAGNOSIS — G4733 Obstructive sleep apnea (adult) (pediatric): Secondary | ICD-10-CM | POA: Diagnosis not present

## 2018-10-15 DIAGNOSIS — I10 Essential (primary) hypertension: Secondary | ICD-10-CM | POA: Diagnosis not present

## 2018-10-17 NOTE — Telephone Encounter (Signed)
I spoke with pt-she just wanted Dr. Pamella Pert to know, she had bariatric surgery on 10/15/18 at [facility] by Dr. Sharyn Lull. FYI

## 2018-10-20 ENCOUNTER — Other Ambulatory Visit: Payer: Self-pay | Admitting: Family Medicine

## 2018-10-29 ENCOUNTER — Ambulatory Visit: Payer: BC Managed Care – PPO | Admitting: Family Medicine

## 2018-11-16 DIAGNOSIS — F3176 Bipolar disorder, in full remission, most recent episode depressed: Secondary | ICD-10-CM | POA: Diagnosis not present

## 2018-11-16 DIAGNOSIS — F3174 Bipolar disorder, in full remission, most recent episode manic: Secondary | ICD-10-CM | POA: Diagnosis not present

## 2018-11-16 DIAGNOSIS — F900 Attention-deficit hyperactivity disorder, predominantly inattentive type: Secondary | ICD-10-CM | POA: Diagnosis not present

## 2018-11-22 DIAGNOSIS — Z9884 Bariatric surgery status: Secondary | ICD-10-CM | POA: Diagnosis not present

## 2018-11-22 DIAGNOSIS — Z713 Dietary counseling and surveillance: Secondary | ICD-10-CM | POA: Diagnosis not present

## 2019-01-01 ENCOUNTER — Encounter: Payer: Self-pay | Admitting: Family Medicine

## 2019-01-01 ENCOUNTER — Ambulatory Visit (INDEPENDENT_AMBULATORY_CARE_PROVIDER_SITE_OTHER): Payer: BC Managed Care – PPO | Admitting: Family Medicine

## 2019-01-01 ENCOUNTER — Other Ambulatory Visit: Payer: Self-pay

## 2019-01-01 VITALS — BP 124/86 | HR 70 | Temp 97.1°F | Ht 67.0 in | Wt 274.4 lb

## 2019-01-01 DIAGNOSIS — I10 Essential (primary) hypertension: Secondary | ICD-10-CM | POA: Diagnosis not present

## 2019-01-01 DIAGNOSIS — M5030 Other cervical disc degeneration, unspecified cervical region: Secondary | ICD-10-CM | POA: Diagnosis not present

## 2019-01-01 DIAGNOSIS — K602 Anal fissure, unspecified: Secondary | ICD-10-CM | POA: Diagnosis not present

## 2019-01-01 MED ORDER — BACLOFEN 10 MG PO TABS: ORAL_TABLET | ORAL | 2 refills | Status: DC

## 2019-01-01 MED ORDER — ATENOLOL 25 MG PO TABS: ORAL_TABLET | ORAL | 0 refills | Status: DC
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
## 2019-01-01 NOTE — Patient Instructions (Addendum)
If you have lab work done today you will be contacted with your lab results within the next 2 weeks.  If you have not heard from [place] then please contact us. The fastest way to get your results is to register for My Chart.   IF you received an x-ray today, you will receive an invoice from [facility]. Please contact [facility] at [phone] with questions or concerns regarding your invoice.   IF you received labwork today, you will receive an invoice from [facility]. Please contact [facility] at [phone] with questions or concerns regarding your invoice.   Our billing staff will not be able to assist you with questions regarding bills from these companies.  You will be contacted with the lab results as soon as they are available. The fastest way to get your results is to activate your My Chart account. Instructions are located on the last page of this paperwork. If you have not heard from [place] regarding the results in 2 weeks, please contact this office.     Anal Fissure, Adult  An anal fissure is a small tear or crack in the tissue of the anus. Bleeding from a fissure usually stops on its own within a few minutes. However, bleeding will often occur again with each bowel movement until the fissure heals. What are the causes? This condition is usually caused by passing a large or hard stool (feces). Other causes include:  Constipation.  Frequent diarrhea.  Inflammatory bowel disease (Crohn's disease or ulcerative colitis).  Childbirth.  Infections.  Anal sex. What are the signs or symptoms? Symptoms of this condition include:  Bleeding from the rectum.  Small amounts of blood seen on your stool, on the toilet paper, or in the toilet after a bowel movement. The blood coats the outside of the stool and is not mixed with the stool.  Painful bowel movements.  Itching or irritation around the anus. How is this diagnosed? A health care provider may diagnose  this condition by closely examining the anal area. An anal fissure can usually be seen with careful inspection. In some cases, a rectal exam may be performed, or a short tube (anoscope) may be used to examine the anal canal. How is this treated? Initial treatment for this condition may include:  Taking steps to avoid constipation. This may include making changes to your diet, such as increasing your intake of fiber or fluid.  Taking fiber supplements. These supplements can soften your stool to help make bowel movements easier. Your health care provider may also prescribe a stool softener if your stool is hard.  Taking sitz baths. This may help to heal the tear.  Using medicated creams or ointments. These may be prescribed to lessen discomfort. Treatments that are sometimes used if initial treatments do not work well or if the condition is more severe may include:  Botulinum injection.  Surgery to repair the fissure. Follow these instructions at home: Eating and drinking   Avoid foods that may cause constipation, such as bananas, milk, and other dairy products.  Eat all fruits, except bananas.  Drink enough fluid to keep your urine pale yellow.  Eat foods that are high in fiber, such as beans, whole grains, and fresh fruits and vegetables. General instructions   Take over-the-counter and prescription medicines only as told by your health care provider.  Use creams or ointments only as told by your health care provider.  Keep the anal area clean and dry.  Take  sitz baths as told by your health care provider. Do not use soap in the sitz baths.  Keep all follow-up visits as told by your health care provider. This is important. Contact a health care provider if you have:  More bleeding.  A fever.  Diarrhea that is mixed with blood.  Pain that continues.  Ongoing problems that are getting worse rather than better. Summary  An anal fissure is a small tear or crack in the  tissue of the anus. This condition is usually caused by passing a large or hard stool (feces). Other causes include constipation and frequent diarrhea.  Initial treatment for this condition may include taking steps to avoid constipation, such as increasing your intake of fiber or fluid.  Follow instructions for care as told by your health care provider.  Contact your health care provider if you have more bleeding or your problem is getting worse rather than better.  Keep all follow-up visits as told by your health care provider. This is important. This information is not intended to replace advice given to you by your health care provider. Make sure you discuss any questions you have with your health care provider. Document Released: 01/10/2005 Document Revised: 06/22/2017 Document Reviewed: 06/22/2017 Elsevier Patient Education  2020 [employer].

## 2019-03-01 ENCOUNTER — Ambulatory Visit: Payer: BC Managed Care – PPO | Admitting: Family Medicine

## 2019-03-05 ENCOUNTER — Ambulatory Visit: Payer: BC Managed Care – PPO | Admitting: Family Medicine

## 2019-04-03 ENCOUNTER — Ambulatory Visit (INDEPENDENT_AMBULATORY_CARE_PROVIDER_SITE_OTHER): Payer: BC Managed Care – PPO | Admitting: Adult Health Nurse Practitioner

## 2019-04-03 ENCOUNTER — Other Ambulatory Visit: Payer: Self-pay

## 2019-04-03 ENCOUNTER — Encounter: Payer: Self-pay | Admitting: Adult Health Nurse Practitioner

## 2019-04-03 VITALS — BP 123/85 | HR 107 | Temp 97.3°F | Ht 67.0 in | Wt 252.0 lb

## 2019-04-03 DIAGNOSIS — R600 Localized edema: Secondary | ICD-10-CM

## 2019-04-03 NOTE — Patient Instructions (Signed)
° ° ° °  If you have lab work done today you will be contacted with your lab results within the next 2 weeks.  If you have not heard from us then please contact us. The fastest way to get your results is to register for My Chart. ° ° °IF you received an x-ray today, you will receive an invoice from [facility]. Please contact [facility] at [phone] with questions or concerns regarding your invoice.  ° °IF you received labwork today, you will receive an invoice from [facility]. Please contact [facility] at [phone] with questions or concerns regarding your invoice.  ° °Our billing staff will not be able to assist you with questions regarding bills from these companies. ° °You will be contacted with the lab results as soon as they are available. The fastest way to get your results is to activate your My Chart account. Instructions are located on the last page of this paperwork. If you have not heard from us regarding the results in 2 weeks, please contact this office. °  ° ° ° °

## 2019-04-03 NOTE — Progress Notes (Signed)
04/03/2019  Renee [name] 05-13-1975 [id number]   Subjective:    Renee [name] is a 43 y.o. female who presents for evaluation of edema in both ankles and feet. The edema has been moderate. Onset of symptoms was a few days ago, and patient reports symptoms have rapidly worsened since that time. The edema is present all day. The patient states the problem is long-standing. The swelling has been aggravated by dependency of involved area and increased salt intake. The swelling has been relieved by support stockings. Associated factors include: increased salt intake. Cardiac risk factors: diabetes mellitus, obesity (BMI >= 30 kg/m2) and smoking/ tobacco exposure.  The following portions of the patient's history were reviewed and updated as appropriate: She  has a past medical history of ADD (attention deficit disorder with hyperactivity), Allergy, Anemia, Anxiety and depression, Bipolar disorder (), Chronic nausea, Chronic pain, DDD (degenerative disc disease), cervical (02/24/2015), Dermatitis, Edema, Fibromyalgia, GERD (gastroesophageal reflux disease), Hearing difficulty, Herpes simplex, Hypertension, Hypothyroidism, Migraines, Polyarthralgia, Skin abnormalities, and Urinary incontinence. She does not have any pertinent problems on file. She  has a past surgical history that includes Gastric bypass (2008); Knee surgery; Eye surgery; Open reduction internal fixation (orif) distal radial fracture (Left, 11/30/2012); Tonsillectomy; and Colonoscopy with propofol (N/A, 09/05/2013). Current Outpatient Medications on File Prior to Visit  Medication Sig Dispense Refill  . amphetamine-dextroamphetamine (ADDERALL) 30 MG tablet Take by mouth.    Marland Kitchen atenolol (TENORMIN) 25 MG tablet TAKE 1 TABLET(25 MG) BY MOUTH DAILY 90 tablet 0  . baclofen (LIORESAL) 10 MG tablet TAKE 1 TABLET(10 MG) BY MOUTH THREE TIMES DAILY 90 tablet 2  . Cariprazine HCl (VRAYLAR) 6 MG CAPS Take by mouth.    . cetirizine (ZYRTEC) 10 MG  tablet Take 1 tablet (10 mg total) by mouth at bedtime. 90 tablet 3  . furosemide (LASIX) 20 MG tablet TAKE 1 TABLET(20 MG) BY MOUTH DAILY AS NEEDED FOR SWELLING. DO NOT TAKE WITH MAXZIDE 10 tablet 0  . lamoTRIgine (LAMICTAL) 200 MG tablet Take by mouth.    . levothyroxine (SYNTHROID, LEVOTHROID) 200 MCG tablet TAKE 1 TABLET(200 MCG) BY MOUTH DAILY BEFORE BREAKFAST 90 tablet 3  . Multiple Vitamin (MULTI-VITAMIN) tablet Take by mouth.    Marland Kitchen omeprazole (PRILOSEC) 20 MG capsule     . ondansetron (ZOFRAN-ODT) 8 MG disintegrating tablet Take 1 tablet (8 mg total) by mouth 3 (three) times daily as needed. 30 tablet 2  . potassium chloride SA (KLOR-CON) 20 MEQ tablet Take by mouth.    . triamterene-hydrochlorothiazide (MAXZIDE-25) 37.5-25 MG tablet Take 1 tablet by mouth daily. 90 tablet 1   No current facility-administered medications on file prior to visit.   She is allergic to morphine and related; penicillins; diphenhydramine; latex; synthroid [levothyroxine]; and allopurinol..  Review of Systems Positive for smoking, positive for lower extremity edema, bilateral positive for increased activity positive for weight loss, negative for chest pain, pressure, shortness of breath.   Objective:    BP 123/85   Pulse (!) 107   Temp (!) 97.3 F (36.3 C) (Temporal)   Ht 5\' 7"  (1.702 m)   Wt 252 lb (114.3 kg)   LMP 03/16/2019   SpO2 98%   BMI 39.47 kg/m  General appearance: alert and cooperative Lungs: clear to auscultation bilaterally Heart: regular rate and rhythm, S1, S2 normal, no murmur, click, rub or gallop Extremities: bilateral pitting edema +3 in left, +2 in right Skin: skin is pale, cooler than lower extremities but not cold  Assessment:     Edema secondary to increased Na intake  Plan:    Recommendations: decrease sodium in the diet, elevate feet above the level of the heart whenever possible, increase physical activity and use of compression stockings. The patient was also  instructed to call IMMEDIATELY (i.e., day or night) if any cardiopulmonary symptoms occur, especially chest pain, shortness of breath, dyspnea on exertion, paroxysmal nocturnal dyspnea, or orthopnea, and these were explained. Follow up in 2 weeks and as needed.     We reviewed her smoking.  She is probably smoking about a pack every 3 days.  The does contribute to edema, also the difference in temperature in her lower extremities.  I have advised her to wear her compression stockings all the time especially since she has increased her activity to her to her mother being in the hospital.  Would like her to follow-up with Dr. Leretha Pol.  She may have some element of peripheral vascular disease particularly given the tobacco abuse.  She is in line with this plan.  Elyse Jarvis, NP

## 2019-04-03 NOTE — Addendum Note (Signed)
Addended by: Lisbeth Renshaw, Elgar Scoggins HUA on: 04/03/2019 02:32 PM   Modules accepted: Orders

## 2019-04-03 NOTE — Addendum Note (Signed)
Addended by: Janne Lab on: 04/03/2019 02:37 PM   Modules accepted: Orders

## 2019-04-05 ENCOUNTER — Ambulatory Visit: Payer: BC Managed Care – PPO

## 2019-04-08 DIAGNOSIS — F3176 Bipolar disorder, in full remission, most recent episode depressed: Secondary | ICD-10-CM | POA: Diagnosis not present

## 2019-04-08 DIAGNOSIS — F900 Attention-deficit hyperactivity disorder, predominantly inattentive type: Secondary | ICD-10-CM | POA: Diagnosis not present

## 2019-04-08 DIAGNOSIS — F3174 Bipolar disorder, in full remission, most recent episode manic: Secondary | ICD-10-CM | POA: Diagnosis not present

## 2019-04-15 ENCOUNTER — Telehealth: Payer: Self-pay | Admitting: Adult Health Nurse Practitioner

## 2019-04-15 ENCOUNTER — Other Ambulatory Visit: Payer: Self-pay | Admitting: Family Medicine

## 2019-04-15 NOTE — Telephone Encounter (Signed)
What is the name of the medication? furosemide (LASIX) 20 MG tablet [[id number]]    Have you contacted your pharmacy to request a refill? no  Which pharmacy would you like this sent to? Pharmacy  [facility] [phone] [place], [place] - [address] AT [facility] [address]  [address] [place], [place] [address]  Phone:  [phone] Fax:  [phone]    Pt saw Colette Ribas on 04/03/19. She is saying she was suppose to get this script two weeks ago. She also says it should be doubled up.    Patient notified that their request is being sent to the clinical staff for review and that they should receive a call once it is complete. If they do not receive a call within 72 hours they can check with their pharmacy or our office.

## 2019-04-15 NOTE — Telephone Encounter (Signed)
Patient has appointment 04/18/19- refilled per protocol

## 2019-04-15 NOTE — Telephone Encounter (Signed)
Called patient to verify furosemide prescription question. Pt states is doubling up her current furosemide dosage, she is not able to verify the dosage she is taking because she doesn't have her bottles with her, patient states has had no negative side effects with current dosage. Patient is requesting an additional refill or further instructions as applicable. Pt will come in 04/16/19 for labs , please advice.

## 2019-04-18 ENCOUNTER — Other Ambulatory Visit: Payer: Self-pay | Admitting: Family Medicine

## 2019-04-18 ENCOUNTER — Ambulatory Visit: Payer: BC Managed Care – PPO | Admitting: Family Medicine

## 2019-04-18 NOTE — Telephone Encounter (Signed)
Requested Prescriptions  Pending Prescriptions Disp Refills  . triamterene-hydrochlorothiazide (MAXZIDE-25) 37.5-25 MG tablet [Pharmacy Med Name: TRIAMTERENE 37.5MG / HCTZ 25MG  TABS] 90 tablet 1    Sig: TAKE 1 TABLET BY MOUTH DAILY     Cardiovascular: Diuretic Combos Failed - 04/18/2019 10:30 AM      Failed - K in normal range and within 360 days    Potassium  Date Value Ref Range Status  11/14/2017 5.0 3.5 - 5.2 mmol/L Final         Failed - Na in normal range and within 360 days    Sodium  Date Value Ref Range Status  11/14/2017 143 134 - 144 mmol/L Final         Failed - Cr in normal range and within 360 days    Creat  Date Value Ref Range Status  08/02/2016 0.73 0.50 - 1.10 mg/dL Final   Creatinine, Ser  Date Value Ref Range Status  11/14/2017 0.82 0.57 - 1.00 mg/dL Final         Failed - Ca in normal range and within 360 days    Calcium  Date Value Ref Range Status  11/14/2017 9.4 8.7 - 10.2 mg/dL Final   Calcium, Ion  Date Value Ref Range Status  11/30/2012 1.16 1.12 - 1.23 mmol/L Final         Passed - Last BP in normal range    BP Readings from Last 1 Encounters:  04/03/19 123/85         Passed - Valid encounter within last 6 months    Recent Outpatient Visits          2 weeks ago Localized edema   [facility] at [facility], [facility], NP   3 months ago Anal fissure   [facility], Oneita Jolly, MD   1 year ago Hypothyroidism, unspecified type   [facility], Oneita Jolly, MD   1 year ago DDD (degenerative disc disease), cervical   [facility], Oneita Jolly, MD   1 year ago Acute pain of right knee   [facility], Oneita Jolly, MD      Future Appointments            In 1 week [facility], MD [facility], [facility]

## 2019-04-18 NOTE — Telephone Encounter (Signed)
Please inform patient that she needs to come in for labs prior to refill. thanks

## 2019-04-19 NOTE — Telephone Encounter (Signed)
Pt has been scheduled for monday °

## 2019-04-22 ENCOUNTER — Other Ambulatory Visit: Payer: Self-pay

## 2019-04-22 ENCOUNTER — Ambulatory Visit (INDEPENDENT_AMBULATORY_CARE_PROVIDER_SITE_OTHER): Payer: BC Managed Care – PPO | Admitting: Family Medicine

## 2019-04-22 ENCOUNTER — Ambulatory Visit: Payer: BC Managed Care – PPO | Admitting: Family Medicine

## 2019-04-22 VITALS — BP 122/83 | HR 98 | Temp 97.2°F | Ht 67.0 in | Wt 246.6 lb

## 2019-04-22 DIAGNOSIS — N9089 Other specified noninflammatory disorders of vulva and perineum: Secondary | ICD-10-CM | POA: Diagnosis not present

## 2019-04-22 DIAGNOSIS — E039 Hypothyroidism, unspecified: Secondary | ICD-10-CM

## 2019-04-22 DIAGNOSIS — F411 Generalized anxiety disorder: Secondary | ICD-10-CM | POA: Diagnosis not present

## 2019-04-22 DIAGNOSIS — R600 Localized edema: Secondary | ICD-10-CM

## 2019-04-22 DIAGNOSIS — R790 Abnormal level of blood mineral: Secondary | ICD-10-CM

## 2019-04-22 DIAGNOSIS — Z9884 Bariatric surgery status: Secondary | ICD-10-CM

## 2019-04-22 DIAGNOSIS — E559 Vitamin D deficiency, unspecified: Secondary | ICD-10-CM

## 2019-04-22 MED ORDER — HYDROXYZINE HCL 25 MG PO TABS: 25.0000 mg | ORAL_TABLET | Freq: Two times a day (BID) | ORAL | 2 refills | Status: DC | PRN

## 2019-04-22 MED ORDER — CHLORTHALIDONE 25 MG PO TABS: 25.0000 mg | ORAL_TABLET | Freq: Every day | ORAL | 1 refills | Status: DC
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
## 2019-04-22 NOTE — Patient Instructions (Signed)
° ° ° °  If you have lab work done today you will be contacted with your lab results within the next 2 weeks.  If you have not heard from us then please contact us. The fastest way to get your results is to register for My Chart. ° ° °IF you received an x-ray today, you will receive an invoice from [facility]. Please contact [facility] at [phone] with questions or concerns regarding your invoice.  ° °IF you received labwork today, you will receive an invoice from [facility]. Please contact [facility] at [phone] with questions or concerns regarding your invoice.  ° °Our billing staff will not be able to assist you with questions regarding bills from these companies. ° °You will be contacted with the lab results as soon as they are available. The fastest way to get your results is to activate your My Chart account. Instructions are located on the last page of this paperwork. If you have not heard from us regarding the results in 2 weeks, please contact this office. °  ° ° ° °

## 2019-04-23 ENCOUNTER — Encounter: Payer: Self-pay | Admitting: Family Medicine

## 2019-04-23 LAB — VITAMIN B12: Vitamin B-12: 729 pg/mL (ref 232–1245)

## 2019-04-23 LAB — CBC
Hematocrit: 42.8 % (ref 34.0–46.6)
Hemoglobin: 13.7 g/dL (ref 11.1–15.9)
MCH: 29.8 pg (ref 26.6–33.0)
MCHC: 32 g/dL (ref 31.5–35.7)
MCV: 93 fL (ref 79–97)
Platelets: 327 10*3/uL (ref 150–450)
RBC: 4.59 x10E6/uL (ref 3.77–5.28)
RDW: 16.9 % — ABNORMAL HIGH (ref 11.7–15.4)
WBC: 11 10*3/uL — ABNORMAL HIGH (ref 3.4–10.8)

## 2019-04-23 LAB — COMPREHENSIVE METABOLIC PANEL
ALT: 24 IU/L (ref 0–32)
AST: 30 IU/L (ref 0–40)
Albumin/Globulin Ratio: 1.3 (ref 1.2–2.2)
Albumin: 3 g/dL — ABNORMAL LOW (ref 3.8–4.8)
Alkaline Phosphatase: 109 IU/L (ref 39–117)
BUN/Creatinine Ratio: 8 — ABNORMAL LOW (ref 9–23)
BUN: 8 mg/dL (ref 6–24)
Bilirubin Total: 0.3 mg/dL (ref 0.0–1.2)
CO2: 22 mmol/L (ref 20–29)
Calcium: 8.8 mg/dL (ref 8.7–10.2)
Chloride: 104 mmol/L (ref 96–106)
Creatinine, Ser: 1 mg/dL (ref 0.57–1.00)
GFR calc Af Amer: 80 mL/min/{1.73_m2} (ref >59)
GFR calc non Af Amer: 69 mL/min/{1.73_m2} (ref >59)
Globulin, Total: 2.4 g/dL (ref 1.5–4.5)
Glucose: 80 mg/dL (ref 65–99)
Potassium: 4.6 mmol/L (ref 3.5–5.2)
Sodium: 137 mmol/L (ref 134–144)
Total Protein: 5.4 g/dL — ABNORMAL LOW (ref 6.0–8.5)

## 2019-04-23 LAB — TSH: TSH: 306 u[IU]/mL — ABNORMAL HIGH (ref 0.450–4.500)

## 2019-04-23 LAB — VITAMIN D 25 HYDROXY (VIT D DEFICIENCY, FRACTURES): Vit D, 25-Hydroxy: 8 ng/mL — ABNORMAL LOW (ref 30.0–100.0)

## 2019-04-23 LAB — FERRITIN: Ferritin: 14 ng/mL — ABNORMAL LOW (ref 15–150)

## 2019-04-23 MED ORDER — VITAMIN D (ERGOCALCIFEROL) 1.25 MG (50000 UNIT) PO CAPS: 50000.0000 [IU] | ORAL_CAPSULE | ORAL | 0 refills | Status: DC

## 2019-04-24 LAB — HERPES SIMPLEX VIRUS CULTURE

## 2019-04-30 ENCOUNTER — Ambulatory Visit: Payer: BC Managed Care – PPO | Admitting: Family Medicine

## 2019-05-01 ENCOUNTER — Encounter: Payer: Self-pay | Admitting: Family Medicine

## 2019-05-01 ENCOUNTER — Other Ambulatory Visit: Payer: Self-pay

## 2019-05-01 ENCOUNTER — Ambulatory Visit (INDEPENDENT_AMBULATORY_CARE_PROVIDER_SITE_OTHER): Payer: BC Managed Care – PPO | Admitting: Family Medicine

## 2019-05-01 VITALS — BP 110/68 | HR 107 | Temp 97.3°F | Ht 67.0 in | Wt 247.0 lb

## 2019-05-01 DIAGNOSIS — L03012 Cellulitis of left finger: Secondary | ICD-10-CM

## 2019-05-01 MED ORDER — DOXYCYCLINE HYCLATE 100 MG PO TABS: 100.0000 mg | ORAL_TABLET | Freq: Two times a day (BID) | ORAL | 0 refills | Status: DC

## 2019-05-01 NOTE — Patient Instructions (Addendum)
Paronychia Paronychia is an infection of the skin that surrounds a nail. It usually affects the skin around a fingernail, but it may also occur near a toenail. It often causes pain and swelling around the nail. In some cases, a collection of pus (abscess) can form near or under the nail.  This condition may develop suddenly, or it may develop gradually over a longer period. In most cases, paronychia is not serious, and it will clear up with treatment. What are the causes? This condition may be caused by bacteria or a fungus. These germs can enter the body through an opening in the skin, such as a cut or a hangnail. What increases the risk? This condition is more likely to develop in people who:  Get their hands wet often, such as those who work as Fish farm manager, bartenders, or nurses.  Bite their fingernails or suck their thumbs.  Trim their nails very short.  Have hangnails or injured fingertips.  Get manicures.  Have diabetes. What are the signs or symptoms? Symptoms of this condition include:  Redness and swelling of the skin near the nail.  Tenderness around the nail when you touch the area.  Pus-filled bumps under the skin at the base and sides of the nail (cuticle).  Fluid or pus under the nail.  Throbbing pain in the area. How is this diagnosed? This condition is diagnosed with a physical exam. In some cases, a sample of pus may be tested to determine what type of bacteria or fungus is causing the condition. How is this treated? Treatment depends on the cause and severity of your condition. If your condition is mild, it may clear up on its own in a few days or after soaking in warm water. If needed, treatment may include:  Antibiotic medicine, if your infection is caused by bacteria.  Antifungal medicine, if your infection is caused by a fungus.  A procedure to drain pus from an abscess.  Anti-inflammatory medicine (corticosteroids). Follow these instructions at  home: Wound care  Keep the affected area clean.  Soak the affected area in warm water, if told to do so by your health care provider. You may be told to do this for 20 minutes, 2-3 times a day.  Keep the area dry when you are not soaking it.  Do not try to drain an abscess yourself.  Follow instructions from your health care provider about how to take care of the affected area. Make sure you: ? Wash your hands with soap and water before you change your bandage (dressing). If soap and water are not available, use hand sanitizer. ? Change your dressing as told by your health care provider.  If you had an abscess drained, check the area every day for signs of infection. Check for: ? Redness, swelling, or pain. ? Fluid or blood. ? Warmth. ? Pus or a bad smell. Medicines   Take over-the-counter and prescription medicines only as told by your health care provider.  If you were prescribed an antibiotic medicine, take it as told by your health care provider. Do not stop taking the antibiotic even if you start to feel better. General instructions  Avoid contact with harsh chemicals.  Do not pick at the affected area. Prevention  To prevent this condition from happening again: ? Wear rubber gloves when washing dishes or doing other tasks that require your hands to get wet. ? Wear gloves if your hands might come in contact with cleaners or other chemicals. ?  Avoid injuring your nails or fingertips. ? Do not bite your nails or tear hangnails. ? Do not cut your nails very short. ? Do not cut your cuticles. ? Use clean nail clippers or scissors when trimming nails. Contact a health care provider if:  Your symptoms get worse or do not improve with treatment.  You have continued or increased fluid, blood, or pus coming from the affected area.  Your finger or knuckle becomes swollen or difficult to move. Get help right away if you have:  A fever or chills.  Redness spreading away  from the affected area.  Joint or muscle pain. Summary  Paronychia is an infection of the skin that surrounds a nail. It often causes pain and swelling around the nail. In some cases, a collection of pus (abscess) can form near or under the nail.  This condition may be caused by bacteria or a fungus. These germs can enter the body through an opening in the skin, such as a cut or a hangnail.  If your condition is mild, it may clear up on its own in a few days. If needed, treatment may include medicine or a procedure to drain pus from an abscess.  To prevent this condition from happening again, wear gloves if doing tasks that require your hands to get wet or to come in contact with chemicals. Also avoid injuring your nails or fingertips. This information is not intended to replace advice given to you by your health care provider. Make sure you discuss any questions you have with your health care provider. Document Revised: 01/27/2017 Document Reviewed: 01/23/2017 Elsevier Patient Education  [employer].    If you have lab work done today you will be contacted with your lab results within the next 2 weeks.  If you have not heard from [place] then please contact us. The fastest way to get your results is to register for My Chart.   IF you received an x-ray today, you will receive an invoice from [facility]. Please contact [facility] at [phone] with questions or concerns regarding your invoice.   IF you received labwork today, you will receive an invoice from [facility]. Please contact [facility] at [phone] with questions or concerns regarding your invoice.   Our billing staff will not be able to assist you with questions regarding bills from these companies.  You will be contacted with the lab results as soon as they are available. The fastest way to get your results is to activate your My Chart account. Instructions are located on the last page of this paperwork.  If you have not heard from [place] regarding the results in 2 weeks, please contact this office.
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
## 2019-05-08 ENCOUNTER — Ambulatory Visit: Payer: BC Managed Care – PPO | Admitting: Adult Health Nurse Practitioner

## 2019-05-13 ENCOUNTER — Ambulatory Visit: Payer: BC Managed Care – PPO | Admitting: Family Medicine

## 2019-05-19 ENCOUNTER — Other Ambulatory Visit: Payer: Self-pay | Admitting: Family Medicine

## 2019-05-19 DIAGNOSIS — M5030 Other cervical disc degeneration, unspecified cervical region: Secondary | ICD-10-CM

## 2019-05-19 NOTE — Telephone Encounter (Signed)
Requested Prescriptions  Pending Prescriptions Disp Refills  . levothyroxine (SYNTHROID) 200 MCG tablet [Pharmacy Med Name: LEVOTHYROXINE 0.2MG  ( ) TAB] 90 tablet 0    Sig: TAKE 1 TABLET(200 MCG) BY MOUTH DAILY BEFORE BREAKFAST     Endocrinology:  Hypothyroid Agents Failed - 05/19/2019 12:25 AM      Failed - TSH needs to be rechecked within 3 months after an abnormal result. Refill until TSH is due.      Failed - TSH in normal range and within 360 days    TSH  Date Value Ref Range Status  04/22/2019 306.000 (H) 0.450 - 4.500 uIU/mL Final    Comment:    Results confirmed on dilution.          Passed - Valid encounter within last 12 months    Recent Outpatient Visits          2 weeks ago Paronychia of left middle finger   [facility], Meda Coffee, MD   3 weeks ago Hypothyroidism, unspecified type   [facility], Meda Coffee, MD   1 month ago Localized edema   Primary Care at [facility] Lonna Cobb, NP   4 months ago Anal fissure   [facility] at [facility], Meda Coffee, MD   1 year ago Hypothyroidism, unspecified type   [facility], Meda Coffee, MD      Future Appointments            In 1 week Myles Lipps, MD Primary Care at [place], [facility]           . baclofen (LIORESAL) 10 MG tablet [Pharmacy Med Name: BACLOFEN 10MG  TABLETS] 90 tablet 2    Sig: TAKE 1 TABLET(10 MG) BY MOUTH THREE TIMES DAILY     Not Delegated - Analgesics:  Muscle Relaxants Failed - 05/19/2019 12:25 AM      Failed - This refill cannot be delegated      Passed - Valid encounter within last 6 months    Recent Outpatient Visits          2 weeks ago Paronychia of left middle finger   Primary Care at 05/21/2019, [facility], MD   3 weeks ago Hypothyroidism, unspecified type   [facility], [facility], MD   1 month ago Localized edema   Primary Care at [facility], NP   4 months ago Anal fissure   [facility] Care at  [facility], [facility], MD   1 year ago Hypothyroidism, unspecified type   [facility] at [facility], [facility], MD      Future Appointments            In 1 week Meda Coffee, MD Primary Care at [place], [facility]           Patient to have labs checked in 3 months from 04/22/2019.

## 2019-05-19 NOTE — Telephone Encounter (Signed)
Requested medications are due for refill today? Yes - This refill cannot be delegated.    Requested medications are on active medication list?  Yes  Last Refill:   01/01/2019  # 90 with 2 refills   Future visit scheduled?  Yes  Notes to Clinic:  The medication refill cannot be delegated.

## 2019-05-21 ENCOUNTER — Other Ambulatory Visit: Payer: Self-pay | Admitting: Family Medicine

## 2019-05-27 ENCOUNTER — Ambulatory Visit: Payer: BC Managed Care – PPO | Admitting: Family Medicine

## 2019-06-04 ENCOUNTER — Other Ambulatory Visit: Payer: Self-pay

## 2019-06-04 ENCOUNTER — Encounter: Payer: Self-pay | Admitting: Family Medicine

## 2019-06-04 ENCOUNTER — Telehealth (INDEPENDENT_AMBULATORY_CARE_PROVIDER_SITE_OTHER): Payer: BC Managed Care – PPO | Admitting: Family Medicine

## 2019-06-04 DIAGNOSIS — L03012 Cellulitis of left finger: Secondary | ICD-10-CM | POA: Diagnosis not present

## 2019-06-04 MED ORDER — SULFAMETHOXAZOLE-TRIMETHOPRIM 400-80 MG PO TABS: 2.0000 | ORAL_TABLET | Freq: Two times a day (BID) | ORAL | 0 refills | Status: AC
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
## 2019-06-04 NOTE — Patient Instructions (Signed)
° ° ° °  If you have lab work done today you will be contacted with your lab results within the next 2 weeks.  If you have not heard from us then please contact us. The fastest way to get your results is to register for My Chart. ° ° °IF you received an x-ray today, you will receive an invoice from [facility]. Please contact  Radiology at [phone] with questions or concerns regarding your invoice.  ° °IF you received labwork today, you will receive an invoice from [facility]. Please contact [facility] at [phone] with questions or concerns regarding your invoice.  ° °Our billing staff will not be able to assist you with questions regarding bills from these companies. ° °You will be contacted with the lab results as soon as they are available. The fastest way to get your results is to activate your My Chart account. Instructions are located on the last page of this paperwork. If you have not heard from us regarding the results in 2 weeks, please contact this office. °  ° ° ° °

## 2019-07-01 DIAGNOSIS — Z9884 Bariatric surgery status: Secondary | ICD-10-CM | POA: Diagnosis not present

## 2019-07-01 DIAGNOSIS — Z713 Dietary counseling and surveillance: Secondary | ICD-10-CM | POA: Diagnosis not present

## 2019-07-01 DIAGNOSIS — E569 Vitamin deficiency, unspecified: Secondary | ICD-10-CM | POA: Diagnosis not present

## 2019-07-01 DIAGNOSIS — K219 Gastro-esophageal reflux disease without esophagitis: Secondary | ICD-10-CM | POA: Diagnosis not present

## 2019-07-03 DIAGNOSIS — Z713 Dietary counseling and surveillance: Secondary | ICD-10-CM | POA: Diagnosis not present

## 2019-07-03 DIAGNOSIS — Z9884 Bariatric surgery status: Secondary | ICD-10-CM | POA: Diagnosis not present

## 2019-07-08 ENCOUNTER — Other Ambulatory Visit: Payer: Self-pay | Admitting: Physician Assistant

## 2019-07-08 DIAGNOSIS — Z713 Dietary counseling and surveillance: Secondary | ICD-10-CM

## 2019-07-08 DIAGNOSIS — Z9884 Bariatric surgery status: Secondary | ICD-10-CM

## 2019-07-08 DIAGNOSIS — E569 Vitamin deficiency, unspecified: Secondary | ICD-10-CM

## 2019-07-09 ENCOUNTER — Telehealth: Payer: Self-pay | Admitting: Family Medicine

## 2019-07-09 NOTE — Telephone Encounter (Signed)
FYI

## 2019-07-09 NOTE — Telephone Encounter (Signed)
Pt called and wanted provider to know that the antibiotic for pts finger did help.  Pt also stated that she did her blood work at [facility] at [place] on [address] around the 07/01/19. Blood work isn't back yet but wanted provider to know that and if provider is wanting the labs to look over them she will just need to contact that office. Pt also wanted provider know she is working with [facility] as well to get her levels at normal range. [facility] is where pt went to get weight loss surgery that is why pt went there for labs. Please advise.

## 2019-07-12 ENCOUNTER — Other Ambulatory Visit: Payer: Self-pay | Admitting: Family Medicine

## 2019-07-12 ENCOUNTER — Other Ambulatory Visit: Payer: Self-pay

## 2019-07-12 DIAGNOSIS — F411 Generalized anxiety disorder: Secondary | ICD-10-CM

## 2019-07-12 DIAGNOSIS — I10 Essential (primary) hypertension: Secondary | ICD-10-CM

## 2019-07-12 MED ORDER — HYDROXYZINE HCL 25 MG PO TABS: 25.0000 mg | ORAL_TABLET | Freq: Two times a day (BID) | ORAL | 0 refills | Status: DC | PRN

## 2019-07-12 MED ORDER — CHLORTHALIDONE 25 MG PO TABS: 25.0000 mg | ORAL_TABLET | Freq: Every day | ORAL | 1 refills | Status: DC

## 2019-07-12 NOTE — Telephone Encounter (Signed)
What is the name of the medication? hydrOXYzine (ATARAX/VISTARIL) 25 MG tablet [[id number]]   Vitamin D, Ergocalciferol, (DRISDOL) 1.25 MG (50000 UNIT) CAPS capsule [[id number]]      Have you contacted your pharmacy to request a refill? Yes   Which pharmacy would you like this sent to? [facility] #[id number] Ginette Otto, [place] - [address] AT [facility] [address]  [address] [place], [place] [address]    Patient notified that their request is being sent to the clinical staff for review and that they should receive a call once it is complete. If they do not receive a call within 72 hours they can check with their pharmacy or our office.    Patient stated  she called last week to ask for refills although it was not requested properly . She now has swelling and needs the medication

## 2019-07-16 ENCOUNTER — Other Ambulatory Visit: Payer: Self-pay | Admitting: Family Medicine

## 2019-07-16 DIAGNOSIS — E559 Vitamin D deficiency, unspecified: Secondary | ICD-10-CM

## 2019-07-16 MED ORDER — VITAMIN D (ERGOCALCIFEROL) 1.25 MG (50000 UNIT) PO CAPS: 50000.0000 [IU] | ORAL_CAPSULE | ORAL | 0 refills | Status: DC

## 2019-07-16 NOTE — Telephone Encounter (Signed)
Scheduled pt with Dr. Alwyn Ren for vit D check

## 2019-07-19 ENCOUNTER — Other Ambulatory Visit: Payer: BLUE CROSS/BLUE SHIELD

## 2019-07-26 ENCOUNTER — Ambulatory Visit: Payer: BC Managed Care – PPO | Admitting: Family Medicine

## 2019-07-26 ENCOUNTER — Ambulatory Visit
Admission: RE | Admit: 2019-07-26 | Discharge: 2019-07-26 | Disposition: A | Discharge status: Home / Self Care | Payer: BC Managed Care – PPO | Source: Ambulatory Visit | Attending: Physician Assistant | Admitting: Physician Assistant

## 2019-07-26 DIAGNOSIS — E569 Vitamin deficiency, unspecified: Secondary | ICD-10-CM

## 2019-07-26 DIAGNOSIS — K224 Dyskinesia of esophagus: Secondary | ICD-10-CM | POA: Diagnosis not present

## 2019-07-26 DIAGNOSIS — Z9884 Bariatric surgery status: Secondary | ICD-10-CM

## 2019-07-26 DIAGNOSIS — Z713 Dietary counseling and surveillance: Secondary | ICD-10-CM

## 2019-08-02 ENCOUNTER — Ambulatory Visit: Payer: BC Managed Care – PPO | Admitting: Registered Nurse

## 2019-08-09 ENCOUNTER — Other Ambulatory Visit: Payer: Self-pay

## 2019-08-09 ENCOUNTER — Ambulatory Visit: Payer: BC Managed Care – PPO | Admitting: Registered Nurse

## 2019-08-09 DIAGNOSIS — R790 Abnormal level of blood mineral: Secondary | ICD-10-CM | POA: Diagnosis not present

## 2019-08-09 DIAGNOSIS — E559 Vitamin D deficiency, unspecified: Secondary | ICD-10-CM | POA: Diagnosis not present

## 2019-08-09 DIAGNOSIS — E039 Hypothyroidism, unspecified: Secondary | ICD-10-CM | POA: Diagnosis not present

## 2019-08-10 LAB — VITAMIN D 25 HYDROXY (VIT D DEFICIENCY, FRACTURES): Vit D, 25-Hydroxy: 18.8 ng/mL — ABNORMAL LOW (ref 30.0–100.0)

## 2019-08-10 LAB — FERRITIN: Ferritin: 46 ng/mL (ref 15–150)

## 2019-08-10 LAB — TSH: TSH: 45.2 u[IU]/mL — ABNORMAL HIGH (ref 0.450–4.500)

## 2019-08-12 ENCOUNTER — Other Ambulatory Visit: Payer: Self-pay | Admitting: Family Medicine

## 2019-08-12 DIAGNOSIS — E559 Vitamin D deficiency, unspecified: Secondary | ICD-10-CM

## 2019-08-12 MED ORDER — VITAMIN D (ERGOCALCIFEROL) 1.25 MG (50000 UNIT) PO CAPS: 50000.0000 [IU] | ORAL_CAPSULE | ORAL | 0 refills | Status: DC

## 2019-08-17 ENCOUNTER — Other Ambulatory Visit: Payer: Self-pay | Admitting: Family Medicine

## 2019-08-17 DIAGNOSIS — I10 Essential (primary) hypertension: Secondary | ICD-10-CM

## 2019-08-17 NOTE — Telephone Encounter (Signed)
Requested Prescriptions  Pending Prescriptions Disp Refills  . triamterene-hydrochlorothiazide (MAXZIDE-25) 37.5-25 MG tablet [Pharmacy Med Name: TRIAMTERENE 37.5MG / HCTZ 25MG  TABS] 90 tablet     Sig: TAKE 1 TABLET BY MOUTH DAILY     Cardiovascular: Diuretic Combos Passed - 08/17/2019  3:21 PM      Passed - K in normal range and within 360 days    Potassium  Date Value Ref Range Status  04/22/2019 4.6 3.5 - 5.2 mmol/L Final         Passed - Na in normal range and within 360 days    Sodium  Date Value Ref Range Status  04/22/2019 137 134 - 144 mmol/L Final         Passed - Cr in normal range and within 360 days    Creat  Date Value Ref Range Status  08/02/2016 0.73 0.50 - 1.10 mg/dL Final   Creatinine, Ser  Date Value Ref Range Status  04/22/2019 1.00 0.57 - 1.00 mg/dL Final         Passed - Ca in normal range and within 360 days    Calcium  Date Value Ref Range Status  04/22/2019 8.8 8.7 - 10.2 mg/dL Final   Calcium, Ion  Date Value Ref Range Status  11/30/2012 1.16 1.12 - 1.23 mmol/L Final         Passed - Last BP in normal range    BP Readings from Last 1 Encounters:  05/01/19 110/68         Passed - Valid encounter within last 6 months    Recent Outpatient Visits          2 months ago Paronychia of finger of left hand   Primary Care at 07/01/19, [facility], MD   3 months ago Paronychia of left middle finger   [facility] at [facility], [facility], MD   3 months ago Hypothyroidism, unspecified type   [facility] at [facility], [facility], MD   4 months ago Localized edema   Primary Care at [facility], NP   7 months ago Anal fissure   [facility] at [facility], [facility], MD             . atenolol (TENORMIN) 25 MG tablet [Pharmacy Med Name: ATENOLOL 25MG  TABLETS] 90 tablet 0    Sig: TAKE 1 TABLET(25 MG) BY MOUTH DAILY     Cardiovascular:  Beta Blockers Passed - 08/17/2019  3:21 PM      Passed - Last BP in normal range    BP  Readings from Last 1 Encounters:  05/01/19 110/68         Passed - Last Heart Rate in normal range    Pulse Readings from Last 1 Encounters:  05/01/19 (!) 107         Passed - Valid encounter within last 6 months    Recent Outpatient Visits          2 months ago Paronychia of finger of left hand   Primary Care at 07/01/19, 07/01/19, MD   3 months ago Paronychia of left middle finger   [facility] at [facility], [facility], MD   3 months ago Hypothyroidism, unspecified type   [facility] at [facility], [facility], MD   4 months ago Localized edema   Primary Care at [facility], [facility], NP   7 months ago Anal fissure   Primary Care at [facility], [facility], MD             . levothyroxine (SYNTHROID) 200 MCG tablet [Pharmacy Med Name: LEVOTHYROXINE 0.2MG  ( ) TAB] 90 tablet 0    Sig: TAKE 1 TABLET(200 MCG) BY MOUTH DAILY BEFORE BREAKFAST     Endocrinology:  Hypothyroid Agents Failed - 08/17/2019  3:21 PM      Failed - TSH needs to be rechecked within 3 months after an abnormal result. Refill until TSH is due.      Failed - TSH in normal range and within 360 days    TSH  Date Value Ref Range Status  08/09/2019 45.200 (H) 0.450 - 4.500 uIU/mL Final         Passed - Valid encounter within last 12 months    Recent Outpatient Visits          2 months ago Paronychia of finger of left hand   [facility] at [facility], [facility], MD   3 months ago Paronychia of left middle finger   [facility] at [facility], [facility], MD   3 months ago Hypothyroidism, unspecified type   [facility] at [facility], [facility], MD   4 months ago Localized edema   Primary Care at [facility], [facility], NP   7 months ago Anal fissure   [facility] at [facility], [facility], MD

## 2019-09-07 ENCOUNTER — Other Ambulatory Visit: Payer: Self-pay | Admitting: Family Medicine

## 2019-09-07 DIAGNOSIS — I10 Essential (primary) hypertension: Secondary | ICD-10-CM

## 2019-09-07 DIAGNOSIS — M5030 Other cervical disc degeneration, unspecified cervical region: Secondary | ICD-10-CM

## 2019-09-07 NOTE — Telephone Encounter (Signed)
Requested medication (s) are due for refill today: yes  Requested medication (s) are on the active medication list: yes  Last refill:  05/19/19  Future visit scheduled: no  Notes to clinic:  med not delegated to NT to RF   Requested Prescriptions  Pending Prescriptions Disp Refills   baclofen (LIORESAL) 10 MG tablet [Pharmacy Med Name: BACLOFEN 10MG  TABLETS] 90 tablet     Sig: TAKE 1 TABLET(10 MG) BY MOUTH THREE TIMES DAILY      Not Delegated - Analgesics:  Muscle Relaxants Failed - 09/07/2019  1:01 PM      Failed - This refill cannot be delegated      Passed - Valid encounter within last 6 months    Recent Outpatient Visits           3 months ago Paronychia of finger of left hand   Primary Care at 09/09/2019, [facility], MD   4 months ago Paronychia of left middle finger   [facility] at [facility], [facility], MD   4 months ago Hypothyroidism, unspecified type   [facility] at [facility], [facility], MD   5 months ago Localized edema   Primary Care at [facility], [facility], NP   8 months ago Anal fissure   [facility] at [facility], [facility], MD               Signed Prescriptions Disp Refills   chlorthalidone (HYGROTON) 25 MG tablet 90 tablet 0    Sig: TAKE 1 TABLET(25 MG) BY MOUTH DAILY      Cardiovascular: Diuretics - Thiazide Passed - 09/07/2019  1:01 PM      Passed - Ca in normal range and within 360 days    Calcium  Date Value Ref Range Status  04/22/2019 8.8 8.7 - 10.2 mg/dL Final   Calcium, Ion  Date Value Ref Range Status  11/30/2012 1.16 1.12 - 1.23 mmol/L Final          Passed - Cr in normal range and within 360 days    Creat  Date Value Ref Range Status  08/02/2016 0.73 0.50 - 1.10 mg/dL Final   Creatinine, Ser  Date Value Ref Range Status  04/22/2019 1.00 0.57 - 1.00 mg/dL Final          Passed - K in normal range and within 360 days    Potassium  Date Value Ref Range Status  04/22/2019 4.6 3.5 - 5.2 mmol/L Final           Passed - Na in normal range and within 360 days    Sodium  Date Value Ref Range Status  04/22/2019 137 134 - 144 mmol/L Final          Passed - Last BP in normal range    BP Readings from Last 1 Encounters:  05/01/19 110/68          Passed - Valid encounter within last 6 months    Recent Outpatient Visits           3 months ago Paronychia of finger of left hand   Primary Care at 07/01/19, [facility], MD   4 months ago Paronychia of left middle finger   [facility] at [facility], [facility], MD   4 months ago Hypothyroidism, unspecified type   [facility] at [facility], [facility], MD   5 months ago Localized edema   Primary Care at [facility], [facility], NP  8 months ago Anal fissure   [facility] at [facility], [facility], MD

## 2019-09-07 NOTE — Telephone Encounter (Signed)
Requested Prescriptions  Pending Prescriptions Disp Refills   baclofen (LIORESAL) 10 MG tablet [Pharmacy Med Name: BACLOFEN 10MG  TABLETS] 90 tablet     Sig: TAKE 1 TABLET(10 MG) BY MOUTH THREE TIMES DAILY     Not Delegated - Analgesics:  Muscle Relaxants Failed - 09/07/2019  1:01 PM      Failed - This refill cannot be delegated      Passed - Valid encounter within last 6 months    Recent Outpatient Visits          3 months ago Paronychia of finger of left hand   Primary Care at 09/09/2019, [facility], MD   4 months ago Paronychia of left middle finger   [facility] at [facility], [facility], MD   4 months ago Hypothyroidism, unspecified type   Primary Care at [facility], [facility], MD   5 months ago Localized edema   Primary Care at [facility], [facility], NP   8 months ago Anal fissure   [facility] at [facility], [facility], MD              chlorthalidone (HYGROTON) 25 MG tablet [Pharmacy Med Name: CHLORTHALIDONE 25MG  TABLETS] 90 tablet 0    Sig: TAKE 1 TABLET(25 MG) BY MOUTH DAILY     Cardiovascular: Diuretics - Thiazide Passed - 09/07/2019  1:01 PM      Passed - Ca in normal range and within 360 days    Calcium  Date Value Ref Range Status  04/22/2019 8.8 8.7 - 10.2 mg/dL Final   Calcium, Ion  Date Value Ref Range Status  11/30/2012 1.16 1.12 - 1.23 mmol/L Final         Passed - Cr in normal range and within 360 days    Creat  Date Value Ref Range Status  08/02/2016 0.73 0.50 - 1.10 mg/dL Final   Creatinine, Ser  Date Value Ref Range Status  04/22/2019 1.00 0.57 - 1.00 mg/dL Final         Passed - K in normal range and within 360 days    Potassium  Date Value Ref Range Status  04/22/2019 4.6 3.5 - 5.2 mmol/L Final         Passed - Na in normal range and within 360 days    Sodium  Date Value Ref Range Status  04/22/2019 137 134 - 144 mmol/L Final         Passed - Last BP in normal range    BP Readings from Last 1 Encounters:  05/01/19  110/68         Passed - Valid encounter within last 6 months    Recent Outpatient Visits          3 months ago Paronychia of finger of left hand   Primary Care at 04/24/2019, 07/01/19, MD   4 months ago Paronychia of left middle finger   [facility] at [facility], [facility], MD   4 months ago Hypothyroidism, unspecified type   [facility] at [facility], [facility], MD   5 months ago Localized edema   Primary Care at [facility], [facility], NP   8 months ago Anal fissure   Primary Care at [facility], [facility], MD

## 2019-10-03 ENCOUNTER — Other Ambulatory Visit: Payer: Self-pay | Admitting: Family Medicine

## 2019-10-03 DIAGNOSIS — E559 Vitamin D deficiency, unspecified: Secondary | ICD-10-CM

## 2019-10-03 NOTE — Telephone Encounter (Signed)
Requested medication (s) are due for refill today: yes   Requested medication (s) are on the active medication list: yes   Last refill: 08/17/2019  Future visit scheduled:no  Notes to clinic:  this refill cannot be delegated    Requested Prescriptions  Pending Prescriptions Disp Refills   Vitamin D, Ergocalciferol, (DRISDOL) 1.25 MG (50000 UNIT) CAPS capsule [Pharmacy Med Name: VITAMIN D2 50,000IU (ERGO) CAP RX] 6 capsule 0    Sig: TAKE 1 CAPSULE BY MOUTH EVERY 7 DAYS      Endocrinology:  Vitamins - Vitamin D Supplementation Failed - 10/03/2019 10:12 AM      Failed - 50,000 IU strengths are not delegated      Failed - Phosphate in normal range and within 360 days    No results found for: PHOS        Failed - Vitamin D in normal range and within 360 days    Vit D, 25-Hydroxy  Date Value Ref Range Status  08/09/2019 18.8 (L) 30.0 - 100.0 ng/mL Final    Comment:    Vitamin D deficiency has been defined by the Institute of Medicine and an Endocrine Society practice guideline as a level of serum 25-OH vitamin D less than 20 ng/mL (1,2). The Endocrine Society went on to further define vitamin D insufficiency as a level between 21 and 29 ng/mL (2). 1. IOM (Institute of Medicine). 2010. Dietary reference    intakes for calcium and D. [place]: The    [employer]. 2. Holick MF, [place], Bischoff-Ferrari HA, et al.    Evaluation, treatment, and prevention of vitamin D    deficiency: an Endocrine Society clinical practice    guideline. JCEM. 2011 Jul; 96(7):1911-30.           Passed - Ca in normal range and within 360 days    Calcium  Date Value Ref Range Status  04/22/2019 8.8 8.7 - 10.2 mg/dL Final   Calcium, Ion  Date Value Ref Range Status  11/30/2012 1.16 1.12 - 1.23 mmol/L Final          Passed - Valid encounter within last 12 months    Recent Outpatient Visits           4 months ago Paronychia of finger of left hand   [facility] at [facility], MD   5 months ago Paronychia of left middle finger   [facility] at [facility], [facility], MD   5 months ago Hypothyroidism, unspecified type   [facility] at [facility], [facility], MD   6 months ago Localized edema   Primary Care at [facility], [facility], NP   9 months ago Anal fissure   [facility] at [facility], [facility], MD

## 2019-10-29 ENCOUNTER — Other Ambulatory Visit: Payer: Self-pay | Admitting: Family Medicine

## 2019-11-02 DIAGNOSIS — F3132 Bipolar disorder, current episode depressed, moderate: Secondary | ICD-10-CM | POA: Diagnosis not present

## 2019-11-02 DIAGNOSIS — F900 Attention-deficit hyperactivity disorder, predominantly inattentive type: Secondary | ICD-10-CM | POA: Diagnosis not present

## 2019-11-02 DIAGNOSIS — F3174 Bipolar disorder, in full remission, most recent episode manic: Secondary | ICD-10-CM | POA: Diagnosis not present

## 2019-12-11 ENCOUNTER — Telehealth: Payer: Self-pay | Admitting: Family Medicine

## 2019-12-11 NOTE — Telephone Encounter (Signed)
Pt has an appt 12/31/2019 with [facility] for med refills   Pt is asking for a water pill that was prescribed a while What is the name of the medication? triamterene-hydrochlorothiazide (MAXZIDE-25) 37.5-25 MG tablet [[id number]] DISCONTINUED   Have you contacted your pharmacy to request a refill? Y  Which pharmacy would you like this sent to? [facility] #[id number] Ginette Otto, [place] - [address] AT [facility] [address]  [address] [place], [place] [address]  Phone:  [phone] Fax:  [phone]    Patient notified that their request is being sent to the clinical staff for review and that they should receive a call once it is complete. If they do not receive a call within 72 hours they can check with their pharmacy or our office.   Pt is getting ready to take a trip legs are swelling

## 2019-12-16 ENCOUNTER — Other Ambulatory Visit: Payer: Self-pay

## 2019-12-16 MED ORDER — TRIAMTERENE-HCTZ 37.5-25 MG PO TABS: 1.0000 | ORAL_TABLET | Freq: Every day | ORAL | 1 refills | Status: DC

## 2019-12-16 NOTE — Telephone Encounter (Signed)
Pt is calling and needing this med refill before she travels on the 11/24. Pt has and appt for 11/23 but would really want this called in. Pt is very upset and needs this.  triamterene-hydrochlorothiazide (MAXZIDE-25) 37.5-25 MG tablet [[id number]] DISCONTINUED Please advise.

## 2019-12-17 ENCOUNTER — Ambulatory Visit: Payer: BC Managed Care – PPO | Admitting: Registered Nurse

## 2019-12-17 NOTE — Telephone Encounter (Signed)
Refill has been sent and patient has been notified

## 2019-12-24 ENCOUNTER — Other Ambulatory Visit: Payer: Self-pay

## 2019-12-24 ENCOUNTER — Ambulatory Visit: Payer: BC Managed Care – PPO | Admitting: Registered Nurse

## 2019-12-24 ENCOUNTER — Encounter: Payer: Self-pay | Admitting: Registered Nurse

## 2019-12-24 VITALS — BP 127/86 | HR 94 | Temp 97.8°F | Resp 18 | Ht 67.0 in | Wt 194.0 lb

## 2019-12-24 DIAGNOSIS — M5030 Other cervical disc degeneration, unspecified cervical region: Secondary | ICD-10-CM

## 2019-12-24 NOTE — Patient Instructions (Signed)
° ° ° °  If you have lab work done today you will be contacted with your lab results within the next 2 weeks.  If you have not heard from us then please contact us. The fastest way to get your results is to register for My Chart. ° ° °IF you received an x-ray today, you will receive an invoice from [facility]. Please contact  Radiology at [phone] with questions or concerns regarding your invoice.  ° °IF you received labwork today, you will receive an invoice from [facility]. Please contact [facility] at [phone] with questions or concerns regarding your invoice.  ° °Our billing staff will not be able to assist you with questions regarding bills from these companies. ° °You will be contacted with the lab results as soon as they are available. The fastest way to get your results is to activate your My Chart account. Instructions are located on the last page of this paperwork. If you have not heard from us regarding the results in 2 weeks, please contact this office. °  ° ° ° °

## 2019-12-26 ENCOUNTER — Telehealth: Payer: Self-pay | Admitting: Registered Nurse

## 2019-12-26 NOTE — Telephone Encounter (Signed)
Pt calling stated that she was suppose to have a Rx sent in yesterday Rx called Lyrica. Pt called pharmacy and stated they never received this Rx. Pt would like a call about this tomorrow morning. Please advise.

## 2019-12-27 MED ORDER — PREGABALIN 50 MG PO CAPS: 50.0000 mg | ORAL_CAPSULE | Freq: Three times a day (TID) | ORAL | 0 refills | Status: DC

## 2019-12-27 NOTE — Telephone Encounter (Signed)
Pt reports she was supposed to get a Lyrica Rx to her pharmacy I see no previous rx please advise or send and I can inform pt. Thank you

## 2019-12-27 NOTE — Telephone Encounter (Signed)
Message was sent to [facility] just waiting on an Response regarding this medication.

## 2019-12-27 NOTE — Telephone Encounter (Signed)
Patient is calling back looking for this  RX to

## 2019-12-28 ENCOUNTER — Other Ambulatory Visit: Payer: Self-pay | Admitting: Family Medicine

## 2019-12-28 ENCOUNTER — Encounter: Payer: Self-pay | Admitting: Family Medicine

## 2019-12-28 DIAGNOSIS — M5030 Other cervical disc degeneration, unspecified cervical region: Secondary | ICD-10-CM

## 2019-12-28 MED ORDER — PREGABALIN 50 MG PO CAPS: 50.0000 mg | ORAL_CAPSULE | Freq: Three times a day (TID) | ORAL | 0 refills | Status: DC

## 2019-12-31 ENCOUNTER — Ambulatory Visit: Payer: BC Managed Care – PPO | Admitting: Family Medicine

## 2020-01-08 ENCOUNTER — Telehealth: Payer: Self-pay | Admitting: Registered Nurse

## 2020-01-08 DIAGNOSIS — M5030 Other cervical disc degeneration, unspecified cervical region: Secondary | ICD-10-CM

## 2020-01-08 DIAGNOSIS — F411 Generalized anxiety disorder: Secondary | ICD-10-CM

## 2020-01-08 NOTE — Telephone Encounter (Signed)
Renee [name] [place] called on behalf of Renee [name]. Renee [name] had an appt on 12/24/19 regarding wanting this referral. I do not see a referral in pts chart. Renee [name] would like a  Call when this is done. Please advise.

## 2020-01-08 NOTE — Telephone Encounter (Signed)
Referral has been placed. 

## 2020-01-20 ENCOUNTER — Telehealth: Payer: Self-pay | Admitting: General Practice

## 2020-01-20 NOTE — Telephone Encounter (Signed)
Patient has bulging disks and degenerative stenosis. Medication  pregabalin (LYRICA) 50 MG capsule [[id number]]  not working. Patient having a lot of pain. Please advise at [phone]. Scheduled appointment with [facility] for 12/28.

## 2020-01-21 ENCOUNTER — Encounter: Payer: Self-pay | Admitting: Family Medicine

## 2020-01-21 ENCOUNTER — Ambulatory Visit: Payer: BC Managed Care – PPO | Admitting: Family Medicine

## 2020-01-21 ENCOUNTER — Other Ambulatory Visit: Payer: Self-pay

## 2020-01-21 VITALS — BP 117/83 | HR 99 | Temp 97.9°F | Ht 67.0 in | Wt 192.0 lb

## 2020-01-21 DIAGNOSIS — Z9884 Bariatric surgery status: Secondary | ICD-10-CM | POA: Diagnosis not present

## 2020-01-21 DIAGNOSIS — H9193 Unspecified hearing loss, bilateral: Secondary | ICD-10-CM

## 2020-01-21 DIAGNOSIS — M5030 Other cervical disc degeneration, unspecified cervical region: Secondary | ICD-10-CM | POA: Diagnosis not present

## 2020-01-21 DIAGNOSIS — J309 Allergic rhinitis, unspecified: Secondary | ICD-10-CM

## 2020-01-21 DIAGNOSIS — T7840XD Allergy, unspecified, subsequent encounter: Secondary | ICD-10-CM

## 2020-01-21 MED ORDER — PANTOPRAZOLE SODIUM 40 MG PO TBEC: 40.0000 mg | DELAYED_RELEASE_TABLET | Freq: Every day | ORAL | 3 refills | Status: DC

## 2020-01-21 MED ORDER — CYCLOBENZAPRINE HCL 5 MG PO TABS: 5.0000 mg | ORAL_TABLET | Freq: Every day | ORAL | 0 refills | Status: DC

## 2020-01-21 MED ORDER — MONTELUKAST SODIUM 10 MG PO TABS: 10.0000 mg | ORAL_TABLET | Freq: Every day | ORAL | 3 refills | Status: DC

## 2020-01-21 MED ORDER — DULOXETINE HCL 30 MG PO CPEP: 30.0000 mg | ORAL_CAPSULE | Freq: Every day | ORAL | 3 refills | Status: DC

## 2020-01-21 MED ORDER — FLUTICASONE PROPIONATE 50 MCG/ACT NA SUSP: 2.0000 | Freq: Every day | NASAL | 6 refills | Status: DC

## 2020-01-21 MED ORDER — BACLOFEN 10 MG PO TABS: 10.0000 mg | ORAL_TABLET | Freq: Three times a day (TID) | ORAL | 0 refills | Status: DC | PRN
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
## 2020-01-21 NOTE — Patient Instructions (Addendum)
 Cervical Radiculopathy  Cervical radiculopathy means that a nerve in the neck (a cervical nerve) is pinched or bruised. This can happen because of an injury to the cervical spine (vertebrae) in the neck, or as a normal part of getting older. This can cause pain or loss of feeling (numbness) that runs from your neck all the way down to your arm and fingers. Often, this condition gets better with rest. Treatment may be needed if the condition does not get better. What are the causes?  A neck injury.  A bulging disk in your spine.  Muscle movements that you cannot control (muscle spasms).  Tight muscles in your neck due to overuse.  Arthritis.  Breakdown in the bones and joints of the spine (spondylosis) due to getting older.  Bone spurs that form near the nerves in the neck. What are the signs or symptoms?  Pain. The pain may: ? Run from the neck to the arm and hand. ? Be very bad or irritating. ? Be worse when you move your neck.  Loss of feeling or tingling in your arm or hand.  Weakness in your arm or hand, in very bad cases. How is this treated? In many cases, treatment is not needed for this condition. With rest, the condition often gets better over time. If treatment is needed, options may include:  Wearing a soft neck collar (cervical collar) for short periods of time, as told by your doctor.  Doing exercises (physical therapy) to strengthen your neck muscles.  Taking medicines.  Having shots (injections) in your spine, in very bad cases.  Having surgery. This may be needed if other treatments do not help. The type of surgery that is used depends on the cause of your condition. Follow these instructions at home: If you have a soft neck collar:  Wear it as told by your doctor. Remove it only as told by your doctor.  Ask your doctor if you can remove the collar for cleaning and bathing. If you are allowed to remove the collar for cleaning or bathing: ? Follow  instructions from your doctor about how to remove the collar safely. ? Clean the collar by wiping it with mild soap and water and drying it completely. ? Take out any removable pads in the collar every 1-2 days. Wash them by hand with soap and water. Let them air-dry completely before you put them back in the collar. ? Check your skin under the collar for redness or sores. If you see any, tell your doctor. Managing pain      Take over-the-counter and prescription medicines only as told by your doctor.  If told, put ice on the painful area. ? If you have a soft neck collar, remove it as told by your doctor. ? Put ice in a plastic bag. ? Place a towel between your skin and the bag. ? Leave the ice on for 20 minutes, 2-3 times a day.  If using ice does not help, you can try using heat. Use the heat source that your doctor recommends, such as a moist heat pack or a heating pad. ? Place a towel between your skin and the heat source. ? Leave the heat on for 20-30 minutes. ? Remove the heat if your skin turns bright red. This is very important if you are unable to feel pain, heat, or cold. You may have a greater risk of getting burned.  You may try a gentle neck and shoulder rub (massage). Activity    Rest as needed.  Return to your normal activities as told by your doctor. Ask your doctor what activities are safe for you.  Do exercises as told by your doctor or physical therapist.  Do not lift anything that is heavier than 10 lb (4.5 kg) until your doctor tells you that it is safe. General instructions  Use a flat pillow when you sleep.  Do not drive while wearing a soft neck collar. If you do not have a soft neck collar, ask your doctor if it is safe to drive while your neck heals.  Ask your doctor if the medicine prescribed to you requires you to avoid driving or using heavy machinery.  Do not use any products that contain nicotine or tobacco, such as cigarettes, e-cigarettes, and  chewing tobacco. These can delay healing. If you need help quitting, ask your doctor.  Keep all follow-up visits as told by your doctor. This is important. Contact a doctor if:  Your condition does not get better with treatment. Get help right away if:  Your pain gets worse and is not helped with medicine.  You lose feeling or feel weak in your hand, arm, face, or leg.  You have a high fever.  You have a stiff neck.  You cannot control when you poop or pee (have incontinence).  You have trouble with walking, balance, or talking. Summary  Cervical radiculopathy means that a nerve in the neck is pinched or bruised.  A nerve can get pinched from a bulging disk, arthritis, an injury to the neck, or other causes.  Symptoms include pain, tingling, or loss of feeling that goes from the neck into the arm or hand.  Weakness in your arm or hand can happen in very bad cases.  Treatment may include resting, wearing a soft neck collar, and doing exercises. You might need to take medicines for pain. In very bad cases, shots or surgery may be needed. This information is not intended to replace advice given to you by your health care provider. Make sure you discuss any questions you have with your health care provider. Document Revised: 12/01/2017 Document Reviewed: 12/01/2017 Elsevier Patient Education  2020 [employer]   If you have lab work done today you will be contacted with your lab results within the next 2 weeks.  If you have not heard from us then please contact us. The fastest way to get your results is to register for My Chart.   IF you received an x-ray today, you will receive an invoice from [facility]. Please contact [facility] at [phone] with questions or concerns regarding your invoice.   IF you received labwork today, you will receive an invoice from [facility]. Please contact [facility] at [phone] with questions or concerns regarding your  invoice.   Our billing staff will not be able to assist you with questions regarding bills from these companies.  You will be contacted with the lab results as soon as they are available. The fastest way to get your results is to activate your My Chart account. Instructions are located on the last page of this paperwork. If you have not heard from us regarding the results in 2 weeks, please contact this office.      

## 2020-01-23 ENCOUNTER — Telehealth: Payer: Self-pay | Admitting: *Deleted

## 2020-01-23 NOTE — Telephone Encounter (Signed)
Faxed paperwork over for prior approval of pantoprazole sodium

## 2020-02-20 ENCOUNTER — Other Ambulatory Visit: Payer: Self-pay

## 2020-02-20 ENCOUNTER — Encounter: Payer: Self-pay | Admitting: Family Medicine

## 2020-02-20 ENCOUNTER — Telehealth (INDEPENDENT_AMBULATORY_CARE_PROVIDER_SITE_OTHER): Payer: BC Managed Care – PPO | Admitting: Family Medicine

## 2020-02-20 DIAGNOSIS — Z1152 Encounter for screening for COVID-19: Secondary | ICD-10-CM

## 2020-02-20 DIAGNOSIS — J3489 Other specified disorders of nose and nasal sinuses: Secondary | ICD-10-CM

## 2020-02-20 NOTE — Patient Instructions (Addendum)
  Viral Respiratory Infection A viral respiratory infection is an illness that affects parts of the body that are used for breathing. These include the lungs, nose, and throat. It is caused by a germ called a virus. Some examples of this kind of infection are:  A cold.  The flu (influenza).  A respiratory syncytial virus (RSV) infection. A person who gets this illness may have the following symptoms:  A stuffy or runny nose.  Yellow or green fluid in the nose.  A cough.  Sneezing.  Tiredness (fatigue).  Achy muscles.  A sore throat.  Sweating or chills.  A fever.  A headache. Follow these instructions at home: Managing pain and congestion  Take over-the-counter and prescription medicines only as told by your doctor.  If you have a sore throat, gargle with salt water. Do this 3-4 times per day or as needed. To make a salt-water mixture, dissolve -1 tsp of salt in 1 cup of warm water. Make sure that all the salt dissolves.  Use nose drops made from salt water. This helps with stuffiness (congestion). It also helps soften the skin around your nose.  Drink enough fluid to keep your pee (urine) pale yellow. General instructions  Rest as much as possible.  Do not drink alcohol.  Do not use any products that have nicotine or tobacco, such as cigarettes and e-cigarettes. If you need help quitting, ask your doctor.  Keep all follow-up visits as told by your doctor. This is important.   How is this prevented?  Get a flu shot every year. Ask your doctor when you should get your flu shot.  Do not let other people get your germs. If you are sick: ? Stay home from work or school. ? Wash your hands with soap and water often. Wash your hands after you cough or sneeze. If soap and water are not available, use hand sanitizer.  Avoid contact with people who are sick during cold and flu season. This is in fall and winter.   Get help if:  Your symptoms last for 10 days or  longer.  Your symptoms get worse over time.  You have a fever.  You have very bad pain in your face or forehead.  Parts of your jaw or neck become very swollen. Get help right away if:  You feel pain or pressure in your chest.  You have shortness of breath.  You faint or feel like you will faint.  You keep throwing up (vomiting).  You feel confused. Summary  A viral respiratory infection is an illness that affects parts of the body that are used for breathing.  Examples of this illness include a cold, the flu, and respiratory syncytial virus (RSV) infection.  The infection can cause a runny nose, cough, sneezing, sore throat, and fever.  Follow what your doctor tells you about taking medicines, drinking lots of fluid, washing your hands, resting at home, and avoiding people who are sick. This information is not intended to replace advice given to you by your health care provider. Make sure you discuss any questions you have with your health care provider. Document Revised: 01/18/2018 Document Reviewed: 02/20/2017 Elsevier Patient Education  2021 [employer].  If you have lab work done today you will be contacted with your lab results within the next 2 weeks.  If you have not heard from us then please contact us. The fastest way to get your results is to register for My Chart.     IF you received an x-ray today, you will receive an invoice from [facility]. Please contact [facility] at [phone] with questions or concerns regarding your invoice.   IF you received labwork today, you will receive an invoice from [facility]. Please contact [facility] at [phone] with questions or concerns regarding your invoice.   Our billing staff will not be able to assist you with questions regarding bills from these companies.  You will be contacted with the lab results as soon as they are available. The fastest way to get your results is to activate your My Chart  account. Instructions are located on the last page of this paperwork. If you have not heard from us regarding the results in 2 weeks, please contact this office.      
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
## 2020-02-22 LAB — COVID-19, FLU A+B AND RSV
Influenza A, NAA: NOT DETECTED
Influenza B, NAA: NOT DETECTED
RSV, NAA: NOT DETECTED
SARS-CoV-2, NAA: NOT DETECTED

## 2020-02-25 ENCOUNTER — Institutional Professional Consult (permissible substitution): Payer: BC Managed Care – PPO | Admitting: Neurology

## 2020-02-25 ENCOUNTER — Other Ambulatory Visit: Payer: Self-pay | Admitting: Family Medicine

## 2020-02-25 DIAGNOSIS — M5030 Other cervical disc degeneration, unspecified cervical region: Secondary | ICD-10-CM

## 2020-02-25 NOTE — Telephone Encounter (Signed)
Requested medications are due for refill today.  yes  Requested medications are on the active medications list.  yes  Last refill. 01/21/2020  Future visit scheduled.   yes  Notes to clinic.  Not delegated

## 2020-03-05 ENCOUNTER — Telehealth (INDEPENDENT_AMBULATORY_CARE_PROVIDER_SITE_OTHER): Payer: BC Managed Care – PPO | Admitting: Family Medicine

## 2020-03-05 ENCOUNTER — Encounter: Payer: Self-pay | Admitting: Family Medicine

## 2020-03-05 ENCOUNTER — Other Ambulatory Visit: Payer: Self-pay

## 2020-03-05 DIAGNOSIS — F902 Attention-deficit hyperactivity disorder, combined type: Secondary | ICD-10-CM

## 2020-03-05 DIAGNOSIS — G894 Chronic pain syndrome: Secondary | ICD-10-CM | POA: Diagnosis not present

## 2020-03-05 DIAGNOSIS — M5030 Other cervical disc degeneration, unspecified cervical region: Secondary | ICD-10-CM

## 2020-03-05 DIAGNOSIS — I10 Essential (primary) hypertension: Secondary | ICD-10-CM | POA: Diagnosis not present

## 2020-03-05 DIAGNOSIS — Z9884 Bariatric surgery status: Secondary | ICD-10-CM

## 2020-03-05 DIAGNOSIS — E039 Hypothyroidism, unspecified: Secondary | ICD-10-CM | POA: Diagnosis not present

## 2020-03-05 DIAGNOSIS — E559 Vitamin D deficiency, unspecified: Secondary | ICD-10-CM

## 2020-03-05 MED ORDER — DULOXETINE HCL 60 MG PO CPEP: 60.0000 mg | ORAL_CAPSULE | Freq: Every day | ORAL | 1 refills | Status: DC

## 2020-03-05 MED ORDER — CYCLOBENZAPRINE HCL 5 MG PO TABS: 5.0000 mg | ORAL_TABLET | Freq: Every day | ORAL | 0 refills | Status: DC

## 2020-03-05 MED ORDER — POTASSIUM CHLORIDE CRYS ER 20 MEQ PO TBCR: 20.0000 meq | EXTENDED_RELEASE_TABLET | Freq: Once | ORAL | 3 refills | Status: DC
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
## 2020-03-05 NOTE — Patient Instructions (Addendum)
  Health Maintenance, Female Adopting a healthy lifestyle and getting preventive care are important in promoting health and wellness. Ask your health care provider about:  The right schedule for you to have regular tests and exams.  Things you can do on your own to prevent diseases and keep yourself healthy. What should I know about diet, weight, and exercise? Eat a healthy diet  Eat a diet that includes plenty of vegetables, fruits, low-fat dairy products, and lean protein.  Do not eat a lot of foods that are high in solid fats, added sugars, or sodium.   Maintain a healthy weight Body mass index (BMI) is used to identify weight problems. It estimates body fat based on height and weight. Your health care provider can help determine your BMI and help you achieve or maintain a healthy weight. Get regular exercise Get regular exercise. This is one of the most important things you can do for your health. Most adults should:  Exercise for at least 150 minutes each week. The exercise should increase your heart rate and make you sweat (moderate-intensity exercise).  Do strengthening exercises at least twice a week. This is in addition to the moderate-intensity exercise.  Spend less time sitting. Even light physical activity can be beneficial. Watch cholesterol and blood lipids Have your blood tested for lipids and cholesterol at 45 years of age, then have this test every 5 years. Have your cholesterol levels checked more often if:  Your lipid or cholesterol levels are high.  You are older than 45 years of age.  You are at high risk for heart disease. What should I know about cancer screening? Depending on your health history and family history, you may need to have cancer screening at various ages. This may include screening for:  Breast cancer.  Cervical cancer.  Colorectal cancer.  Skin cancer.  Lung cancer. What should I know about heart disease, diabetes, and high blood  pressure? Blood pressure and heart disease  High blood pressure causes heart disease and increases the risk of stroke. This is more likely to develop in people who have high blood pressure readings, are of African descent, or are overweight.  Have your blood pressure checked: ? Every 3-5 years if you are 18-39 years of age. ? Every year if you are 40 years old or older. Diabetes Have regular diabetes screenings. This checks your fasting blood sugar level. Have the screening done:  Once every three years after age 40 if you are at a normal weight and have a low risk for diabetes.  More often and at a younger age if you are overweight or have a high risk for diabetes. What should I know about preventing infection? Hepatitis B If you have a higher risk for hepatitis B, you should be screened for this virus. Talk with your health care provider to find out if you are at risk for hepatitis B infection. Hepatitis C Testing is recommended for:  Everyone born from 1945 through 1965.  Anyone with known risk factors for hepatitis C. Sexually transmitted infections (STIs)  Get screened for STIs, including gonorrhea and chlamydia, if: ? You are sexually active and are younger than 45 years of age. ? You are older than 45 years of age and your health care provider tells you that you are at risk for this type of infection. ? Your sexual activity has changed since you were last screened, and you are at increased risk for chlamydia or gonorrhea. Ask your health   care provider if you are at risk.  Ask your health care provider about whether you are at high risk for HIV. Your health care provider may recommend a prescription medicine to help prevent HIV infection. If you choose to take medicine to prevent HIV, you should first get tested for HIV. You should then be tested every 3 months for as long as you are taking the medicine. Pregnancy  If you are about to stop having your period (premenopausal) and  you may become pregnant, seek counseling before you get pregnant.  Take 400 to 800 micrograms (mcg) of folic acid every day if you become pregnant.  Ask for birth control (contraception) if you want to prevent pregnancy. Osteoporosis and menopause Osteoporosis is a disease in which the bones lose minerals and strength with aging. This can result in bone fractures. If you are 65 years old or older, or if you are at risk for osteoporosis and fractures, ask your health care provider if you should:  Be screened for bone loss.  Take a calcium or vitamin D supplement to lower your risk of fractures.  Be given hormone replacement therapy (HRT) to treat symptoms of menopause. Follow these instructions at home: Lifestyle  Do not use any products that contain nicotine or tobacco, such as cigarettes, e-cigarettes, and chewing tobacco. If you need help quitting, ask your health care provider.  Do not use street drugs.  Do not share needles.  Ask your health care provider for help if you need support or information about quitting drugs. Alcohol use  Do not drink alcohol if: ? Your health care provider tells you not to drink. ? You are pregnant, may be pregnant, or are planning to become pregnant.  If you drink alcohol: ? Limit how much you use to 0-1 drink a day. ? Limit intake if you are breastfeeding.  Be aware of how much alcohol is in your drink. In the [place] one drink equals one 12 oz bottle of beer (355 mL), one 5 oz glass of wine (148 mL), or one 1 oz glass of hard liquor (44 mL). General instructions  Schedule regular health, dental, and eye exams.  Stay current with your vaccines.  Tell your health care provider if: ? You often feel depressed. ? You have ever been abused or do not feel safe at home. Summary  Adopting a healthy lifestyle and getting preventive care are important in promoting health and wellness.  Follow your health care provider's instructions about healthy  diet, exercising, and getting tested or screened for diseases.  Follow your health care provider's instructions on monitoring your cholesterol and blood pressure. This information is not intended to replace advice given to you by your health care provider. Make sure you discuss any questions you have with your health care provider. Document Revised: 01/03/2018 Document Reviewed: 01/03/2018 Elsevier Patient Education  2021 Elsevier Inc.   If you have lab work done today you will be contacted with your lab results within the next 2 weeks.  If you have not heard from us then please contact us. The fastest way to get your results is to register for My Chart.   IF you received an x-ray today, you will receive an invoice from [facility]. Please contact [facility] at [phone] with questions or concerns regarding your invoice.   IF you received labwork today, you will receive an invoice from [facility]. Please contact [facility] at [phone] with questions or concerns regarding your invoice.   Our billing   staff will not be able to assist you with questions regarding bills from these companies.  You will be contacted with the lab results as soon as they are available. The fastest way to get your results is to activate your My Chart account. Instructions are located on the last page of this paperwork. If you have not heard from us regarding the results in 2 weeks, please contact this office.      

## 2020-03-16 DIAGNOSIS — M542 Cervicalgia: Secondary | ICD-10-CM | POA: Diagnosis not present

## 2020-03-16 DIAGNOSIS — Z79891 Long term (current) use of opiate analgesic: Secondary | ICD-10-CM | POA: Diagnosis not present

## 2020-03-16 DIAGNOSIS — G5601 Carpal tunnel syndrome, right upper limb: Secondary | ICD-10-CM | POA: Diagnosis not present

## 2020-03-16 DIAGNOSIS — G894 Chronic pain syndrome: Secondary | ICD-10-CM | POA: Diagnosis not present

## 2020-03-16 DIAGNOSIS — Z79899 Other long term (current) drug therapy: Secondary | ICD-10-CM | POA: Diagnosis not present

## 2020-03-16 DIAGNOSIS — M4722 Other spondylosis with radiculopathy, cervical region: Secondary | ICD-10-CM | POA: Diagnosis not present

## 2020-03-18 ENCOUNTER — Other Ambulatory Visit: Payer: Self-pay | Admitting: Pain Medicine

## 2020-03-18 DIAGNOSIS — M79601 Pain in right arm: Secondary | ICD-10-CM

## 2020-03-18 DIAGNOSIS — M542 Cervicalgia: Secondary | ICD-10-CM

## 2020-03-18 DIAGNOSIS — R200 Anesthesia of skin: Secondary | ICD-10-CM

## 2020-03-25 ENCOUNTER — Other Ambulatory Visit: Payer: Self-pay | Admitting: Pain Medicine

## 2020-03-25 DIAGNOSIS — Z0000 Encounter for general adult medical examination without abnormal findings: Secondary | ICD-10-CM

## 2020-03-26 ENCOUNTER — Encounter: Payer: Self-pay | Admitting: Registered Nurse
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
## 2020-03-30 ENCOUNTER — Other Ambulatory Visit: Payer: Self-pay | Admitting: Family Medicine

## 2020-03-30 DIAGNOSIS — M5030 Other cervical disc degeneration, unspecified cervical region: Secondary | ICD-10-CM

## 2020-03-30 NOTE — Telephone Encounter (Signed)
Requested medication (s) are due for refill today: yes  Requested medication (s) are on the active medication list: yes  Last refill: 02/26/2020  Future visit scheduled:  no  Notes to clinic:  this refill cannot be delegated    Requested Prescriptions  Pending Prescriptions Disp Refills   baclofen (LIORESAL) 10 MG tablet [Pharmacy Med Name: BACLOFEN 10MG  TABLETS] 90 tablet 0    Sig: TAKE 1 TABLET(10 MG) BY MOUTH THREE TIMES DAILY AS NEEDED FOR MUSCLE SPASMS      Not Delegated - Analgesics:  Muscle Relaxants Failed - 03/30/2020  6:20 AM      Failed - This refill cannot be delegated      Passed - Valid encounter within last 6 months    Recent Outpatient Visits           3 weeks ago Primary hypertension   [facility], [place], FNP   1 month ago Rhinorrhea   [facility] [place], FNP   2 months ago Allergy, subsequent encounter   [facility], [facility], FNP   3 months ago DDD (degenerative disc disease), cervical   [facility], [place], NP   10 months ago Paronychia of finger of left hand   [facility] at [facility], [facility], MD

## 2020-04-01 ENCOUNTER — Ambulatory Visit: Payer: BC Managed Care – PPO

## 2020-04-10 ENCOUNTER — Other Ambulatory Visit: Payer: Self-pay

## 2020-04-10 ENCOUNTER — Ambulatory Visit
Admission: RE | Admit: 2020-04-10 | Discharge: 2020-04-10 | Disposition: A | Discharge status: Home / Self Care | Payer: BC Managed Care – PPO | Source: Ambulatory Visit | Attending: Pain Medicine | Admitting: Pain Medicine

## 2020-04-10 DIAGNOSIS — M4802 Spinal stenosis, cervical region: Secondary | ICD-10-CM | POA: Diagnosis not present

## 2020-04-10 DIAGNOSIS — M542 Cervicalgia: Secondary | ICD-10-CM

## 2020-04-10 DIAGNOSIS — M79601 Pain in right arm: Secondary | ICD-10-CM

## 2020-04-10 DIAGNOSIS — R200 Anesthesia of skin: Secondary | ICD-10-CM

## 2020-04-22 DIAGNOSIS — F3176 Bipolar disorder, in full remission, most recent episode depressed: Secondary | ICD-10-CM | POA: Diagnosis not present

## 2020-04-22 DIAGNOSIS — F3174 Bipolar disorder, in full remission, most recent episode manic: Secondary | ICD-10-CM | POA: Diagnosis not present

## 2020-04-22 DIAGNOSIS — F410 Panic disorder [episodic paroxysmal anxiety] without agoraphobia: Secondary | ICD-10-CM | POA: Diagnosis not present

## 2020-04-22 DIAGNOSIS — F900 Attention-deficit hyperactivity disorder, predominantly inattentive type: Secondary | ICD-10-CM | POA: Diagnosis not present

## 2020-04-23 ENCOUNTER — Ambulatory Visit: Payer: BC Managed Care – PPO

## 2020-04-29 ENCOUNTER — Other Ambulatory Visit: Payer: Self-pay | Admitting: Family Medicine

## 2020-04-29 DIAGNOSIS — G5601 Carpal tunnel syndrome, right upper limb: Secondary | ICD-10-CM | POA: Diagnosis not present

## 2020-04-29 DIAGNOSIS — Z79899 Other long term (current) drug therapy: Secondary | ICD-10-CM | POA: Diagnosis not present

## 2020-04-29 DIAGNOSIS — M79643 Pain in unspecified hand: Secondary | ICD-10-CM | POA: Diagnosis not present

## 2020-04-29 DIAGNOSIS — Z79891 Long term (current) use of opiate analgesic: Secondary | ICD-10-CM | POA: Diagnosis not present

## 2020-04-29 DIAGNOSIS — M4722 Other spondylosis with radiculopathy, cervical region: Secondary | ICD-10-CM | POA: Diagnosis not present

## 2020-04-29 DIAGNOSIS — M5030 Other cervical disc degeneration, unspecified cervical region: Secondary | ICD-10-CM

## 2020-04-29 DIAGNOSIS — G894 Chronic pain syndrome: Secondary | ICD-10-CM | POA: Diagnosis not present

## 2020-05-18 ENCOUNTER — Ambulatory Visit: Payer: BC Managed Care – PPO

## 2020-06-08 DIAGNOSIS — G894 Chronic pain syndrome: Secondary | ICD-10-CM | POA: Diagnosis not present

## 2020-06-08 DIAGNOSIS — M542 Cervicalgia: Secondary | ICD-10-CM | POA: Diagnosis not present

## 2020-06-08 DIAGNOSIS — M4722 Other spondylosis with radiculopathy, cervical region: Secondary | ICD-10-CM | POA: Diagnosis not present

## 2020-06-08 DIAGNOSIS — G5601 Carpal tunnel syndrome, right upper limb: Secondary | ICD-10-CM | POA: Diagnosis not present

## 2020-06-30 ENCOUNTER — Other Ambulatory Visit: Payer: Self-pay | Admitting: Emergency Medicine

## 2020-06-30 DIAGNOSIS — G5603 Carpal tunnel syndrome, bilateral upper limbs: Secondary | ICD-10-CM | POA: Diagnosis not present

## 2020-07-08 DIAGNOSIS — G5601 Carpal tunnel syndrome, right upper limb: Secondary | ICD-10-CM | POA: Diagnosis not present

## 2020-07-08 DIAGNOSIS — G894 Chronic pain syndrome: Secondary | ICD-10-CM | POA: Diagnosis not present

## 2020-07-08 DIAGNOSIS — M542 Cervicalgia: Secondary | ICD-10-CM | POA: Diagnosis not present

## 2020-07-08 DIAGNOSIS — M4722 Other spondylosis with radiculopathy, cervical region: Secondary | ICD-10-CM | POA: Diagnosis not present

## 2020-07-23 IMAGING — DX DG KNEE COMPLETE 4+V*R*
4 series · 4 of 4 positions shown · non-contrast
Comparison: None.

CLINICAL DATA: Right knee pain and swelling following recent fall

EXAM:
RIGHT KNEE - COMPLETE 4+ VIEW

[knee ap]
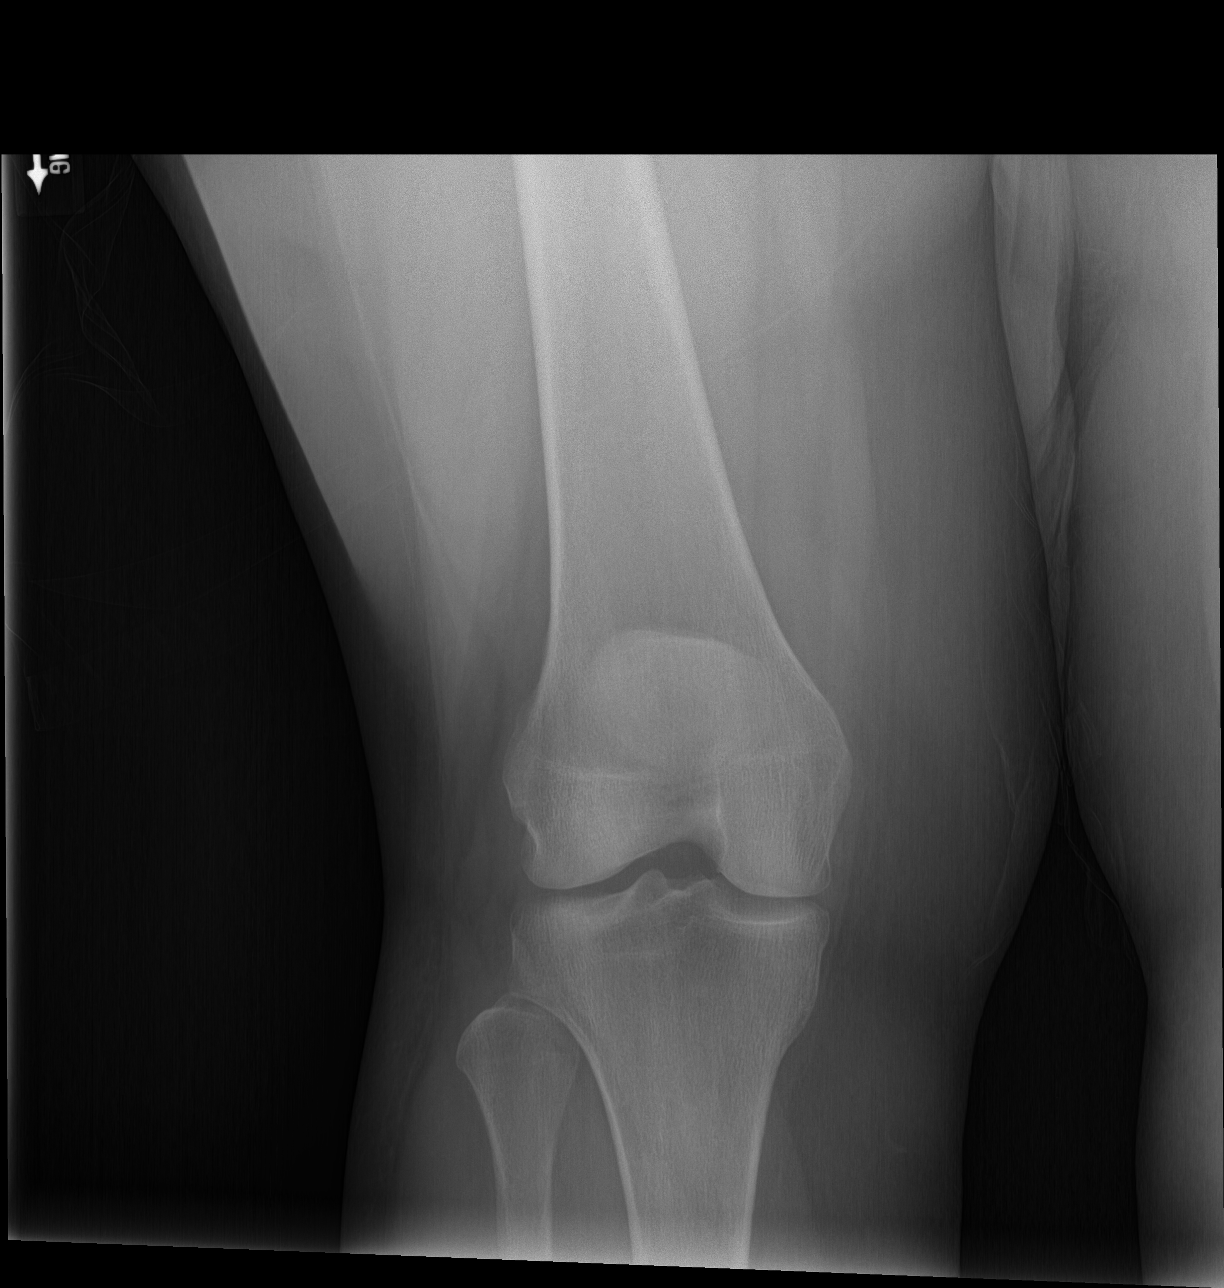

[knee lat]
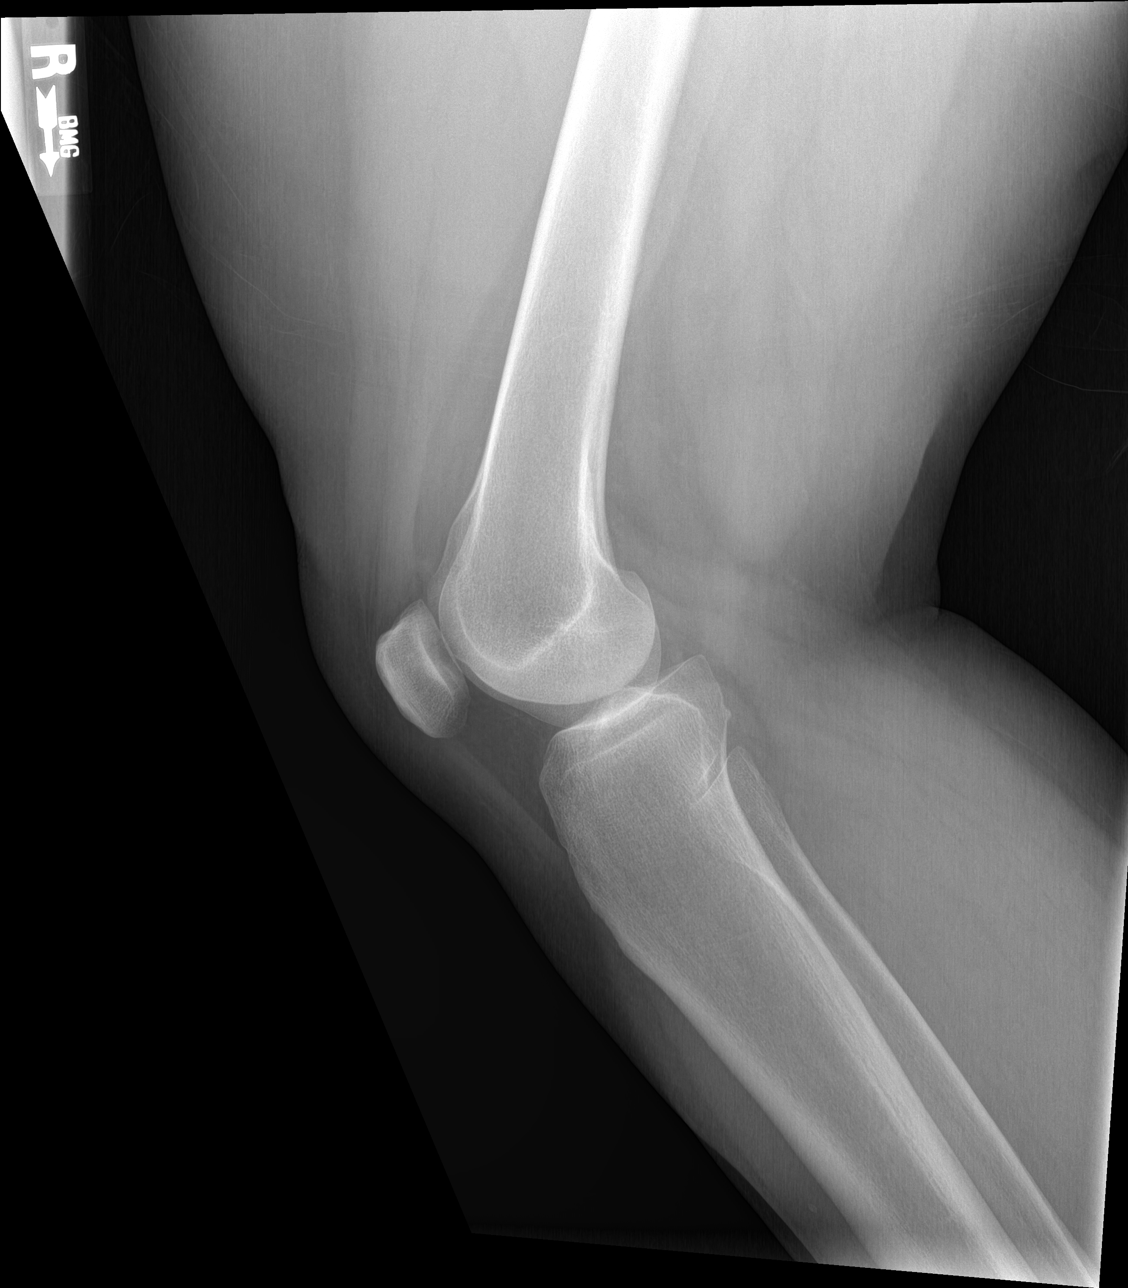

[sunrise]
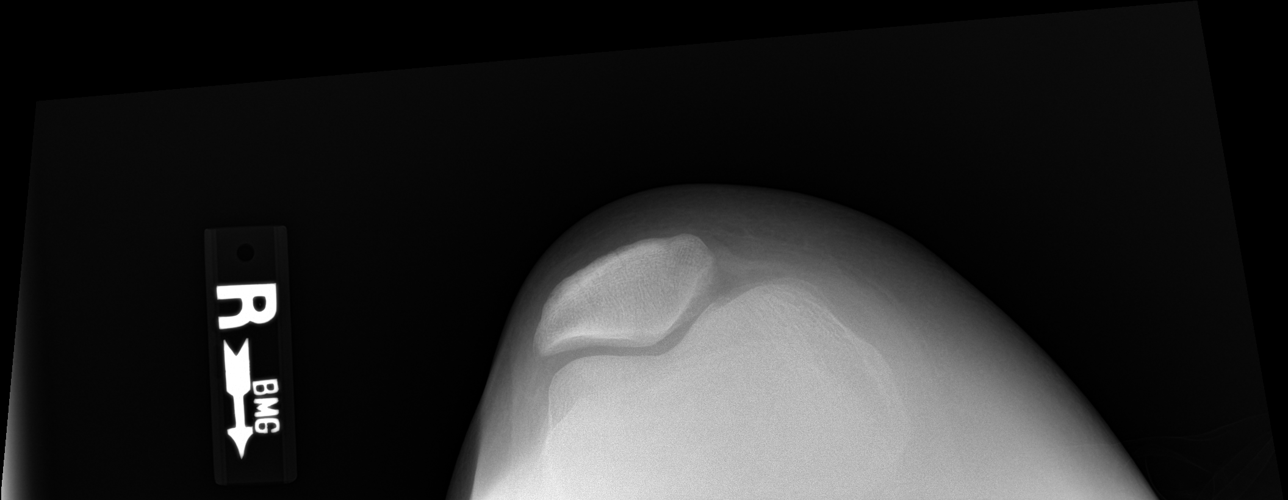

[knee [person_name]]
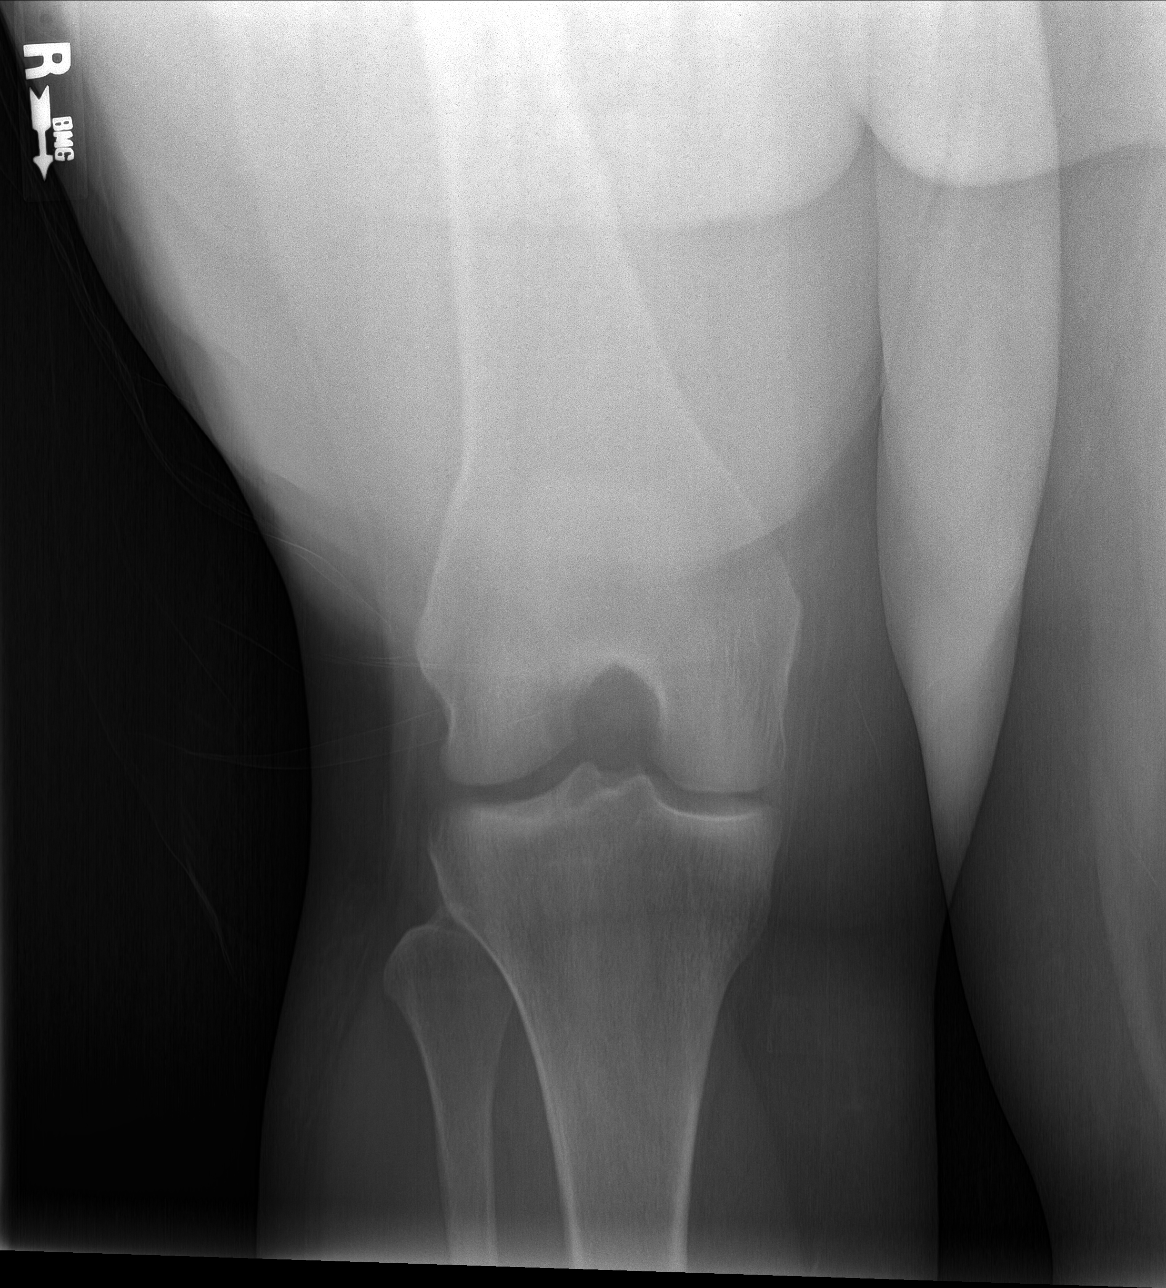

[4 of 4 positions shown; findings below may reference images not displayed]

FINDINGS: No evidence of fracture, dislocation, or joint effusion. No evidence
of arthropathy or other focal bone abnormality. Soft tissues are
unremarkable.
IMPRESSION: No acute abnormality noted.

## 2020-07-24 DIAGNOSIS — H538 Other visual disturbances: Secondary | ICD-10-CM | POA: Diagnosis not present

## 2020-07-25 DIAGNOSIS — H10013 Acute follicular conjunctivitis, bilateral: Secondary | ICD-10-CM | POA: Diagnosis not present

## 2020-08-08 ENCOUNTER — Emergency Department (HOSPITAL_BASED_OUTPATIENT_CLINIC_OR_DEPARTMENT_OTHER)
Admission: EM | Admit: 2020-08-08 | Discharge: 2020-08-08 | Disposition: A | Discharge status: Home / Self Care | Payer: BC Managed Care – PPO | Attending: Emergency Medicine | Admitting: Emergency Medicine

## 2020-08-08 ENCOUNTER — Encounter (HOSPITAL_BASED_OUTPATIENT_CLINIC_OR_DEPARTMENT_OTHER): Payer: Self-pay

## 2020-08-08 DIAGNOSIS — E039 Hypothyroidism, unspecified: Secondary | ICD-10-CM | POA: Insufficient documentation

## 2020-08-08 DIAGNOSIS — Z91040 Latex allergy status: Secondary | ICD-10-CM | POA: Insufficient documentation

## 2020-08-08 DIAGNOSIS — Y92832 Beach as the place of occurrence of the external cause: Secondary | ICD-10-CM | POA: Diagnosis not present

## 2020-08-08 DIAGNOSIS — I10 Essential (primary) hypertension: Secondary | ICD-10-CM | POA: Diagnosis not present

## 2020-08-08 DIAGNOSIS — Z79899 Other long term (current) drug therapy: Secondary | ICD-10-CM | POA: Diagnosis not present

## 2020-08-08 DIAGNOSIS — S0501XA Injury of conjunctiva and corneal abrasion without foreign body, right eye, initial encounter: Secondary | ICD-10-CM | POA: Insufficient documentation

## 2020-08-08 DIAGNOSIS — S0502XA Injury of conjunctiva and corneal abrasion without foreign body, left eye, initial encounter: Secondary | ICD-10-CM | POA: Diagnosis not present

## 2020-08-08 DIAGNOSIS — X58XXXA Exposure to other specified factors, initial encounter: Secondary | ICD-10-CM | POA: Insufficient documentation

## 2020-08-08 DIAGNOSIS — S0592XA Unspecified injury of left eye and orbit, initial encounter: Secondary | ICD-10-CM | POA: Diagnosis not present

## 2020-08-08 DIAGNOSIS — Z87891 Personal history of nicotine dependence: Secondary | ICD-10-CM | POA: Diagnosis not present

## 2020-08-08 LAB — COMPREHENSIVE METABOLIC PANEL
ALT: 47 U/L — ABNORMAL HIGH (ref 0–44)
AST: 73 U/L — ABNORMAL HIGH (ref 15–41)
Albumin: 3.9 g/dL (ref 3.5–5.0)
Alkaline Phosphatase: 66 U/L (ref 38–126)
Anion gap: 5 (ref 5–15)
BUN: 15 mg/dL (ref 6–20)
CO2: 26 mmol/L (ref 22–32)
Calcium: 8.1 mg/dL — ABNORMAL LOW (ref 8.9–10.3)
Chloride: 108 mmol/L (ref 98–111)
Creatinine, Ser: 1.07 mg/dL — ABNORMAL HIGH (ref 0.44–1.00)
GFR, Estimated: >60 mL/min (ref >60)
Glucose, Bld: 73 mg/dL (ref 70–99)
Potassium: 3.6 mmol/L (ref 3.5–5.1)
Sodium: 139 mmol/L (ref 135–145)
Total Bilirubin: 0.8 mg/dL (ref 0.3–1.2)
Total Protein: 5.9 g/dL — ABNORMAL LOW (ref 6.5–8.1)

## 2020-08-08 LAB — CBC
HCT: 33.2 % — ABNORMAL LOW (ref 36.0–46.0)
Hemoglobin: 10.6 g/dL — ABNORMAL LOW (ref 12.0–15.0)
MCH: 32.1 pg (ref 26.0–34.0)
MCHC: 31.9 g/dL (ref 30.0–36.0)
MCV: 100.6 fL — ABNORMAL HIGH (ref 80.0–100.0)
Platelets: 135 10*3/uL — ABNORMAL LOW (ref 150–400)
RBC: 3.3 MIL/uL — ABNORMAL LOW (ref 3.87–5.11)
RDW: 15.1 % (ref 11.5–15.5)
WBC: 9.6 10*3/uL (ref 4.0–10.5)
nRBC: 0 % (ref 0.0–0.2)

## 2020-08-08 MED ORDER — FLUORESCEIN SODIUM 1 MG OP STRP
ORAL_STRIP | OPHTHALMIC | Status: AC
  Administered 2020-08-08: 1
  Filled 2020-08-08: qty 2

## 2020-08-08 MED ORDER — ERYTHROMYCIN 5 MG/GM OP OINT
1.0000 "application " | TOPICAL_OINTMENT | Freq: Once | OPHTHALMIC | Status: AC
  Administered 2020-08-08: 1 via OPHTHALMIC
  Filled 2020-08-08: qty 3.5

## 2020-08-08 MED ORDER — TETRACAINE HCL 0.5 % OP SOLN
2.0000 [drp] | Freq: Once | OPHTHALMIC | Status: AC
  Administered 2020-08-08: 2 [drp] via OPHTHALMIC
  Filled 2020-08-08: qty 4

## 2020-08-08 NOTE — ED Notes (Signed)
Reached Dr Bonnye Fava [id number]

## 2020-08-08 NOTE — Discharge Instructions (Addendum)
Please do not use your contact lenses until cleared by ophthalmology, you have been cleared to start erythromycin ointment, please do not use the drops that they gave you in the past anymore.  Do not wear contact lenses until cleared by your eye doctor.  He may be seen at 8:00 in the morning on Monday at Dr. Laruth Bouchard office, see the address and phone number above  Please use the erythromycin ointment every 6 hours

## 2020-08-08 NOTE — ED Triage Notes (Signed)
She is here with c/o blurry and "abnormal" vision in both eyes. She states that she was exposed to [place] the beach in [place] of this year, "and I've had problems with my eyes ever since". She is ambulatory and in no distress.
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
## 2020-08-10 ENCOUNTER — Telehealth: Payer: Self-pay | Admitting: Family Medicine

## 2020-08-10 NOTE — Telephone Encounter (Signed)
[facility] stating the patient may have severe vitamin A deficiency. She wanted the patient to be seen by the provider but did not know she was a brand new patient.   Both suggested for the patient to go to Urgent Care ASAP to get checked out before returning to [facility].

## 2020-08-11 NOTE — Telephone Encounter (Signed)
Called pt, Renee [name].   

## 2020-08-14 DIAGNOSIS — H547 Unspecified visual loss: Secondary | ICD-10-CM | POA: Diagnosis not present

## 2020-08-17 DIAGNOSIS — E507 Other ocular manifestations of vitamin A deficiency: Secondary | ICD-10-CM | POA: Diagnosis not present

## 2020-08-17 DIAGNOSIS — H04123 Dry eye syndrome of bilateral lacrimal glands: Secondary | ICD-10-CM | POA: Diagnosis not present

## 2020-08-17 DIAGNOSIS — H02054 Trichiasis without entropian left upper eyelid: Secondary | ICD-10-CM | POA: Diagnosis not present

## 2020-08-17 DIAGNOSIS — H11143 Conjunctival xerosis, unspecified, bilateral: Secondary | ICD-10-CM | POA: Diagnosis not present

## 2020-08-19 DIAGNOSIS — Z79891 Long term (current) use of opiate analgesic: Secondary | ICD-10-CM | POA: Diagnosis not present

## 2020-08-19 DIAGNOSIS — G894 Chronic pain syndrome: Secondary | ICD-10-CM | POA: Diagnosis not present

## 2020-08-19 DIAGNOSIS — Z79899 Other long term (current) drug therapy: Secondary | ICD-10-CM | POA: Diagnosis not present

## 2020-08-21 ENCOUNTER — Ambulatory Visit (INDEPENDENT_AMBULATORY_CARE_PROVIDER_SITE_OTHER): Payer: BC Managed Care – PPO | Admitting: Internal Medicine

## 2020-08-21 ENCOUNTER — Encounter: Payer: Self-pay | Admitting: Internal Medicine

## 2020-08-21 ENCOUNTER — Other Ambulatory Visit: Payer: Self-pay

## 2020-08-21 VITALS — BP 108/76 | HR 86 | Resp 18 | Ht 68.0 in | Wt 186.0 lb

## 2020-08-21 DIAGNOSIS — E039 Hypothyroidism, unspecified: Secondary | ICD-10-CM | POA: Diagnosis not present

## 2020-08-21 DIAGNOSIS — G894 Chronic pain syndrome: Secondary | ICD-10-CM

## 2020-08-21 DIAGNOSIS — Z131 Encounter for screening for diabetes mellitus: Secondary | ICD-10-CM

## 2020-08-21 DIAGNOSIS — I10 Essential (primary) hypertension: Secondary | ICD-10-CM

## 2020-08-21 DIAGNOSIS — Z7689 Persons encountering health services in other specified circumstances: Secondary | ICD-10-CM | POA: Diagnosis not present

## 2020-08-21 DIAGNOSIS — F3181 Bipolar II disorder: Secondary | ICD-10-CM

## 2020-08-21 DIAGNOSIS — R110 Nausea: Secondary | ICD-10-CM

## 2020-08-21 DIAGNOSIS — M5030 Other cervical disc degeneration, unspecified cervical region: Secondary | ICD-10-CM

## 2020-08-21 DIAGNOSIS — E559 Vitamin D deficiency, unspecified: Secondary | ICD-10-CM

## 2020-08-21 DIAGNOSIS — F988 Other specified behavioral and emotional disorders with onset usually occurring in childhood and adolescence: Secondary | ICD-10-CM

## 2020-08-21 DIAGNOSIS — Z9884 Bariatric surgery status: Secondary | ICD-10-CM

## 2020-08-21 DIAGNOSIS — Z720 Tobacco use: Secondary | ICD-10-CM

## 2020-08-21 DIAGNOSIS — Z124 Encounter for screening for malignant neoplasm of cervix: Secondary | ICD-10-CM

## 2020-08-21 DIAGNOSIS — D649 Anemia, unspecified: Secondary | ICD-10-CM

## 2020-08-21 MED ORDER — ONDANSETRON 8 MG PO TBDP: 8.0000 mg | ORAL_TABLET | Freq: Three times a day (TID) | ORAL | 0 refills | Status: DC | PRN

## 2020-08-21 MED ORDER — DULOXETINE HCL 60 MG PO CPEP: 60.0000 mg | ORAL_CAPSULE | Freq: Every day | ORAL | 1 refills | Status: DC

## 2020-08-21 MED ORDER — PANTOPRAZOLE SODIUM 40 MG PO TBEC: 40.0000 mg | DELAYED_RELEASE_TABLET | Freq: Every day | ORAL | 3 refills | Status: DC

## 2020-08-21 NOTE — Assessment & Plan Note (Signed)
On Latuda, followed by Psychiatry - Dr Evelene Croon

## 2020-08-21 NOTE — Assessment & Plan Note (Signed)
From Cervical spinal stenosis, on Cymbalta and chronic opioids - followed by Spine Surgery/Pain management

## 2020-08-21 NOTE — Patient Instructions (Signed)
Please continue taking medications as prescribed.  Please get fasting blood tests done within a week.  Please bring medications in the next visit.  Please continue to follow low salt and low cholesterol diet and ambulate as tolerated.

## 2020-08-21 NOTE — Assessment & Plan Note (Signed)
Care established History and medications reviewed with the patient 

## 2020-08-21 NOTE — Assessment & Plan Note (Signed)
On Adderall, followed by Psychiatry
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
## 2020-08-21 NOTE — Assessment & Plan Note (Addendum)
BP Readings from Last 1 Encounters:  08/21/20 108/76   Well-controlled with Maxzide and Chlorthalidone Counseled for compliance with the medications Advised DASH diet and moderate exercise/walking, at least 150 mins/week

## 2020-08-21 NOTE — Assessment & Plan Note (Signed)
Smokes about 0.25 pack/day  Asked about quitting: confirms that she currently smokes cigarettes Advise to quit smoking: Educated about QUITTING to reduce the risk of cancer, cardio and cerebrovascular disease. Assess willingness: Unwilling to quit at this time, but is working on cutting back. Assist with counseling and pharmacotherapy: Counseled for 5 minutes and literature provided. Arrange for follow up: follow up in 3 months and continue to offer help.

## 2020-08-21 NOTE — Assessment & Plan Note (Signed)
On chronic opioid medications, followed by Spine surgery

## 2020-08-21 NOTE — Assessment & Plan Note (Signed)
Lab Results  Component Value Date   TSH 45.200 (H) 08/09/2019   Noncompliant Check TSH and Free T4, adjust dose accordingly

## 2020-09-16 DIAGNOSIS — M542 Cervicalgia: Secondary | ICD-10-CM | POA: Diagnosis not present

## 2020-09-16 DIAGNOSIS — G5601 Carpal tunnel syndrome, right upper limb: Secondary | ICD-10-CM | POA: Diagnosis not present

## 2020-09-16 DIAGNOSIS — M4722 Other spondylosis with radiculopathy, cervical region: Secondary | ICD-10-CM | POA: Diagnosis not present

## 2020-09-16 DIAGNOSIS — G894 Chronic pain syndrome: Secondary | ICD-10-CM | POA: Diagnosis not present

## 2020-10-08 DIAGNOSIS — G5603 Carpal tunnel syndrome, bilateral upper limbs: Secondary | ICD-10-CM | POA: Diagnosis not present

## 2020-10-09 ENCOUNTER — Ambulatory Visit: Payer: BC Managed Care – PPO | Admitting: Internal Medicine

## 2020-10-10 DIAGNOSIS — F900 Attention-deficit hyperactivity disorder, predominantly inattentive type: Secondary | ICD-10-CM | POA: Diagnosis not present

## 2020-10-10 DIAGNOSIS — F3132 Bipolar disorder, current episode depressed, moderate: Secondary | ICD-10-CM | POA: Diagnosis not present

## 2020-11-02 ENCOUNTER — Ambulatory Visit: Payer: BC Managed Care – PPO | Admitting: Internal Medicine

## 2020-11-09 DIAGNOSIS — G5601 Carpal tunnel syndrome, right upper limb: Secondary | ICD-10-CM | POA: Diagnosis not present

## 2020-11-09 DIAGNOSIS — Z79899 Other long term (current) drug therapy: Secondary | ICD-10-CM | POA: Diagnosis not present

## 2020-11-09 DIAGNOSIS — Z79891 Long term (current) use of opiate analgesic: Secondary | ICD-10-CM | POA: Diagnosis not present

## 2020-11-09 DIAGNOSIS — G894 Chronic pain syndrome: Secondary | ICD-10-CM | POA: Diagnosis not present

## 2020-11-09 DIAGNOSIS — M4722 Other spondylosis with radiculopathy, cervical region: Secondary | ICD-10-CM | POA: Diagnosis not present

## 2020-11-17 ENCOUNTER — Other Ambulatory Visit: Payer: Self-pay | Admitting: Emergency Medicine

## 2021-02-03 ENCOUNTER — Ambulatory Visit: Payer: BC Managed Care – PPO | Admitting: Internal Medicine

## 2021-02-10 ENCOUNTER — Other Ambulatory Visit: Payer: Self-pay

## 2021-02-10 ENCOUNTER — Emergency Department (HOSPITAL_BASED_OUTPATIENT_CLINIC_OR_DEPARTMENT_OTHER): Payer: BC Managed Care – PPO | Admitting: Radiology

## 2021-02-10 ENCOUNTER — Emergency Department (HOSPITAL_BASED_OUTPATIENT_CLINIC_OR_DEPARTMENT_OTHER)
Admission: EM | Admit: 2021-02-10 | Discharge: 2021-02-10 | Disposition: A | Discharge status: Home / Self Care | Payer: BC Managed Care – PPO | Attending: Emergency Medicine | Admitting: Emergency Medicine

## 2021-02-10 ENCOUNTER — Encounter (HOSPITAL_BASED_OUTPATIENT_CLINIC_OR_DEPARTMENT_OTHER): Payer: Self-pay

## 2021-02-10 DIAGNOSIS — R0981 Nasal congestion: Secondary | ICD-10-CM | POA: Diagnosis not present

## 2021-02-10 DIAGNOSIS — R059 Cough, unspecified: Secondary | ICD-10-CM | POA: Diagnosis not present

## 2021-02-10 DIAGNOSIS — Z20822 Contact with and (suspected) exposure to covid-19: Secondary | ICD-10-CM | POA: Diagnosis not present

## 2021-02-10 DIAGNOSIS — J189 Pneumonia, unspecified organism: Secondary | ICD-10-CM

## 2021-02-10 DIAGNOSIS — Z91040 Latex allergy status: Secondary | ICD-10-CM | POA: Diagnosis not present

## 2021-02-10 DIAGNOSIS — J181 Lobar pneumonia, unspecified organism: Secondary | ICD-10-CM | POA: Insufficient documentation

## 2021-02-10 DIAGNOSIS — R0602 Shortness of breath: Secondary | ICD-10-CM | POA: Diagnosis not present

## 2021-02-10 LAB — RESP PANEL BY RT-PCR (FLU A&B, COVID) ARPGX2
Influenza A by PCR: NEGATIVE
Influenza B by PCR: NEGATIVE
SARS Coronavirus 2 by RT PCR: NEGATIVE

## 2021-02-10 MED ORDER — DOXYCYCLINE HYCLATE 100 MG PO CAPS: 100.0000 mg | ORAL_CAPSULE | Freq: Two times a day (BID) | ORAL | 0 refills | Status: DC

## 2021-02-10 MED ORDER — PREDNISONE 50 MG PO TABS: ORAL_TABLET | ORAL | 0 refills | Status: DC

## 2021-02-10 NOTE — ED Notes (Signed)
Pt. States that she has to be selective on the type of tests and opted not to have blood work done.

## 2021-02-10 NOTE — ED Provider Notes (Signed)
[facility] EMERGENCY DEPT Provider Note   CSN: [id number] Arrival date & time: 02/10/21  1125     History  Chief Complaint  Patient presents with   URI    Renee [name] is a 46 y.o. female.  The history is provided by the patient. No language interpreter was used.  URI Presenting symptoms: congestion and cough   Severity:  Moderate Onset quality:  Gradual Duration:  3 weeks Timing:  Constant Progression:  Worsening Chronicity:  New Relieved by:  Nothing Worsened by:  Nothing Ineffective treatments:  None tried Associated symptoms: wheezing   Associated symptoms: no headaches   Risk factors: no sick contacts       Home Medications Prior to Admission medications   Medication Sig Start Date End Date Taking? Authorizing Provider  amphetamine-dextroamphetamine (ADDERALL) 30 MG tablet Take by mouth.   Yes [provider]  baclofen (LIORESAL) 10 MG tablet TAKE 1 TABLET(10 MG) BY MOUTH THREE TIMES DAILY AS NEEDED FOR MUSCLE SPASMS 04/29/20  Yes Just, Azalee Course, FNP  CAPLYTA 42 MG capsule Take 42 mg by mouth at bedtime. 12/16/20  Yes [provider]  doxycycline (VIBRAMYCIN) 100 MG capsule Take 1 capsule (100 mg total) by mouth 2 (two) times daily. 02/10/21  Yes Cheron Schaumann K, PA-C  levothyroxine (SYNTHROID) 200 MCG tablet TAKE 1 TABLET(200 MCG) BY MOUTH DAILY BEFORE BREAKFAST 08/17/19  Yes Lezlie Lye, Irma M, MD  montelukast (SINGULAIR) 10 MG tablet Take 1 tablet (10 mg total) by mouth at bedtime. 01/21/20  Yes Just, Azalee Course, FNP  Multiple Vitamin (MULTI-VITAMIN) tablet Take by mouth.   Yes [provider]  potassium chloride SA (KLOR-CON) 20 MEQ tablet Take 1 tablet (20 mEq total) by mouth once for 1 dose. 03/05/20 02/10/21 Yes Just, Azalee Course, FNP  predniSONE (DELTASONE) 50 MG tablet One tablet a day 02/10/21  Yes Elson Areas, PA-C  triamterene-hydrochlorothiazide (MAXZIDE-25) 37.5-25 MG tablet Take 1 tablet by mouth daily. 12/16/19   Yes Sagardia, Eilleen Kempf, MD  chlorthalidone (HYGROTON) 25 MG tablet TAKE 1 TABLET(25 MG) BY MOUTH DAILY Patient not taking: Reported on 02/10/2021 09/07/19   Lezlie Lye, Meda Coffee, MD  DULoxetine (CYMBALTA) 60 MG capsule Take 1 capsule (60 mg total) by mouth daily. Patient not taking: Reported on 02/10/2021 08/21/20   Anabel Halon, MD  fluticasone [facility]) 50 MCG/ACT nasal spray Place 2 sprays into both nostrils daily. 01/21/20   Just, Azalee Course, FNP  ondansetron (ZOFRAN-ODT) 8 MG disintegrating tablet Take 1 tablet (8 mg total) by mouth 3 (three) times daily as needed. Patient not taking: Reported on 02/10/2021 08/21/20   Anabel Halon, MD  pantoprazole (PROTONIX) 40 MG tablet Take 1 tablet (40 mg total) by mouth daily. 08/21/20 08/16/21  Anabel Halon, MD      Allergies    Morphine and related, Penicillins, Diphenhydramine, Latex, Synthroid [levothyroxine], and Allopurinol    Review of Systems   Review of Systems  HENT:  Positive for congestion.   Respiratory:  Positive for cough and wheezing.   Neurological:  Negative for headaches.  All other systems reviewed and are negative.  Physical Exam Updated Vital Signs BP 112/90    Pulse 77    Temp 97.8 F (36.6 C)    Resp 18    Ht 5\' 7"  (1.702 m)    Wt 77.1 kg    SpO2 99%    BMI 26.63 kg/m  Physical Exam Vitals reviewed.  Constitutional:  Appearance: Normal appearance.  HENT:     Head: Normocephalic.     Nose: Nose normal.     Mouth/Throat:     Mouth: Mucous membranes are moist.  Eyes:     Extraocular Movements: Extraocular movements intact.     Pupils: Pupils are equal, round, and reactive to light.  Cardiovascular:     Rate and Rhythm: Normal rate and regular rhythm.  Pulmonary:     Effort: Pulmonary effort is normal.     Breath sounds: Rhonchi present.  Abdominal:     General: Abdomen is flat.  Skin:    General: Skin is warm.  Neurological:     General: No focal deficit present.     Mental Status: She is alert.   Psychiatric:        Mood and Affect: Mood normal.    ED Results / Procedures / Treatments   Labs (all labs ordered are listed, but only abnormal results are displayed) Labs Reviewed  RESP PANEL BY RT-PCR (FLU A&B, COVID) ARPGX2  COMPREHENSIVE METABOLIC PANEL  CBC WITH DIFFERENTIAL/PLATELET  D-DIMER, QUANTITATIVE  BRAIN NATRIURETIC PEPTIDE  URINALYSIS, ROUTINE W REFLEX MICROSCOPIC  PREGNANCY, URINE    EKG None  Radiology DG Chest 2 View  Result Date: 02/10/2021 CLINICAL DATA:  Shortness of breath, URI, cough, and congestion since just after Christmas EXAM: CHEST - 2 VIEW COMPARISON:  05/21/2006 FINDINGS: Upper normal heart size. Mediastinal contours and pulmonary vascularity normal. Slightly increased markings in RIGHT upper lobe question atelectasis versus infiltrate. Remaining lungs clear. No pleural effusion or pneumothorax. Osseous structures unremarkable. IMPRESSION: INFILTRATE.: IMPRESSION: INFILTRATE. Slightly increased markings in the RIGHT upper lobe question atelectasis versus Electronically Signed   By: Ulyses Southward M.D.   On: 02/10/2021 12:13    Procedures Procedures    Medications Ordered in ED Medications - No data to display  ED Course/ Medical Decision Making/ A&P                           Medical Decision Making Pt has had a cough since before Christmas.  Pt is concerned taht she could have pneumonia.  Pt has some wheezing.  Pt has had gastric bypass and has beenlosing weight   Amount and/or Complexity of Data Reviewed Labs: ordered.    Details: Pt declined labs Radiology: ordered.    Details: Chest xray shows atelectasis/ possible pneumonia  Risk Prescription drug management.           Final Clinical Impression(s) / ED Diagnoses Final diagnoses:  Community acquired pneumonia of right upper lobe of lung    Rx / DC Orders ED Discharge Orders          Ordered    predniSONE (DELTASONE) 50 MG tablet        02/10/21 1251    doxycycline  (VIBRAMYCIN) 100 MG capsule  2 times daily        02/10/21 1251           An After Visit Summary was printed and given to the patient.    Elson Areas, PA-C 02/10/21 1259    Tanda Rockers A, DO 02/11/21 0725

## 2021-02-10 NOTE — Discharge Instructions (Addendum)
Return if any problems.

## 2021-02-10 NOTE — ED Triage Notes (Signed)
Body aches, HAs, sinus issues & "cold" sx since christmas. Dry cough & sore throat. Think she may have pneumonia. SOB and feels like she cant breath when laying flat.

## 2021-02-18 ENCOUNTER — Other Ambulatory Visit (HOSPITAL_BASED_OUTPATIENT_CLINIC_OR_DEPARTMENT_OTHER): Payer: Self-pay

## 2021-02-18 MED ORDER — AMPHETAMINE-DEXTROAMPHET ER 30 MG PO CP24
ORAL_CAPSULE | ORAL | 0 refills | Status: DC
  Filled 2021-02-18: qty 50, 25d supply, fill #0

## 2021-02-19 ENCOUNTER — Other Ambulatory Visit (HOSPITAL_BASED_OUTPATIENT_CLINIC_OR_DEPARTMENT_OTHER): Payer: Self-pay

## 2021-02-22 ENCOUNTER — Other Ambulatory Visit: Payer: Self-pay | Admitting: Emergency Medicine

## 2021-03-15 ENCOUNTER — Other Ambulatory Visit (HOSPITAL_BASED_OUTPATIENT_CLINIC_OR_DEPARTMENT_OTHER): Payer: Self-pay

## 2021-03-15 MED ORDER — AMPHETAMINE-DEXTROAMPHET ER 30 MG PO CP24
ORAL_CAPSULE | ORAL | 0 refills | Status: DC
  Filled 2021-03-15: qty 60, 30d supply, fill #0

## 2021-03-16 ENCOUNTER — Other Ambulatory Visit (HOSPITAL_BASED_OUTPATIENT_CLINIC_OR_DEPARTMENT_OTHER): Payer: Self-pay

## 2021-03-18 DIAGNOSIS — G894 Chronic pain syndrome: Secondary | ICD-10-CM | POA: Diagnosis not present

## 2021-03-30 DIAGNOSIS — F3174 Bipolar disorder, in full remission, most recent episode manic: Secondary | ICD-10-CM | POA: Diagnosis not present

## 2021-03-30 DIAGNOSIS — F3176 Bipolar disorder, in full remission, most recent episode depressed: Secondary | ICD-10-CM | POA: Diagnosis not present

## 2021-03-30 DIAGNOSIS — F900 Attention-deficit hyperactivity disorder, predominantly inattentive type: Secondary | ICD-10-CM | POA: Diagnosis not present

## 2021-03-30 DIAGNOSIS — F4322 Adjustment disorder with anxiety: Secondary | ICD-10-CM | POA: Diagnosis not present

## 2021-04-01 ENCOUNTER — Other Ambulatory Visit: Payer: Self-pay

## 2021-04-12 ENCOUNTER — Other Ambulatory Visit (HOSPITAL_BASED_OUTPATIENT_CLINIC_OR_DEPARTMENT_OTHER): Payer: Self-pay

## 2021-04-12 MED ORDER — AMPHETAMINE-DEXTROAMPHET ER 30 MG PO CP24
ORAL_CAPSULE | ORAL | 0 refills | Status: DC
  Filled 2021-04-14: qty 30, 30d supply, fill #0

## 2021-04-12 MED ORDER — AMPHETAMINE-DEXTROAMPHETAMINE 30 MG PO TABS
ORAL_TABLET | Freq: Every day | ORAL | 0 refills | Status: DC
  Filled 2021-04-12: qty 30, 30d supply, fill #0

## 2021-04-13 ENCOUNTER — Other Ambulatory Visit (HOSPITAL_BASED_OUTPATIENT_CLINIC_OR_DEPARTMENT_OTHER): Payer: Self-pay

## 2021-04-14 ENCOUNTER — Other Ambulatory Visit (HOSPITAL_BASED_OUTPATIENT_CLINIC_OR_DEPARTMENT_OTHER): Payer: Self-pay

## 2021-04-15 DIAGNOSIS — G894 Chronic pain syndrome: Secondary | ICD-10-CM | POA: Diagnosis not present

## 2021-05-12 ENCOUNTER — Other Ambulatory Visit (HOSPITAL_BASED_OUTPATIENT_CLINIC_OR_DEPARTMENT_OTHER): Payer: Self-pay

## 2021-07-01 DIAGNOSIS — H04123 Dry eye syndrome of bilateral lacrimal glands: Secondary | ICD-10-CM | POA: Diagnosis not present

## 2021-07-19 DIAGNOSIS — H04123 Dry eye syndrome of bilateral lacrimal glands: Secondary | ICD-10-CM | POA: Diagnosis not present

## 2021-08-16 ENCOUNTER — Observation Stay (HOSPITAL_COMMUNITY): Payer: BC Managed Care – PPO

## 2021-08-16 ENCOUNTER — Emergency Department (HOSPITAL_COMMUNITY): Payer: BC Managed Care – PPO

## 2021-08-16 ENCOUNTER — Encounter (HOSPITAL_COMMUNITY): Payer: Self-pay

## 2021-08-16 ENCOUNTER — Inpatient Hospital Stay (HOSPITAL_COMMUNITY)
Admission: EM | Admit: 2021-08-16 | Discharge: 2021-08-19 | DRG: 378 | Disposition: A | Discharge status: Home / Self Care | Payer: BC Managed Care – PPO | Attending: Internal Medicine | Admitting: Internal Medicine

## 2021-08-16 ENCOUNTER — Other Ambulatory Visit: Payer: Self-pay

## 2021-08-16 DIAGNOSIS — D6959 Other secondary thrombocytopenia: Secondary | ICD-10-CM | POA: Diagnosis not present

## 2021-08-16 DIAGNOSIS — K8070 Calculus of gallbladder and bile duct without cholecystitis without obstruction: Secondary | ICD-10-CM | POA: Diagnosis not present

## 2021-08-16 DIAGNOSIS — Z79890 Hormone replacement therapy (postmenopausal): Secondary | ICD-10-CM

## 2021-08-16 DIAGNOSIS — Z91040 Latex allergy status: Secondary | ICD-10-CM

## 2021-08-16 DIAGNOSIS — R935 Abnormal findings on diagnostic imaging of other abdominal regions, including retroperitoneum: Secondary | ICD-10-CM | POA: Diagnosis not present

## 2021-08-16 DIAGNOSIS — I10 Essential (primary) hypertension: Secondary | ICD-10-CM | POA: Diagnosis present

## 2021-08-16 DIAGNOSIS — W19XXXA Unspecified fall, initial encounter: Secondary | ICD-10-CM | POA: Diagnosis present

## 2021-08-16 DIAGNOSIS — I959 Hypotension, unspecified: Secondary | ICD-10-CM | POA: Diagnosis not present

## 2021-08-16 DIAGNOSIS — D539 Nutritional anemia, unspecified: Secondary | ICD-10-CM | POA: Diagnosis present

## 2021-08-16 DIAGNOSIS — R7881 Bacteremia: Secondary | ICD-10-CM

## 2021-08-16 DIAGNOSIS — K259 Gastric ulcer, unspecified as acute or chronic, without hemorrhage or perforation: Secondary | ICD-10-CM | POA: Diagnosis not present

## 2021-08-16 DIAGNOSIS — S92351A Displaced fracture of fifth metatarsal bone, right foot, initial encounter for closed fracture: Secondary | ICD-10-CM | POA: Diagnosis not present

## 2021-08-16 DIAGNOSIS — K921 Melena: Secondary | ICD-10-CM | POA: Diagnosis not present

## 2021-08-16 DIAGNOSIS — F1021 Alcohol dependence, in remission: Secondary | ICD-10-CM | POA: Diagnosis not present

## 2021-08-16 DIAGNOSIS — B9620 Unspecified Escherichia coli [E. coli] as the cause of diseases classified elsewhere: Secondary | ICD-10-CM

## 2021-08-16 DIAGNOSIS — Z79899 Other long term (current) drug therapy: Secondary | ICD-10-CM

## 2021-08-16 DIAGNOSIS — S92353A Displaced fracture of fifth metatarsal bone, unspecified foot, initial encounter for closed fracture: Secondary | ICD-10-CM | POA: Diagnosis present

## 2021-08-16 DIAGNOSIS — F988 Other specified behavioral and emotional disorders with onset usually occurring in childhood and adolescence: Secondary | ICD-10-CM | POA: Diagnosis not present

## 2021-08-16 DIAGNOSIS — M533 Sacrococcygeal disorders, not elsewhere classified: Secondary | ICD-10-CM | POA: Diagnosis not present

## 2021-08-16 DIAGNOSIS — R509 Fever, unspecified: Secondary | ICD-10-CM | POA: Diagnosis not present

## 2021-08-16 DIAGNOSIS — G894 Chronic pain syndrome: Secondary | ICD-10-CM | POA: Diagnosis present

## 2021-08-16 DIAGNOSIS — S92352A Displaced fracture of fifth metatarsal bone, left foot, initial encounter for closed fracture: Secondary | ICD-10-CM | POA: Diagnosis not present

## 2021-08-16 DIAGNOSIS — T7840XD Allergy, unspecified, subsequent encounter: Secondary | ICD-10-CM

## 2021-08-16 DIAGNOSIS — D62 Acute posthemorrhagic anemia: Secondary | ICD-10-CM | POA: Diagnosis not present

## 2021-08-16 DIAGNOSIS — K2081 Other esophagitis with bleeding: Secondary | ICD-10-CM | POA: Diagnosis not present

## 2021-08-16 DIAGNOSIS — K264 Chronic or unspecified duodenal ulcer with hemorrhage: Secondary | ICD-10-CM | POA: Diagnosis not present

## 2021-08-16 DIAGNOSIS — R7989 Other specified abnormal findings of blood chemistry: Secondary | ICD-10-CM | POA: Diagnosis not present

## 2021-08-16 DIAGNOSIS — R17 Unspecified jaundice: Secondary | ICD-10-CM | POA: Diagnosis not present

## 2021-08-16 DIAGNOSIS — K254 Chronic or unspecified gastric ulcer with hemorrhage: Secondary | ICD-10-CM | POA: Diagnosis not present

## 2021-08-16 DIAGNOSIS — D649 Anemia, unspecified: Principal | ICD-10-CM | POA: Diagnosis present

## 2021-08-16 DIAGNOSIS — D72829 Elevated white blood cell count, unspecified: Secondary | ICD-10-CM | POA: Diagnosis not present

## 2021-08-16 DIAGNOSIS — Z20822 Contact with and (suspected) exposure to covid-19: Secondary | ICD-10-CM | POA: Diagnosis not present

## 2021-08-16 DIAGNOSIS — Z9884 Bariatric surgery status: Secondary | ICD-10-CM

## 2021-08-16 DIAGNOSIS — Z880 Allergy status to penicillin: Secondary | ICD-10-CM

## 2021-08-16 DIAGNOSIS — K7011 Alcoholic hepatitis with ascites: Secondary | ICD-10-CM | POA: Diagnosis not present

## 2021-08-16 DIAGNOSIS — E039 Hypothyroidism, unspecified: Secondary | ICD-10-CM | POA: Diagnosis present

## 2021-08-16 DIAGNOSIS — R188 Other ascites: Secondary | ICD-10-CM | POA: Diagnosis not present

## 2021-08-16 DIAGNOSIS — K922 Gastrointestinal hemorrhage, unspecified: Secondary | ICD-10-CM | POA: Diagnosis not present

## 2021-08-16 DIAGNOSIS — M79661 Pain in right lower leg: Secondary | ICD-10-CM | POA: Diagnosis not present

## 2021-08-16 DIAGNOSIS — Z885 Allergy status to narcotic agent status: Secondary | ICD-10-CM | POA: Diagnosis not present

## 2021-08-16 DIAGNOSIS — S0990XA Unspecified injury of head, initial encounter: Secondary | ICD-10-CM | POA: Diagnosis not present

## 2021-08-16 DIAGNOSIS — I517 Cardiomegaly: Secondary | ICD-10-CM | POA: Diagnosis not present

## 2021-08-16 DIAGNOSIS — K2971 Gastritis, unspecified, with bleeding: Secondary | ICD-10-CM | POA: Diagnosis present

## 2021-08-16 DIAGNOSIS — T07XXXA Unspecified multiple injuries, initial encounter: Secondary | ICD-10-CM | POA: Diagnosis not present

## 2021-08-16 DIAGNOSIS — R296 Repeated falls: Secondary | ICD-10-CM | POA: Diagnosis present

## 2021-08-16 DIAGNOSIS — R7401 Elevation of levels of liver transaminase levels: Secondary | ICD-10-CM | POA: Diagnosis present

## 2021-08-16 DIAGNOSIS — Z91048 Other nonmedicinal substance allergy status: Secondary | ICD-10-CM

## 2021-08-16 DIAGNOSIS — Z8249 Family history of ischemic heart disease and other diseases of the circulatory system: Secondary | ICD-10-CM

## 2021-08-16 DIAGNOSIS — F17210 Nicotine dependence, cigarettes, uncomplicated: Secondary | ICD-10-CM | POA: Diagnosis present

## 2021-08-16 DIAGNOSIS — Z818 Family history of other mental and behavioral disorders: Secondary | ICD-10-CM

## 2021-08-16 DIAGNOSIS — E876 Hypokalemia: Secondary | ICD-10-CM | POA: Diagnosis present

## 2021-08-16 DIAGNOSIS — M79662 Pain in left lower leg: Secondary | ICD-10-CM | POA: Diagnosis not present

## 2021-08-16 DIAGNOSIS — M79672 Pain in left foot: Secondary | ICD-10-CM | POA: Diagnosis not present

## 2021-08-16 DIAGNOSIS — K3189 Other diseases of stomach and duodenum: Secondary | ICD-10-CM | POA: Diagnosis not present

## 2021-08-16 DIAGNOSIS — K269 Duodenal ulcer, unspecified as acute or chronic, without hemorrhage or perforation: Secondary | ICD-10-CM | POA: Diagnosis not present

## 2021-08-16 DIAGNOSIS — K829 Disease of gallbladder, unspecified: Secondary | ICD-10-CM | POA: Diagnosis not present

## 2021-08-16 DIAGNOSIS — G4733 Obstructive sleep apnea (adult) (pediatric): Secondary | ICD-10-CM | POA: Diagnosis present

## 2021-08-16 DIAGNOSIS — R531 Weakness: Secondary | ICD-10-CM | POA: Diagnosis not present

## 2021-08-16 DIAGNOSIS — E509 Vitamin A deficiency, unspecified: Secondary | ICD-10-CM | POA: Diagnosis present

## 2021-08-16 DIAGNOSIS — F909 Attention-deficit hyperactivity disorder, unspecified type: Secondary | ICD-10-CM | POA: Diagnosis present

## 2021-08-16 DIAGNOSIS — K219 Gastro-esophageal reflux disease without esophagitis: Secondary | ICD-10-CM | POA: Diagnosis present

## 2021-08-16 DIAGNOSIS — Z811 Family history of alcohol abuse and dependence: Secondary | ICD-10-CM

## 2021-08-16 DIAGNOSIS — K2970 Gastritis, unspecified, without bleeding: Secondary | ICD-10-CM | POA: Diagnosis not present

## 2021-08-16 DIAGNOSIS — K279 Peptic ulcer, site unspecified, unspecified as acute or chronic, without hemorrhage or perforation: Secondary | ICD-10-CM | POA: Diagnosis not present

## 2021-08-16 DIAGNOSIS — Z888 Allergy status to other drugs, medicaments and biological substances status: Secondary | ICD-10-CM

## 2021-08-16 DIAGNOSIS — T39395A Adverse effect of other nonsteroidal anti-inflammatory drugs [NSAID], initial encounter: Secondary | ICD-10-CM | POA: Diagnosis present

## 2021-08-16 DIAGNOSIS — K824 Cholesterolosis of gallbladder: Secondary | ICD-10-CM | POA: Diagnosis not present

## 2021-08-16 DIAGNOSIS — E162 Hypoglycemia, unspecified: Secondary | ICD-10-CM | POA: Diagnosis present

## 2021-08-16 DIAGNOSIS — M797 Fibromyalgia: Secondary | ICD-10-CM | POA: Diagnosis present

## 2021-08-16 LAB — HEMOGLOBIN AND HEMATOCRIT, BLOOD
HCT: 24.9 % — ABNORMAL LOW (ref 36.0–46.0)
Hemoglobin: 8.7 g/dL — ABNORMAL LOW (ref 12.0–15.0)

## 2021-08-16 LAB — CBC WITH DIFFERENTIAL/PLATELET
Abs Immature Granulocytes: 0.03 10*3/uL (ref 0.00–0.07)
Basophils Absolute: 0 10*3/uL (ref 0.0–0.1)
Basophils Relative: 0 %
Eosinophils Absolute: 0 10*3/uL (ref 0.0–0.5)
Eosinophils Relative: 0 %
HCT: 19.1 % — ABNORMAL LOW (ref 36.0–46.0)
Hemoglobin: 6.4 g/dL — CL (ref 12.0–15.0)
Immature Granulocytes: 0 %
Lymphocytes Relative: 14 %
Lymphs Abs: 1 10*3/uL (ref 0.7–4.0)
MCH: 34.6 pg — ABNORMAL HIGH (ref 26.0–34.0)
MCHC: 33.5 g/dL (ref 30.0–36.0)
MCV: 103.2 fL — ABNORMAL HIGH (ref 80.0–100.0)
Monocytes Absolute: 0.1 10*3/uL (ref 0.1–1.0)
Monocytes Relative: 1 %
Neutro Abs: 6.2 10*3/uL (ref 1.7–7.7)
Neutrophils Relative %: 85 %
Platelets: 140 10*3/uL — ABNORMAL LOW (ref 150–400)
RBC: 1.85 MIL/uL — ABNORMAL LOW (ref 3.87–5.11)
RDW: 15 % (ref 11.5–15.5)
WBC: 7.4 10*3/uL (ref 4.0–10.5)
nRBC: 1.6 % — ABNORMAL HIGH (ref 0.0–0.2)

## 2021-08-16 LAB — RAPID URINE DRUG SCREEN, HOSP PERFORMED
Amphetamines: POSITIVE — AB
Barbiturates: NOT DETECTED
Benzodiazepines: NOT DETECTED
Cocaine: NOT DETECTED
Opiates: NOT DETECTED
Tetrahydrocannabinol: NOT DETECTED

## 2021-08-16 LAB — COMPREHENSIVE METABOLIC PANEL
ALT: 276 U/L — ABNORMAL HIGH (ref 0–44)
AST: 586 U/L — ABNORMAL HIGH (ref 15–41)
Albumin: 2.4 g/dL — ABNORMAL LOW (ref 3.5–5.0)
Alkaline Phosphatase: 185 U/L — ABNORMAL HIGH (ref 38–126)
Anion gap: 4 — ABNORMAL LOW (ref 5–15)
BUN: 10 mg/dL (ref 6–20)
CO2: 21 mmol/L — ABNORMAL LOW (ref 22–32)
Calcium: 7.3 mg/dL — ABNORMAL LOW (ref 8.9–10.3)
Chloride: 114 mmol/L — ABNORMAL HIGH (ref 98–111)
Creatinine, Ser: 0.63 mg/dL (ref 0.44–1.00)
GFR, Estimated: >60 mL/min (ref >60)
Glucose, Bld: 67 mg/dL — ABNORMAL LOW (ref 70–99)
Potassium: 2.8 mmol/L — ABNORMAL LOW (ref 3.5–5.1)
Sodium: 139 mmol/L (ref 135–145)
Total Bilirubin: 1.9 mg/dL — ABNORMAL HIGH (ref 0.3–1.2)
Total Protein: 4.5 g/dL — ABNORMAL LOW (ref 6.5–8.1)

## 2021-08-16 LAB — URINALYSIS, ROUTINE W REFLEX MICROSCOPIC
Bilirubin Urine: NEGATIVE
Glucose, UA: NEGATIVE mg/dL
Hgb urine dipstick: NEGATIVE
Ketones, ur: NEGATIVE mg/dL
Leukocytes,Ua: NEGATIVE
Nitrite: NEGATIVE
Protein, ur: NEGATIVE mg/dL
Specific Gravity, Urine: 1.013 (ref 1.005–1.030)
pH: 5 (ref 5.0–8.0)

## 2021-08-16 LAB — MAGNESIUM: Magnesium: 1.6 mg/dL — ABNORMAL LOW (ref 1.7–2.4)

## 2021-08-16 LAB — BASIC METABOLIC PANEL
Anion gap: 7 (ref 5–15)
BUN: 12 mg/dL (ref 6–20)
CO2: 22 mmol/L (ref 22–32)
Calcium: 8.3 mg/dL — ABNORMAL LOW (ref 8.9–10.3)
Chloride: 110 mmol/L (ref 98–111)
Creatinine, Ser: 0.69 mg/dL (ref 0.44–1.00)
GFR, Estimated: >60 mL/min (ref >60)
Glucose, Bld: 68 mg/dL — ABNORMAL LOW (ref 70–99)
Potassium: 2.7 mmol/L — CL (ref 3.5–5.1)
Sodium: 139 mmol/L (ref 135–145)

## 2021-08-16 LAB — ACETAMINOPHEN LEVEL: Acetaminophen (Tylenol), Serum: <10 ug/mL — ABNORMAL LOW (ref 10–30)

## 2021-08-16 LAB — ETHANOL: Alcohol, Ethyl (B): <10 mg/dL (ref <10)

## 2021-08-16 LAB — LACTIC ACID, PLASMA
Lactic Acid, Venous: 1.4 mmol/L (ref 0.5–1.9)
Lactic Acid, Venous: 1.3 mmol/L (ref 0.5–1.9)

## 2021-08-16 LAB — HEPATITIS PANEL, ACUTE
HCV Ab: NONREACTIVE
Hep A IgM: NONREACTIVE
Hep B C IgM: NONREACTIVE
Hepatitis B Surface Ag: NONREACTIVE

## 2021-08-16 LAB — FOLATE: Folate: 9.5 ng/mL (ref >5.9)

## 2021-08-16 LAB — CK: Total CK: 384 U/L — ABNORMAL HIGH (ref 38–234)

## 2021-08-16 LAB — HIV ANTIBODY (ROUTINE TESTING W REFLEX): HIV Screen 4th Generation wRfx: NONREACTIVE

## 2021-08-16 LAB — CBG MONITORING, ED: Glucose-Capillary: 81 mg/dL (ref 70–99)

## 2021-08-16 LAB — IRON AND TIBC
Iron: 13 ug/dL — ABNORMAL LOW (ref 28–170)
Saturation Ratios: 8 % — ABNORMAL LOW (ref 10.4–31.8)
TIBC: 174 ug/dL — ABNORMAL LOW (ref 250–450)
UIBC: 161 ug/dL

## 2021-08-16 LAB — PROTIME-INR
INR: 1.3 — ABNORMAL HIGH (ref 0.8–1.2)
Prothrombin Time: 15.8 seconds — ABNORMAL HIGH (ref 11.4–15.2)

## 2021-08-16 LAB — HEPATIC FUNCTION PANEL
ALT: 372 U/L — ABNORMAL HIGH (ref 0–44)
AST: 1269 U/L — ABNORMAL HIGH (ref 15–41)
Albumin: 2.7 g/dL — ABNORMAL LOW (ref 3.5–5.0)
Alkaline Phosphatase: 220 U/L — ABNORMAL HIGH (ref 38–126)
Bilirubin, Direct: 0.6 mg/dL — ABNORMAL HIGH (ref 0.0–0.2)
Indirect Bilirubin: 0.6 mg/dL (ref 0.3–0.9)
Total Bilirubin: 1.2 mg/dL (ref 0.3–1.2)
Total Protein: 4.8 g/dL — ABNORMAL LOW (ref 6.5–8.1)

## 2021-08-16 LAB — PREGNANCY, URINE: Preg Test, Ur: NEGATIVE

## 2021-08-16 LAB — HCG, SERUM, QUALITATIVE: Preg, Serum: NEGATIVE

## 2021-08-16 LAB — VITAMIN B12: Vitamin B-12: 1178 pg/mL — ABNORMAL HIGH (ref 180–914)

## 2021-08-16 LAB — APTT: aPTT: 35 seconds (ref 24–36)

## 2021-08-16 LAB — SARS CORONAVIRUS 2 BY RT PCR: SARS Coronavirus 2 by RT PCR: NEGATIVE

## 2021-08-16 LAB — FERRITIN: Ferritin: 426 ng/mL — ABNORMAL HIGH (ref 11–307)

## 2021-08-16 LAB — TSH: TSH: 66.996 u[IU]/mL — ABNORMAL HIGH (ref 0.350–4.500)

## 2021-08-16 LAB — PREPARE RBC (CROSSMATCH)

## 2021-08-16 MED ORDER — SODIUM CHLORIDE 0.9 % IV SOLN
10.0000 mL/h | Freq: Once | INTRAVENOUS | Status: AC
  Administered 2021-08-16: 10 mL/h via INTRAVENOUS

## 2021-08-16 MED ORDER — ONDANSETRON HCL 4 MG/2ML IJ SOLN: 4.0000 mg | Freq: Four times a day (QID) | INTRAMUSCULAR | Status: DC | PRN

## 2021-08-16 MED ORDER — POTASSIUM CHLORIDE CRYS ER 20 MEQ PO TBCR
40.0000 meq | EXTENDED_RELEASE_TABLET | Freq: Once | ORAL | Status: AC
  Administered 2021-08-16: 40 meq via ORAL
  Filled 2021-08-16: qty 2

## 2021-08-16 MED ORDER — THIAMINE HCL 100 MG PO TABS
100.0000 mg | ORAL_TABLET | Freq: Every day | ORAL | Status: DC
  Administered 2021-08-16 – 2021-08-19 (×4): 100 mg via ORAL
  Filled 2021-08-16 (×4): qty 1

## 2021-08-16 MED ORDER — LEVOTHYROXINE SODIUM 100 MCG PO TABS
200.0000 ug | ORAL_TABLET | Freq: Every day | ORAL | Status: DC
  Administered 2021-08-16 – 2021-08-19 (×3): 200 ug via ORAL
  Filled 2021-08-16 (×3): qty 2

## 2021-08-16 MED ORDER — SODIUM CHLORIDE 0.9 % IV SOLN
INTRAVENOUS | Status: DC
Start: 2021-08-16 — End: 2021-08-17

## 2021-08-16 MED ORDER — POTASSIUM CHLORIDE 10 MEQ/100ML IV SOLN
INTRAVENOUS | Status: AC
  Filled 2021-08-16: qty 100

## 2021-08-16 MED ORDER — PANTOPRAZOLE 80MG IVPB - SIMPLE MED
80.0000 mg | Freq: Once | INTRAVENOUS | Status: AC
  Administered 2021-08-16: 80 mg via INTRAVENOUS
  Filled 2021-08-16: qty 80

## 2021-08-16 MED ORDER — MAGNESIUM SULFATE 2 GM/50ML IV SOLN
2.0000 g | Freq: Once | INTRAVENOUS | Status: AC
Start: 2021-08-16 — End: 2021-08-16
  Administered 2021-08-16: 2 g via INTRAVENOUS
  Filled 2021-08-16: qty 50

## 2021-08-16 MED ORDER — SODIUM CHLORIDE 0.9 % IV BOLUS
1000.0000 mL | Freq: Once | INTRAVENOUS | Status: AC
  Administered 2021-08-16: 1000 mL via INTRAVENOUS

## 2021-08-16 MED ORDER — LORAZEPAM 1 MG PO TABS
1.0000 mg | ORAL_TABLET | ORAL | Status: AC | PRN
  Administered 2021-08-18: 1 mg via ORAL
  Filled 2021-08-16: qty 1

## 2021-08-16 MED ORDER — THIAMINE HCL 100 MG/ML IJ SOLN
100.0000 mg | Freq: Every day | INTRAMUSCULAR | Status: DC
Start: 2021-08-16 — End: 2021-08-19
  Filled 2021-08-16: qty 2

## 2021-08-16 MED ORDER — ONDANSETRON HCL 4 MG PO TABS: 4.0000 mg | ORAL_TABLET | Freq: Four times a day (QID) | ORAL | Status: DC | PRN

## 2021-08-16 MED ORDER — FOLIC ACID 1 MG PO TABS
1.0000 mg | ORAL_TABLET | Freq: Every day | ORAL | Status: DC
  Administered 2021-08-16 – 2021-08-19 (×4): 1 mg via ORAL
  Filled 2021-08-16 (×4): qty 1

## 2021-08-16 MED ORDER — PANTOPRAZOLE INFUSION (NEW) - SIMPLE MED
8.0000 mg/h | INTRAVENOUS | Status: AC
  Administered 2021-08-16 – 2021-08-19 (×7): 8 mg/h via INTRAVENOUS
  Filled 2021-08-16: qty 100
  Filled 2021-08-16: qty 80
  Filled 2021-08-16: qty 100
  Filled 2021-08-16: qty 80
  Filled 2021-08-16: qty 100
  Filled 2021-08-16: qty 80
  Filled 2021-08-16 (×2): qty 100
  Filled 2021-08-16 (×2): qty 80

## 2021-08-16 MED ORDER — POTASSIUM CHLORIDE 10 MEQ/100ML IV SOLN
10.0000 meq | INTRAVENOUS | Status: AC
  Administered 2021-08-16 (×4): 10 meq via INTRAVENOUS
  Filled 2021-08-16 (×3): qty 100

## 2021-08-16 MED ORDER — LACTATED RINGERS IV BOLUS
20.0000 mL/kg | Freq: Once | INTRAVENOUS | Status: AC
  Administered 2021-08-16: 1542 mL via INTRAVENOUS

## 2021-08-16 MED ORDER — ADULT MULTIVITAMIN W/MINERALS CH
1.0000 | ORAL_TABLET | Freq: Every day | ORAL | Status: DC
  Administered 2021-08-16 – 2021-08-19 (×4): 1 via ORAL
  Filled 2021-08-16 (×4): qty 1

## 2021-08-16 MED ORDER — CYANOCOBALAMIN 1000 MCG/ML IJ SOLN
1000.0000 ug | Freq: Once | INTRAMUSCULAR | Status: AC
Start: 2021-08-16 — End: 2021-08-16
  Administered 2021-08-16: 1000 ug via INTRAMUSCULAR
  Filled 2021-08-16: qty 1

## 2021-08-16 MED ORDER — LORAZEPAM 2 MG/ML IJ SOLN
1.0000 mg | INTRAMUSCULAR | Status: AC | PRN
  Administered 2021-08-18: 1 mg via INTRAVENOUS
  Filled 2021-08-16: qty 1

## 2021-08-16 MED ORDER — PANTOPRAZOLE SODIUM 40 MG IV SOLR: 40.0000 mg | Freq: Two times a day (BID) | INTRAVENOUS | Status: DC

## 2021-08-16 NOTE — Assessment & Plan Note (Signed)
Cont home synthroid Checking TSH

## 2021-08-16 NOTE — Assessment & Plan Note (Addendum)
1. Replace K ( IV and 40 PO) 2. Checking stat Mg lab 3. Tele monitor 4. Get stat EKG 5. Next CMP ordered for noon.

## 2021-08-16 NOTE — Assessment & Plan Note (Addendum)
Liver numbers certianly in a pattern associated with EtOH hepatitis: AST:ALT of 4:1!  And patient admits to relapse of EtOH abuse with last drink x5 days ago. But AST elevation seems extreme even for EtOH hepatitis. 1. Sending message to Dr. Dulce Sellar to see if he feels GI should consult / curbside, or if this is 'just' EtOH hepatitis with an unusually elevated AST. 2. Get stat tylenol level - patient denies though 3. Check stat CPK 4. Hepatitis pnl pending 5. RUQ [place] 6. NPO for now

## 2021-08-16 NOTE — Assessment & Plan Note (Deleted)
Med rec pending Hold anything hepatotoxic.

## 2021-08-16 NOTE — Assessment & Plan Note (Signed)
Hold baclofen for the moment.

## 2021-08-16 NOTE — Assessment & Plan Note (Signed)
EDP placing cam boot.

## 2021-08-16 NOTE — Progress Notes (Signed)
Orthopedic Tech Progress Note Patient Details:  Renee [name] 07-24-1975 [id number]  Ortho Devices Type of Ortho Device: CAM walker Ortho Device/Splint Location: right Ortho Device/Splint Interventions: Application   Post Interventions Patient Tolerated: Well Instructions Provided: Care of device, Adjustment of device  Saul Fordyce 08/16/2021, 3:47 PM
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
## 2021-08-16 NOTE — ED Notes (Signed)
Patient made aware of urine sample. Pahwani, MD made aware that patient has not voided since shift change at 0700.

## 2021-08-16 NOTE — Assessment & Plan Note (Signed)
Hold Adderall

## 2021-08-16 NOTE — Progress Notes (Addendum)
PROGRESS NOTE    Renee [name]  RKY:[id number] DOB: January 06, 1976 DOA: 08/16/2021 PCP: Oneita Hurt, No   Brief Narrative:  HPI: Renee [name] is a 46 y.o. female with medical history significant of EtOH abuse, ADD, HTN, chronic pain syndrome, prior bariatric surgery.   She states she has been falling frequently for the past 3 days.  She has bruising in multiple places primarily her lower legs and feet.  She has also hit her head and her coccyx.  She rates her pain as a 6 out of 10.  She is able to bear weight in her lower extremities but can only ambulate with assistance.  She denies recent alcohol use but has a history of alcohol abuse in the past.  Pt DOES admit to using EtOH again, last drink 5 days ago.   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   EMS reports seeing multiple cans of whipped cream at her home, perhaps suggesting nitrous oxide abuse.   Patient: Admits to 1 day history of melena.  Also admits to splitting a 5th of tequila with her husband x5 days ago, no EtOH for several weeks prior she states.  Denies ongoing abuse.  Denies any NO2 abuse, not sure what EMS talking about with the cans of whip cream.  Assessment & Plan:   Principal Problem:   Symptomatic anemia Active Problems:   Transaminitis   Hypokalemia   Fracture of 5th metatarsal   Fever   Hypothyroidism   ADD (attention deficit disorder)   Alcohol dependence in remission (HCC)   Chronic pain syndrome  Symptomatic anemia/acute blood loss anemia/likely upper GI bleed: Patient with history of melena and hemoglobin 6.4 upon presentation.  2 unit PRBC transfusion ordered.  FOBT pending.  Iron studies and B12 pending.  Has history of B12 deficiency as well but B12 level is above normal here.  She has been started on Protonix drip, I will discontinue that and instead start on Protonix IV twice daily.  Night hospitalist Dr. Julian Reil had sent a secure chat message to Dr. Dulce Sellar, I was added to the chart  early this morning, it appears that [facility] has not read the message yet, I have added Dr. Bosie Clos to the same chat as he is on-call for the [facility] for unassigned patients.  Patient is very adamant on eating but we are keeping her n.p.o. in case GI plans to do EGD today.  Patient understands that.  GI has been requested to see the patient sooner than later and liberate her diet if no plans for EGD.  Alcoholic hepatitis: AST greater than ALT consistent with alcoholic hepatitis but AST much more elevated than ALT.  Unsure whether there is another etiology.  GI has been consulted and we will defer to them.  Follow LFTs on daily basis.  Ultrasound abdomen did not show any liver pathology.  Viral hepatitis panel pending.  Gallstone/possible CBD stone/sludge: Ultrasound abdomen shows gallstones but no cholecystitis.  There is some concern of possible CBD sludge as there is some dilatation of the CBD as well.  Will defer to GI if MRCP or ERCP is indicated.  Hypotension: No previous history of hypertension.  Unknown blood pressure readings at home.  Monitor closely.  Lactic acid is normal.  Patient is asymptomatic.  Frequent falls: PT OT consulted.  EtOH abuse: Last drink was 6 days ago.  She states that she was sober for many years until 2021 when she started drinking again but she  drinks only twice a month and drinks only very little.  Continue CIWA protocol.  No withdrawal symptoms.  Hypomagnesemia: We will replace.  Hypoglycemia: Likely due to poor intake due to alcohol abuse.  Monitor closely.  Hypokalemia: 2.7 this morning, she was replaced some.  We will repeat later today.  Thrombocytopenia: Likely secondary to chronic alcohol abuse.  Monitor closely.  Fever: 100.8 fever in the ED, no clear source of infection.  Chest x-ray negative.  UA pending.  Follow fever curve.  Watch without antibiotics for now.  Fracture of left fifth metatarsal: EDP placed patient in cam boot.  Chronic pain  syndrome: Hold baclofen for the moment due to hypotension.  ADD: Hold Adderall.  Hypothyroidism: Continue home Synthroid.  DVT prophylaxis: SCDs Start: 08/16/21 0540   Code Status: Full Code  Family Communication:  None present at bedside.  Plan of care discussed with patient in length and he/she verbalized understanding and agreed with it.  Status is: Observation The patient will require care spanning > 2 midnights and should be moved to inpatient because: Requires evaluation by EGD and further monitoring and PT OT evaluation.   Estimated body mass index is 26.23 kg/m as calculated from the following:   Height as of this encounter: 5' 7.5" (1.715 m).   Weight as of this encounter: 77.1 kg.    Nutritional Assessment: Body mass index is 26.23 kg/m.Marland Kitchen. Seen by dietician.  I agree with the assessment and plan as outlined below: Nutrition Status:        . Skin Assessment: I have examined the patient's skin and I agree with the wound assessment as performed by the wound care RN as outlined below:    Consultants:  GI  Procedures:  None  Antimicrobials:  Anti-infectives (From admission, onward)    None         Subjective: Patient seen and examined.  She was alert and oriented.  She had no complaint other than just generalized weakness in the foot pain.  Objective: Vitals:   08/16/21 0513 08/16/21 0530 08/16/21 0730 08/16/21 0800  BP: (!) 89/56 (!) 88/60 92/65 (!) 84/61  Pulse: 82 79 80   Resp: 17 17 20    Temp: 99.1 F (37.3 C)   99.8 F (37.7 C)  TempSrc: Oral   Oral  SpO2: 96% 97% 98%   Weight:      Height:       No intake or output data in the 24 hours ending 08/16/21 0810 Filed Weights   08/16/21 0145  Weight: 77.1 kg    Examination:  General exam: Appears calm and comfortable  Respiratory system: Clear to auscultation. Respiratory effort normal. Cardiovascular system: S1 & S2 heard, RRR. No JVD, murmurs, rubs, gallops or clicks. No pedal  edema. Gastrointestinal system: Abdomen is nondistended, soft and nontender. No organomegaly or masses felt. Normal bowel sounds heard. Central nervous system: Alert and oriented. No focal neurological deficits. Extremities: Symmetric 5 x 5 power.  Multiple bruises in bilateral lower extremities. Psychiatry: Judgement and insight appear normal. Mood & affect appropriate.    Data Reviewed: I have personally reviewed following labs and imaging studies  CBC: Recent Labs  Lab 08/16/21 0226  WBC 7.4  NEUTROABS 6.2  HGB 6.4*  HCT 19.1*  MCV 103.2*  PLT 140*   Basic Metabolic Panel: Recent Labs  Lab 08/16/21 0226 08/16/21 0538  NA 139  --   K 2.7*  --   CL 110  --   CO2 22  --  GLUCOSE 68*  --   BUN 12  --   CREATININE 0.69  --   CALCIUM 8.3*  --   MG  --  1.6*   GFR: Estimated Creatinine Clearance: 96 mL/min (by C-G formula based on SCr of 0.69 mg/dL). Liver Function Tests: Recent Labs  Lab 08/16/21 0300  AST 1,269*  ALT 372*  ALKPHOS 220*  BILITOT 1.2  PROT 4.8*  ALBUMIN 2.7*   No results for input(s): "LIPASE", "AMYLASE" in the last 168 hours. No results for input(s): "AMMONIA" in the last 168 hours. Coagulation Profile: Recent Labs  Lab 08/16/21 0423  INR 1.3*   Cardiac Enzymes: Recent Labs  Lab 08/16/21 0538  CKTOTAL 384*   BNP (last 3 results) No results for input(s): "PROBNP" in the last 8760 hours. HbA1C: No results for input(s): "HGBA1C" in the last 72 hours. CBG: No results for input(s): "GLUCAP" in the last 168 hours. Lipid Profile: No results for input(s): "CHOL", "HDL", "LDLCALC", "TRIG", "CHOLHDL", "LDLDIRECT" in the last 72 hours. Thyroid Function Tests: No results for input(s): "TSH", "T4TOTAL", "FREET4", "T3FREE", "THYROIDAB" in the last 72 hours. Anemia Panel: Recent Labs    08/16/21 0538  FOLATE 9.5  TIBC 174*  IRON 13*   Sepsis Labs: Recent Labs  Lab 08/16/21 0226 08/16/21 0436  LATICACIDVEN 1.4 1.3    Recent  Results (from the past 240 hour(s))  SARS Coronavirus 2 by RT PCR (hospital order, performed in [facility] hospital lab) *cepheid single result test* Anterior Nasal Swab     Status: None   Collection Time: 08/16/21  2:12 AM   Specimen: Anterior Nasal Swab  Result Value Ref Range Status   SARS Coronavirus 2 by RT PCR NEGATIVE NEGATIVE Final    Comment: (NOTE) SARS-CoV-2 target nucleic acids are NOT DETECTED.  The SARS-CoV-2 RNA is generally detectable in upper and lower respiratory specimens during the acute phase of infection. The lowest concentration of SARS-CoV-2 viral copies this assay can detect is 250 copies / mL. A negative result does not preclude SARS-CoV-2 infection and should not be used as the sole basis for treatment or other patient management decisions.  A negative result may occur with improper specimen collection / handling, submission of specimen other than nasopharyngeal swab, presence of viral mutation(s) within the areas targeted by this assay, and inadequate number of viral copies (<250 copies / mL). A negative result must be combined with clinical observations, patient history, and epidemiological information.  Fact Sheet for Patients:   RoadLapTop.co.za  Fact Sheet for Healthcare Providers: http://kim-miller.com/  This test is not yet approved or  cleared by the [place] FDA and has been authorized for detection and/or diagnosis of SARS-CoV-2 by FDA under an Emergency Use Authorization (EUA).  This EUA will remain in effect (meaning this test can be used) for the duration of the COVID-19 declaration under Section 564(b)(1) of the Act, 21 U.S.C. section 360bbb-3(b)(1), unless the authorization is terminated or revoked sooner.  Performed at [facility], [address], [place], [place] [address]      Radiology Studies: DG CHEST PORT 1 VIEW  Result Date: 08/16/2021 CLINICAL DATA:   46 year old female with history of fever. EXAM: PORTABLE CHEST 1 VIEW COMPARISON:  Chest x-ray 02/10/2021. FINDINGS: Lung volumes are low. No consolidative airspace disease. No pleural effusions. No pneumothorax. No evidence of pulmonary edema. Heart size is mildly enlarged. Upper mediastinal contours are within normal limits. IMPRESSION: 1. No radiographic evidence of acute cardiopulmonary disease. 2. Cardiomegaly. Electronically  Signed   By: Trudie Reed M.D.   On: 08/16/2021 06:41   US Abdomen Limited RUQ (LIVER/GB)  Result Date: 08/16/2021 CLINICAL DATA:  46 year old female with history of elevated liver enzymes. EXAM: ULTRASOUND ABDOMEN LIMITED RIGHT UPPER QUADRANT COMPARISON:  None Available. FINDINGS: Gallbladder: Gallbladder is moderately distended. Gallbladder wall thickness is mildly increased at 4.6 mm. No shadowing gallstones. However, there is a large amount of echogenic nonshadowing material lying dependently in the gallbladder, indicative of a large volume of biliary sludge. No pericholecystic fluid. No sonographic Murphy sign noted by sonographer. Common bile duct: Diameter: 7.7 mm Liver: No focal lesion identified. Diffusely increased hepatic parenchymal echogenicity. Portal vein is patent on color Doppler imaging with normal direction of blood flow towards the liver. Other: None. IMPRESSION: 1. Large volume of biliary sludge in the gallbladder. Gallbladder wall appears mildly thickened. Gallbladder is moderately distended. However, there is no pericholecystic fluid and no sonographic Murphy's sign on examination. Notably, however, there is also common bile duct dilatation (7.7 mm). If there is clinical concern for common bile duct obstruction from biliary sludge or other source, further evaluation with abdominal MRI with and without IV gadolinium with MRCP would be suggested. 2. Hepatic steatosis. Electronically Signed   By: Trudie Reed M.D.   On: 08/16/2021 06:12   DG Foot Complete  Right  Result Date: 08/16/2021 CLINICAL DATA:  History of multiple recent falls with pain. EXAM: RIGHT FOOT COMPLETE - 3+ VIEW COMPARISON:  None Available. FINDINGS: There is an oblique fracture of the mid to distal fifth metatarsal. The remaining bony structures are intact. Calcaneal spurring is noted. Soft tissues are unremarkable. IMPRESSION: Oblique fracture of the mid to distal fifth metatarsal. Electronically Signed   By: Thornell Sartorius M.D.   On: 08/16/2021 04:21   DG Foot Complete Left  Result Date: 08/16/2021 CLINICAL DATA:  History of multiple recent falls with pain. EXAM: LEFT FOOT - COMPLETE 3+ VIEW COMPARISON:  None Available. FINDINGS: There is no evidence of fracture or dislocation. There is no evidence of arthropathy or other focal bone abnormality. Calcaneal spurring is noted. Soft tissues are unremarkable. IMPRESSION: No acute fracture or dislocation. Electronically Signed   By: Thornell Sartorius M.D.   On: 08/16/2021 04:20   DG Tibia/Fibula Right  Result Date: 08/16/2021 CLINICAL DATA:  History of multiple recent falls with pain. EXAM: RIGHT TIBIA AND FIBULA - 2 VIEW COMPARISON:  None Available. FINDINGS: There is no evidence of fracture or other focal bone lesions. Calcaneal spurring is noted. Soft tissues are unremarkable. IMPRESSION: No acute fracture or dislocation. Electronically Signed   By: Thornell Sartorius M.D.   On: 08/16/2021 04:18   CT Head Wo Contrast  Result Date: 08/16/2021 CLINICAL DATA:  Status post trauma. EXAM: CT HEAD WITHOUT CONTRAST TECHNIQUE: Contiguous axial images were obtained from the base of the skull through the vertex without intravenous contrast. RADIATION DOSE REDUCTION: This exam was performed according to the departmental dose-optimization program which includes automated exposure control, adjustment of the mA and/or kV according to patient size and/or use of iterative reconstruction technique. COMPARISON:  September 12, 2008 FINDINGS: Brain: No evidence of  acute infarction, hemorrhage, hydrocephalus, extra-axial collection or mass lesion/mass effect. Vascular: No hyperdense vessel or unexpected calcification. Skull: Normal. Negative for fracture or focal lesion. Sinuses/Orbits: No acute finding. Other: None. IMPRESSION: No acute intracranial abnormality. Electronically Signed   By: Aram Candela M.D.   On: 08/16/2021 04:18   DG Tibia/Fibula Left  Result Date: 08/16/2021 CLINICAL  DATA:  History of multiple recent falls with pain. EXAM: LEFT TIBIA AND FIBULA - 2 VIEW COMPARISON:  None Available. FINDINGS: There is no evidence of fracture or other focal bone lesions. Calcaneal spurring is noted. There are enthesopathic changes at the insertion site of the Achilles tendon. Soft tissues are unremarkable. IMPRESSION: No acute fracture or dislocation. Electronically Signed   By: Thornell Sartorius M.D.   On: 08/16/2021 04:17   DG Sacrum/Coccyx  Result Date: 08/16/2021 CLINICAL DATA:  History of multiple falls with pain. EXAM: SACRUM AND COCCYX - 2+ VIEW COMPARISON:  09/12/2008. FINDINGS: There is no evidence of acute fracture. There is mild ventral displacement of the distal tip of the coccyx which is unchanged from 2010. Mild degenerative changes are present in the lower lumbar spine. IMPRESSION: No acute fracture. Electronically Signed   By: Thornell Sartorius M.D.   On: 08/16/2021 04:16    Scheduled Meds:  folic acid  1 mg Oral Daily   levothyroxine  200 mcg Oral Q0600   multivitamin with minerals  1 tablet Oral Daily   [START ON 08/19/2021] pantoprazole  40 mg Intravenous Q12H   thiamine  100 mg Oral Daily   Or   thiamine  100 mg Intravenous Daily   Continuous Infusions:  magnesium sulfate bolus IVPB 2 g (08/16/21 0736)   pantoprazole       LOS: 0 days   Hughie Closs, MD [facility]  08/16/2021, 8:10 AM   *Please note that this is a verbal dictation therefore any spelling or grammatical errors are due to the "Dragon [employer] One" system  interpretation.  Please page via Amion and do not message via secure chat for urgent patient care matters. Secure chat can be used for non urgent patient care matters.  How to contact the [facility] Attending or Consulting provider 7A - 7P or covering provider during after hours 7P -7A, for this patient?  Check the care team in [facility] and look for a) attending/consulting [facility] provider listed and b) the [facility] team listed. Page or secure chat 7A-7P. Log into [email or web address] and use [place]'s universal password to access. If you do not have the password, please contact the hospital operator. Locate the [facility] provider you are looking for under [facility] and page to a number that you can be directly reached. If you still have difficulty reaching the provider, please page the [facility] (Director on Call) for the Hospitalists listed on amion for assistance.

## 2021-08-16 NOTE — ED Triage Notes (Signed)
Arrives EMS from home with c/o multiple falls and pain worst in legs and arms. Reports she has been taking 600mg  BID Gabapentin prescribed by psychiatrist but has not had any in the last 3 days.
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
## 2021-08-16 NOTE — Assessment & Plan Note (Addendum)
Tm 100.8, unclear source at this point. No other SIRS Lactate 1.3 1. BP trending down, now soft, IVF bolus started by EDP, PRBC transfusion also running 2. CXR ordered and pending 3. UA ordered and pending 4. RUQ [place] ordered and pending 5. BCx pending
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
## 2021-08-16 NOTE — ED Notes (Signed)
This Clinical research associate assisted cleaning pt with Nt Themmy and Management consultant. Placed clean purewick on pt.

## 2021-08-16 NOTE — Assessment & Plan Note (Addendum)
HGB 6.4, macrocytic. ? B12 deficiency due to NO2 abuse?  Or bariatric surgery history. Sounds like she also has active GIB 1. 2u PRBC transfusion 2. Repeat H/H at noon 3. Check B12, check folate 4. IRON, TIBC, and ferritin 5. B12 IM x1 after level drawn 6. GI consult for suspected GIB with reported 1 day h/o melena 7. Starting PPI bolus and GTT 8. Hemoccult ordered

## 2021-08-16 NOTE — Progress Notes (Signed)
PT Cancellation Note  Patient Details Name: Renee [name] MRN: [id number] DOB: 04/06/1975   Cancelled Treatment:    Reason Eval/Treat Not Completed: Medical issues which prohibited therapy;Patient not medically ready (Lab values low (hgb 6.4, k+ 2.7) will follow up at later date/time as schedule allows and pt medically ready.)   Wynn Maudlin, DPT Acute Rehabilitation Services Office [phone] Pager [phone]  08/16/21 11:33 AM

## 2021-08-16 NOTE — Assessment & Plan Note (Addendum)
Admits to drink ~5 days ago. Denies heavy use / abuse though. See transaminitis above. CIWA protocol ordered.

## 2021-08-17 ENCOUNTER — Inpatient Hospital Stay (HOSPITAL_COMMUNITY): Payer: BC Managed Care – PPO | Admitting: Anesthesiology

## 2021-08-17 ENCOUNTER — Encounter (HOSPITAL_COMMUNITY): Payer: Self-pay | Admitting: Family Medicine

## 2021-08-17 ENCOUNTER — Encounter (HOSPITAL_COMMUNITY)
Admission: EM | Disposition: A | Discharge status: Home / Self Care | Payer: Self-pay | Source: Home / Self Care | Attending: Family Medicine

## 2021-08-17 DIAGNOSIS — D649 Anemia, unspecified: Secondary | ICD-10-CM | POA: Diagnosis not present

## 2021-08-17 HISTORY — PX: ESOPHAGOGASTRODUODENOSCOPY: SHX5428

## 2021-08-17 HISTORY — PX: BIOPSY: SHX5522

## 2021-08-17 LAB — CBC
HCT: 23.1 % — ABNORMAL LOW (ref 36.0–46.0)
Hemoglobin: 8 g/dL — ABNORMAL LOW (ref 12.0–15.0)
MCH: 32.9 pg (ref 26.0–34.0)
MCHC: 34.6 g/dL (ref 30.0–36.0)
MCV: 95.1 fL (ref 80.0–100.0)
Platelets: 83 10*3/uL — ABNORMAL LOW (ref 150–400)
RBC: 2.43 MIL/uL — ABNORMAL LOW (ref 3.87–5.11)
RDW: 19.5 % — ABNORMAL HIGH (ref 11.5–15.5)
WBC: 11 10*3/uL — ABNORMAL HIGH (ref 4.0–10.5)
nRBC: 0.5 % — ABNORMAL HIGH (ref 0.0–0.2)

## 2021-08-17 LAB — TYPE AND SCREEN
ABO/RH(D): A POS
Antibody Screen: NEGATIVE
Unit division: 0
Unit division: 0

## 2021-08-17 LAB — BASIC METABOLIC PANEL
Anion gap: 3 — ABNORMAL LOW (ref 5–15)
BUN: 8 mg/dL (ref 6–20)
CO2: 21 mmol/L — ABNORMAL LOW (ref 22–32)
Calcium: 7.4 mg/dL — ABNORMAL LOW (ref 8.9–10.3)
Chloride: 115 mmol/L — ABNORMAL HIGH (ref 98–111)
Creatinine, Ser: 0.56 mg/dL (ref 0.44–1.00)
GFR, Estimated: >60 mL/min (ref >60)
Glucose, Bld: 69 mg/dL — ABNORMAL LOW (ref 70–99)
Potassium: 2.9 mmol/L — ABNORMAL LOW (ref 3.5–5.1)
Sodium: 139 mmol/L (ref 135–145)

## 2021-08-17 LAB — BLOOD CULTURE ID PANEL (REFLEXED) - BCID2

## 2021-08-17 LAB — BPAM RBC
Blood Product Expiration Date: 202307312359
Blood Product Expiration Date: 202308022359
ISSUE DATE / TIME: 202307240451
ISSUE DATE / TIME: 202307240806
Unit Type and Rh: 6200
Unit Type and Rh: 6200

## 2021-08-17 LAB — OCCULT BLOOD X 1 CARD TO LAB, STOOL: Fecal Occult Bld: POSITIVE — AB

## 2021-08-17 LAB — GLUCOSE, CAPILLARY
Glucose-Capillary: 61 mg/dL — ABNORMAL LOW (ref 70–99)
Glucose-Capillary: 98 mg/dL (ref 70–99)

## 2021-08-17 LAB — MAGNESIUM: Magnesium: 1.6 mg/dL — ABNORMAL LOW (ref 1.7–2.4)

## 2021-08-17 SURGERY — EGD (ESOPHAGOGASTRODUODENOSCOPY)
Anesthesia: Monitor Anesthesia Care

## 2021-08-17 MED ORDER — PROPOFOL 10 MG/ML IV BOLUS
INTRAVENOUS | Status: DC | PRN
  Administered 2021-08-17: 30 mg via INTRAVENOUS

## 2021-08-17 MED ORDER — EPHEDRINE SULFATE-NACL 50-0.9 MG/10ML-% IV SOSY
PREFILLED_SYRINGE | INTRAVENOUS | Status: DC | PRN
  Administered 2021-08-17: 10 mg via INTRAVENOUS
  Administered 2021-08-17 (×2): 5 mg via INTRAVENOUS
  Administered 2021-08-17: 15 mg via INTRAVENOUS

## 2021-08-17 MED ORDER — MAGNESIUM SULFATE 2 GM/50ML IV SOLN
2.0000 g | Freq: Once | INTRAVENOUS | Status: AC
Start: 2021-08-17 — End: 2021-08-17
  Administered 2021-08-17: 2 g via INTRAVENOUS
  Filled 2021-08-17: qty 50

## 2021-08-17 MED ORDER — OXYCODONE HCL 5 MG PO TABS
5.0000 mg | ORAL_TABLET | Freq: Four times a day (QID) | ORAL | Status: DC | PRN
  Administered 2021-08-17: 5 mg via ORAL
  Filled 2021-08-17: qty 1

## 2021-08-17 MED ORDER — POTASSIUM CHLORIDE 10 MEQ/100ML IV SOLN
10.0000 meq | INTRAVENOUS | Status: AC
  Administered 2021-08-17 (×3): 10 meq via INTRAVENOUS
  Filled 2021-08-17 (×3): qty 100

## 2021-08-17 MED ORDER — PROPOFOL 500 MG/50ML IV EMUL
INTRAVENOUS | Status: DC | PRN
  Administered 2021-08-17: 150 ug/kg/min via INTRAVENOUS

## 2021-08-17 MED ORDER — DEXTROSE 50 % IV SOLN
12.5000 g | INTRAVENOUS | Status: AC
  Administered 2021-08-17: 12.5 g via INTRAVENOUS
  Filled 2021-08-17: qty 50

## 2021-08-17 MED ORDER — SODIUM CHLORIDE 0.9 % IV SOLN
2.0000 g | INTRAVENOUS | Status: DC
  Administered 2021-08-17 – 2021-08-18 (×2): 2 g via INTRAVENOUS
  Filled 2021-08-17 (×2): qty 20

## 2021-08-17 MED ORDER — SODIUM CHLORIDE 0.9 % IV SOLN: INTRAVENOUS | Status: DC

## 2021-08-17 MED ORDER — BENZOCAINE 10 % MT GEL
Freq: Three times a day (TID) | OROMUCOSAL | Status: DC | PRN
  Filled 2021-08-17: qty 9.4

## 2021-08-17 MED ORDER — POTASSIUM CHLORIDE CRYS ER 20 MEQ PO TBCR
40.0000 meq | EXTENDED_RELEASE_TABLET | ORAL | Status: AC
  Administered 2021-08-17 (×2): 40 meq via ORAL
  Filled 2021-08-17 (×2): qty 2

## 2021-08-17 MED ORDER — SODIUM CHLORIDE 0.9 % IV BOLUS
500.0000 mL | Freq: Once | INTRAVENOUS | Status: AC
  Administered 2021-08-17: 500 mL via INTRAVENOUS

## 2021-08-17 NOTE — Interval H&P Note (Signed)
History and Physical Interval Note:  08/17/2021 11:45 AM  Renee [name]  has presented today for surgery, with the diagnosis of melena, anemia.  The various methods of treatment have been discussed with the patient and family. After consideration of risks, benefits and other options for treatment, the patient has consented to  Procedure(s): ESOPHAGOGASTRODUODENOSCOPY (EGD) (N/A) as a surgical intervention.  The patient's history has been reviewed, patient examined, no change in status, stable for surgery.  I have reviewed the patient's chart and labs.  Questions were answered to the patient's satisfaction.     [name]

## 2021-08-17 NOTE — Op Note (Signed)
[facility] Patient Name: Renee [name] Procedure Date: 08/17/2021 MRN: [id number] Attending MD: Lear Ng , MD Date of Birth: [birth year] CSN: [id number] Age: 46 Admit Type: Inpatient Procedure:                Upper GI endoscopy Indications:              Melena Providers:                Lear Ng, MD, Kary Kos RN, RN,                            Despina Pole, Technician, Dellie Catholic Referring MD:             hospital team Medicines:                Propofol per Anesthesia, Monitored Anesthesia Care Complications:            No immediate complications. Estimated Blood Loss:     Estimated blood loss was minimal. Procedure:                Pre-Anesthesia Assessment:                           - Prior to the procedure, a History and Physical                            was performed, and patient medications and                            allergies were reviewed. The patient's tolerance of                            previous anesthesia was also reviewed. The risks                            and benefits of the procedure and the sedation                            options and risks were discussed with the patient.                            All questions were answered, and informed consent                            was obtained. Prior Anticoagulants: The patient has                            taken no previous anticoagulant or antiplatelet                            agents. ASA Grade Assessment: III - A patient with                            severe systemic disease. After reviewing the risks  and benefits, the patient was deemed in                            satisfactory condition to undergo the procedure.                           After obtaining informed consent, the endoscope was                            passed under direct vision. Throughout the                            procedure, the patient's blood pressure, pulse,  and                            oxygen saturations were monitored continuously. The                            GIF-H190 ([id number] Olympus endoscope was introduced                            through the mouth, and advanced to the second part                            of duodenum. The upper GI endoscopy was                            accomplished without difficulty. The patient                            tolerated the procedure well. Scope In: Scope Out: Findings:      The examined esophagus was normal.      The Z-line was regular and was found 42 cm from the incisors.      One non-bleeding cratered gastric ulcer with no stigmata of bleeding was       found at the anastomosis. The lesion was 6 mm in largest dimension.      Segmental mild inflammation characterized by congestion (edema) and       erythema was found at the gastroesophageal junction and in the cardia.      Segmental moderate inflammation characterized by congestion (edema),       erosions, erythema and serpentine ulcerations was found in the gastric       antrum. Biopsies were taken with a cold forceps for histology. Estimated       blood loss was minimal.      One non-bleeding cratered duodenal ulcer with no stigmata of bleeding       was found in the duodenal bulb. The lesion was 10 mm in largest       dimension.      The exam of the duodenum was otherwise normal.      Evidence of a gastric bypass was found in the gastric fundus. This was       characterized by congestion, edema, erythema and ulceration. Impression:               - Normal esophagus.                           -  Z-line regular, 42 cm from the incisors.                           - Non-bleeding gastric ulcer with no stigmata of                            bleeding.                           - Gastritis.                           - Gastritis. Biopsied.                           - Non-bleeding duodenal ulcer with no stigmata of                             bleeding.                           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 Moderate Sedation:      N/A - MAC procedure Recommendation:           - Clear liquid diet.                           - Await pathology results.                           - Give Protonix (pantoprazole): 8 mg/hr IV by                            continuous infusion.                           - Avoid NSAIDs. Procedure Code(s):        --- Professional ---                           [phone], Esophagogastroduodenoscopy, flexible,                            transoral; with biopsy, single or multiple Diagnosis Code(s):        --- Professional ---                           K92.1, Melena (includes Hematochezia)                           K26.9, Duodenal ulcer, unspecified as acute or                            chronic, without hemorrhage or perforation  K25.9, Gastric ulcer, unspecified as acute or                            chronic, without hemorrhage or perforation                           K29.70, Gastritis, unspecified, without bleeding                           Z98.84, Bariatric surgery status CPT copyright 2019 [employer] Association. All rights reserved. The codes documented in this report are preliminary and upon coder review may  be revised to meet current compliance requirements. Lear Ng, MD 08/17/2021 12:24:16 PM This report has been signed electronically. Number of Addenda: 0

## 2021-08-17 NOTE — Progress Notes (Signed)
PT Cancellation Note  Patient Details Name: Renee [name] MRN: [id number] DOB: 23-Jan-1976   Cancelled Treatment:    Reason Eval/Treat Not Completed: Patient at procedure or test/unavailable, will check back  as schedule allows. Blanchard Kelch PT Acute Rehabilitation Services Office [phone] Weekend pager-[phone]    Rada Hay 08/17/2021, 10:12 AM

## 2021-08-17 NOTE — Progress Notes (Signed)
OT Cancellation Note  Patient Details Name: Renee [name] MRN: [id number] DOB: Jan 03, 1976   Cancelled Treatment:    Reason Eval/Treat Not Completed: Patient at procedure or test/ unavailable Patient is off floor at procedure at this time. OT to continue to follow and check back as schedule will allow.  Sharyn Blitz OTR/L, MS Acute Rehabilitation Department Office# [phone] Pager# [phone]  08/17/2021, 10:17 AM

## 2021-08-17 NOTE — Progress Notes (Signed)
   08/17/21 0506  Assess: MEWS Score  Temp 98.4 F (36.9 C)  BP (!) 78/50  MAP (mmHg) (!) 59  Pulse Rate 64  Resp 15  SpO2 95 %  O2 Device Room Air   Paitent had 7x LBM brown medium. J Daniles informed and new orders were made. Kept monitored.
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
## 2021-08-17 NOTE — Transfer of Care (Signed)
Immediate Anesthesia Transfer of Care Note  Patient: Renee [name]  Procedure(s) Performed: ESOPHAGOGASTRODUODENOSCOPY (EGD) BIOPSY  Patient Location: Endoscopy Unit  Anesthesia Type:MAC  Level of Consciousness: awake  Airway & Oxygen Therapy: Patient Spontanous Breathing and Patient connected to face mask oxygen  Post-op Assessment: Report given to RN and Post -op Vital signs reviewed and stable  Post vital signs: Reviewed and stable  Last Vitals:  Vitals Value Taken Time  BP 84/40 08/17/21 1216  Temp    Pulse 63 08/17/21 1220  Resp 18 08/17/21 1220  SpO2 100 % 08/17/21 1220  Vitals shown include unvalidated device data.  Last Pain:  Vitals:   08/17/21 1039  TempSrc: Temporal  PainSc: 0-No pain      Patients Stated Pain Goal: 6 (00/37/94 4461)  Complications: No notable events documented.
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
## 2021-08-17 NOTE — Progress Notes (Addendum)
PHARMACY - PHYSICIAN COMMUNICATION CRITICAL VALUE ALERT - BLOOD CULTURE IDENTIFICATION (BCID)  Renee [name] is an 46 y.o. female who presented to [facility] on 08/16/2021 with a chief complaint of falls and LE bruising.  One of four blood culture bottles collected on 08/16/21 has GNR (BCID = Ecoli).  Name of physician (or Provider) Contacted: Dr. Jacqulyn Bath  Current antibiotics: none  Changes to prescribed antibiotics recommended:  - start ceftriaxone 2 gm IV q24h  Of note, pt has PCN listed in allergy field but received ancef in the past.  Results for orders placed or performed during the hospital encounter of 08/16/21  Blood Culture ID Panel (Reflexed) (Collected: 08/16/2021  2:24 AM)  Result Value Ref Range   Enterococcus faecalis NOT DETECTED NOT DETECTED   Enterococcus Faecium NOT DETECTED NOT DETECTED   Listeria monocytogenes NOT DETECTED NOT DETECTED   Staphylococcus species NOT DETECTED NOT DETECTED   Staphylococcus aureus (BCID) NOT DETECTED NOT DETECTED   Staphylococcus epidermidis NOT DETECTED NOT DETECTED   Staphylococcus lugdunensis NOT DETECTED NOT DETECTED   Streptococcus species NOT DETECTED NOT DETECTED   Streptococcus agalactiae NOT DETECTED NOT DETECTED   Streptococcus pneumoniae NOT DETECTED NOT DETECTED   Streptococcus pyogenes NOT DETECTED NOT DETECTED   A.calcoaceticus-baumannii NOT DETECTED NOT DETECTED   Bacteroides fragilis NOT DETECTED NOT DETECTED   Enterobacterales DETECTED (A) NOT DETECTED   Enterobacter cloacae complex NOT DETECTED NOT DETECTED   Escherichia coli DETECTED (A) NOT DETECTED   Klebsiella aerogenes NOT DETECTED NOT DETECTED   Klebsiella oxytoca NOT DETECTED NOT DETECTED   Klebsiella pneumoniae NOT DETECTED NOT DETECTED   Proteus species NOT DETECTED NOT DETECTED   Salmonella species NOT DETECTED NOT DETECTED   Serratia marcescens NOT DETECTED NOT DETECTED   Haemophilus influenzae NOT DETECTED NOT DETECTED   Neisseria meningitidis NOT  DETECTED NOT DETECTED   Pseudomonas aeruginosa NOT DETECTED NOT DETECTED   Stenotrophomonas maltophilia NOT DETECTED NOT DETECTED   Candida albicans NOT DETECTED NOT DETECTED   Candida auris NOT DETECTED NOT DETECTED   Candida glabrata NOT DETECTED NOT DETECTED   Candida krusei NOT DETECTED NOT DETECTED   Candida parapsilosis NOT DETECTED NOT DETECTED   Candida tropicalis NOT DETECTED NOT DETECTED   Cryptococcus neoformans/gattii NOT DETECTED NOT DETECTED   CTX-M ESBL NOT DETECTED NOT DETECTED   Carbapenem resistance IMP NOT DETECTED NOT DETECTED   Carbapenem resistance KPC NOT DETECTED NOT DETECTED   Carbapenem resistance NDM NOT DETECTED NOT DETECTED   Carbapenem resist OXA 48 LIKE NOT DETECTED NOT DETECTED   Carbapenem resistance VIM NOT DETECTED NOT DETECTED    [name] 08/17/2021  2:43 PM

## 2021-08-17 NOTE — Anesthesia Procedure Notes (Signed)
Procedure Name: MAC Date/Time: 08/17/2021 11:50 AM  Performed by: Claudia Desanctis, CRNAPre-anesthesia Checklist: Patient identified, Emergency Drugs available, Suction available and Patient being monitored Patient Re-evaluated:Patient Re-evaluated prior to induction Oxygen Delivery Method: Simple face mask

## 2021-08-17 NOTE — Progress Notes (Signed)
PROGRESS NOTE    Renee [name]  AVW:[id number] DOB: 31-Dec-1975 DOA: 08/16/2021 PCP: Oneita Hurt, No   Brief Narrative:  HPI: Renee [name] is a 46 y.o. female with medical history significant of EtOH abuse, ADD, HTN, chronic pain syndrome, prior bariatric surgery.   She states she has been falling frequently for the past 3 days.  She has bruising in multiple places primarily her lower legs and feet.  She has also hit her head and her coccyx.  She rates her pain as a 6 out of 10.  She is able to bear weight in her lower extremities but can only ambulate with assistance.  She denies recent alcohol use but has a history of alcohol abuse in the past.  Pt DOES admit to using EtOH again, last drink 5 days ago.   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   EMS reports seeing multiple cans of whipped cream at her home, perhaps suggesting nitrous oxide abuse.   Patient: Admits to 1 day history of melena.  Also admits to splitting a 5th of tequila with her husband x5 days ago, no EtOH for several weeks prior she states.  Denies ongoing abuse.  Denies any NO2 abuse, not sure what EMS talking about with the cans of whip cream.  Assessment & Plan:   Principal Problem:   Symptomatic anemia Active Problems:   Transaminitis   Hypokalemia   Fracture of 5th metatarsal   Fever   Hypothyroidism   ADD (attention deficit disorder)   Alcohol dependence in remission (HCC)   Chronic pain syndrome   GI bleeding  Symptomatic anemia/acute blood loss anemia/upper GI bleed/nonbleeding gastric and duodenal ulcer/gastritis: Patient with history of melena and hemoglobin 6.4 upon presentation.  2 unit PRBC transfused.  Has history of B12 deficiency as well but B12 level is above normal here.  GI on board.  Underwent EGD today which shows nonbleeding gastric and duodenal ulcer as well as gastritis, biopsy taken.  She remains on Protonix drip.  Management per GI.  No episodes of melena or  hematemesis here.  Alcoholic hepatitis: AST greater than ALT consistent with alcoholic hepatitis but AST much more elevated than ALT.  Unsure whether there is another etiology.  GI has been consulted and we will defer to them.  Follow LFTs on daily basis.  Ultrasound abdomen did not show any liver pathology.  Viral hepatitis panel negative.  Gallstone/possible CBD stone/sludge: Ultrasound abdomen shows gallstones but no cholecystitis.  There is some concern of possible CBD sludge as there is some dilatation of the CBD as well.  Will defer to GI if MRCP or ERCP is indicated.  Hypotension: No previous history of hypertension.  Unknown blood pressure readings at home.  Blood pressure has remained low here.  Likely due to cirrhosis.  Lactic acid normal.  Patient is asymptomatic.   Frequent falls: PT OT consulted.  EtOH abuse: Last drink was 7 days ago.  She states that she was sober for many years until 2021 when she started drinking again but she drinks only twice a month and drinks only very little.  Continue CIWA protocol.  No withdrawal symptoms.  Hypomagnesemia: Low again today.  We will replace.  Hypoglycemia: Likely due to poor intake due to alcohol abuse.  Monitor closely.  Hypokalemia: 2.9 this morning, she was replaced yesterday but not much improvement in potassium, we will aggressively replace today.  Thrombocytopenia: Further drop in platelets.  Likely secondary to chronic alcohol abuse.  Monitor closely.  No signs of active bleeding.  Fever: 100.8 fever in the ED, no clear source of infection.  Chest x-ray negative.  UA unremarkable.  She has remained afebrile since then.    Fracture of left fifth metatarsal: EDP placed patient in cam boot.  Chronic pain syndrome: Hold baclofen for the moment due to hypotension.  ADD: Hold Adderall.  Hypothyroidism: Continue home Synthroid.  DVT prophylaxis: SCDs Start: 08/16/21 0540   Code Status: Full Code  Family Communication: Sister  present at bedside.  Plan of care discussed with both of them and they verbalized understanding and agreed with it.    Estimated body mass index is 26.23 kg/m as calculated from the following:   Height as of this encounter: 5' 7.5" (1.715 m).   Weight as of this encounter: 77.1 kg.    Nutritional Assessment: Body mass index is 26.23 kg/m.Marland Kitchen Seen by dietician.  I agree with the assessment and plan as outlined below: Nutrition Status:        . Skin Assessment: I have examined the patient's skin and I agree with the wound assessment as performed by the wound care RN as outlined below:    Consultants:  GI  Procedures:  None  Antimicrobials:  Anti-infectives (From admission, onward)    None         Subjective:  Patient seen and examined.  No complaints.  Slightly lethargic but she says that she is feeling well other than generalized weakness.  Sister at the bedside.  Objective: Vitals:   08/17/21 1223 08/17/21 1225 08/17/21 1230 08/17/21 1240  BP: (!) 86/41  (!) 85/45 (!) 87/50  Pulse: 62 60 68 66  Resp: 19 17 16 16   Temp: 97.7 F (36.5 C)     TempSrc: Temporal     SpO2: 98% 91% 95% 100%  Weight:      Height:        Intake/Output Summary (Last 24 hours) at 08/17/2021 1250 Last data filed at 08/17/2021 1100 Gross per 24 hour  Intake 906.61 ml  Output --  Net 906.61 ml   Filed Weights   08/16/21 0145 08/17/21 1039  Weight: 77.1 kg 77.1 kg    Examination:  General exam: Appears calm and comfortable  Respiratory system: Clear to auscultation. Respiratory effort normal. Cardiovascular system: S1 & S2 heard, RRR. No JVD, murmurs, rubs, gallops or clicks. No pedal edema. Gastrointestinal system: Abdomen is nondistended, soft and nontender. No organomegaly or masses felt. Normal bowel sounds heard. Central nervous system: Alert and oriented. No focal neurological deficits. Extremities: Symmetric 5 x 5 power. Skin: Multiple bruises in bilateral lower  extremities. Psychiatry: Judgement and insight appear poor.  Data Reviewed: I have personally reviewed following labs and imaging studies  CBC: Recent Labs  Lab 08/16/21 0226 08/16/21 1200 08/17/21 0542  WBC 7.4  --  11.0*  NEUTROABS 6.2  --   --   HGB 6.4* 8.7* 8.0*  HCT 19.1* 24.9* 23.1*  MCV 103.2*  --  95.1  PLT 140*  --  83*    Basic Metabolic Panel: Recent Labs  Lab 08/16/21 0226 08/16/21 0538 08/16/21 1200 08/17/21 0542  NA 139  --  139 139  K 2.7*  --  2.8* 2.9*  CL 110  --  114* 115*  CO2 22  --  21* 21*  GLUCOSE 68*  --  67* 69*  BUN 12  --  10 8  CREATININE 0.69  --  0.63 0.56  CALCIUM 8.3*  --  7.3* 7.4*  MG  --  1.6*  --  1.6*    GFR: Estimated Creatinine Clearance: 96 mL/min (by C-G formula based on SCr of 0.56 mg/dL). Liver Function Tests: Recent Labs  Lab 08/16/21 0300 08/16/21 1200  AST 1,269* 586*  ALT 372* 276*  ALKPHOS 220* 185*  BILITOT 1.2 1.9*  PROT 4.8* 4.5*  ALBUMIN 2.7* 2.4*    No results for input(s): "LIPASE", "AMYLASE" in the last 168 hours. No results for input(s): "AMMONIA" in the last 168 hours. Coagulation Profile: Recent Labs  Lab 08/16/21 0423  INR 1.3*    Cardiac Enzymes: Recent Labs  Lab 08/16/21 0538  CKTOTAL 384*    BNP (last 3 results) No results for input(s): "PROBNP" in the last 8760 hours. HbA1C: No results for input(s): "HGBA1C" in the last 72 hours. CBG: Recent Labs  Lab 08/16/21 1549 08/17/21 0724 08/17/21 0837  GLUCAP 81 61* 98   Lipid Profile: No results for input(s): "CHOL", "HDL", "LDLCALC", "TRIG", "CHOLHDL", "LDLDIRECT" in the last 72 hours. Thyroid Function Tests: Recent Labs    08/16/21 0538  TSH 66.996*   Anemia Panel: Recent Labs    08/16/21 0538  VITAMINB12 1,178*  FOLATE 9.5  FERRITIN 426*  TIBC 174*  IRON 13*    Sepsis Labs: Recent Labs  Lab 08/16/21 0226 08/16/21 0436  LATICACIDVEN 1.4 1.3     Recent Results (from the past 240 hour(s))  SARS  Coronavirus 2 by RT PCR (hospital order, performed in [facility] hospital lab) *cepheid single result test* Anterior Nasal Swab     Status: None   Collection Time: 08/16/21  2:12 AM   Specimen: Anterior Nasal Swab  Result Value Ref Range Status   SARS Coronavirus 2 by RT PCR NEGATIVE NEGATIVE Final    Comment: (NOTE) SARS-CoV-2 target nucleic acids are NOT DETECTED.  The SARS-CoV-2 RNA is generally detectable in upper and lower respiratory specimens during the acute phase of infection. The lowest concentration of SARS-CoV-2 viral copies this assay can detect is 250 copies / mL. A negative result does not preclude SARS-CoV-2 infection and should not be used as the sole basis for treatment or other patient management decisions.  A negative result may occur with improper specimen collection / handling, submission of specimen other than nasopharyngeal swab, presence of viral mutation(s) within the areas targeted by this assay, and inadequate number of viral copies (<250 copies / mL). A negative result must be combined with clinical observations, patient history, and epidemiological information.  Fact Sheet for Patients:   RoadLapTop.co.za  Fact Sheet for Healthcare Providers: http://kim-miller.com/  This test is not yet approved or  cleared by the [place] FDA and has been authorized for detection and/or diagnosis of SARS-CoV-2 by FDA under an Emergency Use Authorization (EUA).  This EUA will remain in effect (meaning this test can be used) for the duration of the COVID-19 declaration under Section 564(b)(1) of the Act, 21 U.S.C. section 360bbb-3(b)(1), unless the authorization is terminated or revoked sooner.  Performed at [facility], [address], [place], [place] [address]   Blood culture (routine x 2)     Status: None (Preliminary result)   Collection Time: 08/16/21  2:24 AM   Specimen: BLOOD  Result  Value Ref Range Status   Specimen Description   Final    BLOOD LEFT ANTECUBITAL Performed at [facility], [address], [place], [place] [address]    Special Requests   Final  BOTTLES DRAWN AEROBIC AND ANAEROBIC Blood Culture results may not be optimal due to an excessive volume of blood received in culture bottles Performed at [facility], [address] [place], [place] [address]    Culture   Final    NO GROWTH 1 DAY Performed at [facility], [address] [place], [place] [address]    Report Status PENDING  Incomplete  Blood culture (routine x 2)     Status: None (Preliminary result)   Collection Time: 08/16/21  2:26 AM   Specimen: BLOOD  Result Value Ref Range Status   Specimen Description   Final    BLOOD RIGHT ANTECUBITAL Performed at [facility], [address] [place], [place] [address]    Special Requests   Final    BOTTLES DRAWN AEROBIC AND ANAEROBIC Blood Culture adequate volume Performed at [facility], [address] [place], [place] [address]    Culture   Final    NO GROWTH 1 DAY Performed at [facility], [address] [place], [place] [address]    Report Status PENDING  Incomplete     Radiology Studies: DG CHEST PORT 1 VIEW  Result Date: 08/16/2021 CLINICAL DATA:  46 year old female with history of fever. EXAM: PORTABLE CHEST 1 VIEW COMPARISON:  Chest x-ray 02/10/2021. FINDINGS: Lung volumes are low. No consolidative airspace disease. No pleural effusions. No pneumothorax. No evidence of pulmonary edema. Heart size is mildly enlarged. Upper mediastinal contours are within normal limits. IMPRESSION: 1. No radiographic evidence of acute cardiopulmonary disease. 2. Cardiomegaly. Electronically Signed   By: Trudie Reedaniel  Entrikin M.D.   On: 08/16/2021 06:41   US Abdomen Limited RUQ (LIVER/GB)  Result Date: 08/16/2021 CLINICAL DATA:  46 year old female with history of  elevated liver enzymes. EXAM: ULTRASOUND ABDOMEN LIMITED RIGHT UPPER QUADRANT COMPARISON:  None Available. FINDINGS: Gallbladder: Gallbladder is moderately distended. Gallbladder wall thickness is mildly increased at 4.6 mm. No shadowing gallstones. However, there is a large amount of echogenic nonshadowing material lying dependently in the gallbladder, indicative of a large volume of biliary sludge. No pericholecystic fluid. No sonographic Murphy sign noted by sonographer. Common bile duct: Diameter: 7.7 mm Liver: No focal lesion identified. Diffusely increased hepatic parenchymal echogenicity. Portal vein is patent on color Doppler imaging with normal direction of blood flow towards the liver. Other: None. IMPRESSION: 1. Large volume of biliary sludge in the gallbladder. Gallbladder wall appears mildly thickened. Gallbladder is moderately distended. However, there is no pericholecystic fluid and no sonographic Murphy's sign on examination. Notably, however, there is also common bile duct dilatation (7.7 mm). If there is clinical concern for common bile duct obstruction from biliary sludge or other source, further evaluation with abdominal MRI with and without IV gadolinium with MRCP would be suggested. 2. Hepatic steatosis. Electronically Signed   By: Trudie Reedaniel  Entrikin M.D.   On: 08/16/2021 06:12   DG Foot Complete Right  Result Date: 08/16/2021 CLINICAL DATA:  History of multiple recent falls with pain. EXAM: RIGHT FOOT COMPLETE - 3+ VIEW COMPARISON:  None Available. FINDINGS: There is an oblique fracture of the mid to distal fifth metatarsal. The remaining bony structures are intact. Calcaneal spurring is noted. Soft tissues are unremarkable. IMPRESSION: Oblique fracture of the mid to distal fifth metatarsal. Electronically Signed   By: Thornell SartoriusLaura  Taylor M.D.   On: 08/16/2021 04:21   DG Foot Complete Left  Result Date: 08/16/2021 CLINICAL DATA:  History of multiple recent falls with pain.  EXAM: LEFT FOOT -  COMPLETE 3+ VIEW COMPARISON:  None Available. FINDINGS: There is no evidence of fracture or dislocation. There is no evidence of arthropathy or other focal bone abnormality. Calcaneal spurring is noted. Soft tissues are unremarkable. IMPRESSION: No acute fracture or dislocation. Electronically Signed   By: Thornell Sartorius M.D.   On: 08/16/2021 04:20   DG Tibia/Fibula Right  Result Date: 08/16/2021 CLINICAL DATA:  History of multiple recent falls with pain. EXAM: RIGHT TIBIA AND FIBULA - 2 VIEW COMPARISON:  None Available. FINDINGS: There is no evidence of fracture or other focal bone lesions. Calcaneal spurring is noted. Soft tissues are unremarkable. IMPRESSION: No acute fracture or dislocation. Electronically Signed   By: Thornell Sartorius M.D.   On: 08/16/2021 04:18   CT Head Wo Contrast  Result Date: 08/16/2021 CLINICAL DATA:  Status post trauma. EXAM: CT HEAD WITHOUT CONTRAST TECHNIQUE: Contiguous axial images were obtained from the base of the skull through the vertex without intravenous contrast. RADIATION DOSE REDUCTION: This exam was performed according to the departmental dose-optimization program which includes automated exposure control, adjustment of the mA and/or kV according to patient size and/or use of iterative reconstruction technique. COMPARISON:  September 12, 2008 FINDINGS: Brain: No evidence of acute infarction, hemorrhage, hydrocephalus, extra-axial collection or mass lesion/mass effect. Vascular: No hyperdense vessel or unexpected calcification. Skull: Normal. Negative for fracture or focal lesion. Sinuses/Orbits: No acute finding. Other: None. IMPRESSION: No acute intracranial abnormality. Electronically Signed   By: Aram Candela M.D.   On: 08/16/2021 04:18   DG Tibia/Fibula Left  Result Date: 08/16/2021 CLINICAL DATA:  History of multiple recent falls with pain. EXAM: LEFT TIBIA AND FIBULA - 2 VIEW COMPARISON:  None Available. FINDINGS: There is no evidence of fracture or other  focal bone lesions. Calcaneal spurring is noted. There are enthesopathic changes at the insertion site of the Achilles tendon. Soft tissues are unremarkable. IMPRESSION: No acute fracture or dislocation. Electronically Signed   By: Thornell Sartorius M.D.   On: 08/16/2021 04:17   DG Sacrum/Coccyx  Result Date: 08/16/2021 CLINICAL DATA:  History of multiple falls with pain. EXAM: SACRUM AND COCCYX - 2+ VIEW COMPARISON:  09/12/2008. FINDINGS: There is no evidence of acute fracture. There is mild ventral displacement of the distal tip of the coccyx which is unchanged from 2010. Mild degenerative changes are present in the lower lumbar spine. IMPRESSION: No acute fracture. Electronically Signed   By: Thornell Sartorius M.D.   On: 08/16/2021 04:16    Scheduled Meds:  [WUJ Hold] folic acid  1 mg Oral Daily   [MAR Hold] levothyroxine  200 mcg Oral Q0600   [MAR Hold] multivitamin with minerals  1 tablet Oral Daily   [MAR Hold] thiamine  100 mg Oral Daily   Or   [MAR Hold] thiamine  100 mg Intravenous Daily   Continuous Infusions:  sodium chloride 20 mL/hr at 08/17/21 1146   sodium chloride     pantoprazole Stopped (08/17/21 1135)   [MAR Hold] potassium chloride 10 mEq (08/17/21 0929)     LOS: 1 day   Hughie Closs, MD [facility]  08/17/2021, 12:50 PM   *Please note that this is a verbal dictation therefore any spelling or grammatical errors are due to the "Dragon Medical One" system interpretation.  Please page via Amion and do not message via secure chat for urgent patient care matters. Secure chat can be used for non urgent patient care matters.  How to contact the [facility] Attending  or Consulting provider 7A - 7P or covering provider during after hours 7P -7A, for this patient?  Check the care team in [facility] and look for a) attending/consulting [facility] provider listed and b) the [facility] team listed. Page or secure chat 7A-7P. Log into [email or web address] and use 's universal password to access. If you  do not have the password, please contact the hospital operator. Locate the [facility] provider you are looking for under [facility] and page to a number that you can be directly reached. If you still have difficulty reaching the provider, please page the [facility] (Director on Call) for the Hospitalists listed on amion for assistance.

## 2021-08-17 NOTE — Anesthesia Postprocedure Evaluation (Signed)
Anesthesia Post Note  Patient: Renee [name]  Procedure(s) Performed: ESOPHAGOGASTRODUODENOSCOPY (EGD) BIOPSY     Patient location during evaluation: PACU Anesthesia Type: MAC Level of consciousness: awake and alert and oriented Pain management: pain level controlled Vital Signs Assessment: post-procedure vital signs reviewed and stable Respiratory status: spontaneous breathing, nonlabored ventilation and respiratory function stable Cardiovascular status: stable and blood pressure returned to baseline Postop Assessment: no apparent nausea or vomiting Anesthetic complications: no   No notable events documented.  Last Vitals:  Vitals:   08/17/21 1230 08/17/21 1240  BP: (!) 85/45 (!) 87/50  Pulse: 68 66  Resp: 16 16  Temp:    SpO2: 95% 100%    Last Pain:  Vitals:   08/17/21 1240  TempSrc:   PainSc: 0-No pain                 Arnaldo Heffron A.

## 2021-08-18 ENCOUNTER — Inpatient Hospital Stay (HOSPITAL_COMMUNITY): Payer: BC Managed Care – PPO

## 2021-08-18 ENCOUNTER — Encounter (HOSPITAL_COMMUNITY): Payer: Self-pay | Admitting: Gastroenterology

## 2021-08-18 DIAGNOSIS — F988 Other specified behavioral and emotional disorders with onset usually occurring in childhood and adolescence: Secondary | ICD-10-CM | POA: Diagnosis not present

## 2021-08-18 DIAGNOSIS — D649 Anemia, unspecified: Secondary | ICD-10-CM | POA: Diagnosis not present

## 2021-08-18 DIAGNOSIS — B9620 Unspecified Escherichia coli [E. coli] as the cause of diseases classified elsewhere: Secondary | ICD-10-CM

## 2021-08-18 DIAGNOSIS — S92351A Displaced fracture of fifth metatarsal bone, right foot, initial encounter for closed fracture: Secondary | ICD-10-CM | POA: Diagnosis not present

## 2021-08-18 DIAGNOSIS — G894 Chronic pain syndrome: Secondary | ICD-10-CM | POA: Diagnosis not present

## 2021-08-18 DIAGNOSIS — K922 Gastrointestinal hemorrhage, unspecified: Secondary | ICD-10-CM

## 2021-08-18 LAB — COMPREHENSIVE METABOLIC PANEL
ALT: 141 U/L — ABNORMAL HIGH (ref 0–44)
AST: 88 U/L — ABNORMAL HIGH (ref 15–41)
Albumin: 2.2 g/dL — ABNORMAL LOW (ref 3.5–5.0)
Alkaline Phosphatase: 151 U/L — ABNORMAL HIGH (ref 38–126)
Anion gap: 4 — ABNORMAL LOW (ref 5–15)
BUN: 5 mg/dL — ABNORMAL LOW (ref 6–20)
CO2: 19 mmol/L — ABNORMAL LOW (ref 22–32)
Calcium: 7.5 mg/dL — ABNORMAL LOW (ref 8.9–10.3)
Chloride: 121 mmol/L — ABNORMAL HIGH (ref 98–111)
Creatinine, Ser: 0.54 mg/dL (ref 0.44–1.00)
GFR, Estimated: >60 mL/min (ref >60)
Glucose, Bld: 96 mg/dL (ref 70–99)
Potassium: 3.9 mmol/L (ref 3.5–5.1)
Sodium: 144 mmol/L (ref 135–145)
Total Bilirubin: 0.6 mg/dL (ref 0.3–1.2)
Total Protein: 4.6 g/dL — ABNORMAL LOW (ref 6.5–8.1)

## 2021-08-18 LAB — LIPASE, BLOOD: Lipase: 62 U/L — ABNORMAL HIGH (ref 11–51)

## 2021-08-18 LAB — SURGICAL PATHOLOGY

## 2021-08-18 NOTE — Progress Notes (Signed)
PT Cancellation Note  Patient Details Name: Renee [name] MRN: [id number] DOB: 12-05-1975   Cancelled Treatment:    Reason Eval/Treat Not Completed: Other (comment) (pt declined PT at this time, she stated she wants to eat right now. Will check back later today.)  Tamala Ser PT 08/18/2021  Acute Rehabilitation Services  Office [phone]

## 2021-08-18 NOTE — Evaluation (Signed)
Occupational Therapy Evaluation Patient Details Name: Renee [name] MRN: [id number] DOB: 01-31-1975 Today's Date: 08/18/2021   History of Present Illness 46 y.o. female admitted with frequent falls, L 5th metatarsal fracture, anemia, EGD showed gastritis and nonbleeding ulcers. Pt with medical history significant of EtOH abuse, ADD, HTN, chronic pain syndrome, prior bariatric surgery.   Clinical Impression   Renee [name] is a 46 year old woman admitted to hospital with above medical history. On evaluation she is able to perform her ADLs and ambulation in room with supervision for safety. She had no overt loss of balance. She needed assistance to manage the IV pole. She reports her significant other works during the day and that she is alone during the day. From a functional standpoint she appears close to her baseline.         Recommendations for follow up therapy are one component of a multi-disciplinary discharge planning process, led by the attending physician.  Recommendations may be updated based on patient status, additional functional criteria and insurance authorization.   Follow Up Recommendations  No OT follow up    Assistance Recommended at Discharge PRN  Patient can return home with the following Assistance with cooking/housework;Help with stairs or ramp for entrance;Assist for transportation    Functional Status Assessment  Patient has had a recent decline in their functional status and demonstrates the ability to make significant improvements in function in a reasonable and predictable amount of time.  Equipment Recommendations  None recommended by OT    Recommendations for Other Services       Precautions / Restrictions Precautions Precautions: Fall Restrictions Weight Bearing Restrictions: No      Mobility Bed Mobility Overal bed mobility: Modified Independent                  Transfers Overall transfer level: Needs assistance                  General transfer comment: Supervision - assistance for IVp ole.      Balance Overall balance assessment: Mild deficits observed, not formally tested, History of Falls                                         ADL either performed or assessed with clinical judgement   ADL Overall ADL's : Needs assistance/impaired Eating/Feeding: Independent   Grooming: Supervision/safety;Standing   Upper Body Bathing: Supervision/ safety   Lower Body Bathing: Supervison/ safety   Upper Body Dressing : Supervision/safety   Lower Body Dressing: Supervision/safety;Sit to/from stand Lower Body Dressing Details (indicate cue type and reason): able to don socks at edge of bed Toilet Transfer: Supervision/safety;Ambulation;Grab bars   Toileting- Clothing Manipulation and Hygiene: Supervision/safety       Functional mobility during ADLs: Supervision/safety General ADL Comments: Supervision to ambulate in room. Assistance for IV pole. patient able to perform ADLs with supervision. She is mildly unsteady but no overt loss of balance.     Vision Baseline Vision/History: 1 Wears glasses Patient Visual Report: No change from baseline       Perception     Praxis      Pertinent Vitals/Pain Pain Assessment Pain Assessment: No/denies pain     Hand Dominance     Extremity/Trunk Assessment Upper Extremity Assessment Upper Extremity Assessment: Overall WFL for tasks assessed   Lower Extremity Assessment Lower Extremity Assessment: Defer to PT evaluation  Cervical / Trunk Assessment Cervical / Trunk Assessment: Normal   Communication Communication Communication: No difficulties   Cognition Arousal/Alertness: Awake/alert Behavior During Therapy: WFL for tasks assessed/performed Overall Cognitive Status: Within Functional Limits for tasks assessed                                                  Home Living Family/patient expects to be  discharged to:: Private residence Living Arrangements: Alone Available Help at Discharge: Family;Available PRN/intermittently Type of Home: House Home Access: Stairs to enter Entrance Stairs-Number of Steps: 2   Home Layout: One level                          Prior Functioning/Environment Prior Level of Function : Independent/Modified Independent             Mobility Comments: no device ADLs Comments: independent        OT Problem List:        OT Treatment/Interventions:      OT Goals(Current goals can be found in the care plan section) Acute Rehab OT Goals OT Goal Formulation: All assessment and education complete, DC therapy  OT Frequency:      Co-evaluation              AM-PAC OT "6 Clicks" Daily Activity     Outcome Measure Help from another person eating meals?: None Help from another person taking care of personal grooming?: None Help from another person toileting, which includes using toliet, bedpan, or urinal?: None Help from another person bathing (including washing, rinsing, drying)?: None Help from another person to put on and taking off regular upper body clothing?: None Help from another person to put on and taking off regular lower body clothing?: None 6 Click Score: 24   End of Session Equipment Utilized During Treatment: Gait belt;Rolling walker (2 wheels) Nurse Communication: Mobility status  Activity Tolerance: Patient tolerated treatment well Patient left: in chair;with call bell/phone within reach;with chair alarm set  OT Visit Diagnosis: Repeated falls (R29.6)                Time: 8099-8338 OT Time Calculation (min): 19 min Charges:  OT General Charges $OT Visit: 1 Visit OT Evaluation $OT Eval Low Complexity: 1 Low  Renee [name], OTR/L Acute Care Rehab Services  Office [phone] Pager: [phone]   Kelli Churn 08/18/2021, 12:50 PM

## 2021-08-18 NOTE — Evaluation (Signed)
Physical Therapy Evaluation Patient Details Name: Renee [name] MRN: [id number] DOB: [birth year] Today's Date: 08/18/2021  History of Present Illness  46 y.o. female admitted with frequent falls, R  5th metatarsal fracture, anemia, EGD showed gastritis and nonbleeding ulcers. Pt with medical history significant of EtOH abuse, ADD, HTN, chronic pain syndrome, prior bariatric surgery.  Clinical Impression  Pt admitted with above diagnosis. Pt ambulated 12' x 2 without an assistive device. Instructed pt in use of CAM walker boot for RLE, she's not been wearing it when getting up to walk to the bathroom. Pt mildly unsteady with ambulation but no overt loss of balance. She declined use of an assistive device. Ambulation distance limited by pt feeling "lethargic". HHPT recommended for home safety assessment.  Pt currently with functional limitations due to the deficits listed below (see PT Problem List). Pt will benefit from skilled PT to increase their independence and safety with mobility to allow discharge to the venue listed below.          Recommendations for follow up therapy are one component of a multi-disciplinary discharge planning process, led by the attending physician.  Recommendations may be updated based on patient status, additional functional criteria and insurance authorization.  Follow Up Recommendations Home health PT      Assistance Recommended at Discharge Intermittent Supervision/Assistance  Patient can return home with the following  Assist for transportation;Assistance with cooking/housework;Help with stairs or ramp for entrance    Equipment Recommendations None recommended by PT (pt refused DME)  Recommendations for Other Services       Functional Status Assessment Patient has had a recent decline in their functional status and demonstrates the ability to make significant improvements in function in a reasonable and predictable amount of time.     Precautions /  Restrictions Precautions Precautions: Fall Restrictions Weight Bearing Restrictions: No      Mobility  Bed Mobility Overal bed mobility: Modified Independent             General bed mobility comments: used rail    Transfers Overall transfer level: Needs assistance Equipment used: None               General transfer comment: Supervision - assistance for IV pole.    Ambulation/Gait Ambulation/Gait assistance: Supervision Gait Distance (Feet): 24 Feet Assistive device: None Gait Pattern/deviations: Step-through pattern, Decreased stride length Gait velocity: decr     General Gait Details: pt ambulated to bathroom then to recliner, declined longer distance due to feeling "lethargic", pt denied pain with walking, mildly unsteady but no overt loss of balance, walked with R CAM walker boot, pt declined use of RW  Stairs            Wheelchair Mobility    Modified Rankin (Stroke Patients Only)       Balance Overall balance assessment: Mild deficits observed, not formally tested, History of Falls                                           Pertinent Vitals/Pain Pain Assessment Pain Assessment: No/denies pain    Home Living Family/patient expects to be discharged to:: Private residence Living Arrangements: Spouse/significant other Available Help at Discharge: Family;Available PRN/intermittently Type of Home: House Home Access: Stairs to enter   Entrance Stairs-Number of Steps: 2   Home Layout: One level Home Equipment: None Additional Comments: lives with fiance'  Prior Function Prior Level of Function : Independent/Modified Independent             Mobility Comments: no device ADLs Comments: independent     Hand Dominance        Extremity/Trunk Assessment   Upper Extremity Assessment Upper Extremity Assessment: Defer to OT evaluation    Lower Extremity Assessment Lower Extremity Assessment: Overall WFL for  tasks assessed;RLE deficits/detail RLE Deficits / Details: edema noted in foot, applied R CAM walker boot RLE Sensation: WNL RLE Coordination: WNL    Cervical / Trunk Assessment Cervical / Trunk Assessment: Normal  Communication   Communication: No difficulties  Cognition Arousal/Alertness: Awake/alert Behavior During Therapy: WFL for tasks assessed/performed Overall Cognitive Status: Within Functional Limits for tasks assessed                                          General Comments      Exercises     Assessment/Plan    PT Assessment Patient needs continued PT services  PT Problem List Decreased mobility;Decreased activity tolerance;Decreased balance       PT Treatment Interventions Therapeutic activities;Therapeutic exercise;Gait training;Functional mobility training;Patient/family education    PT Goals (Current goals can be found in the Care Plan section)  Acute Rehab PT Goals Patient Stated Goal: to go home PT Goal Formulation: With patient Time For Goal Achievement: 09/01/21 Potential to Achieve Goals: Good    Frequency Min 3X/week     Co-evaluation               AM-PAC PT "6 Clicks" Mobility  Outcome Measure Help needed turning from your back to your side while in a flat bed without using bedrails?: None Help needed moving from lying on your back to sitting on the side of a flat bed without using bedrails?: None Help needed moving to and from a bed to a chair (including a wheelchair)?: A Little Help needed standing up from a chair using your arms (e.g., wheelchair or bedside chair)?: A Little Help needed to walk in hospital room?: A Little Help needed climbing 3-5 steps with a railing? : A Little 6 Click Score: 20    End of Session Equipment Utilized During Treatment: Gait belt Activity Tolerance: Patient limited by lethargy Patient left: in chair;with chair alarm set;with call bell/phone within reach Nurse Communication:  Mobility status PT Visit Diagnosis: Difficulty in walking, not elsewhere classified (R26.2)    Time: 1424-1440 PT Time Calculation (min) (ACUTE ONLY): 16 min   Charges:   PT Evaluation $PT Eval Moderate Complexity: 1 Mod          Tamala Ser PT 08/18/2021  Acute Rehabilitation Services  Office [phone]

## 2021-08-18 NOTE — Progress Notes (Signed)
PROGRESS NOTE    Renee [name]  TFT:[id number] DOB: 1975/08/15 DOA: 08/16/2021 PCP: Pcp, No   Brief Narrative:  46 year old female with history of alcohol abuse, ADD, hypertension, chronic pain syndrome, prior bariatric surgery presented with recurrent recent falls, recent alcohol binge, melena.  On presentation, hemoglobin was 6.4.  She was transfused 2 units packed red cells.  She was started on IV Protonix.  GI was consulted.  She underwent EGD on 08/17/2021.  Assessment & Plan:   Symptomatic anemia/acute blood loss anemia/upper GI bleeding/nonbleeding gastric and duodenal ulcer/gastritis -Presented with hemoglobin of 6.4: Status post 2 units packed red cells transfusion -Normal vitamin B12 level -EGD on 08/17/2021 showed nonbleeding gastric and duodenal ulcer as well as gastritis: Biopsies taken -Still on Protonix drip.  Currently on clear liquid diet.  Will advance to full liquid diet.  Follow further GI recommendations. -Hemoglobin 8 on 7/23.  Repeat a.m. hemoglobin.  No further hematemesis or melena during the hospitalization.  Alcoholic hepatitis -Improving.  Ultrasound of abdomen did not show any liver pathology.  Viral hepatitis panel negative.  Gallstone/possible sludge with?  CBD stone -Ultrasound of abdomen showed gallstones but no cholecystitis.  There is some concern of possible CBD sludge as there is some dilatation of the CBD has been.  GI not concerned: We will defer to GI for further work-up.  May need general surgery evaluation as an outpatient.  E. coli bacteremia -Questionable cause.  Follow cultures and sensitivity.  Continue Rocephin.  Frequent falls -PT/OT consulted  Alcohol abuse -Last drink was 7 days ago.  She states that she was sober for many years until 2021 when she started drinking again but she drinks only twice a month and drinks only very little. -Continue CIWA protocol.  No withdrawal symptoms for  now.  Hypokalemia Resolved  Hypomagnesemia -Repeat a.m. labs.  Thrombocytopenia -Possibly from alcohol abuse -Repeat a.m. labs  Leukocytosis -Repeat a.m. labs.  Fracture of left fifth metatarsal -EDP placed patient in cam boot.  Outpatient follow-up with orthopedics  Chronic pain syndrome -Baclofen on hold due to hypotension.  ADD -Adderall on hold  Hypothyroidism -Continue Synthroid  DVT prophylaxis: SCDs Code Status: Full Family Communication: None at bedside Disposition Plan: Status is: Inpatient Remains inpatient appropriate because: Of severity of illness.  Consultants: GI  Procedures: EGD on 08/17/2021  Antimicrobials: Rocephin from 08/17/2021 onwards   Subjective: Patient seen and examined at bedside.  Wants to try solid food.  Denies any worsening abdominal pain or vomiting.  No overnight fever or chest pain reported.  Objective: Vitals:   08/17/21 1315 08/17/21 2131 08/18/21 0504 08/18/21 0539  BP: (!) 88/67 91/70 102/69 94/65  Pulse: 64 66 68 70  Resp: 18 16 18 16   Temp: (!) 97.5 F (36.4 C) 99 F (37.2 C)  98.8 F (37.1 C)  TempSrc: Oral Oral  Oral  SpO2: 98% 100% 99% 97%  Weight:      Height:        Intake/Output Summary (Last 24 hours) at 08/18/2021 1059 Last data filed at 08/18/2021 0650 Gross per 24 hour  Intake 2285.13 ml  Output --  Net 2285.13 ml   Filed Weights   08/16/21 0145 08/17/21 1039  Weight: 77.1 kg 77.1 kg    Examination:  General exam: Appears calm and comfortable.  Looks chronically ill and deconditioned. Respiratory system: Bilateral decreased breath sounds at bases Cardiovascular system: S1 & S2 heard, Rate controlled Gastrointestinal system: Abdomen is nondistended, soft and nontender. Normal bowel sounds heard. Extremities:  No cyanosis, clubbing, edema  Central nervous system: Alert and oriented. No focal neurological deficits. Moving extremities Skin: No rashes, lesions or ulcers Psychiatry: Currently not  agitated.  Affect is flat.  Data Reviewed: I have personally reviewed following labs and imaging studies  CBC: Recent Labs  Lab 08/16/21 0226 08/16/21 1200 08/17/21 0542  WBC 7.4  --  11.0*  NEUTROABS 6.2  --   --   HGB 6.4* 8.7* 8.0*  HCT 19.1* 24.9* 23.1*  MCV 103.2*  --  95.1  PLT 140*  --  83*   Basic Metabolic Panel: Recent Labs  Lab 08/16/21 0226 08/16/21 0538 08/16/21 1200 08/17/21 0542 08/18/21 0424  NA 139  --  139 139 144  K 2.7*  --  2.8* 2.9* 3.9  CL 110  --  114* 115* 121*  CO2 22  --  21* 21* 19*  GLUCOSE 68*  --  67* 69* 96  BUN 12  --  10 8 5*  CREATININE 0.69  --  0.63 0.56 0.54  CALCIUM 8.3*  --  7.3* 7.4* 7.5*  MG  --  1.6*  --  1.6*  --    GFR: Estimated Creatinine Clearance: 96 mL/min (by C-G formula based on SCr of 0.54 mg/dL). Liver Function Tests: Recent Labs  Lab 08/16/21 0300 08/16/21 1200 08/18/21 0424  AST 1,269* 586* 88*  ALT 372* 276* 141*  ALKPHOS 220* 185* 151*  BILITOT 1.2 1.9* 0.6  PROT 4.8* 4.5* 4.6*  ALBUMIN 2.7* 2.4* 2.2*   No results for input(s): "LIPASE", "AMYLASE" in the last 168 hours. No results for input(s): "AMMONIA" in the last 168 hours. Coagulation Profile: Recent Labs  Lab 08/16/21 0423  INR 1.3*   Cardiac Enzymes: Recent Labs  Lab 08/16/21 0538  CKTOTAL 384*   BNP (last 3 results) No results for input(s): "PROBNP" in the last 8760 hours. HbA1C: No results for input(s): "HGBA1C" in the last 72 hours. CBG: Recent Labs  Lab 08/16/21 1549 08/17/21 0724 08/17/21 0837  GLUCAP 81 61* 98   Lipid Profile: No results for input(s): "CHOL", "HDL", "LDLCALC", "TRIG", "CHOLHDL", "LDLDIRECT" in the last 72 hours. Thyroid Function Tests: Recent Labs    08/16/21 0538  TSH 66.996*   Anemia Panel: Recent Labs    08/16/21 0538  VITAMINB12 1,178*  FOLATE 9.5  FERRITIN 426*  TIBC 174*  IRON 13*   Sepsis Labs: Recent Labs  Lab 08/16/21 0226 08/16/21 0436  LATICACIDVEN 1.4 1.3    Recent  Results (from the past 240 hour(s))  SARS Coronavirus 2 by RT PCR (hospital order, performed in [facility] hospital lab) *cepheid single result test* Anterior Nasal Swab     Status: None   Collection Time: 08/16/21  2:12 AM   Specimen: Anterior Nasal Swab  Result Value Ref Range Status   SARS Coronavirus 2 by RT PCR NEGATIVE NEGATIVE Final    Comment: (NOTE) SARS-CoV-2 target nucleic acids are NOT DETECTED.  The SARS-CoV-2 RNA is generally detectable in upper and lower respiratory specimens during the acute phase of infection. The lowest concentration of SARS-CoV-2 viral copies this assay can detect is 250 copies / mL. A negative result does not preclude SARS-CoV-2 infection and should not be used as the sole basis for treatment or other patient management decisions.  A negative result may occur with improper specimen collection / handling, submission of specimen other than nasopharyngeal swab, presence of viral mutation(s) within the areas targeted by this assay, and inadequate number of viral  copies (<250 copies / mL). A negative result must be combined with clinical observations, patient history, and epidemiological information.  Fact Sheet for Patients:   RoadLapTop.co.za  Fact Sheet for Healthcare Providers: http://kim-miller.com/  This test is not yet approved or  cleared by the [place] FDA and has been authorized for detection and/or diagnosis of SARS-CoV-2 by FDA under an Emergency Use Authorization (EUA).  This EUA will remain in effect (meaning this test can be used) for the duration of the COVID-19 declaration under Section 564(b)(1) of the Act, 21 U.S.C. section 360bbb-3(b)(1), unless the authorization is terminated or revoked sooner.  Performed at [facility], [address], [place], [place] [address]   Blood culture (routine x 2)     Status: Abnormal (Preliminary result)   Collection Time:  08/16/21  2:24 AM   Specimen: BLOOD  Result Value Ref Range Status   Specimen Description   Final    BLOOD LEFT ANTECUBITAL Performed at [facility], [address], [place], [place] [address]    Special Requests   Final    BOTTLES DRAWN AEROBIC AND ANAEROBIC Blood Culture results may not be optimal due to an excessive volume of blood received in culture bottles Performed at [facility], [address], [place], [place] [address]    Culture  Setup Time   Final    GRAM NEGATIVE RODS AEROBIC BOTTLE ONLY CRITICAL RESULT CALLED TO, READ BACK BY AND VERIFIED WITH: PHARMD A. PHAM [id number] @1427FH     Culture (A)  Final    ESCHERICHIA COLI SUSCEPTIBILITIES TO FOLLOW Performed at [facility], [address] [place], [place]    Report Status PENDING  Incomplete  Blood Culture ID Panel (Reflexed)     Status: Abnormal   Collection Time: 08/16/21  2:24 AM  Result Value Ref Range Status   Enterococcus faecalis NOT DETECTED NOT DETECTED Final   Enterococcus Faecium NOT DETECTED NOT DETECTED Final   Listeria monocytogenes NOT DETECTED NOT DETECTED Final   Staphylococcus species NOT DETECTED NOT DETECTED Final   Staphylococcus aureus (BCID) NOT DETECTED NOT DETECTED Final   Staphylococcus epidermidis NOT DETECTED NOT DETECTED Final   Staphylococcus lugdunensis NOT DETECTED NOT DETECTED Final   Streptococcus species NOT DETECTED NOT DETECTED Final   Streptococcus agalactiae NOT DETECTED NOT DETECTED Final   Streptococcus pneumoniae NOT DETECTED NOT DETECTED Final   Streptococcus pyogenes NOT DETECTED NOT DETECTED Final   A.calcoaceticus-baumannii NOT DETECTED NOT DETECTED Final   Bacteroides fragilis NOT DETECTED NOT DETECTED Final   Enterobacterales DETECTED (A) NOT DETECTED Final    Comment: Enterobacterales represent a large order of gram negative bacteria, not a single organism. CRITICAL RESULT CALLED TO, READ BACK BY AND VERIFIED  WITH: PHARMD A. PHAM 08/18/21 @1427FH     Enterobacter cloacae complex NOT DETECTED NOT DETECTED Final   Escherichia coli DETECTED (A) NOT DETECTED Final    Comment: CRITICAL RESULT CALLED TO, READ BACK BY AND VERIFIED WITH: PHARMD A. PHAM [id number] @1427FH     Klebsiella aerogenes NOT DETECTED NOT DETECTED Final   Klebsiella oxytoca NOT DETECTED NOT DETECTED Final   Klebsiella pneumoniae NOT DETECTED NOT DETECTED Final   Proteus species NOT DETECTED NOT DETECTED Final   Salmonella species NOT DETECTED NOT DETECTED Final   Serratia marcescens NOT DETECTED NOT DETECTED Final   Haemophilus influenzae NOT DETECTED NOT DETECTED Final   Neisseria meningitidis NOT DETECTED NOT DETECTED Final   Pseudomonas aeruginosa NOT DETECTED NOT DETECTED  Final   Stenotrophomonas maltophilia NOT DETECTED NOT DETECTED Final   Candida albicans NOT DETECTED NOT DETECTED Final   Candida auris NOT DETECTED NOT DETECTED Final   Candida glabrata NOT DETECTED NOT DETECTED Final   Candida krusei NOT DETECTED NOT DETECTED Final   Candida parapsilosis NOT DETECTED NOT DETECTED Final   Candida tropicalis NOT DETECTED NOT DETECTED Final   Cryptococcus neoformans/gattii NOT DETECTED NOT DETECTED Final   CTX-M ESBL NOT DETECTED NOT DETECTED Final   Carbapenem resistance IMP NOT DETECTED NOT DETECTED Final   Carbapenem resistance KPC NOT DETECTED NOT DETECTED Final   Carbapenem resistance NDM NOT DETECTED NOT DETECTED Final   Carbapenem resist OXA 48 LIKE NOT DETECTED NOT DETECTED Final   Carbapenem resistance VIM NOT DETECTED NOT DETECTED Final    Comment: Performed at [facility], [address] [place], [place] [address]  Blood culture (routine x 2)     Status: None (Preliminary result)   Collection Time: 08/16/21  2:26 AM   Specimen: BLOOD  Result Value Ref Range Status   Specimen Description   Final    BLOOD RIGHT ANTECUBITAL Performed at [facility], [address], [place], [place]  [address]    Special Requests   Final    BOTTLES DRAWN AEROBIC AND ANAEROBIC Blood Culture adequate volume Performed at [facility], [address], [place], [place] [address]    Culture  Setup Time   Final    GRAM NEGATIVE RODS AEROBIC BOTTLE ONLY CRITICAL VALUE NOTED.  VALUE IS CONSISTENT WITH PREVIOUSLY REPORTED AND CALLED VALUE.    Culture   Final    NO GROWTH 2 DAYS Performed at [facility], [address], [place], [place] [address]    Report Status PENDING  Incomplete         Radiology Studies: No results found.      Scheduled Meds:  folic acid  1 mg Oral Daily   levothyroxine  200 mcg Oral Q0600   multivitamin with minerals  1 tablet Oral Daily   thiamine  100 mg Oral Daily   Or   thiamine  100 mg Intravenous Daily   Continuous Infusions:  cefTRIAXone (ROCEPHIN)  IV 2 g (08/17/21 1659)   pantoprazole 8 mg/hr (08/18/21 6659)          Glade Lloyd, MD [facility] 08/18/2021, 10:59 AM

## 2021-08-18 NOTE — Progress Notes (Signed)
[facility] Progress Note  Renee [name] 46 y.o. 1975/07/31  Subjective: Patient seen and examined laying in bed.  Patient denies abdominal pain.  Denies further episodes of melena.  Tolerating full liquid diet well.   ROS : Review of Systems  Gastrointestinal:  Negative for abdominal pain, blood in stool, constipation, diarrhea, heartburn, melena, nausea and vomiting.  Genitourinary:  Negative for dysuria and urgency.    Objective: Vital signs in last 24 hours: Vitals:   08/18/21 0504 08/18/21 0539  BP: 102/69 94/65  Pulse: 68 70  Resp: 18 16  Temp:  98.8 F (37.1 C)  SpO2: 99% 97%    Physical Exam:  General:  Alert, cooperative, no distress, appears stated age  Head:  Normocephalic, without obvious abnormality, atraumatic  Eyes:  Anicteric sclera, EOM's intact  Lungs:   Clear to auscultation bilaterally, respirations unlabored  Heart:  Regular rate and rhythm, S1, S2 normal  Abdomen:   Soft, non-tender, bowel sounds active all four quadrants,  no masses,   Extremities: Extremities normal, atraumatic, no  edema  Pulses: 2+ and symmetric    Lab Results: Recent Labs    08/16/21 0538 08/16/21 1200 08/17/21 0542 08/18/21 0424  NA  --    < > 139 144  K  --    < > 2.9* 3.9  CL  --    < > 115* 121*  CO2  --    < > 21* 19*  GLUCOSE  --    < > 69* 96  BUN  --    < > 8 5*  CREATININE  --    < > 0.56 0.54  CALCIUM  --    < > 7.4* 7.5*  MG 1.6*  --  1.6*  --    < > = values in this interval not displayed.   Recent Labs    08/16/21 1200 08/18/21 0424  AST 586* 88*  ALT 276* 141*  ALKPHOS 185* 151*  BILITOT 1.9* 0.6  PROT 4.5* 4.6*  ALBUMIN 2.4* 2.2*   Recent Labs    08/16/21 0226 08/16/21 1200 08/17/21 0542  WBC 7.4  --  11.0*  NEUTROABS 6.2  --   --   HGB 6.4* 8.7* 8.0*  HCT 19.1* 24.9* 23.1*  MCV 103.2*  --  95.1  PLT 140*  --  83*   Recent Labs    08/16/21 0423  LABPROT 15.8*  INR 1.3*      Assessment Melena Symptomatic anemia,  macrocytic Elevated LFTs  HGB 8.0(8.7) Platelets 83(140) AST 88(586) ALT 141(276)  Alkphos 151(185) TBili 0.6(1.9)     Hepatic panel negative.   2 units packed red blood cells given today.  Monitoring CBC.    EGD 7/25 - Normal esophagus. - Z-line regular, 42 cm from the incisors. - Non-bleeding gastric ulcer with no stigmata of bleeding. - Gastritis. Biopsied. - Non-bleeding duodenal ulcer with no stigmata of bleeding. 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  Awaiting H. Pylori serology  Elevated liver tests improving, likely elevated liver tests due to muscle injury from falls.    Plan: Finish Protonix 72-hour drip.  After that continue pantoprazole 40 mg twice daily. Can advance diet as tolerated Recommend complete avoidance of NSAIDs. [facility] will sign off.   Roseanne Reno Kierra Jezewski PA-C 08/18/2021, 11:45 AM  Contact #  [phone]

## 2021-08-19 ENCOUNTER — Other Ambulatory Visit (HOSPITAL_COMMUNITY): Payer: Self-pay

## 2021-08-19 ENCOUNTER — Inpatient Hospital Stay (HOSPITAL_COMMUNITY): Payer: BC Managed Care – PPO

## 2021-08-19 DIAGNOSIS — B9620 Unspecified Escherichia coli [E. coli] as the cause of diseases classified elsewhere: Secondary | ICD-10-CM

## 2021-08-19 DIAGNOSIS — F988 Other specified behavioral and emotional disorders with onset usually occurring in childhood and adolescence: Secondary | ICD-10-CM | POA: Diagnosis not present

## 2021-08-19 DIAGNOSIS — R7881 Bacteremia: Secondary | ICD-10-CM

## 2021-08-19 DIAGNOSIS — D649 Anemia, unspecified: Secondary | ICD-10-CM | POA: Diagnosis not present

## 2021-08-19 DIAGNOSIS — G894 Chronic pain syndrome: Secondary | ICD-10-CM | POA: Diagnosis not present

## 2021-08-19 LAB — COMPREHENSIVE METABOLIC PANEL
ALT: 111 U/L — ABNORMAL HIGH (ref 0–44)
AST: 68 U/L — ABNORMAL HIGH (ref 15–41)
Albumin: 2.2 g/dL — ABNORMAL LOW (ref 3.5–5.0)
Alkaline Phosphatase: 137 U/L — ABNORMAL HIGH (ref 38–126)
Anion gap: 3 — ABNORMAL LOW (ref 5–15)
BUN: <5 mg/dL — ABNORMAL LOW (ref 6–20)
CO2: 19 mmol/L — ABNORMAL LOW (ref 22–32)
Calcium: 7.6 mg/dL — ABNORMAL LOW (ref 8.9–10.3)
Chloride: 119 mmol/L — ABNORMAL HIGH (ref 98–111)
Creatinine, Ser: 0.52 mg/dL (ref 0.44–1.00)
GFR, Estimated: >60 mL/min (ref >60)
Glucose, Bld: 90 mg/dL (ref 70–99)
Potassium: 3.8 mmol/L (ref 3.5–5.1)
Sodium: 141 mmol/L (ref 135–145)
Total Bilirubin: 0.6 mg/dL (ref 0.3–1.2)
Total Protein: 4.5 g/dL — ABNORMAL LOW (ref 6.5–8.1)

## 2021-08-19 LAB — CBC WITH DIFFERENTIAL/PLATELET
Abs Immature Granulocytes: 0.1 10*3/uL — ABNORMAL HIGH (ref 0.00–0.07)
Basophils Absolute: 0.1 10*3/uL (ref 0.0–0.1)
Basophils Relative: 1 %
Eosinophils Absolute: 0 10*3/uL (ref 0.0–0.5)
Eosinophils Relative: 1 %
HCT: 23.5 % — ABNORMAL LOW (ref 36.0–46.0)
Hemoglobin: 7.7 g/dL — ABNORMAL LOW (ref 12.0–15.0)
Immature Granulocytes: 1 %
Lymphocytes Relative: 42 %
Lymphs Abs: 3.5 10*3/uL (ref 0.7–4.0)
MCH: 32.1 pg (ref 26.0–34.0)
MCHC: 32.8 g/dL (ref 30.0–36.0)
MCV: 97.9 fL (ref 80.0–100.0)
Monocytes Absolute: 0.3 10*3/uL (ref 0.1–1.0)
Monocytes Relative: 4 %
Neutro Abs: 4.3 10*3/uL (ref 1.7–7.7)
Neutrophils Relative %: 51 %
Platelets: 120 10*3/uL — ABNORMAL LOW (ref 150–400)
RBC: 2.4 MIL/uL — ABNORMAL LOW (ref 3.87–5.11)
RDW: 20.1 % — ABNORMAL HIGH (ref 11.5–15.5)
WBC: 8.4 10*3/uL (ref 4.0–10.5)
nRBC: 0 % (ref 0.0–0.2)

## 2021-08-19 LAB — MAGNESIUM: Magnesium: 1.7 mg/dL (ref 1.7–2.4)

## 2021-08-19 MED ORDER — PANTOPRAZOLE SODIUM 40 MG IV SOLR
40.0000 mg | Freq: Two times a day (BID) | INTRAVENOUS | Status: DC
  Administered 2021-08-19: 40 mg via INTRAVENOUS
  Filled 2021-08-19: qty 10

## 2021-08-19 MED ORDER — GADOBUTROL 1 MMOL/ML IV SOLN
7.5000 mL | Freq: Once | INTRAVENOUS | Status: AC | PRN
  Administered 2021-08-19: 7.5 mL via INTRAVENOUS

## 2021-08-19 MED ORDER — THIAMINE HCL 100 MG PO TABS
100.0000 mg | ORAL_TABLET | Freq: Every day | ORAL | 0 refills | Status: DC
  Filled 2021-08-19: qty 30, 30d supply, fill #0

## 2021-08-19 MED ORDER — CEPHALEXIN 500 MG PO CAPS
500.0000 mg | ORAL_CAPSULE | Freq: Four times a day (QID) | ORAL | 0 refills | Status: AC
  Filled 2021-08-19: qty 28, 7d supply, fill #0

## 2021-08-19 MED ORDER — FLUTICASONE PROPIONATE 50 MCG/ACT NA SUSP: 2.0000 | Freq: Every day | NASAL | Status: DC | PRN

## 2021-08-19 MED ORDER — OMEPRAZOLE 40 MG PO CPDR
40.0000 mg | DELAYED_RELEASE_CAPSULE | Freq: Two times a day (BID) | ORAL | 0 refills | Status: DC
  Filled 2021-08-19: qty 60, 30d supply, fill #0

## 2021-08-19 MED ORDER — FOLIC ACID 1 MG PO TABS
1.0000 mg | ORAL_TABLET | Freq: Every day | ORAL | 0 refills | Status: DC
  Filled 2021-08-19: qty 30, 30d supply, fill #0

## 2021-08-19 NOTE — Discharge Summary (Signed)
Physician Discharge Summary  Renee [name] IHK:[id number] DOB: 1975-06-18 DOA: 08/16/2021  PCP: Pcp, No  Admit date: 08/16/2021 Discharge date: 08/19/2021  Admitted From: Home Disposition: Home  Recommendations for Outpatient Follow-up:  Follow up with PCP in 1 week with repeat CBC/CMP Outpatient follow-up with GI and orthopedics Follow up in ED if symptoms worsen or new appear   Home Health: Home health PT  equipment/Devices: None  Discharge Condition: Stable CODE STATUS: Full Diet recommendation: Soft diet  Brief/Interim Summary: 46 year old female with history of alcohol abuse, ADD, hypertension, chronic pain syndrome, prior bariatric surgery presented with recurrent recent falls, recent alcohol binge, melena.  On presentation, hemoglobin was 6.4.  She was transfused 2 units packed red cells.  She was started on IV Protonix.  GI was consulted.  She underwent EGD on 08/17/2021.  Subsequently, hemoglobin has remained stable and she has tolerated advanced diet.  She was also found to have E. coli bacteremia and was put on Rocephin.  GI has cleared the patient for discharge.  She will be discharged home on oral Protonix and Keflex.  Outpatient follow-up with PCP/GI.  Discharge Diagnoses:   Symptomatic anemia/acute blood loss anemia/upper GI bleeding/nonbleeding gastric and duodenal ulcer/gastritis -Presented with hemoglobin of 6.4: Status post 2 units packed red cells transfusion -Normal vitamin B12 level -EGD on 08/17/2021 showed nonbleeding gastric and duodenal ulcer as well as gastritis: Biopsies taken -Treated with Protonix drip.  Diet has been gradually advanced and currently on soft diet -Hemoglobin 7.7 today.  Outpatient follow-up of hemoglobin.  No further hematemesis or melena during the hospitalization. -GI has cleared the patient for discharge on PPI twice daily for 3 months then once a day.  Outpatient follow-up with GI.  We will give 1 month supply of Prilosec.   Alcoholic  hepatitis -Improving.  Ultrasound of abdomen did not show any liver pathology.  Viral hepatitis panel negative. -Will need outpatient follow-up.  Abstain from alcohol.   Gallstone/possible sludge with?  CBD stone -Ultrasound of abdomen showed gallstones but no cholecystitis.  There is some concern of possible CBD sludge as there is some dilatation of the CBD has been.  -MRCP negative for CBD stones.  Will need outpatient general surgery evaluation and follow-up   E. coli bacteremia -Questionable cause.  Currently on Rocephin.  Currently afebrile and hemodynamically stable.  Discharge home on oral Keflex for 7 days.   Frequent falls -PT recommends home and PT.  Alcohol abuse -Last drink was 7 days ago.  She states that she was sober for many years until 2021 when she started drinking again but she drinks only twice a month and drinks only very little. -Treated with CIWA protocol.  Continue folic acid and thiamine on discharge   Hypokalemia -Resolved  Hypomagnesemia -Improved  Thrombocytopenia -Possibly from alcohol abuse -Outpatient follow-up  Leukocytosis -Resolved.   Fracture of left fifth metatarsal -EDP placed patient in cam boot.  Will need outpatient follow-up with orthopedics   Chronic pain syndrome -Baclofen on hold due to hypotension.   ADD -Adderall on hold.  Follow-up with PCP regarding the same   Hypothyroidism -Continue Synthroid  Discharge Instructions  Discharge Instructions     Diet general   Complete by: As directed    Increase activity slowly   Complete by: As directed       Allergies as of 08/19/2021       Reactions   Morphine And Related Hives, Rash   Penicillins Hives, Rash   Diphenhydramine    Bendrayl-"Has  opposite effect"    Latex Dermatitis   Paper tape ok   Synthroid [levothyroxine]    Name brand causes hair loss   Allopurinol Hives, Rash        Medication List     STOP taking these medications     amphetamine-dextroamphetamine 30 MG tablet Commonly known as: Adderall   baclofen 10 MG tablet Commonly known as: LIORESAL       TAKE these medications    cephALEXin 500 MG capsule Commonly known as: KEFLEX Take 1 capsule (500 mg total) by mouth 4 (four) times daily for 7 days.   fluticasone 50 MCG/ACT nasal spray Commonly known as: FLONASE Place 2 sprays into both nostrils daily as needed for allergies.   folic acid 1 MG tablet Commonly known as: FOLVITE Take 1 tablet (1 mg total) by mouth daily. Start taking on: August 20, 2021   levothyroxine 200 MCG tablet Commonly known as: SYNTHROID TAKE 1 TABLET(200 MCG) BY MOUTH DAILY BEFORE BREAKFAST What changed: See the new instructions.   montelukast 10 MG tablet Commonly known as: SINGULAIR Take 1 tablet (10 mg total) by mouth at bedtime. What changed:  when to take this reasons to take this   Multi-Vitamin tablet Take by mouth.   omeprazole 40 MG capsule Commonly known as: PRILOSEC Take 1 capsule (40 mg total) by mouth 2 (two) times daily before a meal.   ondansetron 8 MG disintegrating tablet Commonly known as: ZOFRAN-ODT Take 1 tablet (8 mg total) by mouth 3 (three) times daily as needed.   thiamine 100 MG tablet Commonly known as: VITAMIN B1 Take 1 tablet (100 mg total) by mouth daily. Start taking on: August 20, 2021        Follow-up Information     PCP. Schedule an appointment as soon as possible for a visit in 1 week(s).   Why: with repeat CBC/CMP        Charlott Rakes, MD. Schedule an appointment as soon as possible for a visit in 2 week(s).   Specialty: Gastroenterology Contact information: [address] [place] [address] [phone]                Allergies  Allergen Reactions   Morphine And Related Hives and Rash   Penicillins Hives and Rash   Diphenhydramine     Bendrayl-"Has opposite effect"    Latex Dermatitis    Paper tape ok    Synthroid  [Levothyroxine]     Name brand causes hair loss   Allopurinol Hives and Rash    Consultations: GI   Procedures/Studies: MR ABDOMEN MRCP W WO CONTAST  Result Date: 08/19/2021 CLINICAL DATA:  Jaundice, elevated LFTs, bile duct dilatation EXAM: MRI ABDOMEN WITHOUT AND WITH CONTRAST (INCLUDING MRCP) TECHNIQUE: Multiplanar multisequence MR imaging of the abdomen was performed both before and after the administration of intravenous contrast. Heavily T2-weighted images of the biliary and pancreatic ducts were obtained, and three-dimensional MRCP images were rendered by post processing. CONTRAST:  7.49mL GADAVIST GADOBUTROL 1 MMOL/ML IV SOLN COMPARISON:  Right upper quadrant ultrasound dated 08/16/2021 FINDINGS: Markedly limited evaluation due to motion degradation and artifact. Lower chest: Mild left basilar opacity, likely atelectasis, with suspected small bilateral pleural effusions. Hepatobiliary: Liver is grossly unremarkable. No intrahepatic or extrahepatic ductal dilatation. No gallstones or gallbladder distension. Mild gallbladder wall edema/pericholecystic fluid. Markedly limited MRCP. However, there is no intrahepatic or extrahepatic ductal dilatation. No choledocholithiasis is seen. Pancreas:  Grossly unremarkable. Spleen:  Grossly unremarkable. Adrenals/Urinary Tract:  Adrenal glands are within normal limits. Kidneys are grossly unremarkable.  No hydronephrosis. Stomach/Bowel: Stomach is within normal limits. Visualized bowel is grossly unremarkable. Vascular/Lymphatic:  No evidence of abdominal aortic aneurysm. No suspicious abdominal lymphadenopathy. Other:  Small volume abdominal ascites. Musculoskeletal: No focal osseous lesions. IMPRESSION: Markedly limited evaluation due to motion degradation and artifact. No intrahepatic or extrahepatic ductal dilatation. No choledocholithiasis is seen. Mild gallbladder wall edema/pericholecystic fluid. No gallstones or gallbladder distension. This appearance  is favored to reflect a right upper quadrant inflammatory process such as hepatitis. Small volume abdominal ascites. Electronically Signed   By: Charline Bills M.D.   On: 08/19/2021 01:40   MR 3D Recon At Scanner  Result Date: 08/19/2021 CLINICAL DATA:  Jaundice, elevated LFTs, bile duct dilatation EXAM: MRI ABDOMEN WITHOUT AND WITH CONTRAST (INCLUDING MRCP) TECHNIQUE: Multiplanar multisequence MR imaging of the abdomen was performed both before and after the administration of intravenous contrast. Heavily T2-weighted images of the biliary and pancreatic ducts were obtained, and three-dimensional MRCP images were rendered by post processing. CONTRAST:  7.50mL GADAVIST GADOBUTROL 1 MMOL/ML IV SOLN COMPARISON:  Right upper quadrant ultrasound dated 08/16/2021 FINDINGS: Markedly limited evaluation due to motion degradation and artifact. Lower chest: Mild left basilar opacity, likely atelectasis, with suspected small bilateral pleural effusions. Hepatobiliary: Liver is grossly unremarkable. No intrahepatic or extrahepatic ductal dilatation. No gallstones or gallbladder distension. Mild gallbladder wall edema/pericholecystic fluid. Markedly limited MRCP. However, there is no intrahepatic or extrahepatic ductal dilatation. No choledocholithiasis is seen. Pancreas:  Grossly unremarkable. Spleen:  Grossly unremarkable. Adrenals/Urinary Tract:  Adrenal glands are within normal limits. Kidneys are grossly unremarkable.  No hydronephrosis. Stomach/Bowel: Stomach is within normal limits. Visualized bowel is grossly unremarkable. Vascular/Lymphatic:  No evidence of abdominal aortic aneurysm. No suspicious abdominal lymphadenopathy. Other:  Small volume abdominal ascites. Musculoskeletal: No focal osseous lesions. IMPRESSION: Markedly limited evaluation due to motion degradation and artifact. No intrahepatic or extrahepatic ductal dilatation. No choledocholithiasis is seen. Mild gallbladder wall edema/pericholecystic fluid.  No gallstones or gallbladder distension. This appearance is favored to reflect a right upper quadrant inflammatory process such as hepatitis. Small volume abdominal ascites. Electronically Signed   By: Charline Bills M.D.   On: 08/19/2021 01:40   DG CHEST PORT 1 VIEW  Result Date: 08/16/2021 CLINICAL DATA:  46 year old female with history of fever. EXAM: PORTABLE CHEST 1 VIEW COMPARISON:  Chest x-ray 02/10/2021. FINDINGS: Lung volumes are low. No consolidative airspace disease. No pleural effusions. No pneumothorax. No evidence of pulmonary edema. Heart size is mildly enlarged. Upper mediastinal contours are within normal limits. IMPRESSION: 1. No radiographic evidence of acute cardiopulmonary disease. 2. Cardiomegaly. Electronically Signed   By: Trudie Reed M.D.   On: 08/16/2021 06:41   US Abdomen Limited RUQ (LIVER/GB)  Result Date: 08/16/2021 CLINICAL DATA:  46 year old female with history of elevated liver enzymes. EXAM: ULTRASOUND ABDOMEN LIMITED RIGHT UPPER QUADRANT COMPARISON:  None Available. FINDINGS: Gallbladder: Gallbladder is moderately distended. Gallbladder wall thickness is mildly increased at 4.6 mm. No shadowing gallstones. However, there is a large amount of echogenic nonshadowing material lying dependently in the gallbladder, indicative of a large volume of biliary sludge. No pericholecystic fluid. No sonographic Murphy sign noted by sonographer. Common bile duct: Diameter: 7.7 mm Liver: No focal lesion identified. Diffusely increased hepatic parenchymal echogenicity. Portal vein is patent on color Doppler imaging with normal direction of blood flow towards the liver. Other: None. IMPRESSION: 1. Large volume of biliary sludge in the gallbladder. Gallbladder wall appears mildly thickened. Gallbladder  is moderately distended. However, there is no pericholecystic fluid and no sonographic Murphy's sign on examination. Notably, however, there is also common bile duct dilatation (7.7  mm). If there is clinical concern for common bile duct obstruction from biliary sludge or other source, further evaluation with abdominal MRI with and without IV gadolinium with MRCP would be suggested. 2. Hepatic steatosis. Electronically Signed   By: Daniel  Entrikin MTrudie Reed24/2023 06:12   DG Foot Complete Right  Result Date: 08/16/2021 CLINICAL DATA:  History of multiple recent falls with pain. EXAM: RIGHT FOOT COMPLETE - 3+ VIEW COMPARISON:  None Available. FINDINGS: There is an oblique fracture of the mid to distal fifth metatarsal. The remaining bony structures are intact. Calcaneal spurring is noted. Soft tissues are unremarkable. IMPRESSION: Oblique fracture of the mid to distal fifth metatarsal. Electronically Signed   By: Thornell Sartorius M.D.   On: 08/16/2021 04:21   DG Foot Complete Left  Result Date: 08/16/2021 CLINICAL DATA:  History of multiple recent falls with pain. EXAM: LEFT FOOT - COMPLETE 3+ VIEW COMPARISON:  None Available. FINDINGS: There is no evidence of fracture or dislocation. There is no evidence of arthropathy or other focal bone abnormality. Calcaneal spurring is noted. Soft tissues are unremarkable. IMPRESSION: No acute fracture or dislocation. Electronically Signed   By: Thornell Sartorius M.D.   On: 08/16/2021 04:20   DG Tibia/Fibula Right  Result Date: 08/16/2021 CLINICAL DATA:  History of multiple recent falls with pain. EXAM: RIGHT TIBIA AND FIBULA - 2 VIEW COMPARISON:  None Available. FINDINGS: There is no evidence of fracture or other focal bone lesions. Calcaneal spurring is noted. Soft tissues are unremarkable. IMPRESSION: No acute fracture or dislocation. Electronically Signed   By: Thornell Sartorius M.D.   On: 08/16/2021 04:18   CT Head Wo Contrast  Result Date: 08/16/2021 CLINICAL DATA:  Status post trauma. EXAM: CT HEAD WITHOUT CONTRAST TECHNIQUE: Contiguous axial images were obtained from the base of the skull through the vertex without intravenous contrast.  RADIATION DOSE REDUCTION: This exam was performed according to the departmental dose-optimization program which includes automated exposure control, adjustment of the mA and/or kV according to patient size and/or use of iterative reconstruction technique. COMPARISON:  September 12, 2008 FINDINGS: Brain: No evidence of acute infarction, hemorrhage, hydrocephalus, extra-axial collection or mass lesion/mass effect. Vascular: No hyperdense vessel or unexpected calcification. Skull: Normal. Negative for fracture or focal lesion. Sinuses/Orbits: No acute finding. Other: None. IMPRESSION: No acute intracranial abnormality. Electronically Signed   By: Aram Candela M.D.   On: 08/16/2021 04:18   DG Tibia/Fibula Left  Result Date: 08/16/2021 CLINICAL DATA:  History of multiple recent falls with pain. EXAM: LEFT TIBIA AND FIBULA - 2 VIEW COMPARISON:  None Available. FINDINGS: There is no evidence of fracture or other focal bone lesions. Calcaneal spurring is noted. There are enthesopathic changes at the insertion site of the Achilles tendon. Soft tissues are unremarkable. IMPRESSION: No acute fracture or dislocation. Electronically Signed   By: Thornell Sartorius M.D.   On: 08/16/2021 04:17   DG Sacrum/Coccyx  Result Date: 08/16/2021 CLINICAL DATA:  History of multiple falls with pain. EXAM: SACRUM AND COCCYX - 2+ VIEW COMPARISON:  09/12/2008. FINDINGS: There is no evidence of acute fracture. There is mild ventral displacement of the distal tip of the coccyx which is unchanged from 2010. Mild degenerative changes are present in the lower lumbar spine. IMPRESSION: No acute fracture. Electronically Signed   By: Charlestine Night.D.  On: 08/16/2021 04:16      Subjective: Patient seen and examined at bedside.  Poor historian.  Denies worsening abdominal pain, fever or vomiting.  Feels okay to go home today.  Discharge Exam: Vitals:   08/18/21 2209 08/19/21 0435  BP: (!) 97/54 95/62  Pulse: 73 71  Resp: 18 18  Temp:  97.8 F (36.6 C) 98.4 F (36.9 C)  SpO2: 97% 100%    General: Pt is alert, awake, not in acute distress.  Currently on room air.  Slow to respond.  Poor historian.  Flat affect. Cardiovascular: rate controlled, S1/S2 + Respiratory: bilateral decreased breath sounds at bases Abdominal: Soft, NT, ND, bowel sounds + Extremities: no edema, no cyanosis    The results of significant diagnostics from this hospitalization (including imaging, microbiology, ancillary and laboratory) are listed below for reference.     Microbiology: Recent Results (from the past 240 hour(s))  SARS Coronavirus 2 by RT PCR (hospital order, performed in [facility] hospital lab) *cepheid single result test* Anterior Nasal Swab     Status: None   Collection Time: 08/16/21  2:12 AM   Specimen: Anterior Nasal Swab  Result Value Ref Range Status   SARS Coronavirus 2 by RT PCR NEGATIVE NEGATIVE Final    Comment: (NOTE) SARS-CoV-2 target nucleic acids are NOT DETECTED.  The SARS-CoV-2 RNA is generally detectable in upper and lower respiratory specimens during the acute phase of infection. The lowest concentration of SARS-CoV-2 viral copies this assay can detect is 250 copies / mL. A negative result does not preclude SARS-CoV-2 infection and should not be used as the sole basis for treatment or other patient management decisions.  A negative result may occur with improper specimen collection / handling, submission of specimen other than nasopharyngeal swab, presence of viral mutation(s) within the areas targeted by this assay, and inadequate number of viral copies (<250 copies / mL). A negative result must be combined with clinical observations, patient history, and epidemiological information.  Fact Sheet for Patients:   RoadLapTop.co.za  Fact Sheet for Healthcare Providers: http://kim-miller.com/  This test is not yet approved or  cleared by the [place] FDA  and has been authorized for detection and/or diagnosis of SARS-CoV-2 by FDA under an Emergency Use Authorization (EUA).  This EUA will remain in effect (meaning this test can be used) for the duration of the COVID-19 declaration under Section 564(b)(1) of the Act, 21 U.S.C. section 360bbb-3(b)(1), unless the authorization is terminated or revoked sooner.  Performed at [facility], [address], [place], [place] [address]   Blood culture (routine x 2)     Status: Abnormal (Preliminary result)   Collection Time: 08/16/21  2:24 AM   Specimen: BLOOD  Result Value Ref Range Status   Specimen Description   Final    BLOOD LEFT ANTECUBITAL Performed at [facility], [address] [place], [place] [address]    Special Requests   Final    BOTTLES DRAWN AEROBIC AND ANAEROBIC Blood Culture results may not be optimal due to an excessive volume of blood received in culture bottles Performed at [facility], [address], [place], [place] [address]    Culture  Setup Time   Final    GRAM NEGATIVE RODS IN BOTH AEROBIC AND ANAEROBIC BOTTLES CRITICAL RESULT CALLED TO, READ BACK BY AND VERIFIED WITH: PHARMD A. [facility] [id number]  Performed at [facility], [address], [place], [place] [address]  Culture ESCHERICHIA COLI (A)  Final   Report Status PENDING  Incomplete   Organism ID, Bacteria ESCHERICHIA COLI  Final      Susceptibility   Escherichia coli - MIC*    AMPICILLIN <=2 SENSITIVE Sensitive     CEFAZOLIN <=4 SENSITIVE Sensitive     CEFEPIME <=0.12 SENSITIVE Sensitive     CEFTAZIDIME <=1 SENSITIVE Sensitive     CEFTRIAXONE <=0.25 SENSITIVE Sensitive     CIPROFLOXACIN <=0.25 SENSITIVE Sensitive     GENTAMICIN <=1 SENSITIVE Sensitive     IMIPENEM <=0.25 SENSITIVE Sensitive     TRIMETH/SULFA <=20 SENSITIVE Sensitive     AMPICILLIN/SULBACTAM <=2 SENSITIVE Sensitive     PIP/TAZO <=4 SENSITIVE Sensitive     *  ESCHERICHIA COLI  Blood Culture ID Panel (Reflexed)     Status: Abnormal   Collection Time: 08/16/21  2:24 AM  Result Value Ref Range Status   Enterococcus faecalis NOT DETECTED NOT DETECTED Final   Enterococcus Faecium NOT DETECTED NOT DETECTED Final   Listeria monocytogenes NOT DETECTED NOT DETECTED Final   Staphylococcus species NOT DETECTED NOT DETECTED Final   Staphylococcus aureus (BCID) NOT DETECTED NOT DETECTED Final   Staphylococcus epidermidis NOT DETECTED NOT DETECTED Final   Staphylococcus lugdunensis NOT DETECTED NOT DETECTED Final   Streptococcus species NOT DETECTED NOT DETECTED Final   Streptococcus agalactiae NOT DETECTED NOT DETECTED Final   Streptococcus pneumoniae NOT DETECTED NOT DETECTED Final   Streptococcus pyogenes NOT DETECTED NOT DETECTED Final   A.calcoaceticus-baumannii NOT DETECTED NOT DETECTED Final   Bacteroides fragilis NOT DETECTED NOT DETECTED Final   Enterobacterales DETECTED (A) NOT DETECTED Final    Comment: Enterobacterales represent a large order of gram negative bacteria, not a single organism. CRITICAL RESULT CALLED TO, READ BACK BY AND VERIFIED WITH: PHARMD A. [facility] [id number]     Enterobacter cloacae complex NOT DETECTED NOT DETECTED Final   Escherichia coli DETECTED (A) NOT DETECTED Final    Comment: CRITICAL RESULT CALLED TO, READ BACK BY AND VERIFIED WITH: PHARMD A. PHAM [id number]     Klebsiella aerogenes NOT DETECTED NOT DETECTED Final   Klebsiella oxytoca NOT DETECTED NOT DETECTED Final   Klebsiella pneumoniae NOT DETECTED NOT DETECTED Final   Proteus species NOT DETECTED NOT DETECTED Final   Salmonella species NOT DETECTED NOT DETECTED Final   Serratia marcescens NOT DETECTED NOT DETECTED Final   Haemophilus influenzae NOT DETECTED NOT DETECTED Final   Neisseria meningitidis NOT DETECTED NOT DETECTED Final   Pseudomonas aeruginosa NOT DETECTED NOT DETECTED Final   Stenotrophomonas maltophilia NOT DETECTED NOT DETECTED Final    Candida albicans NOT DETECTED NOT DETECTED Final   Candida auris NOT DETECTED NOT DETECTED Final   Candida glabrata NOT DETECTED NOT DETECTED Final   Candida krusei NOT DETECTED NOT DETECTED Final   Candida parapsilosis NOT DETECTED NOT DETECTED Final   Candida tropicalis NOT DETECTED NOT DETECTED Final   Cryptococcus neoformans/gattii NOT DETECTED NOT DETECTED Final   CTX-M ESBL NOT DETECTED NOT DETECTED Final   Carbapenem resistance IMP NOT DETECTED NOT DETECTED Final   Carbapenem resistance KPC NOT DETECTED NOT DETECTED Final   Carbapenem resistance NDM NOT DETECTED NOT DETECTED Final   Carbapenem resist OXA 48 LIKE NOT DETECTED NOT DETECTED Final   Carbapenem resistance VIM NOT DETECTED NOT DETECTED Final    Comment: Performed at [facility], [address] [place], [place] [address]  Blood culture (routine x 2)     Status: None (Preliminary result)  Collection Time: 08/16/21  2:26 AM   Specimen: BLOOD  Result Value Ref Range Status   Specimen Description   Final    BLOOD RIGHT ANTECUBITAL Performed at [facility], [address] [place], [place] [address]    Special Requests   Final    BOTTLES DRAWN AEROBIC AND ANAEROBIC Blood Culture adequate volume Performed at [facility], [address] [place], [place] [address]    Culture  Setup Time   Final    GRAM NEGATIVE RODS AEROBIC BOTTLE ONLY CRITICAL VALUE NOTED.  VALUE IS CONSISTENT WITH PREVIOUSLY REPORTED AND CALLED VALUE. Performed at [facility], [address] [place], [place] [address]    Culture GRAM NEGATIVE RODS  Final   Report Status PENDING  Incomplete     Labs: BNP (last 3 results) No results for input(s): "BNP" in the last 8760 hours. Basic Metabolic Panel: Recent Labs  Lab 08/16/21 0226 08/16/21 0538 08/16/21 1200 08/17/21 0542 08/18/21 0424 08/19/21 0356  NA 139  --  139 139 144 141  K 2.7*  --  2.8* 2.9* 3.9 3.8  CL 110  --  114* 115*  121* 119*  CO2 22  --  21* 21* 19* 19*  GLUCOSE 68*  --  67* 69* 96 90  BUN 12  --  10 8 5* <5*  CREATININE 0.69  --  0.63 0.56 0.54 0.52  CALCIUM 8.3*  --  7.3* 7.4* 7.5* 7.6*  MG  --  1.6*  --  1.6*  --  1.7   Liver Function Tests: Recent Labs  Lab 08/16/21 0300 08/16/21 1200 08/18/21 0424 08/19/21 0356  AST 1,269* 586* 88* 68*  ALT 372* 276* 141* 111*  ALKPHOS 220* 185* 151* 137*  BILITOT 1.2 1.9* 0.6 0.6  PROT 4.8* 4.5* 4.6* 4.5*  ALBUMIN 2.7* 2.4* 2.2* 2.2*   Recent Labs  Lab 08/18/21 1617  LIPASE 62*   No results for input(s): "AMMONIA" in the last 168 hours. CBC: Recent Labs  Lab 08/16/21 0226 08/16/21 1200 08/17/21 0542 08/19/21 0356  WBC 7.4  --  11.0* 8.4  NEUTROABS 6.2  --   --  4.3  HGB 6.4* 8.7* 8.0* 7.7*  HCT 19.1* 24.9* 23.1* 23.5*  MCV 103.2*  --  95.1 97.9  PLT 140*  --  83* 120*   Cardiac Enzymes: Recent Labs  Lab 08/16/21 0538  CKTOTAL 384*   BNP: Invalid input(s): "POCBNP" CBG: Recent Labs  Lab 08/16/21 1549 08/17/21 0724 08/17/21 0837  GLUCAP 81 61* 98   D-Dimer No results for input(s): "DDIMER" in the last 72 hours. Hgb A1c No results for input(s): "HGBA1C" in the last 72 hours. Lipid Profile No results for input(s): "CHOL", "HDL", "LDLCALC", "TRIG", "CHOLHDL", "LDLDIRECT" in the last 72 hours. Thyroid function studies No results for input(s): "TSH", "T4TOTAL", "T3FREE", "THYROIDAB" in the last 72 hours.  Invalid input(s): "FREET3" Anemia work up No results for input(s): "VITAMINB12", "FOLATE", "FERRITIN", "TIBC", "IRON", "RETICCTPCT" in the last 72 hours. Urinalysis    Component Value Date/Time   COLORURINE AMBER (A) 08/16/2021 1143   APPEARANCEUR HAZY (A) 08/16/2021 1143   LABSPEC 1.013 08/16/2021 1143   PHURINE 5.0 08/16/2021 1143   GLUCOSEU NEGATIVE 08/16/2021 1143   HGBUR NEGATIVE 08/16/2021 1143   BILIRUBINUR NEGATIVE 08/16/2021 1143   KETONESUR NEGATIVE 08/16/2021 1143   PROTEINUR NEGATIVE 08/16/2021 1143    UROBILINOGEN 0.2 09/12/2008 0034   NITRITE NEGATIVE 08/16/2021 1143   LEUKOCYTESUR NEGATIVE 08/16/2021  1143   Sepsis Labs Recent Labs  Lab 08/16/21 0226 08/17/21 0542 08/19/21 0356  WBC 7.4 11.0* 8.4   Microbiology Recent Results (from the past 240 hour(s))  SARS Coronavirus 2 by RT PCR (hospital order, performed in [facility] hospital lab) *cepheid single result test* Anterior Nasal Swab     Status: None   Collection Time: 08/16/21  2:12 AM   Specimen: Anterior Nasal Swab  Result Value Ref Range Status   SARS Coronavirus 2 by RT PCR NEGATIVE NEGATIVE Final    Comment: (NOTE) SARS-CoV-2 target nucleic acids are NOT DETECTED.  The SARS-CoV-2 RNA is generally detectable in upper and lower respiratory specimens during the acute phase of infection. The lowest concentration of SARS-CoV-2 viral copies this assay can detect is 250 copies / mL. A negative result does not preclude SARS-CoV-2 infection and should not be used as the sole basis for treatment or other patient management decisions.  A negative result may occur with improper specimen collection / handling, submission of specimen other than nasopharyngeal swab, presence of viral mutation(s) within the areas targeted by this assay, and inadequate number of viral copies (<250 copies / mL). A negative result must be combined with clinical observations, patient history, and epidemiological information.  Fact Sheet for Patients:   RoadLapTop.co.za  Fact Sheet for Healthcare Providers: http://kim-miller.com/  This test is not yet approved or  cleared by the [place] FDA and has been authorized for detection and/or diagnosis of SARS-CoV-2 by FDA under an Emergency Use Authorization (EUA).  This EUA will remain in effect (meaning this test can be used) for the duration of the COVID-19 declaration under Section 564(b)(1) of the Act, 21 U.S.C. section 360bbb-3(b)(1), unless the  authorization is terminated or revoked sooner.  Performed at [facility], [address] [place], [place] [address]   Blood culture (routine x 2)     Status: Abnormal (Preliminary result)   Collection Time: 08/16/21  2:24 AM   Specimen: BLOOD  Result Value Ref Range Status   Specimen Description   Final    BLOOD LEFT ANTECUBITAL Performed at [facility], [address] [place], [place] [address]    Special Requests   Final    BOTTLES DRAWN AEROBIC AND ANAEROBIC Blood Culture results may not be optimal due to an excessive volume of blood received in culture bottles Performed at [facility], [address], [place], [place] [address]    Culture  Setup Time   Final    GRAM NEGATIVE RODS IN BOTH AEROBIC AND ANAEROBIC BOTTLES CRITICAL RESULT CALLED TO, READ BACK BY AND VERIFIED WITH: PHARMD A. [facility] [id number] @1427FH  Performed at [facility], [address], [place], [place]    Culture ESCHERICHIA COLI (A)  Final   Report Status PENDING  Incomplete   Organism ID, Bacteria ESCHERICHIA COLI  Final      Susceptibility   Escherichia coli - MIC*    AMPICILLIN <=2 SENSITIVE Sensitive     CEFAZOLIN <=4 SENSITIVE Sensitive     CEFEPIME <=0.12 SENSITIVE Sensitive     CEFTAZIDIME <=1 SENSITIVE Sensitive     CEFTRIAXONE <=0.25 SENSITIVE Sensitive     CIPROFLOXACIN <=0.25 SENSITIVE Sensitive     GENTAMICIN <=1 SENSITIVE Sensitive     IMIPENEM <=0.25 SENSITIVE Sensitive     TRIMETH/SULFA <=20 SENSITIVE Sensitive     AMPICILLIN/SULBACTAM <=2 SENSITIVE Sensitive     PIP/TAZO <=4 SENSITIVE Sensitive     *  ESCHERICHIA COLI  Blood Culture ID Panel (Reflexed)     Status: Abnormal   Collection Time: 08/16/21  2:24 AM  Result Value Ref Range Status   Enterococcus faecalis NOT DETECTED NOT DETECTED Final   Enterococcus Faecium NOT DETECTED NOT DETECTED Final   Listeria monocytogenes NOT DETECTED NOT DETECTED Final    Staphylococcus species NOT DETECTED NOT DETECTED Final   Staphylococcus aureus (BCID) NOT DETECTED NOT DETECTED Final   Staphylococcus epidermidis NOT DETECTED NOT DETECTED Final   Staphylococcus lugdunensis NOT DETECTED NOT DETECTED Final   Streptococcus species NOT DETECTED NOT DETECTED Final   Streptococcus agalactiae NOT DETECTED NOT DETECTED Final   Streptococcus pneumoniae NOT DETECTED NOT DETECTED Final   Streptococcus pyogenes NOT DETECTED NOT DETECTED Final   A.calcoaceticus-baumannii NOT DETECTED NOT DETECTED Final   Bacteroides fragilis NOT DETECTED NOT DETECTED Final   Enterobacterales DETECTED (A) NOT DETECTED Final    Comment: Enterobacterales represent a large order of gram negative bacteria, not a single organism. CRITICAL RESULT CALLED TO, READ BACK BY AND VERIFIED WITH: PHARMD A. [facility] [id number] @1427FH     Enterobacter cloacae complex NOT DETECTED NOT DETECTED Final   Escherichia coli DETECTED (A) NOT DETECTED Final    Comment: CRITICAL RESULT CALLED TO, READ BACK BY AND VERIFIED WITH: PHARMD A. PHAM @1427FH     Klebsiella aerogenes NOT DETECTED NOT DETECTED Final   Klebsiella oxytoca NOT DETECTED NOT DETECTED Final   Klebsiella pneumoniae NOT DETECTED NOT DETECTED Final   Proteus species NOT DETECTED NOT DETECTED Final   Salmonella species NOT DETECTED NOT DETECTED Final   Serratia marcescens NOT DETECTED NOT DETECTED Final   Haemophilus influenzae NOT DETECTED NOT DETECTED Final   Neisseria meningitidis NOT DETECTED NOT DETECTED Final   Pseudomonas aeruginosa NOT DETECTED NOT DETECTED Final   Stenotrophomonas maltophilia NOT DETECTED NOT DETECTED Final   Candida albicans NOT DETECTED NOT DETECTED Final   Candida auris NOT DETECTED NOT DETECTED Final   Candida glabrata NOT DETECTED NOT DETECTED Final   Candida krusei NOT DETECTED NOT DETECTED Final   Candida parapsilosis NOT DETECTED NOT DETECTED Final   Candida tropicalis NOT DETECTED NOT DETECTED Final    Cryptococcus neoformans/gattii NOT DETECTED NOT DETECTED Final   CTX-M ESBL NOT DETECTED NOT DETECTED Final   Carbapenem resistance IMP NOT DETECTED NOT DETECTED Final   Carbapenem resistance KPC NOT DETECTED NOT DETECTED Final   Carbapenem resistance NDM NOT DETECTED NOT DETECTED Final   Carbapenem resist OXA 48 LIKE NOT DETECTED NOT DETECTED Final   Carbapenem resistance VIM NOT DETECTED NOT DETECTED Final    Comment: Performed at [facility], [address] [place], [address] [place]  Blood culture (routine x 2)     Status: None (Preliminary result)   Collection Time: 08/16/21  2:26 AM   Specimen: BLOOD  Result Value Ref Range Status   Specimen Description   Final    BLOOD RIGHT ANTECUBITAL Performed at [facility], [address] [place], [place]    Special Requests   Final    BOTTLES DRAWN AEROBIC AND ANAEROBIC Blood Culture adequate volume Performed at [facility], [address], [place], [place]    Culture  Setup Time   Final    GRAM NEGATIVE RODS AEROBIC BOTTLE ONLY CRITICAL VALUE NOTED.  VALUE IS CONSISTENT WITH PREVIOUSLY REPORTED AND CALLED VALUE. Performed at [facility], [address], [place], [address] [place]    Culture GRAM NEGATIVE  RODS  Final   Report Status PENDING  Incomplete     Time coordinating discharge: 35 minutes  SIGNED:   Glade Lloyd, MD  [facility] 08/19/2021, 11:12 AM

## 2021-08-19 NOTE — TOC Progression Note (Addendum)
Transition of Care [facility]) - Progression Note    Patient Details  Name: Renee [name] MRN: [id number] Date of Birth: [birth year]  Transition [facility]) CM/SW Contact  Golda Acre, RN Phone Number: 08/19/2021, 11:22 AM  Clinical Narrative:    Request for hhc -p.t. sent to [facility] for review.  Pt is [facility] ppo. [facility] unable to do due to staffing.  [facility] unable to staff. Request sent to [facility]-unable to take. Sent to [employer] cheryl rose-no answer after one hour Sent to [facility] angela coley- ndoes not taker [facility]bs\ Sent to [facility]-unable to take bcbs Md notified of no vendors to  do hhc p.t./ Patient and sister notified of the lack of being able to get hhc.  Instructed patient and sister to call the customer service number on the insurance card to find a provider in the network and in the area that is taking new patients. Referral to [facility] done. Expected Discharge Plan: Home/Self Care Barriers to Discharge: Continued Medical Work up  Expected Discharge Plan and Services Expected Discharge Plan: Home/Self Care   Discharge Planning Services: CM Consult   Living arrangements for the past 2 months: Single Family Home Expected Discharge Date: 08/19/21                                     Social Determinants of Health (SDOH) Interventions    Readmission Risk Interventions     No data to display

## 2021-08-19 NOTE — Consult Note (Signed)
   [facility] Inpatient Consult   08/19/2021  Kayley Zeiders 09-02-1975 [id number]  [facility] [[facility]]  Accountable Care Organization [ACO] Patient: Renee [name] [facility] PPO  Primary Care Provider:  Pcp, No Patient listed with Karyl Kinnier, [facility]  *Remote coverage and call patient listed at [facility]  *Referral email received from inpatient [facility] RNCM, and response for PCP needs sent back to inpatient [facility] RNCM, Rhonda.  This is a remote review and call placed t patient's room, call was answered but unable to hear me, call placed to phone listed and left a HIPAA appropriate voicemail message requesting a return call.  Plan:  Awaiting a return call.    For questions contact:   Charlesetta Shanks, RN BSN CCM [facility]  [phone] business mobile phone Toll free office [phone]  Fax number: [phone] [email or web address]

## 2021-08-19 NOTE — Progress Notes (Signed)
[facility] Gastroenterology Progress Note  Renee [name] 46 y.o. [birth year]   Subjective: Feels ok. Reportedly fell yesterday per her sister. Tolerating solid food.  Objective: Vital signs: Vitals:   08/18/21 2209 08/19/21 0435  BP: (!) 97/54 95/62  Pulse: 73 71  Resp: 18 18  Temp: 97.8 F (36.6 C) 98.4 F (36.9 C)  SpO2: 97% 100%    Physical Exam: Gen: lethargic, no acute distress, well-nourished HEENT: anicteric sclera CV: RRR Chest: CTA B Abd: soft, nontender, nondistended, +BS Ext: no edema  Lab Results: Recent Labs    08/17/21 0542 08/18/21 0424 08/19/21 0356  NA 139 144 141  K 2.9* 3.9 3.8  CL 115* 121* 119*  CO2 21* 19* 19*  GLUCOSE 69* 96 90  BUN 8 5* <5*  CREATININE 0.56 0.54 0.52  CALCIUM 7.4* 7.5* 7.6*  MG 1.6*  --  1.7   Recent Labs    08/18/21 0424 08/19/21 0356  AST 88* 68*  ALT 141* 111*  ALKPHOS 151* 137*  BILITOT 0.6 0.6  PROT 4.6* 4.5*  ALBUMIN 2.2* 2.2*   Recent Labs    08/17/21 0542 08/19/21 0356  WBC 11.0* 8.4  NEUTROABS  --  4.3  HGB 8.0* 7.7*  HCT 23.1* 23.5*  MCV 95.1 97.9  PLT 83* 120*      Assessment/Plan: NSAID-induced gastric ulcers - stable. MRCP negative for CBD stones or biliary dilation. PPI PO BID for 3 months and then QD. F/U with [facility] in 6-8 weeks. Avoid NSAIDs. Will sign off. Call if questions.   [name] 08/19/2021, 11:11 AM  Questions please call [phone] Patient ID: Renee [name], female   DOB: [birth year], 46 y.o.   MRN: [id number]

## 2021-08-20 LAB — CULTURE, BLOOD (ROUTINE X 2): Special Requests: ADEQUATE

## 2021-09-07 ENCOUNTER — Ambulatory Visit: Payer: BC Managed Care – PPO | Admitting: Orthopedic Surgery

## 2021-09-08 DIAGNOSIS — D696 Thrombocytopenia, unspecified: Secondary | ICD-10-CM | POA: Diagnosis not present

## 2021-09-08 DIAGNOSIS — F1510 Other stimulant abuse, uncomplicated: Secondary | ICD-10-CM | POA: Diagnosis not present

## 2021-09-08 DIAGNOSIS — K254 Chronic or unspecified gastric ulcer with hemorrhage: Secondary | ICD-10-CM | POA: Diagnosis not present

## 2021-09-08 DIAGNOSIS — R7989 Other specified abnormal findings of blood chemistry: Secondary | ICD-10-CM | POA: Diagnosis not present

## 2021-09-08 DIAGNOSIS — R17 Unspecified jaundice: Secondary | ICD-10-CM | POA: Diagnosis not present

## 2021-09-08 DIAGNOSIS — F1011 Alcohol abuse, in remission: Secondary | ICD-10-CM | POA: Diagnosis not present

## 2021-09-08 DIAGNOSIS — D539 Nutritional anemia, unspecified: Secondary | ICD-10-CM | POA: Diagnosis not present

## 2021-09-10 ENCOUNTER — Encounter: Payer: Self-pay | Admitting: Surgery

## 2021-09-10 ENCOUNTER — Ambulatory Visit: Payer: BC Managed Care – PPO | Admitting: Surgery

## 2021-09-10 ENCOUNTER — Ambulatory Visit (INDEPENDENT_AMBULATORY_CARE_PROVIDER_SITE_OTHER): Payer: BC Managed Care – PPO | Admitting: Surgery

## 2021-09-10 VITALS — BP 118/80 | HR 72 | Ht 67.0 in | Wt 160.0 lb

## 2021-09-10 DIAGNOSIS — G5691 Unspecified mononeuropathy of right upper limb: Secondary | ICD-10-CM

## 2021-09-10 DIAGNOSIS — G5692 Unspecified mononeuropathy of left upper limb: Secondary | ICD-10-CM

## 2021-09-10 DIAGNOSIS — G5693 Unspecified mononeuropathy of bilateral upper limbs: Secondary | ICD-10-CM
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
## 2021-09-21 DIAGNOSIS — D696 Thrombocytopenia, unspecified: Secondary | ICD-10-CM | POA: Diagnosis not present

## 2021-09-21 DIAGNOSIS — D539 Nutritional anemia, unspecified: Secondary | ICD-10-CM | POA: Diagnosis not present

## 2021-09-21 DIAGNOSIS — K259 Gastric ulcer, unspecified as acute or chronic, without hemorrhage or perforation: Secondary | ICD-10-CM | POA: Diagnosis not present

## 2021-09-21 DIAGNOSIS — R7989 Other specified abnormal findings of blood chemistry: Secondary | ICD-10-CM | POA: Diagnosis not present

## 2021-09-21 DIAGNOSIS — K7011 Alcoholic hepatitis with ascites: Secondary | ICD-10-CM | POA: Diagnosis not present

## 2021-10-05 ENCOUNTER — Ambulatory Visit: Payer: BC Managed Care – PPO | Admitting: Orthopaedic Surgery

## 2021-10-22 DIAGNOSIS — F3176 Bipolar disorder, in full remission, most recent episode depressed: Secondary | ICD-10-CM | POA: Diagnosis not present

## 2021-10-22 DIAGNOSIS — F3174 Bipolar disorder, in full remission, most recent episode manic: Secondary | ICD-10-CM | POA: Diagnosis not present

## 2021-10-22 DIAGNOSIS — F410 Panic disorder [episodic paroxysmal anxiety] without agoraphobia: Secondary | ICD-10-CM | POA: Diagnosis not present

## 2021-10-22 DIAGNOSIS — F900 Attention-deficit hyperactivity disorder, predominantly inattentive type: Secondary | ICD-10-CM | POA: Diagnosis not present

## 2022-03-08 ENCOUNTER — Other Ambulatory Visit: Payer: Self-pay

## 2022-03-08 ENCOUNTER — Emergency Department (HOSPITAL_COMMUNITY): Payer: BC Managed Care – PPO

## 2022-03-08 ENCOUNTER — Emergency Department (HOSPITAL_COMMUNITY)
Admission: EM | Admit: 2022-03-08 | Discharge: 2022-03-09 | Payer: BC Managed Care – PPO | Attending: Emergency Medicine | Admitting: Emergency Medicine

## 2022-03-08 DIAGNOSIS — Z91040 Latex allergy status: Secondary | ICD-10-CM | POA: Insufficient documentation

## 2022-03-08 DIAGNOSIS — R4182 Altered mental status, unspecified: Secondary | ICD-10-CM | POA: Diagnosis present

## 2022-03-08 DIAGNOSIS — Z79899 Other long term (current) drug therapy: Secondary | ICD-10-CM | POA: Insufficient documentation

## 2022-03-08 DIAGNOSIS — Z1152 Encounter for screening for COVID-19: Secondary | ICD-10-CM | POA: Insufficient documentation

## 2022-03-08 DIAGNOSIS — A419 Sepsis, unspecified organism: Secondary | ICD-10-CM | POA: Diagnosis present

## 2022-03-08 DIAGNOSIS — H05222 Edema of left orbit: Secondary | ICD-10-CM | POA: Diagnosis not present

## 2022-03-08 DIAGNOSIS — N390 Urinary tract infection, site not specified: Secondary | ICD-10-CM | POA: Diagnosis not present

## 2022-03-08 DIAGNOSIS — Z23 Encounter for immunization: Secondary | ICD-10-CM | POA: Diagnosis not present

## 2022-03-08 DIAGNOSIS — K5130 Ulcerative (chronic) rectosigmoiditis without complications: Secondary | ICD-10-CM | POA: Insufficient documentation

## 2022-03-08 DIAGNOSIS — T68XXXA Hypothermia, initial encounter: Secondary | ICD-10-CM | POA: Diagnosis not present

## 2022-03-08 DIAGNOSIS — G9340 Encephalopathy, unspecified: Secondary | ICD-10-CM | POA: Diagnosis not present

## 2022-03-08 DIAGNOSIS — I3139 Other pericardial effusion (noninflammatory): Secondary | ICD-10-CM

## 2022-03-08 DIAGNOSIS — K529 Noninfective gastroenteritis and colitis, unspecified: Secondary | ICD-10-CM

## 2022-03-08 DIAGNOSIS — R600 Localized edema: Principal | ICD-10-CM

## 2022-03-08 LAB — CBC WITH DIFFERENTIAL/PLATELET
Abs Immature Granulocytes: 0.04 10*3/uL (ref 0.00–0.07)
Basophils Absolute: 0.1 10*3/uL (ref 0.0–0.1)
Basophils Relative: 1 %
Eosinophils Absolute: 0.1 10*3/uL (ref 0.0–0.5)
Eosinophils Relative: 1 %
HCT: 29.6 % — ABNORMAL LOW (ref 36.0–46.0)
Hemoglobin: 9.6 g/dL — ABNORMAL LOW (ref 12.0–15.0)
Immature Granulocytes: 0 %
Lymphocytes Relative: 64 %
Lymphs Abs: 5.7 10*3/uL — ABNORMAL HIGH (ref 0.7–4.0)
MCH: 32.7 pg (ref 26.0–34.0)
MCHC: 32.4 g/dL (ref 30.0–36.0)
MCV: 100.7 fL — ABNORMAL HIGH (ref 80.0–100.0)
Monocytes Absolute: 0.3 10*3/uL (ref 0.1–1.0)
Monocytes Relative: 3 %
Neutro Abs: 2.8 10*3/uL (ref 1.7–7.7)
Neutrophils Relative %: 31 %
Platelets: 90 10*3/uL — ABNORMAL LOW (ref 150–400)
RBC: 2.94 MIL/uL — ABNORMAL LOW (ref 3.87–5.11)
RDW: 15.9 % — ABNORMAL HIGH (ref 11.5–15.5)
WBC: 8.9 10*3/uL (ref 4.0–10.5)
nRBC: 0.2 % (ref 0.0–0.2)

## 2022-03-08 LAB — RAPID URINE DRUG SCREEN, HOSP PERFORMED
Amphetamines: POSITIVE — AB
Barbiturates: NOT DETECTED
Benzodiazepines: NOT DETECTED
Cocaine: NOT DETECTED
Opiates: NOT DETECTED
Tetrahydrocannabinol: NOT DETECTED

## 2022-03-08 LAB — COMPREHENSIVE METABOLIC PANEL
ALT: 89 U/L — ABNORMAL HIGH (ref 0–44)
AST: 199 U/L — ABNORMAL HIGH (ref 15–41)
Albumin: 3.1 g/dL — ABNORMAL LOW (ref 3.5–5.0)
Alkaline Phosphatase: 137 U/L — ABNORMAL HIGH (ref 38–126)
Anion gap: 8 (ref 5–15)
BUN: 11 mg/dL (ref 6–20)
CO2: 23 mmol/L (ref 22–32)
Calcium: 8.1 mg/dL — ABNORMAL LOW (ref 8.9–10.3)
Chloride: 112 mmol/L — ABNORMAL HIGH (ref 98–111)
Creatinine, Ser: 0.79 mg/dL (ref 0.44–1.00)
GFR, Estimated: >60 mL/min (ref >60)
Glucose, Bld: 82 mg/dL (ref 70–99)
Potassium: 3 mmol/L — ABNORMAL LOW (ref 3.5–5.1)
Sodium: 143 mmol/L (ref 135–145)
Total Bilirubin: 1.7 mg/dL — ABNORMAL HIGH (ref 0.3–1.2)
Total Protein: 6.3 g/dL — ABNORMAL LOW (ref 6.5–8.1)

## 2022-03-08 LAB — RESP PANEL BY RT-PCR (RSV, FLU A&B, COVID)  RVPGX2
Influenza A by PCR: NEGATIVE
Influenza B by PCR: NEGATIVE
Resp Syncytial Virus by PCR: NEGATIVE
SARS Coronavirus 2 by RT PCR: NEGATIVE

## 2022-03-08 LAB — PROTIME-INR
INR: 1.3 — ABNORMAL HIGH (ref 0.8–1.2)
Prothrombin Time: 15.6 seconds — ABNORMAL HIGH (ref 11.4–15.2)

## 2022-03-08 LAB — URINALYSIS, W/ REFLEX TO CULTURE (INFECTION SUSPECTED)
Bilirubin Urine: NEGATIVE
Glucose, UA: NEGATIVE mg/dL
Ketones, ur: NEGATIVE mg/dL
Nitrite: NEGATIVE
Protein, ur: 100 mg/dL — AB
RBC / HPF: >50 RBC/hpf (ref 0–5)
Specific Gravity, Urine: 1.015 (ref 1.005–1.030)
WBC, UA: >50 WBC/hpf (ref 0–5)
pH: 6 (ref 5.0–8.0)

## 2022-03-08 LAB — MAGNESIUM: Magnesium: 1.8 mg/dL (ref 1.7–2.4)

## 2022-03-08 LAB — I-STAT CHEM 8, ED
BUN: 9 mg/dL (ref 6–20)
Calcium, Ion: 1.08 mmol/L — ABNORMAL LOW (ref 1.15–1.40)
Chloride: 111 mmol/L (ref 98–111)
Creatinine, Ser: 0.7 mg/dL (ref 0.44–1.00)
Glucose, Bld: 77 mg/dL (ref 70–99)
HCT: 28 % — ABNORMAL LOW (ref 36.0–46.0)
Hemoglobin: 9.5 g/dL — ABNORMAL LOW (ref 12.0–15.0)
Potassium: 3 mmol/L — ABNORMAL LOW (ref 3.5–5.1)
Sodium: 147 mmol/L — ABNORMAL HIGH (ref 135–145)
TCO2: 23 mmol/L (ref 22–32)

## 2022-03-08 LAB — LACTIC ACID, PLASMA
Lactic Acid, Venous: 2.2 mmol/L (ref 0.5–1.9)
Lactic Acid, Venous: 1.6 mmol/L (ref 0.5–1.9)

## 2022-03-08 LAB — BLOOD GAS, VENOUS
Acid-Base Excess: 2.2 mmol/L — ABNORMAL HIGH (ref 0.0–2.0)
Bicarbonate: 27.3 mmol/L (ref 20.0–28.0)
O2 Saturation: 30 %
Patient temperature: 37
pCO2, Ven: 43 mmHg — ABNORMAL LOW (ref 44–60)
pH, Ven: 7.41 (ref 7.25–7.43)
pO2, Ven: <31 mmHg — CL (ref 32–45)

## 2022-03-08 LAB — LIPASE, BLOOD: Lipase: 82 U/L — ABNORMAL HIGH (ref 11–51)

## 2022-03-08 LAB — CBG MONITORING, ED: Glucose-Capillary: 81 mg/dL (ref 70–99)

## 2022-03-08 MED ORDER — HYDROCORTISONE SOD SUC (PF) 100 MG IJ SOLR
100.0000 mg | Freq: Once | INTRAMUSCULAR | Status: AC
  Administered 2022-03-08: 100 mg via INTRAVENOUS
  Filled 2022-03-08: qty 2

## 2022-03-08 MED ORDER — SODIUM CHLORIDE 0.9 % IV SOLN
2.0000 g | Freq: Once | INTRAVENOUS | Status: AC
  Administered 2022-03-08: 2 g via INTRAVENOUS
  Filled 2022-03-08: qty 12.5

## 2022-03-08 MED ORDER — POTASSIUM CHLORIDE 10 MEQ/100ML IV SOLN
10.0000 meq | INTRAVENOUS | Status: AC
  Administered 2022-03-08 – 2022-03-09 (×5): 10 meq via INTRAVENOUS
  Filled 2022-03-08 (×5): qty 100

## 2022-03-08 MED ORDER — VANCOMYCIN HCL IN DEXTROSE 1-5 GM/200ML-% IV SOLN
1000.0000 mg | Freq: Once | INTRAVENOUS | Status: AC
  Administered 2022-03-08: 1000 mg via INTRAVENOUS
  Filled 2022-03-08: qty 200

## 2022-03-08 MED ORDER — SODIUM CHLORIDE 0.9 % IV BOLUS (SEPSIS)
250.0000 mL | Freq: Once | INTRAVENOUS | Status: AC
  Administered 2022-03-08: 250 mL via INTRAVENOUS

## 2022-03-08 MED ORDER — IOHEXOL 350 MG/ML SOLN
100.0000 mL | Freq: Once | INTRAVENOUS | Status: AC | PRN
  Administered 2022-03-08: 100 mL via INTRAVENOUS

## 2022-03-08 MED ORDER — METRONIDAZOLE 500 MG/100ML IV SOLN
500.0000 mg | Freq: Once | INTRAVENOUS | Status: AC
  Administered 2022-03-08: 500 mg via INTRAVENOUS
  Filled 2022-03-08: qty 100

## 2022-03-08 MED ORDER — SODIUM CHLORIDE 0.9 % IV BOLUS (SEPSIS)
1000.0000 mL | Freq: Once | INTRAVENOUS | Status: AC
Start: 2022-03-08 — End: 2022-03-08
  Administered 2022-03-08: 1000 mL via INTRAVENOUS

## 2022-03-08 MED ORDER — SODIUM CHLORIDE 0.9 % IV SOLN: INTRAVENOUS | Status: DC

## 2022-03-08 MED ORDER — SODIUM CHLORIDE 0.9 % IV BOLUS (SEPSIS)
1000.0000 mL | Freq: Once | INTRAVENOUS | Status: AC
  Administered 2022-03-08: 1000 mL via INTRAVENOUS

## 2022-03-08 NOTE — Progress Notes (Signed)
A consult was received from an ED physician for vancomycin and cefepime per pharmacy dosing.  The patient's profile has been reviewed for ht/wt/allergies/indication/available labs.   A one time order has been placed for vancomycin 1g and cefepime 2g.  Further antibiotics/pharmacy consults should be ordered by admitting physician if indicated.                       Thank you, Peggyann Juba, PharmD, BCPS 03/08/2022  7:23 PM

## 2022-03-08 NOTE — ED Triage Notes (Signed)
BIB EMS boyfriend found pt on couch alerted mental. Will only say "okay", "stop it" 'No" per EMS pt appeared to have been laying on couch for a long period of time in soiled pants. Pt left eye crusted and swollen pt unable to open eyes. Pt right eye also swollen but able to open slightly. Dr. Doren Custard in room to assess during triage.
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
## 2022-03-08 NOTE — ED Notes (Signed)
Bear hugger applied 

## 2022-03-08 NOTE — ED Notes (Signed)
Patient transported to CT 
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
## 2022-03-08 NOTE — ED Provider Notes (Incomplete)
[name] EMERGENCY DEPARTMENT AT [facility] Provider Note   CSN: [id number] Arrival date & time: 03/08/22  1858     History {Add pertinent medical, surgical, social history, OB history to HPI:1} Chief Complaint  Patient presents with   Altered Mental Status    Renee [name] is a 47 y.o. female.   Altered Mental Status Patient presents for altered mental status.  Medical history includes hypothyroidism, chronic pain, anxiety, bipolar disorder, alcohol dependence, fibromyalgia, anemia, GERD.  Initial history is provided by EMS.  Patient's significant other was reportedly out of town for the past week.  He did note worsening confusion when speaking to the patient on the phone over that week.  When he returned home today, she was found on the couch, soiled, and confused.  From the description of her position, it appeared that she had been on the couch for several days.  Patient is unable to provide any history.  She is only able to say "yes", and "okay".     Home Medications Prior to Admission medications   Medication Sig Start Date End Date Taking? Authorizing Provider  fluticasone (FLONASE) 50 MCG/ACT nasal spray Place 2 sprays into both nostrils daily as needed for allergies. 08/19/21   Aline August, MD  folic acid (FOLVITE) 1 MG tablet Take 1 tablet (1 mg total) by mouth daily. 08/20/21   Aline August, MD  levothyroxine (SYNTHROID) 200 MCG tablet TAKE 1 TABLET(200 MCG) BY MOUTH DAILY BEFORE BREAKFAST Patient taking differently: Take 200 mcg by mouth daily before breakfast. 08/17/19   Jacelyn Pi, Lilia Argue, MD  montelukast (SINGULAIR) 10 MG tablet Take 1 tablet (10 mg total) by mouth at bedtime. Patient taking differently: Take 10 mg by mouth daily as needed (for allergires). 01/21/20   Just, Laurita Quint, FNP  Multiple Vitamin (MULTI-VITAMIN) tablet Take by mouth.    [provider]  omeprazole (PRILOSEC) 40 MG capsule Take 1 capsule (40 mg total) by mouth 2  (two) times daily before a meal.  **not covered by insurance** 08/19/21 09/18/21  Aline August, MD  ondansetron (ZOFRAN-ODT) 8 MG disintegrating tablet Take 1 tablet (8 mg total) by mouth 3 (three) times daily as needed. 08/21/20   Lindell Spar, MD  thiamine (VITAMIN B1) 100 MG tablet Take 1 tablet (100 mg total) by mouth daily. 08/20/21   Aline August, MD      Allergies    Morphine and related, Penicillins, Diphenhydramine, Latex, Synthroid [levothyroxine], and Allopurinol    Review of Systems   Review of Systems  Unable to perform ROS: Mental status change    Physical Exam Updated Vital Signs There were no vitals taken for this visit. Physical Exam Vitals and nursing note reviewed.  Constitutional:      General: She is not in acute distress.    Appearance: She is well-developed. She is ill-appearing. She is not toxic-appearing or diaphoretic.  HENT:     Head: Normocephalic and atraumatic.     Right Ear: External ear normal.     Left Ear: External ear normal.     Nose: Nose normal.     Mouth/Throat:     Mouth: Mucous membranes are dry.  Eyes:     Extraocular Movements: Extraocular movements intact.     Conjunctiva/sclera: Conjunctivae normal.     Comments: Bilateral crusty discharge.  Left periorbital swelling.  Clouding of the left cornea.  Cardiovascular:     Rate and Rhythm: Normal rate and regular rhythm.  Heart sounds: No murmur heard. Pulmonary:     Effort: Pulmonary effort is normal. No respiratory distress.     Breath sounds: Normal breath sounds. No wheezing or rales.  Chest:     Chest wall: No tenderness.  Abdominal:     General: There is no distension.     Palpations: Abdomen is soft.     Tenderness: There is no abdominal tenderness.  Musculoskeletal:        General: No swelling.     Cervical back: Neck supple. No rigidity.     Right lower leg: Edema present.     Left lower leg: Edema present.  Skin:    General: Skin is warm and dry.     Capillary  Refill: Capillary refill takes less than 2 seconds.     Comments: Mottled skin in lower extremities.  Neurological:     General: No focal deficit present.     Mental Status: She is alert. She is disoriented.     ED Results / Procedures / Treatments   Labs (all labs ordered are listed, but only abnormal results are displayed) Labs Reviewed - No data to display  EKG None  Radiology No results found.  Procedures Procedures  {Document cardiac monitor, telemetry assessment procedure when appropriate:1}  Medications Ordered in ED Medications - No data to display  ED Course/ Medical Decision Making/ A&P   {   Click here for ABCD2, HEART and other calculatorsREFRESH Note before signing :1}                          Medical Decision Making Amount and/or Complexity of Data Reviewed Labs: ordered. Radiology: ordered. ECG/medicine tests: ordered.  Risk Prescription drug management.   This patient presents to the ED for concern of ***, this involves an extensive number of treatment options, and is a complaint that carries with it a high risk of complications and morbidity.  The differential diagnosis includes ***   Co morbidities that complicate the patient evaluation  ***   Additional history obtained:  Additional history obtained from *** External records from outside source obtained and reviewed including ***   Lab Tests:  I Ordered, and personally interpreted labs.  The pertinent results include:  ***   Imaging Studies ordered:  I ordered imaging studies including ***  I independently visualized and interpreted imaging which showed *** I agree with the radiologist interpretation   Cardiac Monitoring: / EKG:  The patient was maintained on a cardiac monitor.  I personally viewed and interpreted the cardiac monitored which showed an underlying rhythm of: ***   Consultations Obtained:  I requested consultation with the ***,  and discussed lab and imaging  findings as well as pertinent plan - they recommend: ***   Problem List / ED Course / Critical interventions / Medication management  Patient presented for altered mental status.  History is severely limited.  EMS reports that her significant other noticed a progressive confusion over the past week.  This was all without surrounding the rest of over the telephone.  When examined today, she was severely altered and sitting on the couch.  She was well for self and appeared to have been sitting there for days.  On arrival, patient is somnolent but arousable.  She is unable to provide any history.  She will only say repetitive things such as "okay", and "stop it".  She has right periorbital swelling.  I was able to get a  brief glimpse of the orbit which did not show significant injection.  It does appear that she has a clouded cornea.  Chronicity of this is unknown.  She is certainly tender around that eye.  Currently, breathing is unlabored and lungs are clear to auscultation.  Her abdomen is soft.  Vital signs are notable for soft blood pressures and hypothermia.  Patient was treated empirically for sepsis with IV fluids and broad-spectrum antibiotics.  Warming blanket was applied.  Patient did have improved blood pressures with IV fluids.  She remained hypothermic, identified on temperature sensing Foley catheter.  Stress of steroid was ordered.  Urinalysis is consistent with infection.  She underwent CT imaging of head, orbits, chest, abdomen, pelvis.  On CT of orbits, there appears to be a choroidal detachment.  I spoke with ophthalmologist on-call, Dr. Lucianne Lei, who will come and see the patient in the ED.  Additional CTs findings include a pericardial effusion.  I did evaluate this on bedside ultrasound and there does not appear to be tamponade physiology.  She has wall thickening of sigmoid colon and rectum with associated inflammation concerning for proctocolitis.  There is also gallbladder wall edema.  When  reassessing the patient, it does appear that she has some right upper quadrant tenderness.  Ultrasound was ordered to further characterize.***. I ordered medication including ***  for ***  Reevaluation of the patient after these medicines showed that the patient {resolved/improved/worsened:23923::"improved"} I have reviewed the patients home medicines and have made adjustments as needed   Social Determinants of Health:  ***   Test / Admission - Considered:  ***   {Document critical care time when appropriate:1} {Document review of labs and clinical decision tools ie heart score, Chads2Vasc2 etc:1}  {Document your independent review of radiology images, and any outside records:1} {Document your discussion with family members, caretakers, and with consultants:1} {Document social determinants of health affecting pt's care:1} {Document your decision making why or why not admission, treatments were needed:1} Final Clinical Impression(s) / ED Diagnoses Final diagnoses:  None    Rx / DC Orders ED Discharge Orders     None

## 2022-03-08 NOTE — Sepsis Progress Note (Signed)
Following for sepsis monitoring ?

## 2022-03-09 DIAGNOSIS — H05222 Edema of left orbit: Secondary | ICD-10-CM | POA: Diagnosis not present

## 2022-03-09 DIAGNOSIS — Z79899 Other long term (current) drug therapy: Secondary | ICD-10-CM | POA: Diagnosis not present

## 2022-03-09 DIAGNOSIS — Z23 Encounter for immunization: Secondary | ICD-10-CM | POA: Diagnosis not present

## 2022-03-09 DIAGNOSIS — A419 Sepsis, unspecified organism: Secondary | ICD-10-CM

## 2022-03-09 DIAGNOSIS — T68XXXA Hypothermia, initial encounter: Secondary | ICD-10-CM | POA: Diagnosis not present

## 2022-03-09 DIAGNOSIS — N390 Urinary tract infection, site not specified: Secondary | ICD-10-CM | POA: Diagnosis not present

## 2022-03-09 DIAGNOSIS — Z1152 Encounter for screening for COVID-19: Secondary | ICD-10-CM | POA: Diagnosis not present

## 2022-03-09 DIAGNOSIS — Z91040 Latex allergy status: Secondary | ICD-10-CM | POA: Diagnosis not present

## 2022-03-09 DIAGNOSIS — R4182 Altered mental status, unspecified: Secondary | ICD-10-CM | POA: Diagnosis present

## 2022-03-09 DIAGNOSIS — K5130 Ulcerative (chronic) rectosigmoiditis without complications: Secondary | ICD-10-CM | POA: Diagnosis not present

## 2022-03-09 DIAGNOSIS — G9340 Encephalopathy, unspecified: Secondary | ICD-10-CM | POA: Diagnosis not present

## 2022-03-09 HISTORY — DX: Sepsis, unspecified organism: A41.9

## 2022-03-09 LAB — T4, FREE: Free T4: <0.25 ng/dL — ABNORMAL LOW (ref 0.61–1.12)

## 2022-03-09 LAB — TSH: TSH: 177.184 u[IU]/mL — ABNORMAL HIGH (ref 0.350–4.500)

## 2022-03-09 MED ORDER — LEVOFLOXACIN IN D5W 500 MG/100ML IV SOLN
500.0000 mg | INTRAVENOUS | Status: DC
  Administered 2022-03-09: 500 mg via INTRAVENOUS
  Filled 2022-03-09: qty 100

## 2022-03-09 MED ORDER — LEVOTHYROXINE SODIUM 100 MCG PO TABS: 200.0000 ug | ORAL_TABLET | Freq: Every day | ORAL | Status: DC

## 2022-03-09 MED ORDER — ETOMIDATE 2 MG/ML IV SOLN
0.0500 mg/kg | Freq: Once | INTRAVENOUS | Status: AC
  Administered 2022-03-09: 3.6 mg via INTRAVENOUS
  Filled 2022-03-09: qty 10

## 2022-03-09 MED ORDER — GATIFLOXACIN 0.5 % OP SOLN
1.0000 [drp] | OPHTHALMIC | Status: DC
  Administered 2022-03-09 (×5): 1 [drp] via OPHTHALMIC
  Filled 2022-03-09: qty 2.5

## 2022-03-09 MED ORDER — ONDANSETRON HCL 4 MG/2ML IJ SOLN
4.0000 mg | Freq: Once | INTRAMUSCULAR | Status: AC
  Administered 2022-03-09: 4 mg via INTRAVENOUS
  Filled 2022-03-09: qty 2

## 2022-03-09 MED ORDER — LEVOTHYROXINE SODIUM 100 MCG PO TABS
200.0000 ug | ORAL_TABLET | Freq: Every day | ORAL | Status: DC
  Administered 2022-03-09: 200 ug via ORAL
  Filled 2022-03-09: qty 2

## 2022-03-09 MED ORDER — TETANUS-DIPHTH-ACELL PERTUSSIS 5-2.5-18.5 LF-MCG/0.5 IM SUSY
0.5000 mL | PREFILLED_SYRINGE | Freq: Once | INTRAMUSCULAR | Status: AC
  Administered 2022-03-09: 0.5 mL via INTRAMUSCULAR
  Filled 2022-03-09: qty 0.5

## 2022-03-09 MED ORDER — TOBRAMYCIN 0.3 % OP SOLN
1.0000 [drp] | OPHTHALMIC | Status: DC
  Administered 2022-03-09 (×5): 1 [drp] via OPHTHALMIC
  Filled 2022-03-09: qty 5

## 2022-03-09 NOTE — ED Notes (Signed)
Spoke with mother of the patient to advise she was being transferred to [facility] ED.

## 2022-03-09 NOTE — Progress Notes (Signed)
RT at bedside for sedation and eye exam. Minor, temporary airway manipulation required with procedure. VSS throughout. Patient awake with patent airway upon RT exit.

## 2022-03-09 NOTE — ED Notes (Addendum)
Repot given to Charge Rn at [facility] ED, Melida Quitter, RN. [facility] called for transport, [phone]. Dr Wyn Forster is the accepting Dr.

## 2022-03-09 NOTE — ED Notes (Signed)
Patient stated she felt like she was soiled from a bowel movement. Patient was checked and reassured that she is currently clean. No stool was present.

## 2022-03-09 NOTE — ED Notes (Signed)
[facility] also called for transport, however ETA is not until after 10am.

## 2022-03-09 NOTE — ED Provider Notes (Addendum)
I assumed care of this patient.  Please see previous provider note for further details of Hx, PE.  Briefly patient is a 47 y.o. female here for altered mental status.  Patient noted to be septic with likely source from proctocolitis.  UA was concerning for infection initially but this is likely reactive from the proctocolitis.  Patient was started on empiric antibiotics and IV fluids.  Lactic acid cleared.  During her evaluation, they noted left eye edema.  On CT head, there was concern for possible chondroid detachment.  Ophthalmology was consulted who evaluated patient in the emergency department. Hospitalist was called to admit patient to manage sepsis.  Spoke with Dr. Lucianne Lei (Ophtho) who recommended transferring the patient to [facility] for ophthalmology care.  She spoke with Dr. Awanda Mink from ophthalmology.  Since patient is septic from proctocolitis, she will need to be admitted to hospitalist service.  Will discuss this with the transfer line.  Admission to this facility was cancelled.  I spoke with [facility] transfer line who reported that there are no beds available at [facility] and they are unable to accept the patient.  I spoke with Dr. Lucianne Lei again.  We will seek transfer to other facilities.  If unsuccessful Dr. Lucianne Lei will reevaluate the patient in the morning with slit lamp.  I spoke to [facility] transfer team, who discussed the case with their on-call ophthalmologist.  I spoke with Dr. Roslynn Amble who is the ophthalmology resident.  After discussion, [facility] excepted the patient for ER to ER transfer.  Accepting physician will be Dr. Donnita Falls  .Critical Care  Performed by: Fatima Blank, MD Authorized by: Fatima Blank, MD   Critical care provider statement:    Critical care time (minutes):  80   Critical care time was exclusive of:  Separately billable procedures and treating other patients   Critical care was necessary to treat or prevent imminent or life-threatening deterioration of the  following conditions:  Sepsis (possible globe rupture)   Critical care was time spent personally by me on the following activities:  Development of treatment plan with patient or surrogate, discussions with consultants, evaluation of patient's response to treatment, examination of patient, obtaining history from patient or surrogate, review of old charts, re-evaluation of patient's condition, pulse oximetry, ordering and review of radiographic studies, ordering and review of laboratory studies and ordering and performing treatments and interventions   Care discussed with: admitting provider and accepting provider at another facility         Fatima Blank, MD 03/09/22 [phone]

## 2022-03-11 LAB — URINE CULTURE: Culture: >=100000 — AB

## 2022-03-11 LAB — T3, FREE: T3, Free: 0.5 pg/mL — ABNORMAL LOW (ref 2.0–4.4)

## 2022-03-12 ENCOUNTER — Telehealth (HOSPITAL_BASED_OUTPATIENT_CLINIC_OR_DEPARTMENT_OTHER): Payer: Self-pay | Admitting: Emergency Medicine

## 2022-03-12 NOTE — Telephone Encounter (Signed)
Post ED Visit - Positive Culture Follow-up  Culture report reviewed by antimicrobial stewardship pharmacist: [facility] [facility] Team []$  Elenor Quinones, Pharm.D. []$  Heide Guile, Pharm.D., BCPS AQ-ID []$  Parks Neptune, Pharm.D., BCPS []$  Alycia Rossetti, Pharm.D., BCPS []$  [place]ray, [place].D., BCPS, AAHIVP []$  Legrand Como, Pharm.D., BCPS, AAHIVP []$  Salome Arnt, PharmD, BCPS []$  Johnnette Gourd, PharmD, BCPS []$  Hughes Better, PharmD, BCPS []$  Leeroy Cha, PharmD []$  Laqueta Linden, PharmD, BCPS []$  Albertina Parr, PharmD  [facility] Team []$  Leodis Sias, PharmD []$  Lindell Spar, PharmD []$  Royetta Asal, PharmD []$  Graylin Shiver, Rph []$  Rema Fendt) Glennon Mac, PharmD []$  Arlyn Dunning, PharmD []$  Netta Cedars, PharmD []$  Dia Sitter, PharmD []$  Leone Haven, PharmD []$  Gretta Arab, PharmD []$  Theodis Shove, PharmD []$  Peggyann Juba, PharmD []$  Reuel Boom, PharmD   Positive urine culture Pt transferred to [facility] for ophthalmic care, MD aware of UTI, no further patient follow-up is required at this time.  Hazle Nordmann 03/12/2022, 12:13 PM

## 2022-03-13 LAB — CULTURE, BLOOD (ROUTINE X 2): Culture: NO GROWTH

## 2022-08-04 ENCOUNTER — Encounter (HOSPITAL_BASED_OUTPATIENT_CLINIC_OR_DEPARTMENT_OTHER): Payer: Self-pay | Admitting: Emergency Medicine

## 2022-08-04 ENCOUNTER — Other Ambulatory Visit: Payer: Self-pay

## 2022-08-04 ENCOUNTER — Emergency Department (HOSPITAL_BASED_OUTPATIENT_CLINIC_OR_DEPARTMENT_OTHER)
Admission: EM | Admit: 2022-08-04 | Discharge: 2022-08-04 | Disposition: A | Discharge status: Other Acute Inpatient Hospital | Payer: BC Managed Care – PPO | Attending: Emergency Medicine | Admitting: Emergency Medicine

## 2022-08-04 DIAGNOSIS — H5712 Ocular pain, left eye: Secondary | ICD-10-CM | POA: Insufficient documentation

## 2022-08-04 DIAGNOSIS — Z91040 Latex allergy status: Secondary | ICD-10-CM | POA: Insufficient documentation

## 2022-08-04 MED ORDER — TETRACAINE HCL 0.5 % OP SOLN
2.0000 [drp] | Freq: Once | OPHTHALMIC | Status: AC
  Administered 2022-08-04: 2 [drp] via OPHTHALMIC
  Filled 2022-08-04: qty 4

## 2022-08-04 MED ORDER — FLUORESCEIN SODIUM 1 MG OP STRP
1.0000 | ORAL_STRIP | Freq: Once | OPHTHALMIC | Status: AC
  Administered 2022-08-04: 1 via OPHTHALMIC
  Filled 2022-08-04: qty 1

## 2022-08-04 NOTE — ED Provider Notes (Signed)
[place] EMERGENCY DEPARTMENT AT [facility] Provider Note   CSN: [id number] Arrival date & time: 08/04/22  1328     History  Chief Complaint  Patient presents with   Eye Problem    Renee [name] is a 47 y.o. female.  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  She states that she followed up with her ophthalmologist who performed the surgery in April and was told that she had rejected her transplant.  She then never followed back up again as she states she was having difficulty with transportation.  She states that she has never been able to see out of her eye since her transplant and has had pain since but it has been unchanged since he left the hospital following her transplant.  She states that over the past few days it is gone to the point that she cannot close her eyelid over her eye and she presents here for evaluation.  She initially states she was unable to go to [facility] because she does not have transportation, however upon my initial evaluation of the patient she states that she just spoke with her sister who is willing to take her to [place].  Patient denies any changes in her condition in the last few weeks.  She is able to move her eye without pain.  Denies any problems with the right eye.  The history is provided by the patient. No language interpreter was used.  Eye Problem      Home Medications Prior to Admission medications   Medication Sig Start Date End Date Taking? Authorizing Provider  fluticasone (FLONASE) 50 MCG/ACT nasal spray Place 2 sprays into both nostrils daily as needed for allergies. 08/19/21   Glade Lloyd, MD  folic acid (FOLVITE) 1 MG tablet Take 1 tablet (1 mg total) by mouth daily. 08/20/21   Glade Lloyd, MD  levothyroxine (SYNTHROID) 200 MCG tablet TAKE 1 TABLET(200 MCG) BY MOUTH DAILY BEFORE BREAKFAST Patient taking differently: Take 200 mcg by mouth daily  before breakfast. 08/17/19   Lezlie Lye, Meda Coffee, MD  montelukast (SINGULAIR) 10 MG tablet Take 1 tablet (10 mg total) by mouth at bedtime. Patient taking differently: Take 10 mg by mouth daily as needed (for allergires). 01/21/20   Just, Azalee Course, FNP  Multiple Vitamin (MULTI-VITAMIN) tablet Take by mouth.    [provider]  omeprazole (PRILOSEC) 40 MG capsule Take 1 capsule (40 mg total) by mouth 2 (two) times daily before a meal.  **not covered by insurance** 08/19/21 09/18/21  Glade Lloyd, MD  ondansetron (ZOFRAN-ODT) 8 MG disintegrating tablet Take 1 tablet (8 mg total) by mouth 3 (three) times daily as needed. 08/21/20   Anabel Halon, MD  thiamine (VITAMIN B1) 100 MG tablet Take 1 tablet (100 mg total) by mouth daily. 08/20/21   Glade Lloyd, MD      Allergies    Morphine and codeine, Penicillins, Diphenhydramine, Latex, Synthroid [levothyroxine], and Allopurinol    Review of Systems   Review of Systems  Eyes:  Positive for pain.  All other systems reviewed and are negative.   Physical Exam Updated Vital Signs BP 98/75   Pulse (!) 56   Temp 97.8 F (36.6 C)   Resp 18   Ht 5\' 7"  (1.702 m)   Wt 70.3 kg   SpO2 97%   BMI 24.28 kg/m  Physical Exam Vitals and nursing note reviewed.  Constitutional:  General: She is not in acute distress.    Appearance: Normal appearance. She is normal weight. She is not ill-appearing, toxic-appearing or diaphoretic.  HENT:     Head: Normocephalic and atraumatic.  Eyes:     Comments: Patient unable to close her lid over her eye completely. EOMs intact without pain. No periorbital swelling or drainage.  See images below for further  Cardiovascular:     Rate and Rhythm: Normal rate.  Pulmonary:     Effort: Pulmonary effort is normal. No respiratory distress.  Musculoskeletal:        General: Normal range of motion.     Cervical back: Normal range of motion.  Skin:    General: Skin is warm and dry.  Neurological:      General: No focal deficit present.     Mental Status: She is alert.  Psychiatric:        Mood and Affect: Mood normal.        Behavior: Behavior normal.     ED Results / Procedures / Treatments   Labs (all labs ordered are listed, but only abnormal results are displayed) Labs Reviewed - No data to display  EKG None  Radiology No results found.  Procedures Procedures    Medications Ordered in ED Medications  fluorescein ophthalmic strip 1 strip (1 strip Left Eye Given 08/04/22 1657)  tetracaine (PONTOCAINE) 0.5 % ophthalmic solution 2 drop (2 drops Left Eye Given 08/04/22 1657)    ED Course/ Medical Decision Making/ A&P                             Medical Decision Making Risk Prescription drug management.   Patient presents today with complaints of left eye pain for several months.  She is afebrile, nontoxic-appearing, and in no acute distress with reassuring vital signs.  No signs of superimposed infection or orbital cellulitis.  She does not have any vision in the eye but states that this is unchanged since February.  She is requesting to be discharged to go to [facility] where she had her surgery.  I have discussed the patient's case with Dr. Beola Cord who is the ophthalmologist on-call at [facility] who accepts the patient for transfer.  I have emphasized with the patient the importance of going there for close follow-up. She will go POV with her sister who is driving. Evaluation and diagnostic testing in the emergency department does not suggest an emergent condition requiring admission or immediate intervention beyond what has been performed at this time.  Plan for discharge with to [facility]. EMTALA has been completed.  Patient is understanding and amenable with plan, educated on red flag symptoms that would prompt immediate return.  Patient discharged in stable condition.  Final Clinical Impression(s) / ED Diagnoses Final diagnoses:  Left eye pain    Rx / DC Orders ED  Discharge Orders     None         Vear Clock 08/04/22 1900    Cathren Laine, MD 08/04/22 [phone]

## 2022-08-04 NOTE — Discharge Instructions (Signed)
Go straight to [facility] for evaluation. I spoke to the ophthalmologist on-call Dr. Frances Nickels and they are expecting you.

## 2022-08-04 NOTE — ED Triage Notes (Signed)
Pt arrives to ED with c/o left eye protrusion. Pt notes left eye corneal transplant from [facility]. Pt notes left eye pain on-going since procedure.

## 2022-08-04 NOTE — ED Notes (Signed)
[employer] [facility] at bedside

## 2022-08-04 NOTE — ED Notes (Signed)
[facility] transfer line called for Dr Oliver Barre ophthalmology

## 2022-11-02 NOTE — Discharge Summary (Signed)
 [facility] Medicine Discharge Summary  Admit Date: 10/27/2022 Discharge Date: 11/02/2022  9:57 AM  Admitting Physician: Rankin Sings, MD Discharge Physician: Netta Merilee Collet, MD  Primary Care Provider: Roseann Denise Ned, MD, Phone [phone]  Discharge Destination: Home  Admission Diagnoses:  Elevated LFTs [R79.89] Eye problem [H57.9] Acute left eye pain [H57.12] Vitamin A  deficiency [E50.9]  Discharge Diagnoses:  Principal Problem (Resolved):   Left eye pain Active Problems:   Anxiety   GERD (gastroesophageal reflux disease)   Adrenal insufficiency (Addison's disease) (CMS/HHS-HCC)  Primary Diagnosis: 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   Changes Made (with rationale):  STARTED doxycycline  100mg  BID x 7 days STARTED erythromycin  ointment to L eye TID STARTED Vitamin B12 1000mcg daily STARTED Vitamin A  100,000 IU IM to be taken every other day for 3 doses, followed by oral vitamin A  daily thereafter STARTED oxycodone  prn for post-surgical pain STARTED Hydrocortisone  10mg  qAM, 5mg  Qpm STARTED Iron sulfate 325mg  daily STARTED Zinc  220mg  daily INCREASED Copper  2mg  TID for 1 month, followed by daily thereafter   To-Do List (incidental findings, follow-up studies, etc.): Pt to follow up with ophthalmology for surveillance of left eye and abdominal incision 10/10 Follow up with endocrinology for surveillance of hypothyroidism and adrenal insufficiency PCP to refer to local psychiatry for management of underlying bipolar 1 disorder Return to bariatric surgeon for monitoring of nutritional deficiencies associated with gastric bypass  Anticipatory Guidance for Outpatient Care:  Admitted for planned enucleation of left eye.  Found to have numerous metabolic and vitamin deficiencies associated with gastric bypass and endocrinopathies.  Treated with high-dose IM Vitamin A  and discharged on numerous vitamin  supplementations, thyroid  supplementation and started on steroids for adrenal insufficiency.      Results Pending at Discharge:  Unresulted Labs (From admission, onward)     Start     Ordered   10/29/22 0714  Hepatic Function Panel (HFP)  Once,   STAT       Question:  Release to patient  Answer:  Immediate   10/29/22 9286           Please see phone numbers at end of this summary for lab contact information.   Follow-up/Care Transition Plan: Sched. appts: Future Appointments  Date Time Provider Department Center  11/03/2022  3:00 PM Dermarkarian, Lonni [place], MD [facility]    Follow-up info: Roseann Denise Ned, MD [address] [facility] [place] [address] [phone]  Follow up in 1 week(s)       Allergies/Intolerances:  Allergies  Allergen Reactions  . Codeine Itching  . Morphine Itching and Unknown  . Penicillins Rash and Unknown     New Adverse Drug Events: none  Medications:   Current Discharge Medication List     START taking these medications   Details  cyanocobalamin  (VITAMIN B12) 1000 MCG tablet Take 1 tablet (1,000 mcg total) by mouth once daily Qty: 30 tablet, Refills: 0    doxycycline  monohydrate 100 mg Tab tablet Take 1 tablet (100 mg total) by mouth every 12 (twelve) hours for 7 days Qty: 14 tablet, Refills: 0    hydrocortisone  (CORTEF ) 5 MG tablet Take 2 tablets (10 mg total) by mouth once daily AND 1 tablet (5 mg total) daily with dinner. Do all this for 30 days. Double dose for sick day dosing. Qty: 100 tablet, Refills: 0    oxyCODONE  (ROXICODONE ) 5 MG immediate release tablet Take 1 tablet (5 mg total)  by mouth every 4 (four) hours as needed for up to 5 days Qty: 20 tablet, Refills: 0    vitamin A  palmitate (AQUASOL A ) 50,000 unit/mL injection Inject 1 mL (50,000 Units total) into the muscle once daily for 14 days Qty: 6 mL, Refills: 0    carboxymethylcellulose (REFRESH TEARS)  0.5 % ophthalmic solution Place 1-2 drops into the right eye as needed for Dry Eyes Qty: 15 mL, Refills: 0    ferrous sulfate 325 (65 FE) MG tablet Take 1 tablet (325 mg total) by mouth daily with breakfast for 30 days Qty: 30 tablet, Refills: 0    syringe with needle (BD LUER-LOK SYRINGE) 3 mL 25 gauge x 1 Syrg Use once daily for injection Qty: 25 each, Refills: 0    syringe, disposable, 1 mL syringe Use as directed for up to 14 days Qty: 25 each, Refills: 0    zinc  sulfate (ZINC -220) 50 mg zinc  (220 mg) capsule Take 1 capsule (220 mg total) by mouth once daily       CONTINUE these medications which have CHANGED   Details  ascorbic acid, vitamin C, (VITAMIN C) 500 MG tablet Take 2 tablets (1,000 mg total) by mouth once daily for 30 days Qty: 60 tablet, Refills: 0    cholecalciferol (VITAMIN D3) 1,250 mcg (50,000 unit) capsule Take 1 capsule (50,000 Units total) by mouth once a week Qty: 8 capsule, Refills: 0    copper  gluconate 2 mg tablet Take 1 tablet (2 mg total) by mouth 3 (three) times a day Take 3 times daily for 1 month, then daily thereafter    erythromycin  (ROMYCIN) ophthalmic ointment Place into the left eye 3 (three) times daily for 7 days Qty: 3.5 g, Refills: 0    folic acid  (FOLVITE ) 1 MG tablet Take 1 tablet (1 mg total) by mouth once daily Qty: 30 tablet, Refills: 0    levothyroxine  (SYNTHROID ) 200 MCG tablet Take 1 tablet (200 mcg total) by mouth once daily for 30 days Take 7 tablets once weekly for hypothyroidism. Qty: 30 tablet, Refills: 0    multivitamin tablet Take 1 tablet by mouth once daily for 30 days Qty: 30 tablet, Refills: 0    omeprazole  (PRILOSEC) 20 MG DR capsule Take 1 capsule (20 mg total) by mouth once daily for 30 days Qty: 30 capsule, Refills: 0    ondansetron  (ZOFRAN -ODT) 8 MG disintegrating tablet Dissolve 1 tablet (8 mg total) in mouth every 8 (eight) hours as needed for Nausea Qty: 20 tablet, Refills: 0    selenium 100 mcg Tab Take  1 tablet (100 mcg total) by mouth once daily Qty: 30 tablet, Refills: 0    thiamine  (VITAMIN B-1) 100 MG tablet Take 1 tablet (100 mg total) by mouth once daily for 30 days Qty: 30 tablet, Refills: 0    vitamin A  10000 UNIT capsule Take 2 capsules (20,000 Units total) by mouth once daily Take following completion of IM injections Qty: 60 capsule, Refills: 0       CONTINUE these medications which have NOT CHANGED   Details  cetirizine  (ZYRTEC ) 1 mg/mL oral solution Take 5 mg by mouth once daily    dextroamphetamine -amphetamine  (ADDERALL) 30 mg tablet Take 30 mg by mouth 2 (two) times daily    melatonin 3 mg tablet Take 2 tablets (6 mg total) by mouth at bedtime for 30 days Qty: 60 tablet, Refills: 0       STOP taking these medications     dorzolamide-timoloL (COSOPT)  22.3-6.8 mg/mL ophthalmic solution      brimonidine (ALPHAGAN) 0.2 % ophthalmic solution      Compound Medication      latanoprost (XALATAN) 0.005 % ophthalmic solution      moxifloxacin (VIGAMOX) 0.5 % ophthalmic solution          Brief History of Present Illness:  Renee [name] is a 47 y.o. 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   Ms. 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 She was started on synthroid  as well as hydrocortisone . Unfortunately she got lost to f/u and has not been able to take any of her medications for these conditions. She informs me that she has experienced significant financial difficulties and no longer has insurance. Her family lives on the cost and she is living alone.   She was also found to have multiple vitamin deficiencies including vitamin A  that likely led to L eye  corneal melt requiring a transplant that admission.She was d/c'd on multiple eye drops and has been following with ophthalmology. Unfortunately she has been having worsening pain in L eye and complete vision loss. She was seen in clinic and recommended admission for enucleation of L eye given impending perforation as well as medical optimization.   She otherwise denies any fevers, chills, night sweats, n/v/d. She has been feeling more unsteady on her feet. No cp, sob, palpitations, decrease in ability to move due to fatigue. _____________________   Hospital Course by Problem:  #L eye corneal transplant c/b perforation Presented with plans for L eye enucleation given c/f possible impending perforation in setting of suspected corneal failure related to vitamin A  deficiency.  Was found by ophtho to have small perforation on surveillance exam 10/5 and underwent enucleation by ophthalmology on 10/6.  She was started on doxycycline  100mg  BID x 10 days and recommend erythromycin  ointment TID to lid line on lett.  Eye shield provided to aid in protection.  Pt to f/u with ophthalmology on 10/10 for ongoing surveillance.     #Multiple vitamin deficiencies in setting of bariatric surgery, including vitamin A , zinc , copper  and vitamin D . 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 She also has been unable to obtain IM injections as outpatient as well as po supplementation regularly due to access and cost. Cause of current eye issue was felt to be particularly related to vitamin A  deficiency.  She was given high-dose Vitamin A  100,000IU IM x 3 doses.  At time of discharge, [facility] was able to sponsor 3 additional doses of 100,000IU IM vitamin A  to be taken QOD for 3 days, followed by oral vitamin A  supplementation daily thereafter.   - Continue thiamine , mvi (super thera vite). - Copper  level remains subtherapeutic 28, recommended Copper  gluconate 2mg  TID x 1 mo, followed by  daily thereafter - zinc  level returned subtherapeutic - started on zinc  220mg  PO daily - She was maintained on prior vitamins including thiamine , Vitamin D , selenium, vitamin B12, folic acid , vitamin C and MVI - Advised to follow up with primary bariatric surgeon for ongoing surveillance and monitoring of vitamin deficiencies.      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  Largely nonadherent at home due to cost and  access.  Last d/c'd on 1400mcg weekly. Last fill of 200 mcg daily on 9/23 and had been taking regularly preceding hospitalization.  Thyroid  panel on admission notable for TSH 64.28, though with FT4 wnl at 1.01.  She was evaluated by endocrinology, given 100mcg IV levothyroxine  prior to surgery and resumed on home regimen of 200mcg daily with repeat FT4 1.13 on 10/7.  Pt to f/u with endocrinology in 3-4 weeks for ongoing surveillance.      #Adrenal Insufficiency Concern for concurrent AI during last admission. Did not undergo confirmatory testing as outpatient.  Had not been taking steroids since discharge.  Repeat cortisol remained low at 3.4, pt was subsequently resumed on [facility] replacement therapy and weaned to maintenance dosing of 10/5mg  following wean of stress steroids after surgery.  Has f/u with endocrinology for ongoing management.      #Elevated Liver Enzymes, downtrending  Elevated AST 205, ALT 139 on admisison. Has hx of EtOH induced hepatitis. She denied ongoing etoh use however on admission.  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  - Acute hepatitis panel negative.  - Liver US  unremarkable.  - PeTH not obtained this admission, though could be considered if LFT's noted to rise on subsequent presentation   # Moderate pericardial effusion on last admission Identified on TTE in 02/2022.  In setting of severe hypothyroidism. No signs of volume overload and denies any dyspnea or chest pain.   - Needs repeat TTE when  clinically appropriate   #Bipolar Disorder #Hx of amphetamine  misuse Has previously been on latuda. She does not appear to be acutely manic or w/sx of severe depression such as SI/HI/AVH on my evaluation, however this remains untreated and is at high risk of decompensation and is already playing a significant role in her medical care. Recommend local referral to psychiatry for ongoing management and surveillance.  #Macrocytic anemia #thrombocytopenia Likely due to nutritional deficiency and decreased bone marrow production.  - iron studies wnl - Continue supplementation as above.       Surgeries and Procedures Performed:   Procedure(s): GRAFT; DERMA-FAT-FASCIA; ORBIT TEMPORARY CLOSURE OF EYELIDS BY SUTURE (EG, FROST SUTURE) ENUCLEATION OF EYE; WITHOUT IMPLANT CONJUNCTIVOPLASTY; WITH CONJUNCTIVAL GRAFT OR EXTENSIVE REARRANGEMENT _____________________  Discharge Exam:  BP 120/88 (BP Location: Right upper arm, Patient Position: Sitting)   Pulse 82   Temp 36.9 C (98.4 F) (Oral)   Resp 18   SpO2 97%  O2 Device: Nasal cannula (10/30/22 1215)  General: alert, cooperative, in NAD, oriented x4 Eyes: right eye: conjunctivae clear, EOMs intact.  Left eye s/p enucleation, sealed with patch in place, surrounding ecchymosis HENT: oropharynx clear, moist mucous membranes Neck:no adenopathy, no JVD, supple, symmetrical, trachea midline, and no thyromegally CV: regular rate and rhythm, without murmurs, rubs or gallops Resp: clear to auscultation, good air exchange Jai:dnqu, nontender, nondistended, normoactive bowel sounds , ventral incision site c/d/i Ext: no lower extremity edema Skin: no rashes or lesions Psych: oriented to time, place and person, mood and affect are approprate Neuro: CN II-XII intact Grossly normal and symmetric strength in upper and lower extremities Intact sensation to light touch throughout normal coordiation  Pertinent Lab Testing: Recent Labs  Lab  10/30/22 1337 10/31/22 0302 11/01/22 0349  NA 139 140 141  K 4.0 3.4* 3.5  CL 112* 115* 113*  CO2 18* 23 23  BUN 15 12 12   CREATININE 0.6 0.8 0.6  GLUCOSE 135 110 75  CALCIUM 7.9* 8.0* 7.7*   Recent Labs  Lab  10/29/22 1723 10/30/22 1337 10/31/22 0302  AST 40 32 22  ALT 64* 56* 43*  ALKPHOS 77 81 75  TBILI 1.0 1.0 0.6    Recent Labs  Lab 10/30/22 1337 10/31/22 0302 11/01/22 0349  WBC 8.3 10.4* 10.4*  HGB 10.4* 9.4* 9.2*  HCT 32.9* 28.7* 29.5*  PLT 126* 125* 142*   Recent Labs  Lab 10/27/22 1849 10/28/22 0627  APTT 36.7  --   INR 1.1 1.1     Other Pertinent Labs:   Am Cortisol 3.4  Vitamin A  <2.3 Zinc  25 Copper  28 Vitamin K <0.10 25, OH Vitamin D  6 Folate 7.6 Vitamin B12 323 Vitamin B1 113.6  _____________________  Code Status: Full Code   Status on Discharge:  Current activity: Walks occasionally (11/01/22 1945) Current mobility: No limitation (11/01/22 1945)  Activity Recommendation: activity as tolerated  Other Discharge Instructions: Services setup at discharge: None, Rolling Walker provided Tubes/lines at discharge: None  Diet: Oral Supplements - Adult All Supplements; Ensure Plus High Protein-Strawberry; With Meals Diet regular Wound Care Order Instructions     None       _____________________  Time spent on discharge process: 45 minutes    Merilee Gerome Ka, MD [facility]  11/02/2022   Hospital Contact Information:  [facility]) [facility]) [facility])  Pending tests:  Laboratory: [phone] Microbiology: [phone] Pathology: [phone] Radiology: [phone]  General questions: [phone] Pending tests: Laboratory: [phone] Microbiology: [phone] Pathology: [phone] Radiology: [phone]  General questions:  [phone] Pending tests:  Laboratory: [phone] Microbiology: [phone] Pathology: [phone] Radiology: [phone]  General questions:  [phone]

## 2022-11-07 NOTE — Progress Notes (Signed)
 Pathology (10/30/22) A.  Left eye, enucleation: 1. Cornea with keratomalacia, a thick layer of keratinizing epidermalized squamous epithelium; focal intraepithelial abscess; absent Bowman's layer; chronic keratitis; vascularization and severe diffuse scarring of the stroma; a multinucleated giant cell reaction to suture; and, disrupted Descemet's membrane and adherent retrocorneal inflamed fibrovascular tissue. Conjunctiva with epidermidalization, keratinization, and chronic inflammation. These findings in the cornea and conjunctiva are consistent with vitamin A  deficiency; 2. Iris with severe atrophy; chronic inflammation; and, anterior and posterior synechiae;  3. Blockage of the anterior chamber angle; 4. Lens with nuclear sclerosis; and, 5. Retina with a decreased number of ganglion cells.  #Status post Graft; Derma-fat-fascia; Orbit - Left, Temporary Closure Of Eyelids By Suture (eg, Frost Suture) - Left, Enucleation Of Eye; Without Implant - Left, and Conjunctivoplasty; With Conjunctival Graft Or Extensive Rearrangement - Left on 10/30/2022 for blind, painful LEFT eye  - Patient without complaints. Denies any eye pain OS or stomach/GI issues. States she is doing well and is happy with results of surgery.  - Photos taken and uploaded to media tab on 11/08/22 - Pathology as above. Sutures removed on 11/08/22 OS. Conformer in place OS. Wounds C/D/I OS. After removal of conformer from LEFT eye, there was poor wound healing of the dermis fat graft seen medially with poor orbital fat volume; no obvious signs of infection or exposure. I suspect the patient's multiple endocrine and vitamin abnormalities have played a role in her poor wound healing. I will continue to observe for now to see how the socket heals and if the volume is adequate. The patient knows to return in 3 months to see if placement of a prosthetic is viable.  - Vitamin A  supplementation as per Cornea - Patient states she finished IM  Vitamin A . Now on PO vitamin A  supplementation - Needs to follow-up with Endocrine. Has appointment on 12/09/22 - Case discussed briefly with Dr. Trudy.  - Continue to follow written postoperative instructions form given both pre-op and on day of surgery. - Dispose of unused medications with approved methods (see packaging or consult  pharmacist).  - Apply erythromycin  ointment TID to LEFT eye socket for 3 weeks.   Post-op risks were discussed with the patient and include, but are not limited to: need for reoperation, bleeding, infection, pain, decreased or loss of vision, double vision, tearing of the eye, bruising or swelling around the eye, facial/palatal/lip numbness, poor cosmetic outcome, and death. All questions were answered. Strict return precautions given.   RTC 3-4 months  Christopher Dermarkarian, MD Oculoplastics and Orbital Surgery

## 2023-11-25 ENCOUNTER — Inpatient Hospital Stay (HOSPITAL_COMMUNITY)
Admission: EM | Admit: 2023-11-25 | Discharge: 2023-12-11 | DRG: 628 | Disposition: A | Discharge status: Rehabilitation Facility | Attending: Internal Medicine | Admitting: Internal Medicine

## 2023-11-25 ENCOUNTER — Other Ambulatory Visit: Payer: Self-pay

## 2023-11-25 ENCOUNTER — Encounter (HOSPITAL_COMMUNITY): Payer: Self-pay | Admitting: Emergency Medicine

## 2023-11-25 DIAGNOSIS — E039 Hypothyroidism, unspecified: Secondary | ICD-10-CM | POA: Diagnosis present

## 2023-11-25 DIAGNOSIS — K55019 Acute (reversible) ischemia of small intestine, extent unspecified: Secondary | ICD-10-CM | POA: Diagnosis not present

## 2023-11-25 DIAGNOSIS — G894 Chronic pain syndrome: Secondary | ICD-10-CM | POA: Diagnosis present

## 2023-11-25 DIAGNOSIS — E870 Hyperosmolality and hypernatremia: Secondary | ICD-10-CM | POA: Diagnosis present

## 2023-11-25 DIAGNOSIS — Z87891 Personal history of nicotine dependence: Secondary | ICD-10-CM

## 2023-11-25 DIAGNOSIS — R579 Shock, unspecified: Secondary | ICD-10-CM | POA: Diagnosis not present

## 2023-11-25 DIAGNOSIS — D62 Acute posthemorrhagic anemia: Secondary | ICD-10-CM | POA: Diagnosis not present

## 2023-11-25 DIAGNOSIS — K55059 Acute (reversible) ischemia of intestine, part and extent unspecified: Secondary | ICD-10-CM | POA: Diagnosis not present

## 2023-11-25 DIAGNOSIS — K55029 Acute infarction of small intestine, extent unspecified: Secondary | ICD-10-CM | POA: Diagnosis not present

## 2023-11-25 DIAGNOSIS — F332 Major depressive disorder, recurrent severe without psychotic features: Secondary | ICD-10-CM | POA: Diagnosis present

## 2023-11-25 DIAGNOSIS — E162 Hypoglycemia, unspecified: Secondary | ICD-10-CM | POA: Diagnosis not present

## 2023-11-25 DIAGNOSIS — E2740 Unspecified adrenocortical insufficiency: Secondary | ICD-10-CM | POA: Diagnosis present

## 2023-11-25 DIAGNOSIS — K912 Postsurgical malabsorption, not elsewhere classified: Secondary | ICD-10-CM | POA: Diagnosis not present

## 2023-11-25 DIAGNOSIS — J9601 Acute respiratory failure with hypoxia: Secondary | ICD-10-CM | POA: Diagnosis not present

## 2023-11-25 DIAGNOSIS — M797 Fibromyalgia: Secondary | ICD-10-CM | POA: Diagnosis present

## 2023-11-25 DIAGNOSIS — F3131 Bipolar disorder, current episode depressed, mild: Secondary | ICD-10-CM | POA: Diagnosis not present

## 2023-11-25 DIAGNOSIS — F3130 Bipolar disorder, current episode depressed, mild or moderate severity, unspecified: Secondary | ICD-10-CM | POA: Diagnosis not present

## 2023-11-25 DIAGNOSIS — R9431 Abnormal electrocardiogram [ECG] [EKG]: Secondary | ICD-10-CM | POA: Diagnosis present

## 2023-11-25 DIAGNOSIS — E785 Hyperlipidemia, unspecified: Secondary | ICD-10-CM | POA: Diagnosis present

## 2023-11-25 DIAGNOSIS — F3175 Bipolar disorder, in partial remission, most recent episode depressed: Secondary | ICD-10-CM | POA: Diagnosis present

## 2023-11-25 DIAGNOSIS — D696 Thrombocytopenia, unspecified: Secondary | ICD-10-CM | POA: Diagnosis not present

## 2023-11-25 DIAGNOSIS — Z91148 Patient's other noncompliance with medication regimen for other reason: Secondary | ICD-10-CM

## 2023-11-25 DIAGNOSIS — F4310 Post-traumatic stress disorder, unspecified: Secondary | ICD-10-CM | POA: Diagnosis present

## 2023-11-25 DIAGNOSIS — Q438 Other specified congenital malformations of intestine: Secondary | ICD-10-CM

## 2023-11-25 DIAGNOSIS — R001 Bradycardia, unspecified: Secondary | ICD-10-CM | POA: Diagnosis not present

## 2023-11-25 DIAGNOSIS — R4781 Slurred speech: Secondary | ICD-10-CM | POA: Diagnosis present

## 2023-11-25 DIAGNOSIS — R748 Abnormal levels of other serum enzymes: Secondary | ICD-10-CM | POA: Diagnosis present

## 2023-11-25 DIAGNOSIS — Z818 Family history of other mental and behavioral disorders: Secondary | ICD-10-CM

## 2023-11-25 DIAGNOSIS — R578 Other shock: Secondary | ICD-10-CM | POA: Diagnosis not present

## 2023-11-25 DIAGNOSIS — E872 Acidosis, unspecified: Secondary | ICD-10-CM | POA: Diagnosis present

## 2023-11-25 DIAGNOSIS — Z5331 Laparoscopic surgical procedure converted to open procedure: Secondary | ICD-10-CM

## 2023-11-25 DIAGNOSIS — Z91040 Latex allergy status: Secondary | ICD-10-CM

## 2023-11-25 DIAGNOSIS — Z6832 Body mass index (BMI) 32.0-32.9, adult: Secondary | ICD-10-CM | POA: Diagnosis not present

## 2023-11-25 DIAGNOSIS — F411 Generalized anxiety disorder: Secondary | ICD-10-CM | POA: Diagnosis present

## 2023-11-25 DIAGNOSIS — K9419 Other complications of enterostomy: Secondary | ICD-10-CM | POA: Diagnosis not present

## 2023-11-25 DIAGNOSIS — Z813 Family history of other psychoactive substance abuse and dependence: Secondary | ICD-10-CM

## 2023-11-25 DIAGNOSIS — Z811 Family history of alcohol abuse and dependence: Secondary | ICD-10-CM

## 2023-11-25 DIAGNOSIS — F909 Attention-deficit hyperactivity disorder, unspecified type: Secondary | ICD-10-CM | POA: Diagnosis present

## 2023-11-25 DIAGNOSIS — K469 Unspecified abdominal hernia without obstruction or gangrene: Secondary | ICD-10-CM | POA: Diagnosis not present

## 2023-11-25 DIAGNOSIS — Z833 Family history of diabetes mellitus: Secondary | ICD-10-CM

## 2023-11-25 DIAGNOSIS — K559 Vascular disorder of intestine, unspecified: Secondary | ICD-10-CM | POA: Diagnosis not present

## 2023-11-25 DIAGNOSIS — I10 Essential (primary) hypertension: Secondary | ICD-10-CM | POA: Diagnosis present

## 2023-11-25 DIAGNOSIS — E876 Hypokalemia: Secondary | ICD-10-CM | POA: Diagnosis present

## 2023-11-25 DIAGNOSIS — E440 Moderate protein-calorie malnutrition: Secondary | ICD-10-CM | POA: Diagnosis present

## 2023-11-25 DIAGNOSIS — Z79899 Other long term (current) drug therapy: Secondary | ICD-10-CM

## 2023-11-25 DIAGNOSIS — D72829 Elevated white blood cell count, unspecified: Secondary | ICD-10-CM | POA: Diagnosis present

## 2023-11-25 DIAGNOSIS — K6389 Other specified diseases of intestine: Secondary | ICD-10-CM | POA: Diagnosis present

## 2023-11-25 DIAGNOSIS — R109 Unspecified abdominal pain: Secondary | ICD-10-CM | POA: Diagnosis not present

## 2023-11-25 DIAGNOSIS — R197 Diarrhea, unspecified: Secondary | ICD-10-CM | POA: Diagnosis not present

## 2023-11-25 DIAGNOSIS — Z8249 Family history of ischemic heart disease and other diseases of the circulatory system: Secondary | ICD-10-CM

## 2023-11-25 DIAGNOSIS — M1A9XX0 Chronic gout, unspecified, without tophus (tophi): Secondary | ICD-10-CM | POA: Diagnosis present

## 2023-11-25 DIAGNOSIS — R5381 Other malaise: Secondary | ICD-10-CM | POA: Diagnosis not present

## 2023-11-25 DIAGNOSIS — K9413 Enterostomy malfunction: Secondary | ICD-10-CM | POA: Diagnosis not present

## 2023-11-25 DIAGNOSIS — E663 Overweight: Secondary | ICD-10-CM | POA: Diagnosis present

## 2023-11-25 DIAGNOSIS — G4733 Obstructive sleep apnea (adult) (pediatric): Secondary | ICD-10-CM | POA: Diagnosis present

## 2023-11-25 DIAGNOSIS — K5900 Constipation, unspecified: Secondary | ICD-10-CM | POA: Diagnosis not present

## 2023-11-25 DIAGNOSIS — Z79890 Hormone replacement therapy (postmenopausal): Secondary | ICD-10-CM

## 2023-11-25 DIAGNOSIS — K660 Peritoneal adhesions (postprocedural) (postinfection): Secondary | ICD-10-CM | POA: Diagnosis present

## 2023-11-25 DIAGNOSIS — D649 Anemia, unspecified: Secondary | ICD-10-CM | POA: Diagnosis not present

## 2023-11-25 DIAGNOSIS — Z888 Allergy status to other drugs, medicaments and biological substances status: Secondary | ICD-10-CM

## 2023-11-25 DIAGNOSIS — Z9884 Bariatric surgery status: Secondary | ICD-10-CM

## 2023-11-25 DIAGNOSIS — Z885 Allergy status to narcotic agent status: Secondary | ICD-10-CM

## 2023-11-25 DIAGNOSIS — Z880 Allergy status to penicillin: Secondary | ICD-10-CM

## 2023-11-25 DIAGNOSIS — Y838 Other surgical procedures as the cause of abnormal reaction of the patient, or of later complication, without mention of misadventure at the time of the procedure: Secondary | ICD-10-CM | POA: Diagnosis not present

## 2023-11-25 DIAGNOSIS — Z8261 Family history of arthritis: Secondary | ICD-10-CM

## 2023-11-25 LAB — COMPREHENSIVE METABOLIC PANEL WITH GFR
ALT: 43 U/L (ref 0–44)
ALT: 36 U/L (ref 0–44)
ALT: 46 U/L — ABNORMAL HIGH (ref 0–44)
ALT: 77 U/L — ABNORMAL HIGH (ref 0–44)
AST: 87 U/L — ABNORMAL HIGH (ref 15–41)
AST: 70 U/L — ABNORMAL HIGH (ref 15–41)
AST: 151 U/L — ABNORMAL HIGH (ref 15–41)
AST: 296 U/L — ABNORMAL HIGH (ref 15–41)
Albumin: 3.8 g/dL (ref 3.5–5.0)
Albumin: 3.4 g/dL — ABNORMAL LOW (ref 3.5–5.0)
Albumin: 3 g/dL — ABNORMAL LOW (ref 3.5–5.0)
Albumin: 3.4 g/dL — ABNORMAL LOW (ref 3.5–5.0)
Alkaline Phosphatase: 233 U/L — ABNORMAL HIGH (ref 38–126)
Alkaline Phosphatase: 206 U/L — ABNORMAL HIGH (ref 38–126)
Alkaline Phosphatase: 189 U/L — ABNORMAL HIGH (ref 38–126)
Alkaline Phosphatase: 240 U/L — ABNORMAL HIGH (ref 38–126)
Anion gap: 11 (ref 5–15)
Anion gap: 11 (ref 5–15)
Anion gap: 8 (ref 5–15)
Anion gap: 9 (ref 5–15)
BUN: 7 mg/dL (ref 6–20)
BUN: 7 mg/dL (ref 6–20)
BUN: 6 mg/dL (ref 6–20)
BUN: 7 mg/dL (ref 6–20)
CO2: 20 mmol/L — ABNORMAL LOW (ref 22–32)
CO2: 19 mmol/L — ABNORMAL LOW (ref 22–32)
CO2: 19 mmol/L — ABNORMAL LOW (ref 22–32)
CO2: 21 mmol/L — ABNORMAL LOW (ref 22–32)
Calcium: 5.1 mg/dL — CL (ref 8.9–10.3)
Calcium: 4.7 mg/dL — CL (ref 8.9–10.3)
Calcium: 4.6 mg/dL — CL (ref 8.9–10.3)
Calcium: 6.5 mg/dL — ABNORMAL LOW (ref 8.9–10.3)
Chloride: 107 mmol/L (ref 98–111)
Chloride: 111 mmol/L (ref 98–111)
Chloride: 114 mmol/L — ABNORMAL HIGH (ref 98–111)
Chloride: 109 mmol/L (ref 98–111)
Creatinine, Ser: 0.75 mg/dL (ref 0.44–1.00)
Creatinine, Ser: 0.66 mg/dL (ref 0.44–1.00)
Creatinine, Ser: 0.62 mg/dL (ref 0.44–1.00)
Creatinine, Ser: 0.7 mg/dL (ref 0.44–1.00)
GFR, Estimated: >60 mL/min (ref >60)
GFR, Estimated: >60 mL/min (ref >60)
GFR, Estimated: >60 mL/min (ref >60)
GFR, Estimated: >60 mL/min (ref >60)
Glucose, Bld: 94 mg/dL (ref 70–99)
Glucose, Bld: 72 mg/dL (ref 70–99)
Glucose, Bld: 97 mg/dL (ref 70–99)
Glucose, Bld: 92 mg/dL (ref 70–99)
Potassium: 2.7 mmol/L — CL (ref 3.5–5.1)
Potassium: 2.8 mmol/L — ABNORMAL LOW (ref 3.5–5.1)
Potassium: 2.2 mmol/L — CL (ref 3.5–5.1)
Potassium: 3.3 mmol/L — ABNORMAL LOW (ref 3.5–5.1)
Sodium: 139 mmol/L (ref 135–145)
Sodium: 141 mmol/L (ref 135–145)
Sodium: 142 mmol/L (ref 135–145)
Sodium: 139 mmol/L (ref 135–145)
Total Bilirubin: 1.2 mg/dL (ref 0.0–1.2)
Total Bilirubin: 1.1 mg/dL (ref 0.0–1.2)
Total Bilirubin: 1 mg/dL (ref 0.0–1.2)
Total Bilirubin: 1.1 mg/dL (ref 0.0–1.2)
Total Protein: 6.2 g/dL — ABNORMAL LOW (ref 6.5–8.1)
Total Protein: 5.6 g/dL — ABNORMAL LOW (ref 6.5–8.1)
Total Protein: 5 g/dL — ABNORMAL LOW (ref 6.5–8.1)
Total Protein: 5.6 g/dL — ABNORMAL LOW (ref 6.5–8.1)

## 2023-11-25 LAB — CBC WITH DIFFERENTIAL/PLATELET
Abs Immature Granulocytes: 0.06 K/uL (ref 0.00–0.07)
Basophils Absolute: 0.1 K/uL (ref 0.0–0.1)
Basophils Relative: 1 %
Eosinophils Absolute: 0.1 K/uL (ref 0.0–0.5)
Eosinophils Relative: 0 %
HCT: 32.7 % — ABNORMAL LOW (ref 36.0–46.0)
Hemoglobin: 11.1 g/dL — ABNORMAL LOW (ref 12.0–15.0)
Immature Granulocytes: 0 %
Lymphocytes Relative: 43 %
Lymphs Abs: 5.8 K/uL — ABNORMAL HIGH (ref 0.7–4.0)
MCH: 29.8 pg (ref 26.0–34.0)
MCHC: 33.9 g/dL (ref 30.0–36.0)
MCV: 87.9 fL (ref 80.0–100.0)
Monocytes Absolute: 0.4 K/uL (ref 0.1–1.0)
Monocytes Relative: 3 %
Neutro Abs: 7 K/uL (ref 1.7–7.7)
Neutrophils Relative %: 53 %
Platelets: 204 K/uL (ref 150–400)
RBC: 3.72 MIL/uL — ABNORMAL LOW (ref 3.87–5.11)
RDW: 19 % — ABNORMAL HIGH (ref 11.5–15.5)
Smear Review: NORMAL
WBC: 13.4 K/uL — ABNORMAL HIGH (ref 4.0–10.5)
nRBC: 0 % (ref 0.0–0.2)

## 2023-11-25 LAB — TSH: TSH: 147 u[IU]/mL — ABNORMAL HIGH (ref 0.350–4.500)

## 2023-11-25 LAB — PROTIME-INR
INR: 1.4 — ABNORMAL HIGH (ref 0.8–1.2)
Prothrombin Time: 18.4 s — ABNORMAL HIGH (ref 11.4–15.2)

## 2023-11-25 LAB — PHOSPHORUS
Phosphorus: <1 mg/dL — CL (ref 2.5–4.6)
Phosphorus: 1.3 mg/dL — ABNORMAL LOW (ref 2.5–4.6)

## 2023-11-25 LAB — MAGNESIUM
Magnesium: 1.1 mg/dL — ABNORMAL LOW (ref 1.7–2.4)
Magnesium: 1.9 mg/dL (ref 1.7–2.4)

## 2023-11-25 LAB — HIV ANTIBODY (ROUTINE TESTING W REFLEX): HIV Screen 4th Generation wRfx: NONREACTIVE

## 2023-11-25 MED ORDER — ENOXAPARIN SODIUM 40 MG/0.4ML IJ SOSY
40.0000 mg | PREFILLED_SYRINGE | INTRAMUSCULAR | Status: DC
  Administered 2023-11-25 – 2023-11-29 (×5): 40 mg via SUBCUTANEOUS
  Filled 2023-11-25 (×5): qty 0.4

## 2023-11-25 MED ORDER — SODIUM CHLORIDE 0.9 % IV SOLN
1.0000 mg/kg/h | INTRAVENOUS | Status: DC
  Administered 2023-11-25: 1 mg/kg/h via INTRAVENOUS
  Filled 2023-11-25 (×3): qty 100

## 2023-11-25 MED ORDER — HYDROMORPHONE HCL 1 MG/ML IJ SOLN
0.5000 mg | Freq: Once | INTRAMUSCULAR | Status: AC
  Administered 2023-11-25: 0.5 mg via INTRAVENOUS
  Filled 2023-11-25: qty 1

## 2023-11-25 MED ORDER — CHLORHEXIDINE GLUCONATE CLOTH 2 % EX PADS
6.0000 | MEDICATED_PAD | Freq: Every day | CUTANEOUS | Status: DC
  Administered 2023-11-25 – 2023-12-06 (×9): 6 via TOPICAL

## 2023-11-25 MED ORDER — POTASSIUM CHLORIDE 10 MEQ/100ML IV SOLN
10.0000 meq | Freq: Once | INTRAVENOUS | Status: AC
  Administered 2023-11-25: 10 meq via INTRAVENOUS

## 2023-11-25 MED ORDER — CALCIUM GLUCONATE-NACL 1-0.675 GM/50ML-% IV SOLN
1.0000 g | Freq: Once | INTRAVENOUS | Status: AC
  Administered 2023-11-25: 1000 mg via INTRAVENOUS
  Filled 2023-11-25: qty 50

## 2023-11-25 MED ORDER — MAGNESIUM SULFATE 4 GM/100ML IV SOLN
4.0000 g | Freq: Once | INTRAVENOUS | Status: AC
Start: 2023-11-25 — End: 2023-11-25
  Administered 2023-11-25: 4 g via INTRAVENOUS
  Filled 2023-11-25: qty 100

## 2023-11-25 MED ORDER — POLYETHYLENE GLYCOL 3350 17 G PO PACK: 17.0000 g | PACK | Freq: Every day | ORAL | Status: DC | PRN

## 2023-11-25 MED ORDER — SODIUM CHLORIDE 0.9 % IV SOLN: Freq: Once | INTRAVENOUS | Status: AC

## 2023-11-25 MED ORDER — POTASSIUM CHLORIDE 10 MEQ/100ML IV SOLN
10.0000 meq | INTRAVENOUS | Status: AC
  Administered 2023-11-25: 10 meq via INTRAVENOUS
  Filled 2023-11-25 (×2): qty 100

## 2023-11-25 MED ORDER — ACETAMINOPHEN 650 MG RE SUPP: 650.0000 mg | Freq: Four times a day (QID) | RECTAL | Status: DC | PRN

## 2023-11-25 MED ORDER — ACETAMINOPHEN 325 MG PO TABS: 650.0000 mg | ORAL_TABLET | Freq: Four times a day (QID) | ORAL | Status: DC | PRN

## 2023-11-25 MED ORDER — POTASSIUM CHLORIDE 10 MEQ/100ML IV SOLN
10.0000 meq | INTRAVENOUS | Status: AC
  Administered 2023-11-25 (×3): 10 meq via INTRAVENOUS
  Filled 2023-11-25 (×3): qty 100

## 2023-11-25 MED ORDER — SODIUM CHLORIDE 0.9% FLUSH
3.0000 mL | Freq: Two times a day (BID) | INTRAVENOUS | Status: DC
  Administered 2023-11-25 – 2023-12-11 (×32): 3 mL via INTRAVENOUS

## 2023-11-25 MED ORDER — POTASSIUM CHLORIDE CRYS ER 20 MEQ PO TBCR
40.0000 meq | EXTENDED_RELEASE_TABLET | Freq: Once | ORAL | Status: AC
  Administered 2023-11-25: 40 meq via ORAL
  Filled 2023-11-25: qty 2

## 2023-11-25 MED ORDER — ORAL CARE MOUTH RINSE
15.0000 mL | OROMUCOSAL | Status: DC | PRN
Start: 2023-11-25 — End: 2023-11-27

## 2023-11-25 MED ORDER — SODIUM PHOSPHATES 45 MMOLE/15ML IV SOLN
15.0000 mmol | Freq: Once | INTRAVENOUS | Status: AC
  Administered 2023-11-25: 15 mmol via INTRAVENOUS
  Filled 2023-11-25: qty 5

## 2023-11-25 NOTE — ED Triage Notes (Signed)
 Pt bib EMS from home. Pt c/o cramping in hands. Pt states this normally happens when she is hypokalemic. Pt is supposed to be taking K but has not been able to afford medications. Pt states she did take some K she found at home in last 24 hours but not sure how much it was. Pt slurring but states it is her normal speech. Denies N/V but endorses diarrhea for last couple weeks. 300LR RAC 20
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
## 2023-11-25 NOTE — ED Provider Notes (Signed)
 Renee [name] Provider Note   CSN: [id number] Arrival date & time: 11/25/23  1244     Patient presents with: No chief complaint on file.   Renee [name] is a 48 y.o. female.  {Add pertinent medical, surgical, social history, OB history to HPI:1137} 48 year old female with a history of duodenal switch, vitamin K deficiency, and hypokalemia who presents emergency department hand cramping.  Patient reports that for several days she been having nonbloody nonmelanotic diarrhea.  2 loose stools per day. Says that today she started having cramping in both of her hands.  Says that since her bariatric surgery she has had low potassium and thinks that is likely what is causing her symptoms.  Has had some difficulty affording her medications as well.  No nausea or vomiting.  No abdominal pain.  Denies any alcohol  or drug use. Ran out of her medications several months ago       Prior to Admission medications   Medication Sig Start Date End Date Taking? Authorizing Provider  fluticasone  (FLONASE ) 50 MCG/ACT nasal spray Place 2 sprays into both nostrils daily as needed for allergies. 08/19/21   Cheryle Page, MD  folic acid  (FOLVITE ) 1 MG tablet Take 1 tablet (1 mg total) by mouth daily. 08/20/21   Cheryle Page, MD  levothyroxine  (SYNTHROID ) 200 MCG tablet TAKE 1 TABLET(200 MCG) BY MOUTH DAILY BEFORE BREAKFAST Patient taking differently: Take 200 mcg by mouth daily before breakfast. 08/17/19   Melonie Colonel, Mikel HERO, MD  montelukast  (SINGULAIR ) 10 MG tablet Take 1 tablet (10 mg total) by mouth at bedtime. Patient taking differently: Take 10 mg by mouth daily as needed (for allergires). 01/21/20   Just, Kelsea J, FNP  Multiple Vitamin (MULTI-VITAMIN) tablet Take by mouth.    [provider]  omeprazole  (PRILOSEC) 40 MG capsule Take 1 capsule (40 mg total) by mouth 2 (two) times daily before a meal.  **not covered by insurance** 08/19/21 09/18/21   Cheryle Page, MD  ondansetron  (ZOFRAN -ODT) 8 MG disintegrating tablet Take 1 tablet (8 mg total) by mouth 3 (three) times daily as needed. 08/21/20   Tobie Suzzane POUR, MD  thiamine  (VITAMIN B1) 100 MG tablet Take 1 tablet (100 mg total) by mouth daily. 08/20/21   Cheryle Page, MD    Allergies: Morphine and codeine, Penicillins, Diphenhydramine, Latex, Synthroid  [levothyroxine ], and Allopurinol    Review of Systems  Updated Vital Signs BP 106/64 (BP Location: Right Arm)   Pulse 67   Temp (!) 97.5 F (36.4 C) (Oral)   Resp 14   Ht 5' 7 (1.702 m)   Wt 70.3 kg   LMP 03/02/2021 Comment: perimenopause  SpO2 100%   BMI 24.28 kg/m   Physical Exam Vitals and nursing note reviewed.  Constitutional:      General: She is not in acute distress.    Appearance: She is well-developed.  HENT:     Head: Normocephalic and atraumatic.     Right Ear: External ear normal.     Left Ear: External ear normal.     Nose: Nose normal.  Eyes:     Extraocular Movements: Extraocular movements intact.     Conjunctiva/sclera: Conjunctivae normal.     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 There is no distension.     Palpations: Abdomen is soft. There is no mass.     Tenderness: There is no abdominal tenderness. There is no guarding.  Musculoskeletal:     Cervical back: Normal range of motion and neck supple.     Right lower leg: No edema.     Left lower leg: No edema.     Comments: Radial pulses 2+  Skin:    General: Skin is warm and dry.  Neurological:     Mental Status: She is alert and oriented to person, place, and time. Mental status is at baseline.     Cranial Nerves: No cranial nerve deficit (aside from missing L eye).     Sensory: No sensory deficit.     Motor: No weakness.  Psychiatric:        Mood and Affect: Mood normal.     (all labs ordered are listed, but only abnormal results are displayed) Labs Reviewed  CBC WITH  DIFFERENTIAL/PLATELET  COMPREHENSIVE METABOLIC PANEL WITH GFR  MAGNESIUM   PROTIME-INR    EKG: None  Radiology: No results found.  {Document cardiac monitor, telemetry assessment procedure when appropriate:32947} Procedures   Medications Ordered in the ED - No data to display    {Click here for ABCD2, HEART and other calculators REFRESH Note before signing:1}                              Medical Decision Making Amount and/or Complexity of Data Reviewed Labs: ordered.  Risk Prescription drug management. Decision regarding hospitalization.   ***  {Document critical care time when appropriate  Document review of labs and clinical decision tools ie CHADS2VASC2, etc  Document your independent review of radiology images and any outside records  Document your discussion with family members, caretakers and with consultants  Document social determinants of health affecting pt's care  Document your decision making why or why not admission, treatments were needed:32947:::1}   Final diagnoses:  None    ED Discharge Orders     None

## 2023-11-26 ENCOUNTER — Inpatient Hospital Stay (HOSPITAL_COMMUNITY): Admitting: Anesthesiology

## 2023-11-26 ENCOUNTER — Inpatient Hospital Stay (HOSPITAL_COMMUNITY)

## 2023-11-26 ENCOUNTER — Encounter (HOSPITAL_COMMUNITY)
Admission: EM | Disposition: A | Discharge status: Rehabilitation Facility | Payer: Self-pay | Source: Home / Self Care | Attending: Internal Medicine

## 2023-11-26 DIAGNOSIS — Z87891 Personal history of nicotine dependence: Secondary | ICD-10-CM

## 2023-11-26 DIAGNOSIS — K469 Unspecified abdominal hernia without obstruction or gangrene: Secondary | ICD-10-CM

## 2023-11-26 DIAGNOSIS — I10 Essential (primary) hypertension: Secondary | ICD-10-CM | POA: Diagnosis not present

## 2023-11-26 LAB — COMPREHENSIVE METABOLIC PANEL WITH GFR
ALT: 72 U/L — ABNORMAL HIGH (ref 0–44)
ALT: 64 U/L — ABNORMAL HIGH (ref 0–44)
ALT: 64 U/L — ABNORMAL HIGH (ref 0–44)
AST: 210 U/L — ABNORMAL HIGH (ref 15–41)
AST: 149 U/L — ABNORMAL HIGH (ref 15–41)
AST: 120 U/L — ABNORMAL HIGH (ref 15–41)
Albumin: 3.5 g/dL (ref 3.5–5.0)
Albumin: 3.4 g/dL — ABNORMAL LOW (ref 3.5–5.0)
Albumin: 3.5 g/dL (ref 3.5–5.0)
Alkaline Phosphatase: 252 U/L — ABNORMAL HIGH (ref 38–126)
Alkaline Phosphatase: 233 U/L — ABNORMAL HIGH (ref 38–126)
Alkaline Phosphatase: 250 U/L — ABNORMAL HIGH (ref 38–126)
Anion gap: 11 (ref 5–15)
Anion gap: 9 (ref 5–15)
Anion gap: 12 (ref 5–15)
BUN: 7 mg/dL (ref 6–20)
BUN: 8 mg/dL (ref 6–20)
BUN: 8 mg/dL (ref 6–20)
CO2: 18 mmol/L — ABNORMAL LOW (ref 22–32)
CO2: 21 mmol/L — ABNORMAL LOW (ref 22–32)
CO2: 17 mmol/L — ABNORMAL LOW (ref 22–32)
Calcium: 6.5 mg/dL — ABNORMAL LOW (ref 8.9–10.3)
Calcium: 7.3 mg/dL — ABNORMAL LOW (ref 8.9–10.3)
Calcium: 6.9 mg/dL — ABNORMAL LOW (ref 8.9–10.3)
Chloride: 110 mmol/L (ref 98–111)
Chloride: 110 mmol/L (ref 98–111)
Chloride: 112 mmol/L — ABNORMAL HIGH (ref 98–111)
Creatinine, Ser: 0.7 mg/dL (ref 0.44–1.00)
Creatinine, Ser: 0.68 mg/dL (ref 0.44–1.00)
Creatinine, Ser: 0.65 mg/dL (ref 0.44–1.00)
GFR, Estimated: >60 mL/min (ref >60)
GFR, Estimated: >60 mL/min (ref >60)
GFR, Estimated: >60 mL/min (ref >60)
Glucose, Bld: 79 mg/dL (ref 70–99)
Glucose, Bld: 87 mg/dL (ref 70–99)
Glucose, Bld: 147 mg/dL — ABNORMAL HIGH (ref 70–99)
Potassium: 3.2 mmol/L — ABNORMAL LOW (ref 3.5–5.1)
Potassium: 3.2 mmol/L — ABNORMAL LOW (ref 3.5–5.1)
Potassium: 3.3 mmol/L — ABNORMAL LOW (ref 3.5–5.1)
Sodium: 139 mmol/L (ref 135–145)
Sodium: 140 mmol/L (ref 135–145)
Sodium: 140 mmol/L (ref 135–145)
Total Bilirubin: 1.4 mg/dL — ABNORMAL HIGH (ref 0.0–1.2)
Total Bilirubin: 1.2 mg/dL (ref 0.0–1.2)
Total Bilirubin: 1.6 mg/dL — ABNORMAL HIGH (ref 0.0–1.2)
Total Protein: 6 g/dL — ABNORMAL LOW (ref 6.5–8.1)
Total Protein: 5.7 g/dL — ABNORMAL LOW (ref 6.5–8.1)
Total Protein: 5.9 g/dL — ABNORMAL LOW (ref 6.5–8.1)

## 2023-11-26 LAB — POCT I-STAT 7, (LYTES, BLD GAS, ICA,H+H)
Acid-base deficit: 15 mmol/L — ABNORMAL HIGH (ref 0.0–2.0)
Acid-base deficit: 7 mmol/L — ABNORMAL HIGH (ref 0.0–2.0)
Bicarbonate: 13.7 mmol/L — ABNORMAL LOW (ref 20.0–28.0)
Bicarbonate: 19.3 mmol/L — ABNORMAL LOW (ref 20.0–28.0)
Calcium, Ion: 0.93 mmol/L — ABNORMAL LOW (ref 1.15–1.40)
Calcium, Ion: 0.98 mmol/L — ABNORMAL LOW (ref 1.15–1.40)
HCT: 22 % — ABNORMAL LOW (ref 36.0–46.0)
HCT: 25 % — ABNORMAL LOW (ref 36.0–46.0)
Hemoglobin: 7.5 g/dL — ABNORMAL LOW (ref 12.0–15.0)
Hemoglobin: 8.5 g/dL — ABNORMAL LOW (ref 12.0–15.0)
O2 Saturation: 94 %
O2 Saturation: 97 %
Potassium: 2.9 mmol/L — ABNORMAL LOW (ref 3.5–5.1)
Potassium: 3.1 mmol/L — ABNORMAL LOW (ref 3.5–5.1)
Sodium: 143 mmol/L (ref 135–145)
Sodium: 147 mmol/L — ABNORMAL HIGH (ref 135–145)
TCO2: 15 mmol/L — ABNORMAL LOW (ref 22–32)
TCO2: 21 mmol/L — ABNORMAL LOW (ref 22–32)
pCO2 arterial: 43.6 mmHg (ref 32–48)
pCO2 arterial: 46.4 mmHg (ref 32–48)
pH, Arterial: 7.077 — CL (ref 7.35–7.45)
pH, Arterial: 7.254 — ABNORMAL LOW (ref 7.35–7.45)
pO2, Arterial: 130 mmHg — ABNORMAL HIGH (ref 83–108)
pO2, Arterial: 83 mmHg (ref 83–108)

## 2023-11-26 LAB — BASIC METABOLIC PANEL WITH GFR
Anion gap: 9 (ref 5–15)
BUN: 7 mg/dL (ref 6–20)
CO2: 19 mmol/L — ABNORMAL LOW (ref 22–32)
Calcium: 6 mg/dL — CL (ref 8.9–10.3)
Chloride: 114 mmol/L — ABNORMAL HIGH (ref 98–111)
Creatinine, Ser: 0.47 mg/dL (ref 0.44–1.00)
GFR, Estimated: >60 mL/min (ref >60)
Glucose, Bld: 114 mg/dL — ABNORMAL HIGH (ref 70–99)
Potassium: 3.2 mmol/L — ABNORMAL LOW (ref 3.5–5.1)
Sodium: 142 mmol/L (ref 135–145)

## 2023-11-26 LAB — CBC
HCT: 32.7 % — ABNORMAL LOW (ref 36.0–46.0)
HCT: 38.3 % (ref 36.0–46.0)
HCT: 27.1 % — ABNORMAL LOW (ref 36.0–46.0)
Hemoglobin: 10.5 g/dL — ABNORMAL LOW (ref 12.0–15.0)
Hemoglobin: 12.5 g/dL (ref 12.0–15.0)
Hemoglobin: 9.1 g/dL — ABNORMAL LOW (ref 12.0–15.0)
MCH: 29.5 pg (ref 26.0–34.0)
MCH: 29.8 pg (ref 26.0–34.0)
MCH: 29.4 pg (ref 26.0–34.0)
MCHC: 32.1 g/dL (ref 30.0–36.0)
MCHC: 32.6 g/dL (ref 30.0–36.0)
MCHC: 33.6 g/dL (ref 30.0–36.0)
MCV: 91.9 fL (ref 80.0–100.0)
MCV: 91.4 fL (ref 80.0–100.0)
MCV: 87.4 fL (ref 80.0–100.0)
Platelets: 186 K/uL (ref 150–400)
Platelets: 178 K/uL (ref 150–400)
Platelets: 136 K/uL — ABNORMAL LOW (ref 150–400)
RBC: 3.56 MIL/uL — ABNORMAL LOW (ref 3.87–5.11)
RBC: 4.19 MIL/uL (ref 3.87–5.11)
RBC: 3.1 MIL/uL — ABNORMAL LOW (ref 3.87–5.11)
RDW: 19.6 % — ABNORMAL HIGH (ref 11.5–15.5)
RDW: 19.8 % — ABNORMAL HIGH (ref 11.5–15.5)
RDW: 19 % — ABNORMAL HIGH (ref 11.5–15.5)
WBC: 13.1 K/uL — ABNORMAL HIGH (ref 4.0–10.5)
WBC: 16.1 K/uL — ABNORMAL HIGH (ref 4.0–10.5)
WBC: 6.8 K/uL (ref 4.0–10.5)
nRBC: 0 % (ref 0.0–0.2)
nRBC: 0 % (ref 0.0–0.2)
nRBC: 0 % (ref 0.0–0.2)

## 2023-11-26 LAB — MRSA NEXT GEN BY PCR, NASAL: MRSA by PCR Next Gen: NOT DETECTED

## 2023-11-26 LAB — PHOSPHORUS: Phosphorus: 1.3 mg/dL — ABNORMAL LOW (ref 2.5–4.6)

## 2023-11-26 LAB — LACTIC ACID, PLASMA: Lactic Acid, Venous: 1.5 mmol/L (ref 0.5–1.9)

## 2023-11-26 LAB — MAGNESIUM: Magnesium: 1.6 mg/dL — ABNORMAL LOW (ref 1.7–2.4)

## 2023-11-26 LAB — VITAMIN D 25 HYDROXY (VIT D DEFICIENCY, FRACTURES): Vit D, 25-Hydroxy: <4.2 ng/mL — ABNORMAL LOW (ref 30–100)

## 2023-11-26 LAB — T4, FREE: Free T4: <0.25 ng/dL — ABNORMAL LOW (ref 0.61–1.12)

## 2023-11-26 LAB — AMMONIA: Ammonia: 108 umol/L — ABNORMAL HIGH (ref 9–35)

## 2023-11-26 LAB — CORTISOL-AM, BLOOD: Cortisol - AM: 11.7 ug/dL (ref 6.7–22.6)

## 2023-11-26 LAB — PROTIME-INR
INR: 1.4 — ABNORMAL HIGH (ref 0.8–1.2)
Prothrombin Time: 18.3 s — ABNORMAL HIGH (ref 11.4–15.2)

## 2023-11-26 MED ORDER — ROCURONIUM BROMIDE 10 MG/ML (PF) SYRINGE
10.0000 mg | PREFILLED_SYRINGE | INTRAVENOUS | Status: DC | PRN
  Filled 2023-11-26: qty 10

## 2023-11-26 MED ORDER — POLYETHYLENE GLYCOL 3350 17 G PO PACK: 17.0000 g | PACK | Freq: Every day | ORAL | Status: DC

## 2023-11-26 MED ORDER — FENTANYL CITRATE (PF) 50 MCG/ML IJ SOSY: 25.0000 ug | PREFILLED_SYRINGE | Freq: Once | INTRAMUSCULAR | Status: DC

## 2023-11-26 MED ORDER — IOHEXOL 300 MG/ML  SOLN
100.0000 mL | Freq: Once | INTRAMUSCULAR | Status: AC | PRN
  Administered 2023-11-26: 100 mL via INTRAVENOUS

## 2023-11-26 MED ORDER — POTASSIUM CHLORIDE 10 MEQ/100ML IV SOLN
10.0000 meq | INTRAVENOUS | Status: AC
Start: 2023-11-26 — End: 2023-11-26
  Administered 2023-11-26 (×2): 10 meq via INTRAVENOUS
  Filled 2023-11-26 (×2): qty 100

## 2023-11-26 MED ORDER — MIDAZOLAM HCL 2 MG/2ML IJ SOLN
INTRAMUSCULAR | Status: AC
  Filled 2023-11-26: qty 2

## 2023-11-26 MED ORDER — ROCURONIUM BROMIDE 100 MG/10ML IV SOLN
INTRAVENOUS | Status: DC | PRN
  Administered 2023-11-26: 50 mg via INTRAVENOUS
  Administered 2023-11-26 (×2): 20 mg via INTRAVENOUS
  Administered 2023-11-26: 10 mg via INTRAVENOUS

## 2023-11-26 MED ORDER — LIDOCAINE HCL (PF) 2 % IJ SOLN
INTRAMUSCULAR | Status: AC
  Filled 2023-11-26: qty 5

## 2023-11-26 MED ORDER — PANTOPRAZOLE SODIUM 40 MG IV SOLR
40.0000 mg | Freq: Every day | INTRAVENOUS | Status: DC
  Administered 2023-11-27 – 2023-12-01 (×5): 40 mg via INTRAVENOUS
  Filled 2023-11-26 (×5): qty 10

## 2023-11-26 MED ORDER — SODIUM BICARBONATE 8.4 % IV SOLN
INTRAVENOUS | Status: DC | PRN
  Administered 2023-11-26 (×2): 50 mL via INTRAVENOUS

## 2023-11-26 MED ORDER — POTASSIUM CHLORIDE CRYS ER 20 MEQ PO TBCR
40.0000 meq | EXTENDED_RELEASE_TABLET | Freq: Once | ORAL | Status: AC
  Administered 2023-11-26: 40 meq via ORAL
  Filled 2023-11-26: qty 2

## 2023-11-26 MED ORDER — HYDROMORPHONE HCL 1 MG/ML IJ SOLN
0.5000 mg | INTRAMUSCULAR | Status: DC | PRN
  Administered 2023-11-26: 0.5 mg via INTRAVENOUS

## 2023-11-26 MED ORDER — PHENYLEPHRINE HCL-NACL 20-0.9 MG/250ML-% IV SOLN
25.0000 ug/min | INTRAVENOUS | Status: DC
  Administered 2023-11-26: 150 ug/min via INTRAVENOUS
  Administered 2023-11-26: 200 ug/min via INTRAVENOUS
  Administered 2023-11-27: 133.3333 ug/min via INTRAVENOUS
  Administered 2023-11-27: 200 ug/min via INTRAVENOUS
  Administered 2023-11-27: 170 ug/min via INTRAVENOUS
  Administered 2023-11-27: 80 ug/min via INTRAVENOUS
  Administered 2023-11-27: 100 ug/min via INTRAVENOUS
  Administered 2023-11-27 (×2): 200 ug/min via INTRAVENOUS
  Administered 2023-11-27: 110 ug/min via INTRAVENOUS
  Administered 2023-11-28: 100 ug/min via INTRAVENOUS
  Administered 2023-11-28: 110 ug/min via INTRAVENOUS
  Administered 2023-11-28: 100 ug/min via INTRAVENOUS
  Filled 2023-11-26 (×2): qty 250
  Filled 2023-11-26: qty 500
  Filled 2023-11-26: qty 250
  Filled 2023-11-26: qty 500
  Filled 2023-11-26 (×6): qty 250

## 2023-11-26 MED ORDER — SODIUM BICARBONATE 8.4 % IV SOLN
100.0000 meq | Freq: Once | INTRAVENOUS | Status: AC
  Administered 2023-11-26: 100 meq via INTRAVENOUS
  Filled 2023-11-26: qty 100

## 2023-11-26 MED ORDER — PHENYLEPHRINE 80 MCG/ML (10ML) SYRINGE FOR IV PUSH (FOR BLOOD PRESSURE SUPPORT)
PREFILLED_SYRINGE | INTRAVENOUS | Status: DC | PRN
  Administered 2023-11-26: 160 ug via INTRAVENOUS

## 2023-11-26 MED ORDER — EPHEDRINE SULFATE-NACL 50-0.9 MG/10ML-% IV SOSY
PREFILLED_SYRINGE | INTRAVENOUS | Status: DC | PRN
  Administered 2023-11-26: 10 mg via INTRAVENOUS

## 2023-11-26 MED ORDER — SUCCINYLCHOLINE CHLORIDE 200 MG/10ML IV SOSY
PREFILLED_SYRINGE | INTRAVENOUS | Status: DC | PRN
  Administered 2023-11-26: 140 mg via INTRAVENOUS

## 2023-11-26 MED ORDER — FENTANYL CITRATE (PF) 250 MCG/5ML IJ SOLN
INTRAMUSCULAR | Status: AC
  Filled 2023-11-26: qty 5

## 2023-11-26 MED ORDER — K PHOS MONO-SOD PHOS DI & MONO 155-852-130 MG PO TABS
500.0000 mg | ORAL_TABLET | Freq: Three times a day (TID) | ORAL | Status: AC
  Administered 2023-11-26: 500 mg via ORAL
  Filled 2023-11-26: qty 2

## 2023-11-26 MED ORDER — VITAMIN D (ERGOCALCIFEROL) 1.25 MG (50000 UNIT) PO CAPS
50000.0000 [IU] | ORAL_CAPSULE | ORAL | Status: DC
  Filled 2023-11-26: qty 1

## 2023-11-26 MED ORDER — DICYCLOMINE HCL 10 MG PO CAPS: 10.0000 mg | ORAL_CAPSULE | Freq: Three times a day (TID) | ORAL | Status: DC

## 2023-11-26 MED ORDER — LACTULOSE 10 GM/15ML PO SOLN
20.0000 g | Freq: Three times a day (TID) | ORAL | Status: DC
  Administered 2023-11-26: 20 g via ORAL
  Filled 2023-11-26 (×2): qty 30

## 2023-11-26 MED ORDER — HYDROMORPHONE HCL 1 MG/ML IJ SOLN
0.5000 mg | INTRAMUSCULAR | Status: DC | PRN
  Filled 2023-11-26: qty 1

## 2023-11-26 MED ORDER — PNEUMOCOCCAL 20-VAL CONJ VACC 0.5 ML IM SUSY: 0.5000 mL | PREFILLED_SYRINGE | INTRAMUSCULAR | Status: DC | PRN

## 2023-11-26 MED ORDER — METOCLOPRAMIDE HCL 5 MG/ML IJ SOLN
10.0000 mg | Freq: Four times a day (QID) | INTRAMUSCULAR | Status: DC | PRN
  Administered 2023-11-26: 10 mg via INTRAVENOUS
  Filled 2023-11-26: qty 2

## 2023-11-26 MED ORDER — SODIUM CHLORIDE 0.9 % IV BOLUS
1000.0000 mL | Freq: Once | INTRAVENOUS | Status: AC
  Administered 2023-11-26: 1000 mL via INTRAVENOUS

## 2023-11-26 MED ORDER — PROPOFOL 1000 MG/100ML IV EMUL
INTRAVENOUS | Status: AC
  Filled 2023-11-26: qty 100

## 2023-11-26 MED ORDER — SUCCINYLCHOLINE CHLORIDE 200 MG/10ML IV SOSY
PREFILLED_SYRINGE | INTRAVENOUS | Status: AC
  Filled 2023-11-26: qty 10

## 2023-11-26 MED ORDER — METRONIDAZOLE 500 MG/100ML IV SOLN
500.0000 mg | Freq: Two times a day (BID) | INTRAVENOUS | Status: AC
  Administered 2023-11-26 – 2023-11-30 (×9): 500 mg via INTRAVENOUS
  Filled 2023-11-26 (×9): qty 100

## 2023-11-26 MED ORDER — FENTANYL CITRATE (PF) 50 MCG/ML IJ SOSY: 50.0000 ug | PREFILLED_SYRINGE | INTRAMUSCULAR | Status: DC | PRN

## 2023-11-26 MED ORDER — 0.9 % SODIUM CHLORIDE (POUR BTL) OPTIME
TOPICAL | Status: DC | PRN
Start: 2023-11-26 — End: 2023-11-26
  Administered 2023-11-26: 3000 mL

## 2023-11-26 MED ORDER — LACTATED RINGERS IV SOLN: INTRAVENOUS | Status: DC | PRN

## 2023-11-26 MED ORDER — FENTANYL 2500MCG IN NS 250ML (10MCG/ML) PREMIX INFUSION
0.0000 ug/h | INTRAVENOUS | Status: DC
  Administered 2023-11-26: 25 ug/h via INTRAVENOUS
  Administered 2023-11-28: 100 ug/h via INTRAVENOUS
  Filled 2023-11-26 (×2): qty 250

## 2023-11-26 MED ORDER — SODIUM CHLORIDE 0.9 % IV SOLN: INTRAVENOUS | Status: DC

## 2023-11-26 MED ORDER — SODIUM CHLORIDE 0.9 % IV SOLN
2.0000 g | INTRAVENOUS | Status: AC
  Administered 2023-11-26 – 2023-12-01 (×5): 2 g via INTRAVENOUS
  Filled 2023-11-26 (×5): qty 20

## 2023-11-26 MED ORDER — PROPOFOL 1000 MG/100ML IV EMUL
0.0000 ug/kg/min | INTRAVENOUS | Status: DC
  Administered 2023-11-27: 60 ug/kg/min via INTRAVENOUS
  Administered 2023-11-27: 40 ug/kg/min via INTRAVENOUS
  Administered 2023-11-27: 50 ug/kg/min via INTRAVENOUS
  Administered 2023-11-27: 55 ug/kg/min via INTRAVENOUS
  Administered 2023-11-27: 60 ug/kg/min via INTRAVENOUS
  Administered 2023-11-27: 55 ug/kg/min via INTRAVENOUS
  Administered 2023-11-28 (×2): 40 ug/kg/min via INTRAVENOUS
  Administered 2023-11-28: 55 ug/kg/min via INTRAVENOUS
  Administered 2023-11-28: 15 ug/kg/min via INTRAVENOUS
  Filled 2023-11-26 (×10): qty 100

## 2023-11-26 MED ORDER — PROPOFOL 10 MG/ML IV BOLUS
INTRAVENOUS | Status: DC | PRN
  Administered 2023-11-26: 35 ug/kg/min via INTRAVENOUS
  Administered 2023-11-26: 70 mg via INTRAVENOUS

## 2023-11-26 MED ORDER — PHENYLEPHRINE HCL (PRESSORS) 10 MG/ML IV SOLN
INTRAVENOUS | Status: AC
  Filled 2023-11-26: qty 1

## 2023-11-26 MED ORDER — PHENYLEPHRINE HCL-NACL 20-0.9 MG/250ML-% IV SOLN
INTRAVENOUS | Status: DC | PRN
Start: 2023-11-26 — End: 2023-11-26
  Administered 2023-11-26: 50 ug/min via INTRAVENOUS

## 2023-11-26 MED ORDER — THIAMINE HCL 100 MG PO TABS
100.0000 mg | ORAL_TABLET | Freq: Every day | ORAL | Status: DC
  Filled 2023-11-26: qty 1

## 2023-11-26 MED ORDER — LACTATED RINGERS IV BOLUS
1000.0000 mL | Freq: Once | INTRAVENOUS | Status: AC
  Administered 2023-11-26: 1000 mL via INTRAVENOUS

## 2023-11-26 MED ORDER — FENTANYL CITRATE (PF) 100 MCG/2ML IJ SOLN
INTRAMUSCULAR | Status: DC | PRN
  Administered 2023-11-26: 50 ug via INTRAVENOUS

## 2023-11-26 MED ORDER — SODIUM CHLORIDE 0.9 % IV SOLN: Freq: Once | INTRAVENOUS | Status: AC

## 2023-11-26 MED ORDER — PROPOFOL 10 MG/ML IV BOLUS
INTRAVENOUS | Status: AC
  Filled 2023-11-26: qty 20

## 2023-11-26 MED ORDER — SODIUM BICARBONATE 8.4 % IV SOLN
INTRAVENOUS | Status: AC
Start: 2023-11-26 — End: 2023-11-26
  Filled 2023-11-26: qty 50

## 2023-11-26 MED ORDER — THIAMINE HCL 100 MG/ML IJ SOLN
100.0000 mg | Freq: Every day | INTRAMUSCULAR | Status: DC
  Administered 2023-11-27 – 2023-12-01 (×5): 100 mg via INTRAVENOUS
  Filled 2023-11-26 (×5): qty 2

## 2023-11-26 MED ORDER — CEFAZOLIN SODIUM-DEXTROSE 2-3 GM-%(50ML) IV SOLR
INTRAVENOUS | Status: DC | PRN
  Administered 2023-11-26: 2 g via INTRAVENOUS

## 2023-11-26 MED ORDER — HYDROMORPHONE HCL 1 MG/ML IJ SOLN: 1.0000 mg | INTRAMUSCULAR | Status: DC | PRN

## 2023-11-26 MED ORDER — ONDANSETRON HCL 4 MG/2ML IJ SOLN
4.0000 mg | Freq: Once | INTRAMUSCULAR | Status: AC
  Administered 2023-11-26: 4 mg via INTRAVENOUS
  Filled 2023-11-26: qty 2

## 2023-11-26 MED ORDER — FOLIC ACID 1 MG PO TABS: 1.0000 mg | ORAL_TABLET | Freq: Every day | ORAL | Status: DC

## 2023-11-26 MED ORDER — PANTOPRAZOLE SODIUM 40 MG PO TBEC: 40.0000 mg | DELAYED_RELEASE_TABLET | Freq: Every day | ORAL | Status: DC

## 2023-11-26 MED ORDER — LEVOTHYROXINE SODIUM 100 MCG PO TABS
200.0000 ug | ORAL_TABLET | Freq: Every day | ORAL | Status: DC
  Administered 2023-11-26: 200 ug via ORAL
  Filled 2023-11-26: qty 2

## 2023-11-26 MED ORDER — DOCUSATE SODIUM 50 MG/5ML PO LIQD: 100.0000 mg | Freq: Two times a day (BID) | ORAL | Status: DC

## 2023-11-26 MED ORDER — CALCIUM CITRATE 950 (200 CA) MG PO TABS
200.0000 mg | ORAL_TABLET | Freq: Three times a day (TID) | ORAL | Status: DC
  Filled 2023-11-26 (×13): qty 1

## 2023-11-26 MED ORDER — FENTANYL BOLUS VIA INFUSION
25.0000 ug | INTRAVENOUS | Status: DC | PRN
  Administered 2023-11-27: 100 ug via INTRAVENOUS
  Administered 2023-11-27: 50 ug via INTRAVENOUS

## 2023-11-26 MED ORDER — BUPIVACAINE-EPINEPHRINE (PF) 0.25% -1:200000 IJ SOLN
INTRAMUSCULAR | Status: AC
  Filled 2023-11-26: qty 30

## 2023-11-26 MED ORDER — PHENYLEPHRINE 80 MCG/ML (10ML) SYRINGE FOR IV PUSH (FOR BLOOD PRESSURE SUPPORT)
PREFILLED_SYRINGE | INTRAVENOUS | Status: AC
  Filled 2023-11-26: qty 10

## 2023-11-26 MED ORDER — DICYCLOMINE HCL 10 MG/ML IM SOLN
20.0000 mg | Freq: Once | INTRAMUSCULAR | Status: DC
  Filled 2023-11-26: qty 2

## 2023-11-26 MED ORDER — ROCURONIUM BROMIDE 10 MG/ML (PF) SYRINGE
PREFILLED_SYRINGE | INTRAVENOUS | Status: AC
  Filled 2023-11-26: qty 10

## 2023-11-26 MED ORDER — ONDANSETRON HCL 4 MG/2ML IJ SOLN
INTRAMUSCULAR | Status: AC
  Filled 2023-11-26: qty 4

## 2023-11-26 MED ORDER — ALBUMIN HUMAN 5 % IV SOLN: INTRAVENOUS | Status: DC | PRN

## 2023-11-26 MED ORDER — MAGNESIUM SULFATE 2 GM/50ML IV SOLN
2.0000 g | Freq: Once | INTRAVENOUS | Status: AC
  Administered 2023-11-26: 2 g via INTRAVENOUS
  Filled 2023-11-26: qty 50

## 2023-11-26 MED ORDER — POLYETHYLENE GLYCOL 3350 17 G PO PACK: 17.0000 g | PACK | Freq: Two times a day (BID) | ORAL | Status: DC

## 2023-11-26 MED ORDER — INFLUENZA VIRUS VACC SPLIT PF (FLUZONE) 0.5 ML IM SUSY: 0.5000 mL | PREFILLED_SYRINGE | INTRAMUSCULAR | Status: DC | PRN

## 2023-11-26 MED ORDER — SODIUM CHLORIDE 0.9 % IV SOLN
250.0000 mL | INTRAVENOUS | Status: AC
  Administered 2023-11-26: 250 mL via INTRAVENOUS

## 2023-11-26 MED ORDER — LEVOTHYROXINE SODIUM 100 MCG PO TABS: 200.0000 ug | ORAL_TABLET | Freq: Every day | ORAL | Status: DC

## 2023-11-26 MED ORDER — SIMETHICONE 80 MG PO CHEW
80.0000 mg | CHEWABLE_TABLET | Freq: Four times a day (QID) | ORAL | Status: DC | PRN
  Administered 2023-11-26: 80 mg via ORAL
  Filled 2023-11-26: qty 1

## 2023-11-26 NOTE — Anesthesia Postprocedure Evaluation (Signed)
 Anesthesia Post Note  Patient: Renee [name]  Procedure(s) Performed: LAPAROSCOPY, DIAGNOSTIC (Abdomen) LAPAROTOMY, EXPLORATORY (Abdomen) APPLICATION, WOUND VAC (Abdomen)     Patient location during evaluation: ICU Anesthesia Type: General Level of consciousness: patient remains intubated per anesthesia plan Pain management: pain level controlled Vital Signs Assessment: post-procedure vital signs reviewed and stable Respiratory status: patient remains intubated per anesthesia plan Cardiovascular status: blood pressure returned to baseline Postop Assessment: no headache and no backache Anesthetic complications: no   No notable events documented.  Last Vitals:  Vitals:   11/26/23 1831 11/26/23 1954  BP:    Pulse:    Resp:    Temp: 37.3 C   SpO2:  97%    Last Pain:  Vitals:   11/26/23 1831  TempSrc: Axillary  PainSc:                  Dionysios Massman L Mayu Ronk

## 2023-11-26 NOTE — Anesthesia Procedure Notes (Signed)
 Procedure Name: Intubation Date/Time: 11/26/2023 6:03 PM  Performed by: Obadiah Reyes BROCKS, CRNAPre-anesthesia Checklist: Patient identified, Emergency Drugs available, Suction available and Patient being monitored Patient Re-evaluated:Patient Re-evaluated prior to induction Oxygen Delivery Method: Circle System Utilized Preoxygenation: Pre-oxygenation with 100% oxygen Induction Type: IV induction and Rapid sequence Ventilation: Mask ventilation without difficulty Laryngoscope Size: Miller and 2 Grade View: Grade I Tube type: Oral Tube size: 7.0 mm Number of attempts: 1 Airway Equipment and Method: Stylet and Oral airway Placement Confirmation: ETT inserted through vocal cords under direct vision, positive ETCO2 and breath sounds checked- equal and bilateral Secured at: 21 cm Tube secured with: Tape Dental Injury: Teeth and Oropharynx as per pre-operative assessment

## 2023-11-26 NOTE — Progress Notes (Signed)
 eLink Physician-Brief Progress Note Patient Name: Leva Baine DOB: 1975-07-14 MRN: [id number]   Date of Service  11/26/2023  HPI/Events of Note  48 yr old female with prior bariatric surgery presented with abd pain.  CT abd concerning for ischemic bowel.  pt was subsequently taken for emergent lap. Post operatively pt was brought to ICU with open abdomen and request from surgery to maintain deep sedation for pt with vasopressor support. They have plans for return to OR tomorrow for evaluation of questionable dusky bowel section.   Intubated and on vasopressor support.  Receiving empiric abx and IVF's.  eICU Interventions  Chart reviewed     Intervention Category Evaluation Type: New Patient Evaluation  CLAUDENE AGENT, P 11/26/2023, 10:21 PM

## 2023-11-26 NOTE — Op Note (Signed)
 Pre-op diagnosis: Possible mesenteric ischemia secondary to internal hernia Postop diagnosis:  Pseudo-Petersen's internal hernia  Procedure performed: Diagnostic laparoscopy converted to exploratory laparotomy, placement of AbThera VAC (non-disposable DME), closure of internal hernia defect  Surgeon:Jeramine Delis K Jeronimo Hellberg Assistant: Dr. 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 Winfred at [facility]. The patient has been very non-compliant with her prescribed medications, including thyroid  medication and vitamins. She also has bipolar disorder, PTSD. She presented yesterday with cramping and weakness. Today, she had acute onset of severe abdominal pain and has become more bradycardic. CT scan showed pneumatosis and large portal venous gas. We were emergently consulted to see the patient.  Due to her abdominal exam and the CT findings, we recommended emergent surgery to rule out bowel ischemia.     Emergency consent was obtained from the patient's mother.  Surgeon assist was required for bariatric expertise and due to the complexity of the surgery.  Description of procedure: Patient is brought to the operating room placed in supine position on the operating table.  After an adequate level general anesthesia was obtained, Foley catheter was placed under sterile technique.  An arterial line was placed by anesthesia.  The patient's abdomen was quite distended.  Her abdomen was prepped with ChloraPrep and draped sterile fashion.  A timeout was taken to ensure the proper patient and proper procedure.  I made a 1 cm vertical incision above the umbilicus.  We dissected down to the fascia with cautery.  We carefully opened the fascia and entered the peritoneal cavity bluntly.  The visible small bowel is quite distended.  We inserted the blunt tipped Hassan  trocar and insufflated CO2 maintaining a maximum pressure of 15 mmHg.  The laparoscope was inserted.  All of the colon and small bowel was quite dilated and we were unable to visualize any significant portion of the abdominal cavity.  We made the decision convert to an exploratory laparotomy.  We released her insufflation and remove the trocar.  We extended our vertical midline incision and open the fascia.  The sigmoid colon is quite loose and redundant and very distended with gas.  We eviscerated her abdomen.  There are some omental adhesions in the left upper quadrant but otherwise minimal adhesions throughout the abdomen.  We identified the cecum and the terminal ileum.  We ran the bowel retrograde.  The majority the small bowel appears viable but distended.  However, there is a defect behind the alimentary limb where it passes anterior to the transverse colon.  It appears that part of the alimentary limb may have been intermittently sliding into this defect with intermittent obstruction.  The alimentary limb has some patchy areas of partial-thickness ischemia without obvious necrosis.  We made the decision to avoid any resection at this time.  We would leave the patient's abdomen open and will return in 24 to 48 hours for reexamination.  Please see Dr. Luigi accompanying note for a more detailed description of the anatomy after the 2 bariatric surgeries.  The colon appears viable but is quite distended.  The descending and sigmoid colon are quite redundant and massively distended with gas.  We attempted to reduce all of the small bowel and colon back into the abdomen but this was unsuccessful.  We selected a portion in the middle of the redundant sigmoid colon.  I placed a pursestring suture  of 3-0 silk.  We made a small colotomy in this area and evacuated a very large amount of gas which decompressed this redundant sigmoid colon.  The colotomy was closed with a pursestring suture.  We then were able  to reduce all of the bowel and colon back in the abdomen.  The ABThera sponge was then inserted over the small bowel.  The VAC sponges were trimmed to fit over the ABThera sponge.  This was secured with occlusive drape.  We connected suction tubing and connected this to the VAC pump.  There is no obvious leak.  Our sponge, instrument, and needle count was correct.  The patient is transported back to the intensive care unit intubated, sedated, and with paralysis.  She will remain intubated, sedated and paralyzed until return to the operating room.  Donnice POUR. Belinda, MD, [facility]  General Surgery   11/26/2023 7:36 PM
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
## 2023-11-26 NOTE — Anesthesia Preprocedure Evaluation (Addendum)
 Anesthesia Evaluation  Patient identified by MRN, date of birth, ID band Patient awake    Reviewed: Allergy & Precautions, NPO status , Patient's Chart, lab work & pertinent test results  Airway Mallampati: III  TM Distance: >3 FB Neck ROM: Full    Dental  (+) Poor Dentition, Chipped, Loose, Dental Advisory Given,    Pulmonary sleep apnea , Patient abstained from smoking., former smoker   Pulmonary exam normal breath sounds clear to auscultation       Cardiovascular hypertension, Normal cardiovascular exam Rhythm:Regular Rate:Normal     Neuro/Psych  Headaches PSYCHIATRIC DISORDERS Anxiety Depression Bipolar Disorder      GI/Hepatic negative GI ROS,,,(+)     substance abuse  alcohol  use  Endo/Other  negative endocrine ROS    Renal/GU negative Renal ROS  negative genitourinary   Musculoskeletal  (+)  Fibromyalgia -  Abdominal   Peds  (+) ATTENTION DEFICIT DISORDER WITHOUT HYPERACTIVITY Hematology negative hematology ROS (+)   Anesthesia Other Findings 45 female with history of hypertension, hyperlipidemia, hypothyroidism, anxiety, bipolar disorder, ADD, PTSD,  chronic pain, status post enucleation of the left eye ,fibromyalgia, history of bariatric surgery presented with a hand cramps. Significant laboratory findings. CT c/f ischemic bowel.   Reproductive/Obstetrics                              Anesthesia Physical Anesthesia Plan  ASA: 3 and emergent  Anesthesia Plan: General   Post-op Pain Management:    Induction: Intravenous and Rapid sequence  PONV Risk Score and Plan: 3 and Midazolam , Dexamethasone  and Ondansetron   Airway Management Planned: Oral ETT  Additional Equipment: Arterial line  Intra-op Plan:   Post-operative Plan: Extubation in OR and Possible Post-op intubation/ventilation  Informed Consent: I have reviewed the patients History and Physical, chart, labs and  discussed the procedure including the risks, benefits and alternatives for the proposed anesthesia with the patient or authorized representative who has indicated his/her understanding and acceptance.     Dental advisory given  Plan Discussed with: CRNA  Anesthesia Plan Comments:          Anesthesia Quick Evaluation

## 2023-11-26 NOTE — Transfer of Care (Signed)
 Immediate Anesthesia Transfer of Care Note  Patient: Renee [name]  Procedure(s) Performed: LAPAROSCOPY, DIAGNOSTIC (Abdomen) LAPAROTOMY, EXPLORATORY (Abdomen) APPLICATION, WOUND VAC (Abdomen)  Patient Location: ICU  Anesthesia Type:General  Level of Consciousness: Patient remains intubated per anesthesia plan  Airway & Oxygen Therapy: Patient remains intubated per anesthesia plan  Post-op Assessment: Report given to RN and Post -op Vital signs reviewed and stable  Post vital signs: Reviewed and stable  Last Vitals:  Vitals Value Taken Time  BP 89/54   Temp 96.8   Pulse 77   Resp 16   SpO2 97     Last Pain:  Vitals:   11/26/23 1831  TempSrc: Axillary  PainSc:          Complications: No notable events documented.

## 2023-11-26 NOTE — Op Note (Signed)
 11/25/2023 - 11/26/2023  7:21 PM  PATIENT:  Renee [name]  48 y.o. female  PRE-OPERATIVE DIAGNOSIS:  mesenteric ischemia  POST-OPERATIVE DIAGNOSIS:  abdominal pain, pseudo-petersen's internal hernia, pathc  PROCEDURE:  Procedure(s): LAPAROSCOPY, DIAGNOSTIC LAPAROTOMY, EXPLORATORY APPLICATION, WOUND VAC Closure of internal hernia defect at petersen's space  SURGEON:  Surgeon(s): Belinda Cough, MD   ASSISTANTS: Tanda Locus, MD   Please see Dr. Moss operative note for complete details regarding the procedure.  An designer, television/film set was requested due to the complexity of the patient's anatomy given her bariatric history.  I was asked to scrub in and assist Dr. Belinda.  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  After converting to a laparotomy we eviscerated the bowel.  There was no obvious frank ischemic or nonviable intestine.  There was 1 section of small bowel that had some patchy ischemia.  We started running the bowel at the cecum and starting at the terminal ileum and running back proximally.  The distal small bowel AKA the common channel was normal.  We came across the first small bowel anastomosis-the ilio ileostomy and that anastomosis was normal.  There was no mesenteric defect at that location.  We then ran the small bowel back proximately and came across another small bowel anastomosis probably at the jejunojejunostomy and that anastomosis was widely patent and there was no evidence of mesenteric defect.  And running back this section of small bowel back proximally all the way back to the ligament of Treitz and that was all normal.  We identified the alimentary limb aka the duodenal ileostomy limb.  I was able to visualize the duodenal ileostomy anastomosis a  little bit and it was viable.  And then running the alimentary/enteric/duodenal ileostomy limb distally there was some patchy ischemia as it extended below (it was antecolic to the transverse colon) the transverse colon.  There was a large mesenteric defect at this location between the mesentery of the alimentary limb and the transverse colon mesentery.  It appears that she might have been twisting her alimentary limb/duodenal ileostomy limb on itself.  We ended up closing that mesenteric defect with 3 interrupted silk sutures between the mesentery of the duodenal ileostomy limb to the base of the transverse colon mesentery.  There was still some patchy areas of ischemia to that duodenal ileostomy limb in that location but not frank ischemia where I thought we needed to resect it.  I think patient would benefit from a second look.  Please see Dr. Moss operative note for the remainder of of the procedure  Locus HERO. Tanda, MD, FACS General, Bariatric, & Minimally Invasive Surgery [facility], [facility]
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
## 2023-11-26 NOTE — Plan of Care (Signed)
  Problem: Clinical Measurements: Goal: Ability to maintain clinical measurements within normal limits will improve Outcome: Progressing Goal: Diagnostic test results will improve Outcome: Progressing Goal: Cardiovascular complication will be avoided Outcome: Progressing   Problem: Activity: Goal: Risk for activity intolerance will decrease Outcome: Progressing   Problem: Nutrition: Goal: Adequate nutrition will be maintained Outcome: Progressing   Problem: Pain Managment: Goal: General experience of comfort will improve and/or be controlled Outcome: Progressing
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
## 2023-11-26 NOTE — Progress Notes (Signed)
 eLink Physician-Brief Progress Note Patient Name: Renee [name] DOB: 1976/01/02 MRN: [id number]   Date of Service  11/26/2023  HPI/Events of Note  Hypotensive on neosynephrine   eICU Interventions  Bolus 1 liter LR now     Intervention Category Intermediate Interventions: Hypotension - evaluation and management  CLAUDENE AGENT, P 11/26/2023, 11:05 PM

## 2023-11-26 NOTE — Progress Notes (Addendum)
 PROGRESS NOTE  Anna Beaird  FMW:[id number] DOB: Nov 10, 1975 DOA: 11/25/2023 PCP: Pcp, No   Brief Narrative: Patient is a 48 female with history of hypertension, hyperlipidemia, hypothyroidism, anxiety, bipolar disorder, ADD, PTSD,  chronic pain, status post enucleation of the left eye ,fibromyalgia, history of bariatric surgery presented with a hand cramp.  History of recurrent hypokalemia ever since her bariatric surgery.  She takes potassium supplementation at home but recently ran out of it and cannot afford any medications.  Currently not taking any medication at home.  On presentation ,she was hemodynamically stable.  Lab work showed potassium of 2.7, magnesium  of 1.1, calcium 5.1, ALP of 233, WBC count of 13.4.  Given magnesium , potassium supplementation.  Assessment & Plan:  Principal Problem:   Hypocalcemia Active Problems:   Hypokalemia   Hypothyroidism   Chronic pain syndrome   Hypertension   PTSD (post-traumatic stress disorder)   Morbid obesity (HCC)   Fibromyalgia   Bipolar disorder, in partial remission, most recent episode depressed (HCC)   Gout, chronic   Generalized anxiety disorder   Hyperlipidemia   Hypomagnesemia  Hypocalcemia/hypokalemia/hypomagnesemia/hypophosphatemia: Recurrent problem.  Started after  bariatric surgery.  Presented with hand cramp.  Severely low electrolytes.  Currently being monitored and supplemented.  Pending PTH. vitamin D  less than 4.  Continue aggressive supplementation.  Checking am cortisol  Severe hypothyroidism: Has chronic hypothyroidism.  TSH of 147. Was supposed to take levothyroxine  at home but not taking because she cannot afford it.  Started on previous home dose.  Follow-up free T3, free T4  Prolonged QTc: Continue to monitor electrolytes.  Monitor on telemetry.  Will repeat EKG.  History of hypertension: Not taking any medication at home.  Currently normotensive  PTSD/anxiety/bipolar disorder/ADD: Was taking addrell.  But  not taking any medication currently  Elevated ammonia level/elevated liver enzymes: Likely chronic problem.  Right upper quadrant ultrasound done on 2024 showed gallbladder sludge without evidence of cholelithiasis or acute cholecystitis,hepatic steatosis without focal liver lesions.  Will check repeat right upper quadrant ultrasound.  Started on lactulose  Leukocytosis: Unclear etiology.  No signs of infectious process.  Continue to monitor  Debility/deconditioning/malnutrition: Patient looks extremely malnourished, deconditioned.  Dietitian consulted.  PT consulted.  She lives with her boyfriend.  Declines currently smoking, alcohol  intake or drugs  Addendum: Patient complained of severe abd pain this afternoon. Ordered CT abd stat.Concerning for ischemic bowel vs SBO.Scan show pneumatosis. Started iv fliud,obtaining lactate.Will keep her NPO.General surgery consulted,discussed with Dr Belinda.      DVT prophylaxis:enoxaparin (LOVENOX) injection 40 mg Start: 11/25/23 2200     Code Status: Full Code  Family Communication: None at bedside  Patient status:Inpatient  Patient is from :Home  Anticipated discharge un:Ynfz  Estimated DC date:2-3 days   Consultants: None  Procedures:None  Antimicrobials:  Anti-infectives (From admission, onward)    None       Subjective: Patient seen and examined at the bedside today.  Hemodynamically stable.  Appears overall comfortable.  Looks very deconditioned, malodorous, weak.  She says she feels better today.  Denies any nausea, vomiting or cramps.  Objective: Vitals:   11/26/23 0400 11/26/23 0500 11/26/23 0600 11/26/23 0700  BP: 100/70 102/73 109/78 124/83  Pulse: 61 65 64 61  Resp: 19 11 13 11   Temp:      TempSrc:      SpO2: 99% 100% 99% 100%  Weight:      Height:        Intake/Output Summary (Last 24 hours) at  11/26/2023 0741 Last data filed at 11/26/2023 0618 Gross per 24 hour  Intake 1884.69 ml  Output --  Net 1884.69  ml   Filed Weights   11/25/23 1258 11/25/23 2100  Weight: 70.3 kg 64.4 kg    Examination:  General exam: Overall comfortable, not in distress, weak and deconditioned, malnourished HEENT: Enucleation of the left eye Respiratory system:  no wheezes or crackles  Cardiovascular system: S1 & S2 heard, RRR.  Gastrointestinal system: Abdomen is nondistended, soft and nontender. Central nervous system: Alert and oriented Extremities: No edema, no clubbing ,no cyanosis Skin: No rashes, no ulcers,no icterus     Data Reviewed: I have personally reviewed following labs and imaging studies  CBC: Recent Labs  Lab 11/25/23 1320 11/26/23 0305  WBC 13.4* 13.1*  NEUTROABS 7.0  --   HGB 11.1* 10.5*  HCT 32.7* 32.7*  MCV 87.9 91.9  PLT 204 186   Basic Metabolic Panel: Recent Labs  Lab 11/25/23 1320 11/25/23 1437 11/25/23 1529 11/25/23 1923 11/25/23 2307 11/26/23 0305  NA 139  --  141 142 139 139  K 2.7*  --  2.8* 2.2* 3.3* 3.2*  CL 107  --  111 114* 109 110  CO2 20*  --  19* 19* 21* 18*  GLUCOSE 94  --  72 97 92 79  BUN 7  --  7 6 7 7   CREATININE 0.75  --  0.66 0.62 0.70 0.70  CALCIUM 5.1*  --  4.7* 4.6* 6.5* 6.5*  MG 1.1*  --   --   --  1.9  --   PHOS  --  <1.0*  --   --  1.3*  --      Recent Results (from the past 240 hours)  MRSA Next Gen by PCR, Nasal     Status: None   Collection Time: 11/25/23 12:06 AM   Specimen: Nasal Mucosa; Nasal Swab  Result Value Ref Range Status   MRSA by PCR Next Gen NOT DETECTED NOT DETECTED Final    Comment: (NOTE) The GeneXpert MRSA Assay (FDA approved for NASAL specimens only), is one component of a comprehensive MRSA colonization surveillance program. It is not intended to diagnose MRSA infection nor to guide or monitor treatment for MRSA infections. Test performance is not FDA approved in patients less than 53 years old. Performed at [facility], [address] [place], [place] [address]      Radiology  Studies: No results found.  Scheduled Meds:  Chlorhexidine  Gluconate Cloth  6 each Topical Daily   enoxaparin (LOVENOX) injection  40 mg Subcutaneous Q24H   potassium chloride   40 mEq Oral Once   sodium chloride  flush  3 mL Intravenous Q12H   Continuous Infusions:  calcium gluconate 930 mg of elemental calcium in sodium chloride  0.9 % 1,000 mL infusion 1 mg/kg/hr of elemental calcium (11/26/23 0618)     LOS: 1 day   Ivonne Mustache, MD [facility] P11/02/2023, 7:41 AM

## 2023-11-27 ENCOUNTER — Encounter (HOSPITAL_COMMUNITY): Payer: Self-pay | Admitting: Surgery

## 2023-11-27 ENCOUNTER — Inpatient Hospital Stay (HOSPITAL_COMMUNITY)

## 2023-11-27 DIAGNOSIS — D649 Anemia, unspecified: Secondary | ICD-10-CM

## 2023-11-27 DIAGNOSIS — R579 Shock, unspecified: Secondary | ICD-10-CM | POA: Diagnosis not present

## 2023-11-27 DIAGNOSIS — J9601 Acute respiratory failure with hypoxia: Secondary | ICD-10-CM | POA: Diagnosis not present

## 2023-11-27 DIAGNOSIS — K55059 Acute (reversible) ischemia of intestine, part and extent unspecified: Secondary | ICD-10-CM | POA: Diagnosis not present

## 2023-11-27 LAB — TYPE AND SCREEN

## 2023-11-27 LAB — COMPREHENSIVE METABOLIC PANEL WITH GFR
ALT: 32 U/L (ref 0–44)
AST: 56 U/L — ABNORMAL HIGH (ref 15–41)
Albumin: 2.7 g/dL — ABNORMAL LOW (ref 3.5–5.0)
Alkaline Phosphatase: 136 U/L — ABNORMAL HIGH (ref 38–126)
Anion gap: 10 (ref 5–15)
BUN: 8 mg/dL (ref 6–20)
CO2: 24 mmol/L (ref 22–32)
Calcium: 5.5 mg/dL — CL (ref 8.9–10.3)
Chloride: 114 mmol/L — ABNORMAL HIGH (ref 98–111)
Creatinine, Ser: 0.45 mg/dL (ref 0.44–1.00)
GFR, Estimated: >60 mL/min (ref >60)
Glucose, Bld: 106 mg/dL — ABNORMAL HIGH (ref 70–99)
Potassium: 2.9 mmol/L — ABNORMAL LOW (ref 3.5–5.1)
Sodium: 149 mmol/L — ABNORMAL HIGH (ref 135–145)
Total Bilirubin: 0.5 mg/dL (ref 0.0–1.2)
Total Protein: 3.9 g/dL — ABNORMAL LOW (ref 6.5–8.1)

## 2023-11-27 LAB — IRON AND TIBC
Iron: 34 ug/dL (ref 28–170)
Saturation Ratios: 30 % (ref 10.4–31.8)
TIBC: 114 ug/dL — ABNORMAL LOW (ref 250–450)
UIBC: 80 ug/dL

## 2023-11-27 LAB — GLUCOSE, CAPILLARY
Glucose-Capillary: 135 mg/dL — ABNORMAL HIGH (ref 70–99)
Glucose-Capillary: 132 mg/dL — ABNORMAL HIGH (ref 70–99)
Glucose-Capillary: 120 mg/dL — ABNORMAL HIGH (ref 70–99)

## 2023-11-27 LAB — C-REACTIVE PROTEIN: CRP: 3.4 mg/dL — ABNORMAL HIGH (ref <1.0)

## 2023-11-27 LAB — CBC
HCT: 24.8 % — ABNORMAL LOW (ref 36.0–46.0)
Hemoglobin: 8.7 g/dL — ABNORMAL LOW (ref 12.0–15.0)
MCH: 30.6 pg (ref 26.0–34.0)
MCHC: 35.1 g/dL (ref 30.0–36.0)
MCV: 87.3 fL (ref 80.0–100.0)
Platelets: 136 K/uL — ABNORMAL LOW (ref 150–400)
RBC: 2.84 MIL/uL — ABNORMAL LOW (ref 3.87–5.11)
RDW: 18.9 % — ABNORMAL HIGH (ref 11.5–15.5)
WBC: 5.3 K/uL (ref 4.0–10.5)
nRBC: 0 % (ref 0.0–0.2)

## 2023-11-27 LAB — TRIGLYCERIDES: Triglycerides: 54 mg/dL (ref <150)

## 2023-11-27 LAB — FERRITIN: Ferritin: 83 ng/mL (ref 11–307)

## 2023-11-27 LAB — T3, FREE: T3, Free: 0.5 pg/mL — ABNORMAL LOW (ref 2.0–4.4)

## 2023-11-27 LAB — AMMONIA: Ammonia: 53 umol/L — ABNORMAL HIGH (ref 9–35)

## 2023-11-27 LAB — VITAMIN B12: Vitamin B-12: 570 pg/mL (ref 180–914)

## 2023-11-27 LAB — CALCIUM, IONIZED: Calcium, Ionized, Serum: <3 mg/dL — ABNORMAL LOW (ref 4.5–5.6)

## 2023-11-27 MED ORDER — CALCIUM GLUCONATE-NACL 2-0.675 GM/100ML-% IV SOLN
2.0000 g | Freq: Once | INTRAVENOUS | Status: AC
  Administered 2023-11-27: 2000 mg via INTRAVENOUS
  Filled 2023-11-27: qty 100

## 2023-11-27 MED ORDER — SODIUM BICARBONATE 8.4 % IV SOLN
100.0000 meq | Freq: Once | INTRAVENOUS | Status: AC
  Administered 2023-11-27: 100 meq via INTRAVENOUS
  Filled 2023-11-27: qty 100

## 2023-11-27 MED ORDER — MAGNESIUM SULFATE 2 GM/50ML IV SOLN
2.0000 g | Freq: Once | INTRAVENOUS | Status: AC
  Administered 2023-11-27: 2 g via INTRAVENOUS
  Filled 2023-11-27: qty 50

## 2023-11-27 MED ORDER — LACTATED RINGERS IV BOLUS
1000.0000 mL | Freq: Once | INTRAVENOUS | Status: AC
  Administered 2023-11-27: 1000 mL via INTRAVENOUS

## 2023-11-27 MED ORDER — FOLIC ACID 5 MG/ML IJ SOLN
1.0000 mg | Freq: Every day | INTRAMUSCULAR | Status: DC
  Administered 2023-11-27 – 2023-12-01 (×5): 1 mg via INTRAVENOUS
  Filled 2023-11-27 (×6): qty 0.2

## 2023-11-27 MED ORDER — NOREPINEPHRINE 4 MG/250ML-% IV SOLN
0.0000 ug/min | INTRAVENOUS | Status: DC
  Administered 2023-11-27: 16 ug/min via INTRAVENOUS
  Administered 2023-11-27 (×2): 26 ug/min via INTRAVENOUS
  Administered 2023-11-27 (×3): 28 ug/min via INTRAVENOUS
  Administered 2023-11-28: 32 ug/min via INTRAVENOUS
  Administered 2023-11-28 (×2): 34 ug/min via INTRAVENOUS
  Administered 2023-11-28: 28 ug/min via INTRAVENOUS
  Administered 2023-11-28: 34 ug/min via INTRAVENOUS
  Filled 2023-11-27 (×10): qty 250

## 2023-11-27 MED ORDER — ORAL CARE MOUTH RINSE: 15.0000 mL | OROMUCOSAL | Status: DC | PRN

## 2023-11-27 MED ORDER — NOREPINEPHRINE 4 MG/250ML-% IV SOLN
0.0000 ug/min | INTRAVENOUS | Status: DC
  Administered 2023-11-27: 2 ug/min via INTRAVENOUS
  Filled 2023-11-27 (×2): qty 250

## 2023-11-27 MED ORDER — POTASSIUM CHLORIDE 10 MEQ/100ML IV SOLN
10.0000 meq | INTRAVENOUS | Status: AC
  Administered 2023-11-27 (×6): 10 meq via INTRAVENOUS
  Filled 2023-11-27 (×6): qty 100

## 2023-11-27 MED ORDER — POTASSIUM CHLORIDE IN NACL 20-0.9 MEQ/L-% IV SOLN
INTRAVENOUS | Status: DC
  Filled 2023-11-27 (×16): qty 1000

## 2023-11-27 MED ORDER — ORAL CARE MOUTH RINSE
15.0000 mL | OROMUCOSAL | Status: DC
  Administered 2023-11-27 – 2023-11-29 (×29): 15 mL via OROMUCOSAL

## 2023-11-27 MED ORDER — DEXTROSE 5 % IV BOLUS
250.0000 mL | Freq: Once | INTRAVENOUS | Status: AC
  Administered 2023-11-27: 250 mL via INTRAVENOUS

## 2023-11-27 MED ORDER — LEVOTHYROXINE SODIUM 100 MCG/5ML IV SOLN: 150.0000 ug | Freq: Every day | INTRAVENOUS | Status: DC

## 2023-11-27 MED ORDER — SODIUM CHLORIDE 0.9 % IV SOLN: 250.0000 mL | INTRAVENOUS | Status: AC

## 2023-11-27 NOTE — Procedures (Signed)
 Central Venous Catheter Insertion Procedure Note  Renee [name]  [id number]  01-09-1976  Date:11/27/23  Time:5:35 AM   Provider Performing:Doyce Stonehouse Layman   Procedure: Insertion of Non-tunneled Central Venous Catheter(36556) with US  guidance (23062)   Indication(s) Medication administration  Consent Unable to obtain consent due to emergent nature of procedure.  Anesthesia See mar  Timeout Verified patient identification, verified procedure, site/side was marked, verified correct patient position, special equipment/implants available, medications/allergies/relevant history reviewed, required imaging and test results available.  Sterile Technique Maximal sterile technique including full sterile barrier drape, hand hygiene, sterile gown, sterile gloves, mask, hair covering, sterile ultrasound probe cover (if used).  Procedure Description Area of catheter insertion was cleaned with chlorhexidine  and draped in sterile fashion.  With real-time ultrasound guidance a central venous catheter was placed into the right internal jugular vein. Nonpulsatile blood flow and easy flushing noted in all ports.  The catheter was sutured in place and sterile dressing applied.  Complications/Tolerance None; patient tolerated the procedure well. Chest X-ray is ordered to verify placement for internal jugular or subclavian cannulation.   Chest x-ray is not ordered for femoral cannulation.  EBL Minimal  Specimen(s) None

## 2023-11-27 NOTE — Progress Notes (Signed)
 Reviewed cxr for cvc placement. Will retract to 14cm in hopes that the tip of line will angle in correct direction svc.  Ok to use cvc now that repositioned. Repeat cxr pending.

## 2023-11-27 NOTE — Discharge Instructions (Signed)
 FOOD PANTRY [employer] [address] [phone]  [facility] [employer] [address] [place] [phone]  [facility] Distribution [facility] [address] [place] [phone]  [facility] [address] [phone]  [facility] - Food Distribution Center [address] [place], [place] [address] [phone]  UTILITIES [facility] [address] [place] [phone] Rental assistance/rental hotline: [phone] ext. 340.    Utility assistance/utility hotline: [phone] ext. [address] Department of IT consultant (heating/cooling and water assistance) [phone] (rental and utility assistance) [phone]  [employer] - call 211

## 2023-11-27 NOTE — Progress Notes (Signed)
 eLink Physician-Brief Progress Note Patient Name: Renee [name] DOB: Nov 30, 1975 MRN: [id number]   Date of Service  11/27/2023  HPI/Events of Note  Borderline BP.  Good UOP  eICU Interventions  LR bolus Add peripheral levophed and titrate peripheral neo down to off as able     Intervention Category Intermediate Interventions: Hypotension - evaluation and management  CLAUDENE AGENT, P 11/27/2023, 1:10 AM

## 2023-11-27 NOTE — Progress Notes (Signed)
 OG tube placed  Advanced endotracheal tube by 2 cm

## 2023-11-27 NOTE — Progress Notes (Signed)
 Initial Nutrition Assessment  DOCUMENTATION CODES:   Non-severe (moderate) malnutrition in context of chronic illness  INTERVENTION:  - If able to start enteral nutrition, would recommend Vital 1.5 @ 75mL/hr  - If unable to initiate enteral nutrition in the next 1-2 days, would recommend initiation of TPN.  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  GOAL:   Patient will meet greater than or equal to 90% of their needs  MONITOR:   Vent status, Labs, Weight trends  REASON FOR ASSESSMENT:   Consult Assessment of nutrition requirement/status (severe malnutrition; history of gastric bypass convered to duodenal switch and has not been taking vitamins)  ASSESSMENT:   48 y.o. 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 Admitted for low electrolytes and low calcium.   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  Per EMR, no weight history within the past year to assess recent changes.   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  Surgery requesting RD to assess further vitamin labs to order as patient has not been taking vitamins at home. She has a MVI, folic acid , and thiamine  listed in her home meds but question if patient has been taking these.  Per RD notes from [facility] admission in February 2024 show patient was deficient in vitamin D , copper , selenium, Vitamin A , and vitamin C. Surgery has already ordered vitamin D  (low, on repletion), vitamin B12 (WNL), and iron (WNL). Vitamin A , E, K, thiamine , zinc , and copper  are currently pending.  As patient has a history of vitamin C and selenium deficiency, will also check these labs. Given that elevated CRP level can show falsely low levels, will also check this.   NGT placed 11/2 and per xray was looped in the pharynx. New OGT placed today and xray verified in the stomach.   Patient has an open abdomen with wound vac at this time. Per surgery, plan to return to OR tomorrow for a second look laparotomy, possible bowel resection, and possible abdominal closure. Surgery's note indicates will likely need TPN pending OR findings tomorrow.    Medications reviewed and include: Calcium cirtrate TID, 1mg  folic acid , 100mg  thiamine , 50,000 units vitamin D  weekly Fentanyl  Levophed @ 28mcg/min Neo @ 50mcg/min Propofol  @ 23.42mL/hr (provides 612 kcals over 24 hours)  Labs reviewed:  Na 149 K+ 2.9  Vitamins/Minerals: Vitamin D : < 4.2 (LOW) Vitamin B12: 570 (  WNL) Iron: WNL Vitamin A : pending Vitamin E: pending Vitamin K: pending Thiamine : pending Zinc : pending Copper : pending  NUTRITION - FOCUSED PHYSICAL EXAM:  Flowsheet Row Most Recent Value  Orbital Region Severe depletion  Upper Arm Region Moderate depletion  Thoracic and Lumbar Region Unable  to assess  Buccal Region Unable to assess  Temple Region Severe depletion  Clavicle Bone Region Moderate depletion  Clavicle and Acromion Bone Region Mild depletion  Scapular Bone Region Unable to assess  Dorsal Hand No depletion  Patellar Region Mild depletion  Anterior Thigh Region Mild depletion  Posterior Calf Region Moderate depletion  Edema (RD Assessment) None  Hair Reviewed  Eyes Unable to assess  Mouth Unable to assess  Skin Reviewed  Nails Reviewed    Diet Order:   Diet Order             Diet NPO time specified  Diet effective now                   EDUCATION NEEDS:  Not appropriate for education at this time  Skin:  Skin Assessment: Skin Integrity Issues: Skin Integrity Issues:: Incisions Incisions: Abdomen  Last BM:  11/2 - type 7  Height:  Ht Readings from Last 1 Encounters:  11/26/23 5' 7 (1.702 m)   Weight:  Wt Readings from Last 1 Encounters:  11/25/23 64.4 kg   Ideal Body Weight:  61.36 kg  BMI:  Body mass index is 22.24 kg/m.  Estimated Nutritional Needs:  Kcal:  1800-2100 kcals Protein:  90-115 grams Fluid:  >/= 1.8L    [name] RD, LDN Contact via Secure Chat.]

## 2023-11-27 NOTE — Progress Notes (Addendum)
 [facility] Progress Note  1 Day Post-Op  Subjective: CC:  Intubated, sedated and unresponsive.   Afebrile On two pressors ( levo 9 mcg, neo 170 mcg) NG has been clamped/non-functional, RN about to remove and place OG. BUN/Cr are WNL and UOP > 2L  No family at bedside Objective: Vital signs in last 24 hours: Temp:  [93.4 F (34.1 C)-99.1 F (37.3 C)] 97.7 F (36.5 C) (11/03 0745) Pulse Rate:  [51-74] 61 (11/03 0745) Resp:  [12-19] 18 (11/03 0745) BP: (86-122)/(61-92) 105/74 (11/02 1745) SpO2:  [96 %-100 %] 100 % (11/03 0745) Arterial Line BP: (78-113)/(45-65) 106/61 (11/03 0745) FiO2 (%):  [40 %-60 %] 40 % (11/03 0809) Last BM Date : 11/26/23  Intake/Output from previous day: 11/02 0701 - 11/03 0700 In: 6902.7 [I.V.:2766.9; IV Piggyback:4135.7] Out: 3650 [Urine:2375; Drains:1275] Intake/Output this shift: Total I/O In: 1249.1 [I.V.:1249.1] Out: -   PE: Gen:  ill appearing, intubated, sedated Card:  Regular rate and rhythm Pulm: ventilated respirations Abd: Soft, non-tender, non-distended, abthera VAC in place holding suction, SS fluid in cannister (1,200 mL) Skin: warm and dry, no rashes  Psych: unable to assess  Lab Results:  Recent Labs    11/26/23 2219 11/27/23 0338  WBC 6.8 5.3  HGB 9.1* 8.7*  HCT 27.1* 24.8*  PLT 136* 136*   BMET Recent Labs    11/26/23 2037 11/27/23 0338  NA 142 149*  K 3.2* 2.9*  CL 114* 114*  CO2 19* 24  GLUCOSE 114* 106*  BUN 7 8  CREATININE 0.47 0.45  CALCIUM 6.0* 5.5*   PT/INR Recent Labs    11/25/23 1320 11/26/23 1733  LABPROT 18.4* 18.3*  INR 1.4* 1.4*   CMP     Component Value Date/Time   NA 149 (H) 11/27/2023 0338   NA 137 04/22/2019 1634   K 2.9 (L) 11/27/2023 0338   CL 114 (H) 11/27/2023 0338   CO2 24 11/27/2023 0338   GLUCOSE 106 (H) 11/27/2023 0338   BUN 8 11/27/2023 0338   BUN 8 04/22/2019 1634   CREATININE 0.45 11/27/2023 0338   CREATININE 0.73 08/02/2016 1237   CALCIUM 5.5  (LL) 11/27/2023 0338   PROT 3.9 (L) 11/27/2023 0338   PROT 5.4 (L) 04/22/2019 1634   ALBUMIN 2.7 (L) 11/27/2023 0338   ALBUMIN 3.0 (L) 04/22/2019 1634   AST 56 (H) 11/27/2023 0338   ALT 32 11/27/2023 0338   ALKPHOS 136 (H) 11/27/2023 0338   BILITOT 0.5 11/27/2023 0338   BILITOT 0.3 04/22/2019 1634   GFRNONAA >60 11/27/2023 0338   GFRAA 80 04/22/2019 1634   Lipase     Component Value Date/Time   LIPASE 82 (H) 03/08/2022 1940       Studies/Results: DG CHEST PORT 1 VIEW Result Date: 11/27/2023 EXAM: 1 VIEW(S) XRAY OF THE CHEST 11/27/2023 06:36:00 AM COMPARISON: 11/27/2023 CLINICAL HISTORY: Encounter for central line placement FINDINGS: LINES, TUBES AND DEVICES: Right internal jugular central venous catheter in place with tip in expected region of right distal axillary vein. Stable endotracheal tube with tip 4.5 cm above carina. Enteric tube in place with tip in expected region of mid esophagus. The esophageal temperature probe tip remains in place of the lower neck. LUNGS AND PLEURA: Low lung volumes. Bibasilar atelectasis. No focal pulmonary opacity. No pulmonary edema. No pleural effusion. No pneumothorax. HEART AND MEDIASTINUM: No acute abnormality of the cardiac and mediastinal silhouettes. BONES AND SOFT TISSUES: No acute osseous abnormality. IMPRESSION: 1. Persistent low lung volumes with bibasilar  atelectasis. 2. Malpositioned enteric tube with tip projecting over the mid esophagus; recommend advancement. 3. The tip of the esophageal temperature probe remains in the lower neck region. 4. The right IJ catheter tip has been retracted and is now within the distal axillary vein. Recommend repositioning. 5. The urgent finding will be called to the ordering provider by the Professional Radiology Assistants (PRAs) and documented in the [facility] dashboard. Electronically signed by: Waddell Calk MD 11/27/2023 07:09 AM EST RP Workstation: [id number]   DG CHEST PORT 1 VIEW Result Date:  11/27/2023 EXAM: 1 VIEW(S) XRAY OF THE CHEST 11/27/2023 05:53:00 AM COMPARISON: 03/07/2022 CLINICAL HISTORY: Encounter for central line placement FINDINGS: LINES, TUBES AND DEVICES: Right IJ central catheter has been placed with tip in right axillary vein. Endotracheal tube in place with tip 4.5 cm above carina. Enteric tube in place with tip in mid esophagus. LUNGS AND PLEURA: Low lung volumes with bibasilar atelectasis. No focal pulmonary opacity. No pulmonary edema. No pleural effusion. No pneumothorax. HEART AND MEDIASTINUM: No acute abnormality of the cardiac and mediastinal silhouettes. BONES AND SOFT TISSUES: No acute osseous abnormality. IMPRESSION: 1. Malpositioned right IJ central venous catheter with tip in the right axillary vein; recommend repositioning or exchange as clinically appropriate. 2. Enteric tube malpositioned with tip in the mid esophagus; recommend advancement into the stomach. 3. Endotracheal tube tip 4.5 cm above the carina, appropriate in position. 4. The urgent finding will be called to the ordering provider by the Professional Radiology Assistants (PRAs) and documented in the [facility] dashboard. Electronically signed by: Waddell Calk MD 11/27/2023 06:00 AM EST RP Workstation: [id number]   CT ABDOMEN PELVIS W CONTRAST Result Date: 11/26/2023 CLINICAL DATA:  Severe abdominal pain, nonlocalized. EXAM: CT ABDOMEN AND PELVIS WITH CONTRAST TECHNIQUE: Multidetector CT imaging of the abdomen and pelvis was performed using the standard protocol following bolus administration of intravenous contrast. RADIATION DOSE REDUCTION: This exam was performed according to the departmental dose-optimization program which includes automated exposure control, adjustment of the mA and/or kV according to patient size and/or use of iterative reconstruction technique. CONTRAST:  OMNIPAQUE  IOHEXOL  300 MG/ML  SOLN COMPARISON:  03/08/2022. FINDINGS: Lower chest: Strandy atelectasis or infiltrate is present  at the lung bases. Hepatobiliary: The liver enhances heterogeneously and pneumobilia is noted. Extensive portal venous gas is noted in the liver and portal vein. No biliary ductal dilatation is seen. A 4 mm hyperdense structure is present within the gallbladder. Pancreas: Unremarkable. No pancreatic ductal dilatation or surrounding inflammatory changes. Spleen: Normal in size without focal abnormality. Adrenals/Urinary Tract: The adrenal glands are within normal limits. The kidneys enhance symmetrically. No renal calculus or hydronephrosis bilaterally. The bladder is unremarkable. Stomach/Bowel: Gastric surgical changes are noted. There is thickening of the walls of the distal esophagus. Multiple loops of dilated small bowel are noted in the abdomen measuring up to 4.8 cm. No definite transition point is seen. Patulous loops of small bowel are noted at anastomotic sites. There is questionable pneumatosis involving the small bowel. Appendix appears normal. A moderate amount of retained stool is present in the colon. Marked thickening of the walls of the rectum are noted. Vascular/Lymphatic: Extensive portal venous gas is noted with gas in the superior mesenteric vein extending into the mesentery. Aorta is normal in caliber. No abdominal or pelvic lymphadenopathy. Reproductive: Uterus and bilateral adnexa are unremarkable. Other: Mild ascites is noted and most pronounced in the perihepatic space. Anasarca is noted. Musculoskeletal: Degenerative changes are present in the thoracolumbar spine. No acute osseous  abnormality. IMPRESSION: 1. Multiple dilated loops of small bowel in the abdomen measuring up to 4.8 cm with possible pneumatosis. No obvious transition point is seen. Findings are concerning for ischemic bowel. 2. Extensive portal venous gas extending into the liver and mesentery. 3. Marked thickening of the walls of the rectum, suggesting proctitis. 4. Thickening of the walls of the distal esophagus, possible  infectious or inflammatory esophagitis. 5. Anasarca. Critical Value/emergent results were called by telephone at the time of interpretation on 11/26/2023 at 4:00 pm to provider [facility] , who verbally acknowledged these results. Electronically Signed   By: Leita Birmingham M.D.   On: 11/26/2023 16:00   US  Abdomen Limited RUQ (LIVER/GB) Result Date: 11/26/2023 EXAM: Right Upper Quadrant Abdominal Ultrasound 11/26/2023 09:18:00 AM COMPARISON: US  Abdomen 03/08/2022. CLINICAL HISTORY: Elevated liver enzymes. TECHNIQUE: Real-time ultrasonography of the right upper quadrant of the abdomen was performed. FINDINGS: LIVER: The liver demonstrates an extremely abnormal appearance. The portal triads are diffusely echogenic and the hepatic parenchyma is heterogeneous. No discrete masses are evident. BILIARY SYSTEM: The gallbladder and bile ducts are obscured. OTHER: No right upper quadrant ascites. IMPRESSION: 1. Extremely abnormal liver appearance with diffusely echogenic portal triads and heterogeneous hepatic parenchyma; no discrete hepatic mass is identified. 2. Gallbladder and bile ducts are obscured. 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 Winfred. She presented to the ED w/ muscle cramping and weakness followed by  acute onset abdominal pain. Workup significant for profound electrolyte derangements below as well as a CT scan showing portal venous gas and pneumatosis POD#1 s/p diagnostic laparoscopy converted to exploratory laparotomy, closure of internal hernia defect, colotomy for colonic decompression, placement of abthera wound VAC 11/2 Dr. Belinda  - intra-op findings: The alimentary limb has some patchy areas of partial-thickness ischemia without obvious necrosis  - tentative plan to return to the OR tomorrow for second look laparotomy, possible bowel resection, possible abdominal closure. I will discuss the procedure with the patients family today.  - appreciate CCM mgmt of shock and MMP   FEN: NPO, OG to LIWS, no meds per tube, IVF per primary, will likely need TPN pending OR findings tomorrow ID: Rocephin , Flagyl  VTE: SCD's, Lovenox 40 mg daily  Foley: in place since 11/2 Dispo: ICU, critical   Per CCM:  Acute respiratory failure Metabolic acidosis Hypokalemia Hypocalcemia Hypomagnesemia Elevated ammonia Hypothyroidism, severe Acute on chronic anemia HTN HLD Bipolar disoder PTSD ADD Gout Fibromyalgia   .   LOS: 2 days   I reviewed nursing notes, Consultant CCM notes, hospitalist notes, last 24 h vitals and pain scores, last 48 h intake and output, last 24 h labs and trends, and last 24 h imaging results.  This care required high  level of medical decision making.   Almarie Pringle, PA-C [facility] Please see Amion for pager number during day hours 7:00am-4:30pm
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
## 2023-11-28 ENCOUNTER — Inpatient Hospital Stay (HOSPITAL_COMMUNITY)

## 2023-11-28 ENCOUNTER — Inpatient Hospital Stay (HOSPITAL_COMMUNITY): Payer: Self-pay

## 2023-11-28 ENCOUNTER — Encounter (HOSPITAL_COMMUNITY)
Admission: EM | Disposition: A | Discharge status: Rehabilitation Facility | Payer: Self-pay | Source: Home / Self Care | Attending: Internal Medicine

## 2023-11-28 DIAGNOSIS — E440 Moderate protein-calorie malnutrition: Secondary | ICD-10-CM | POA: Insufficient documentation

## 2023-11-28 DIAGNOSIS — Z87891 Personal history of nicotine dependence: Secondary | ICD-10-CM | POA: Diagnosis not present

## 2023-11-28 DIAGNOSIS — K55019 Acute (reversible) ischemia of small intestine, extent unspecified: Secondary | ICD-10-CM

## 2023-11-28 DIAGNOSIS — K55059 Acute (reversible) ischemia of intestine, part and extent unspecified: Secondary | ICD-10-CM | POA: Diagnosis not present

## 2023-11-28 DIAGNOSIS — I10 Essential (primary) hypertension: Secondary | ICD-10-CM

## 2023-11-28 DIAGNOSIS — J9601 Acute respiratory failure with hypoxia: Secondary | ICD-10-CM | POA: Diagnosis not present

## 2023-11-28 DIAGNOSIS — R579 Shock, unspecified: Secondary | ICD-10-CM | POA: Diagnosis not present

## 2023-11-28 LAB — GLUCOSE, CAPILLARY
Glucose-Capillary: 105 mg/dL — ABNORMAL HIGH (ref 70–99)
Glucose-Capillary: 116 mg/dL — ABNORMAL HIGH (ref 70–99)
Glucose-Capillary: 117 mg/dL — ABNORMAL HIGH (ref 70–99)
Glucose-Capillary: 74 mg/dL (ref 70–99)
Glucose-Capillary: 83 mg/dL (ref 70–99)
Glucose-Capillary: 92 mg/dL (ref 70–99)

## 2023-11-28 LAB — CBC
HCT: 30.4 % — ABNORMAL LOW (ref 36.0–46.0)
Hemoglobin: 10 g/dL — ABNORMAL LOW (ref 12.0–15.0)
MCH: 29.1 pg (ref 26.0–34.0)
MCHC: 32.9 g/dL (ref 30.0–36.0)
MCV: 88.4 fL (ref 80.0–100.0)
Platelets: 106 K/uL — ABNORMAL LOW (ref 150–400)
RBC: 3.44 MIL/uL — ABNORMAL LOW (ref 3.87–5.11)
RDW: 19.5 % — ABNORMAL HIGH (ref 11.5–15.5)
WBC: 6.8 K/uL (ref 4.0–10.5)
nRBC: 0 % (ref 0.0–0.2)

## 2023-11-28 LAB — BASIC METABOLIC PANEL WITH GFR
Anion gap: 6 (ref 5–15)
BUN: 9 mg/dL (ref 6–20)
CO2: 18 mmol/L — ABNORMAL LOW (ref 22–32)
Calcium: 5 mg/dL — CL (ref 8.9–10.3)
Chloride: 119 mmol/L — ABNORMAL HIGH (ref 98–111)
Creatinine, Ser: 0.4 mg/dL — ABNORMAL LOW (ref 0.44–1.00)
GFR, Estimated: >60 mL/min (ref >60)
Glucose, Bld: 127 mg/dL — ABNORMAL HIGH (ref 70–99)
Potassium: 3.4 mmol/L — ABNORMAL LOW (ref 3.5–5.1)
Sodium: 143 mmol/L (ref 135–145)

## 2023-11-28 LAB — PHOSPHORUS
Phosphorus: 1 mg/dL — CL (ref 2.5–4.6)
Phosphorus: 2.5 mg/dL (ref 2.5–4.6)

## 2023-11-28 LAB — CALCIUM, IONIZED: Calcium, Ionized, Serum: 3.7 mg/dL — ABNORMAL LOW (ref 4.5–5.6)

## 2023-11-28 LAB — MAGNESIUM
Magnesium: 1.2 mg/dL — ABNORMAL LOW (ref 1.7–2.4)
Magnesium: 1.4 mg/dL — ABNORMAL LOW (ref 1.7–2.4)

## 2023-11-28 MED ORDER — MIDAZOLAM HCL (PF) 2 MG/2ML IJ SOLN
INTRAMUSCULAR | Status: DC | PRN
  Administered 2023-11-28: 2 mg via INTRAVENOUS

## 2023-11-28 MED ORDER — ROCURONIUM BROMIDE 100 MG/10ML IV SOLN
INTRAVENOUS | Status: DC | PRN
  Administered 2023-11-28 (×2): 100 mg via INTRAVENOUS

## 2023-11-28 MED ORDER — MIDAZOLAM HCL 2 MG/2ML IJ SOLN
INTRAMUSCULAR | Status: AC
Start: 2023-11-28 — End: 2023-11-28
  Filled 2023-11-28: qty 2

## 2023-11-28 MED ORDER — NOREPINEPHRINE 16 MG/250ML-% IV SOLN
0.0000 ug/min | INTRAVENOUS | Status: DC
  Administered 2023-11-28: 40 ug/min via INTRAVENOUS
  Administered 2023-11-28: 34 ug/min via INTRAVENOUS
  Administered 2023-11-29: 11 ug/min via INTRAVENOUS
  Administered 2023-11-29: 30 ug/min via INTRAVENOUS
  Filled 2023-11-28 (×4): qty 250

## 2023-11-28 MED ORDER — MAGNESIUM SULFATE 4 GM/100ML IV SOLN
4.0000 g | Freq: Once | INTRAVENOUS | Status: AC
  Administered 2023-11-28: 4 g via INTRAVENOUS
  Filled 2023-11-28: qty 100

## 2023-11-28 MED ORDER — POTASSIUM PHOSPHATES 15 MMOLE/5ML IV SOLN
30.0000 mmol | Freq: Once | INTRAVENOUS | Status: AC
  Administered 2023-11-28: 30 mmol via INTRAVENOUS
  Filled 2023-11-28: qty 10

## 2023-11-28 MED ORDER — CALCIUM GLUCONATE 10 % IV SOLN
4.0000 g | Freq: Once | INTRAVENOUS | Status: AC
  Administered 2023-11-28: 4 g via INTRAVENOUS
  Filled 2023-11-28: qty 40

## 2023-11-28 MED ORDER — ALBUMIN HUMAN 25 % IV SOLN
25.0000 g | Freq: Once | INTRAVENOUS | Status: AC
  Administered 2023-11-28: 25 g via INTRAVENOUS
  Filled 2023-11-28: qty 100

## 2023-11-28 MED ORDER — KETOROLAC TROMETHAMINE 30 MG/ML IJ SOLN
INTRAMUSCULAR | Status: AC
Start: 2023-11-28 — End: 2023-11-28
  Filled 2023-11-28: qty 1

## 2023-11-28 MED ORDER — STERILE WATER FOR INJECTION IJ SOLN
INTRAMUSCULAR | Status: AC
Start: 2023-11-28 — End: 2023-11-28
  Filled 2023-11-28: qty 10

## 2023-11-28 MED ORDER — SPY AGENT GREEN - (INDOCYANINE FOR INJECTION)
INTRAMUSCULAR | Status: DC | PRN
  Administered 2023-11-28: 2 mL via INTRAVENOUS

## 2023-11-28 MED ORDER — CALCIUM GLUCONATE 10 % IV SOLN
0.5000 mg/kg/h | INTRAVENOUS | Status: DC
  Administered 2023-11-28: 1 mg/kg/h via INTRAVENOUS
  Administered 2023-11-29: 0.5 mg/kg/h via INTRAVENOUS
  Administered 2023-11-29: 1 mg/kg/h via INTRAVENOUS
  Administered 2023-12-01: 0.5 mg/kg/h via INTRAVENOUS
  Filled 2023-11-28 (×5): qty 100

## 2023-11-28 MED ORDER — PROPOFOL 10 MG/ML IV BOLUS
INTRAVENOUS | Status: AC
  Filled 2023-11-28: qty 20

## 2023-11-28 MED ORDER — 0.9 % SODIUM CHLORIDE (POUR BTL) OPTIME
TOPICAL | Status: DC | PRN
  Administered 2023-11-28: 2000 mL

## 2023-11-28 MED ORDER — LACTATED RINGERS IV SOLN: INTRAVENOUS | Status: DC | PRN

## 2023-11-28 MED ORDER — VASOPRESSIN 20 UNITS/100 ML INFUSION FOR SHOCK
0.0000 [IU]/min | INTRAVENOUS | Status: DC
  Administered 2023-11-28 – 2023-11-29 (×3): 0.03 [IU]/min via INTRAVENOUS
  Filled 2023-11-28 (×3): qty 100
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
## 2023-11-28 NOTE — Plan of Care (Signed)
  Problem: Clinical Measurements: Goal: Respiratory complications will improve Outcome: Not Progressing Goal: Cardiovascular complication will be avoided Outcome: Progressing   Problem: Nutrition: Goal: Adequate nutrition will be maintained Outcome: Not Progressing   Problem: Elimination: Goal: Will not experience complications related to bowel motility Outcome: Not Progressing Goal: Will not experience complications related to urinary retention Outcome: Progressing

## 2023-11-28 NOTE — Transfer of Care (Signed)
 Immediate Anesthesia Transfer of Care Note  Patient: Renee [name]  Procedure(s) Performed: LAPAROTOMY, EXPLORATORY  Patient Location: ICU  Anesthesia Type:General  Level of Consciousness: drowsy, patient cooperative, and Patient remains intubated per anesthesia plan  Airway & Oxygen Therapy: Patient remains intubated per anesthesia plan and Patient placed on Ventilator (see vital sign flow sheet for setting)  Post-op Assessment: Report given to RN and Post -op Vital signs reviewed and stable  Post vital signs: Reviewed and stable  Last Vitals:  Vitals Value Taken Time  BP    Temp    Pulse    Resp    SpO2      Last Pain:  Vitals:   11/28/23 0739  TempSrc: Oral  PainSc:          Complications: No notable events documented.

## 2023-11-28 NOTE — Progress Notes (Signed)
  2 Days Post-Op   Chief Complaint/Subjective: Added vasopressor, increased pressor requirements  Objective: Vital signs in last 24 hours: Temp:  [97 F (36.1 C)-97.8 F (36.6 C)] 97.5 F (36.4 C) (11/04 0441) Pulse Rate:  [54-63] 59 (11/04 0000) Resp:  [15-19] 18 (11/04 0000) SpO2:  [98 %-100 %] 98 % (11/04 0000) Arterial Line BP: (90-112)/(56-70) 109/69 (11/04 0000) FiO2 (%):  [40 %] 40 % (11/04 0441) Last BM Date : 11/26/23 Intake/Output from previous day: 11/03 0701 - 11/04 0700 In: 10288.6 [I.V.:8949.4; IV Piggyback:1339.2] Out: 3925 [Urine:2750; Drains:1175]  PE: Gen: intubated sedated Resp: assisted Card: bradycardic Abd: abtherra in place with serosanguinous output  Lab Results:  Recent Labs    11/27/23 0338 11/28/23 0507  WBC 5.3 6.8  HGB 8.7* 10.0*  HCT 24.8* 30.4*  PLT 136* 106*   Recent Labs    11/27/23 0338 11/28/23 0507  NA 149* 143  K 2.9* 3.4*  CL 114* 119*  CO2 24 18*  GLUCOSE 106* 127*  BUN 8 9  CREATININE 0.45 0.40*  CALCIUM 5.5* 5.0*   Recent Labs    11/25/23 1320 11/26/23 1733  LABPROT 18.4* 18.3*  INR 1.4* 1.4*      Component Value Date/Time   NA 143 11/28/2023 0507   NA 137 04/22/2019 1634   K 3.4 (L) 11/28/2023 0507   CL 119 (H) 11/28/2023 0507   CO2 18 (L) 11/28/2023 0507   GLUCOSE 127 (H) 11/28/2023 0507   BUN 9 11/28/2023 0507   BUN 8 04/22/2019 1634   CREATININE 0.40 (L) 11/28/2023 0507   CREATININE 0.73 08/02/2016 1237   CALCIUM 5.0 (LL) 11/28/2023 0507   PROT 3.9 (L) 11/27/2023 0338   PROT 5.4 (L) 04/22/2019 1634   ALBUMIN 2.7 (L) 11/27/2023 0338   ALBUMIN 3.0 (L) 04/22/2019 1634   AST 56 (H) 11/27/2023 0338   ALT 32 11/27/2023 0338   ALKPHOS 136 (H) 11/27/2023 0338   BILITOT 0.5 11/27/2023 0338   BILITOT 0.3 04/22/2019 1634   GFRNONAA >60 11/28/2023 0507   GFRAA 80 04/22/2019 1634    Assessment/Plan  s/p Procedure(s): LAPAROSCOPY, DIAGNOSTIC LAPAROTOMY, EXPLORATORY APPLICATION, WOUND VAC 11/26/2023   -continue IV abx -replace lytes -continue vasopressors per PCCM -OR today for reexploration. I discussed the case with [name] by phone. We discussed goals of control of infection, possible removal of intestines, possible reconstruction, possible continuing open abdomen.   FEN - NPO, OG in position VTE - SCDs, lovenox 40 mg daily ID - zosyn Disposition - OR today   LOS: 3 days   I reviewed last 24 h vitals and pain scores, last 48 h intake and output, last 24 h labs and trends, and last 24 h imaging results.  This care required high  level of medical decision making.   Renee [name] [facility] 11/28/2023, 7:34 AM Please see Amion for pager number during day hours 7:00am-4:30pm or 7:00am -11:30am on weekends

## 2023-11-28 NOTE — Progress Notes (Signed)
 eLink Physician-Brief Progress Note Patient Name: Kaianna Dolezal DOB: [birth year] MRN: [id number]   Date of Service  11/28/2023  HPI/Events of Note  48 y.o. year old female with symptoms of concern for shock was taken to the OR 2 days ago with concern for patchy ischemia left open, persistent shock transition to norepinephrine and vasopressin  Reportedly patient's heart rate is 47-49 on fentanyl  and propofol .  Verbal discussion and summary provided by changing sedation due to bradycardia  eICU Interventions  Patient is bradycardic, but no changes to hemodynamics with reduction of propofol /fentanyl  doses  Ultimately, maintain heart rate greater than 40 and avoid heart block.  While maintaining sinus rhythm, no indication to change sedation at this time     Intervention Category Intermediate Interventions: Arrhythmia - evaluation and management  Daaron Dimarco 11/28/2023, 8:30 PM

## 2023-11-28 NOTE — Anesthesia Preprocedure Evaluation (Signed)
 Anesthesia Evaluation  Patient identified by MRN, date of birth, ID band Patient unresponsive  General Assessment Comment:  Mesenteric ischemia, s/p ex-lap, currently intubated and sedated in ICU with open abdomen. Currently on 3 pressors, s/p +6L in past 24hrs.   Reviewed: Allergy & Precautions, NPO status , Patient's Chart, lab work & pertinent test results, Unable to perform ROS - Chart review only  History of Anesthesia Complications Negative for: history of anesthetic complications  Airway Mallampati: Intubated  TM Distance: >3 FB     Dental  (+) Dental Advisory Given   Pulmonary sleep apnea , neg COPD, Patient abstained from smoking.Not current smoker, former smoker   Pulmonary exam normal breath sounds clear to auscultation       Cardiovascular METShypertension, (-) CAD and (-) Past MI (-) dysrhythmias  Rhythm:Regular Rate:Normal - Systolic murmurs    Neuro/Psych  Headaches PSYCHIATRIC DISORDERS Anxiety Depression Bipolar Disorder      GI/Hepatic ,GERD  ,,(+)     (-) substance abuse    Endo/Other  neg diabetesHypothyroidism    Renal/GU negative Renal ROS     Musculoskeletal   Abdominal   Peds  Hematology  (+) Blood dyscrasia, anemia   Anesthesia Other Findings Past Medical History: No date: ADD (attention deficit disorder with hyperactivity) No date: Allergy No date: Anemia No date: Anxiety and depression     Comment:  followed by Dr. Vincente and Dwayne at [facility] No date: Bipolar disorder [facility])     Comment:  sees Dr. Emilio Vincente No date: Chronic nausea No date: Chronic pain 02/24/2015: DDD (degenerative disc disease), cervical     Comment:  C4 foraminal narrowing-chronic pain  No date: Dermatitis No date: Edema No date: Fibromyalgia No date: GERD (gastroesophageal reflux disease) No date: Hearing difficulty No date: Herpes simplex No date: Hypertension No date: Hypothyroidism No date:  Migraines 01/26/2018: OSA (obstructive sleep apnea)     Comment:  HST 02/08/18 AHI 1.0 is not diagnostic of obstructive               sleep apnea No date: Polyarthralgia 03/09/2022: Sepsis secondary to UTI (HCC) No date: Skin abnormalities     Comment:  sees [facility] No date: Urinary incontinence  Reproductive/Obstetrics                              Anesthesia Physical Anesthesia Plan  ASA: 4  Anesthesia Plan: General   Post-op Pain Management: Ofirmev  IV (intra-op)*   Induction: Intravenous  PONV Risk Score and Plan: 4 or greater and Ondansetron , Dexamethasone  and Treatment may vary due to age or medical condition  Airway Management Planned: Oral ETT  Additional Equipment: Arterial line  Intra-op Plan:   Post-operative Plan: Post-operative intubation/ventilation  Informed Consent: I have reviewed the patients History and Physical, chart, labs and discussed the procedure including the risks, benefits and alternatives for the proposed anesthesia with the patient or authorized representative who has indicated his/her understanding and acceptance.     Dental advisory given and Consent reviewed with POA  Plan Discussed with: CRNA and Surgeon  Anesthesia Plan Comments: (Discussed risks of anesthesia with patient's mother Melessa by phone, including PONV, sore throat, lip/dental/eye damage. Rare risks discussed as well, such as cardiorespiratory and neurological sequelae, and allergic reactions. Mother counseled on patient being higher risk for anesthesia due to comorbidities: septic shock, mesenteric ischemia. She was told about increased risk of cardiac and respiratory events, including death.  Discussed the role of CRNA in patient's perioperative care. Patient's mother understands and gives consent. Discussed with mother about patient's wishes in the event of perioperative cardiac arrest. She believes her daughter would want to be resuscitated  in such an event.)        Anesthesia Quick Evaluation

## 2023-11-28 NOTE — Progress Notes (Signed)
 Assisted with transporting PT to [facility] OR while ventilating / oxygenating via AMBU bag- uneventful.

## 2023-11-28 NOTE — Anesthesia Postprocedure Evaluation (Signed)
 Anesthesia Post Note  Patient: Renee [name]  Procedure(s) Performed: LAPAROTOMY, EXPLORATORY EXCISION, SMALL INTESTINE WITH ANASTOMOSIS SECONDARY CLOSURE OF WOUND AND J TUBE CREATION     Patient location during evaluation: ICU Anesthesia Type: General Level of consciousness: sedated and patient remains intubated per anesthesia plan Pain management: pain level controlled Vital Signs Assessment: post-procedure vital signs reviewed and stable Respiratory status: patient remains intubated per anesthesia plan Cardiovascular status: stable Postop Assessment: no apparent nausea or vomiting Anesthetic complications: no   No notable events documented.  Last Vitals:  Vitals:   11/28/23 1545 11/28/23 1600  BP:    Pulse: (!) 54 (!) 52  Resp: 18 18  Temp:    SpO2: 95% 97%    Last Pain:  Vitals:   11/28/23 1516  TempSrc: Oral  PainSc:                  Rome Ade
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
## 2023-11-28 NOTE — Op Note (Addendum)
 Preoperative diagnosis: open abdomen, malnutrition  Postoperative diagnosis: same   Procedure: small bowel resection, jejunostomy tube insertion, closure of abdomen  Surgeon: Herlene Bureau, M.D.  Asst: Camellia Blush, M.D.  Anesthesia: GETA  Indications for procedure: Renee [name] is a 48 y.o. year old female with symptoms of concern for shock was taken to the OR 2 days ago with concern for patchy ischemia left open. She has had persistent shock/metabolic derangement and presents for reexploration after resuscitation.  Description of procedure: The patient was brought into the operative suite. Anesthesia was administered with General endotracheal anesthesia. WHO checklist was applied. The patient was then placed in supine position. The area was prepped and draped in the usual sterile fashion.  Next, the abtherra was removed. The abdomen was inspected.  The small intestine showed no signs of adhesive or acute changes.  The alum enteric limb was identified and there was a 8 cm patch concerning for ischemia.  Remainder of the small intestine looked normal.  We used the spy ICG technology to inspect the viability of the patchy ischemia.  There was no filling of ICG within the serosa at this area therefore decision was made for resection.  So the intestine was divided with 75 mm blue load GIA staplers about 15 cm distal to the DI and then additionally 10 cm distal to that for a 10 cm resection.  Side-to-side functional end-to-end anastomosis was made with a 75 mm blue load GIA stapler and the enterotomy was closed with a running PDS.  3-0 silks were then used to imbricate the closure.  3-0 silks were used to close the mesenteric defect.  3-0 silk was used for antitension stitch at the stapler end.  Due to severe malabsorptive procedure and concern for severe malabsorption, decision was made to place J-tube.  The second anastomosis, likely from the Roux biliary at bypass reversal, was used as J-tube  location.  3-0 silk was used for pursestring.  Incision was made in the left abdominal wall and the J-tube was inserted and enterotomy was made J-tube was inserted care was taken to make sure it was moving distally.  8 mL of water was injected into the balloon.  The pursestring was tied down.  The jejunum was sewn to the abdominal wall using 2-0 silks in 4 directions.  Next, the fascia was closed in 0 PDS running fashion.  Saline into Kerlix was packed into the subcutaneous tissues.  All counts were correct.  The patient was taken back to the ICU in critical condition.  Findings: Patchy ischemia of the alum enteric limb  Specimen: Ileum  Implant: 16 French J-tube in the left abdomen  Blood loss: 150 mL  Local anesthesia: None  Complications: None  Herlene Bureau, M.D. General, Bariatric, & Minimally Invasive Surgery [facility]

## 2023-11-28 NOTE — Progress Notes (Addendum)
 Arterial line at this time- draws and flushed blood, good waveform, insertion site WNL. Line is labeled.

## 2023-11-28 NOTE — Progress Notes (Signed)
 Pharmacy: Electrolytes  Labs drawn at 16:26 Phos 2.5 - OK Mg 1.4  Plan: Mag 4 gm IVPB per Elink protocol   Rosaline IVAR Edison, Pharm.D Use secure chat for questions 11/28/2023 5:19 PM

## 2023-11-28 NOTE — Progress Notes (Addendum)
 eLink Physician-Brief Progress Note Patient Name: Renee [name] DOB: May 01, 1975 MRN: [id number]   Date of Service  11/28/2023  HPI/Events of Note  48 year old presenting with acute respiratory failure and shock complicated by mesenteric ischemia.  Primary team passed along and report to limit phenylephrine to 100 mcg and titrate norepinephrine as needed to maintain MAP greater than 65.  Norepinephrine requirements are nearing maximal amounts, phenylephrine remains at roughly 100 mics  eICU Interventions  Primary team placed orders for vasopressin, agree with this  Will add a one-time albumin 25% bolus as well, +6 L overall in the past 24 hours   0624 -K-Phos, magnesium , calcium supplementation  Intervention Category Major Interventions: Hypotension - evaluation and management  Joslynn Jamroz 11/28/2023, 5:55 AM

## 2023-11-29 ENCOUNTER — Encounter (HOSPITAL_COMMUNITY): Payer: Self-pay | Admitting: General Surgery

## 2023-11-29 DIAGNOSIS — K559 Vascular disorder of intestine, unspecified: Secondary | ICD-10-CM | POA: Diagnosis not present

## 2023-11-29 DIAGNOSIS — R579 Shock, unspecified: Secondary | ICD-10-CM | POA: Diagnosis not present

## 2023-11-29 DIAGNOSIS — D649 Anemia, unspecified: Secondary | ICD-10-CM | POA: Diagnosis not present

## 2023-11-29 DIAGNOSIS — F3130 Bipolar disorder, current episode depressed, mild or moderate severity, unspecified: Secondary | ICD-10-CM

## 2023-11-29 LAB — BASIC METABOLIC PANEL WITH GFR
Anion gap: 8 (ref 5–15)
Anion gap: 8 (ref 5–15)
Anion gap: 8 (ref 5–15)
BUN: 10 mg/dL (ref 6–20)
BUN: 10 mg/dL (ref 6–20)
BUN: 10 mg/dL (ref 6–20)
CO2: 14 mmol/L — ABNORMAL LOW (ref 22–32)
CO2: 16 mmol/L — ABNORMAL LOW (ref 22–32)
CO2: 17 mmol/L — ABNORMAL LOW (ref 22–32)
Calcium: 7.2 mg/dL — ABNORMAL LOW (ref 8.9–10.3)
Calcium: 7.4 mg/dL — ABNORMAL LOW (ref 8.9–10.3)
Calcium: 7.3 mg/dL — ABNORMAL LOW (ref 8.9–10.3)
Chloride: 117 mmol/L — ABNORMAL HIGH (ref 98–111)
Chloride: 116 mmol/L — ABNORMAL HIGH (ref 98–111)
Chloride: 116 mmol/L — ABNORMAL HIGH (ref 98–111)
Creatinine, Ser: 0.56 mg/dL (ref 0.44–1.00)
Creatinine, Ser: 0.61 mg/dL (ref 0.44–1.00)
Creatinine, Ser: 0.63 mg/dL (ref 0.44–1.00)
GFR, Estimated: >60 mL/min (ref >60)
GFR, Estimated: >60 mL/min (ref >60)
GFR, Estimated: >60 mL/min (ref >60)
Glucose, Bld: 100 mg/dL — ABNORMAL HIGH (ref 70–99)
Glucose, Bld: 106 mg/dL — ABNORMAL HIGH (ref 70–99)
Glucose, Bld: 76 mg/dL (ref 70–99)
Potassium: 4.3 mmol/L (ref 3.5–5.1)
Potassium: 4.4 mmol/L (ref 3.5–5.1)
Potassium: 4.1 mmol/L (ref 3.5–5.1)
Sodium: 139 mmol/L (ref 135–145)
Sodium: 140 mmol/L (ref 135–145)
Sodium: 141 mmol/L (ref 135–145)

## 2023-11-29 LAB — CBC
HCT: 26.9 % — ABNORMAL LOW (ref 36.0–46.0)
HCT: 25.4 % — ABNORMAL LOW (ref 36.0–46.0)
Hemoglobin: 8.9 g/dL — ABNORMAL LOW (ref 12.0–15.0)
Hemoglobin: 8.5 g/dL — ABNORMAL LOW (ref 12.0–15.0)
MCH: 29.6 pg (ref 26.0–34.0)
MCH: 30.4 pg (ref 26.0–34.0)
MCHC: 33.1 g/dL (ref 30.0–36.0)
MCHC: 33.5 g/dL (ref 30.0–36.0)
MCV: 89.4 fL (ref 80.0–100.0)
MCV: 90.7 fL (ref 80.0–100.0)
Platelets: 57 K/uL — ABNORMAL LOW (ref 150–400)
Platelets: 44 K/uL — ABNORMAL LOW (ref 150–400)
RBC: 3.01 MIL/uL — ABNORMAL LOW (ref 3.87–5.11)
RBC: 2.8 MIL/uL — ABNORMAL LOW (ref 3.87–5.11)
RDW: 19.5 % — ABNORMAL HIGH (ref 11.5–15.5)
RDW: 19.3 % — ABNORMAL HIGH (ref 11.5–15.5)
WBC: 11 K/uL — ABNORMAL HIGH (ref 4.0–10.5)
WBC: 9.1 K/uL (ref 4.0–10.5)
nRBC: 0.2 % (ref 0.0–0.2)
nRBC: 0.2 % (ref 0.0–0.2)

## 2023-11-29 LAB — GLUCOSE, CAPILLARY
Glucose-Capillary: 103 mg/dL — ABNORMAL HIGH (ref 70–99)
Glucose-Capillary: 55 mg/dL — ABNORMAL LOW (ref 70–99)
Glucose-Capillary: 67 mg/dL — ABNORMAL LOW (ref 70–99)
Glucose-Capillary: 69 mg/dL — ABNORMAL LOW (ref 70–99)
Glucose-Capillary: 71 mg/dL (ref 70–99)
Glucose-Capillary: 77 mg/dL (ref 70–99)
Glucose-Capillary: 86 mg/dL (ref 70–99)
Glucose-Capillary: 94 mg/dL (ref 70–99)
Glucose-Capillary: 97 mg/dL (ref 70–99)

## 2023-11-29 LAB — PROTIME-INR
INR: 1.5 — ABNORMAL HIGH (ref 0.8–1.2)
Prothrombin Time: 18.7 s — ABNORMAL HIGH (ref 11.4–15.2)

## 2023-11-29 LAB — COOXEMETRY PANEL
Carboxyhemoglobin: 1.3 % (ref 0.5–1.5)
Methemoglobin: 2.6 % — ABNORMAL HIGH (ref 0.0–1.5)
O2 Saturation: 69.6 %
Total hemoglobin: 9 g/dL — ABNORMAL LOW (ref 12.0–16.0)

## 2023-11-29 LAB — MISC LABCORP TEST (SEND OUT): Labcorp test code: 716910

## 2023-11-29 LAB — PHOSPHORUS
Phosphorus: 2.6 mg/dL (ref 2.5–4.6)
Phosphorus: 2.2 mg/dL — ABNORMAL LOW (ref 2.5–4.6)

## 2023-11-29 LAB — MAGNESIUM
Magnesium: 1.8 mg/dL (ref 1.7–2.4)
Magnesium: 2 mg/dL (ref 1.7–2.4)

## 2023-11-29 LAB — SURGICAL PATHOLOGY

## 2023-11-29 LAB — COPPER, SERUM: Copper: 29 ug/dL — ABNORMAL LOW (ref 80–158)

## 2023-11-29 MED ORDER — NALOXONE HCL 0.4 MG/ML IJ SOLN
0.2000 mg | INTRAMUSCULAR | Status: DC | PRN
  Administered 2023-11-29: 0.4 mg via INTRAVENOUS

## 2023-11-29 MED ORDER — NALOXONE HCL 0.4 MG/ML IJ SOLN
INTRAMUSCULAR | Status: AC
  Filled 2023-11-29: qty 1

## 2023-11-29 MED ORDER — VITAL 1.5 CAL PO LIQD
1000.0000 mL | ORAL | Status: DC
  Administered 2023-11-29: 1000 mL via JEJUNOSTOMY
  Filled 2023-11-29 (×2): qty 1000

## 2023-11-29 MED ORDER — FENTANYL CITRATE (PF) 50 MCG/ML IJ SOSY
25.0000 ug | PREFILLED_SYRINGE | Freq: Four times a day (QID) | INTRAMUSCULAR | Status: DC | PRN
  Administered 2023-11-29 – 2023-12-03 (×10): 50 ug via INTRAVENOUS
  Filled 2023-11-29 (×10): qty 1

## 2023-11-29 MED ORDER — ORAL CARE MOUTH RINSE: 15.0000 mL | OROMUCOSAL | Status: DC | PRN

## 2023-11-29 MED ORDER — DEXTROSE 10 % IV SOLN: INTRAVENOUS | Status: DC

## 2023-11-29 MED ORDER — MAGNESIUM SULFATE 2 GM/50ML IV SOLN
2.0000 g | Freq: Once | INTRAVENOUS | Status: AC
  Administered 2023-11-29: 2 g via INTRAVENOUS
  Filled 2023-11-29: qty 50

## 2023-11-29 MED ORDER — ORAL CARE MOUTH RINSE
15.0000 mL | OROMUCOSAL | Status: DC
  Administered 2023-11-29 – 2023-12-06 (×21): 15 mL via OROMUCOSAL

## 2023-11-29 NOTE — Progress Notes (Signed)
 Nutrition Follow-up  DOCUMENTATION CODES:   Non-severe (moderate) malnutrition in context of chronic illness  INTERVENTION:  - Initiate trickle tube feeding via J-tube: Vital 1.5 @ 26mL/hr  - Monitor magnesium , potassium, and phosphorus daily for at least 3 days, MD to replete as needed, as pt is at risk for refeeding syndrome given malnutrition. - Continue 100mg  thiamine .  - Once able to advance past trickles, recommend below TF regimen: Vital 1.5 at 50 ml/h (1200 ml per day) *Would recommend advancing by 10mL Q24H to per Surgery/CCM Prosource TF20 60 ml daily Provides 1880 kcal, 101 gm protein, 917 ml free water daily  - FWF per CCM.  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 *ongoing  GOAL:   Patient will meet greater than or equal to 90% of their needs *progressing, starting TF  MONITOR:   Vent status, Labs, Weight trends  REASON FOR ASSESSMENT:   Consult Assessment of nutrition requirement/status (severe malnutrition; history of gastric bypass convered to duodenal switch and has not been taking vitamins)  ASSESSMENT:   48 y.o. 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 Admitted  for low electrolytes and low calcium.  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  Patient is currently intubated on ventilator support MV: 8.8 L/min Temp (24hrs), Avg:98.6 F (37 C), Min:97.2 F (36.2 C), Max:99.1 F (37.3 C)  No family at bedside. RN reports weaning down pressors steadily this AM.   Surgery okay with starting TF via J-tube but deferring to CCM for appropriateness.  Dicussed with CCM, can initiate trickles (5mL/hr) via J-tube this afternoon. No plan for advancement yet. Discussed plan with RN.   Vitamin/minerals labs remain pending at this time.     Medications reviewed and include: Calcium cirtrate TID, 1mg  folic acid , 100mg  thiamine , 50,000 units vitamin D  weekly, Protonix  Fentanyl  Levophed @ 30mcg/min Vasopressin @ 0.03 units/min    Labs reviewed:  -   Vitamins/Minerals: Vitamin D : < 4.2 (LOW) Vitamin B12: 570 (WNL) Iron: WNL Vitamin A : pending Vitamin E: pending Vitamin K: pending Thiamine : pending Zinc : pending Copper : pending  Diet Order:   Diet Order             Diet NPO time specified  Diet effective now                   EDUCATION NEEDS:  Not appropriate for education at this time  Skin:  Skin Assessment: Skin Integrity Issues: Skin Integrity Issues:: Incisions Incisions: Abdomen  Last BM:  11/2 - type 7  Height:  Ht Readings from Last 1 Encounters:  11/28/23 5' 7 (1.702 m)   Weight:  Wt Readings from Last 1 Encounters:  11/25/23 64.4 kg   Ideal Body Weight:  61.36 kg  BMI:  Body mass index is 22.24 kg/m.  Estimated Nutritional Needs:  Kcal:  1800-2100 kcals Protein:  90-115 grams Fluid:  >/= 1.8L    [name] RD, LDN Contact via Secure Chat.

## 2023-11-29 NOTE — Progress Notes (Signed)
 Called to bedside to evaluate patient with concern for near self-extubation / self extubation  ETT 14 lip PRVC On 25 mcg fent  Fent gtt turned off, briefly put in PSV and pulls Vt >800cc. She is drowsy but follows commands      Extubate. If fails will re-intubate   Given narcan EtCO2  O2 as needed for goal > 92    Ronnald Gave MSN, AGACNP-BC [facility] Pulmonary/Critical Care Medicine 11/29/2023, 1:03 PM

## 2023-11-29 NOTE — Progress Notes (Signed)
 eLink Physician-Brief Progress Note Patient Name: Renee [name] DOB: [birth year] MRN: [id number]   Date of Service  11/29/2023  HPI/Events of Note  Recurrent hypoglycemia episodes, on low-dose tube feeds  eICU Interventions  Initiate D10 infusion   0204 - Aline is not functional - dampened and inconsistent with cuff. D/C aline  0356 - increase D10 infusion  0620 - yet another hypoglycemic episode, increase D10  Intervention Category Intermediate Interventions: Hyperglycemia - evaluation and treatment  Jermiah Howton 11/29/2023, 8:39 PM

## 2023-11-29 NOTE — Progress Notes (Signed)
 Arterial line at this time has good waveform, draws and flushes blood, insertion site WNL.

## 2023-11-29 NOTE — Progress Notes (Addendum)
  1 Day Post-Op   Chief Complaint/Subjective: Weaned off neo, walking up  Objective: Vital signs in last 24 hours: Temp:  [97.2 F (36.2 C)-97.4 F (36.3 C)] 97.2 F (36.2 C) (11/05 0000) Pulse Rate:  [39-64] 49 (11/05 0000) Resp:  [0-18] 18 (11/05 0000) SpO2:  [94 %-100 %] 100 % (11/05 0000) Arterial Line BP: (62-135)/(37-91) 99/60 (11/05 0700) FiO2 (%):  [30 %-60 %] 50 % (11/05 0455) Last BM Date : 11/26/23 Intake/Output from previous day: 11/04 0701 - 11/05 0700 In: 7311.1 [I.V.:6316.8; IV Piggyback:964.4] Out: 1825 [Urine:1525; Emesis/NG output:100; Drains:150; Blood:50]  PE: Gen: intubated sedated, moving all extremities to stimuli Resp: assisted Card: bradycardic Abd: open wound with clean base  Lab Results:  Recent Labs    11/28/23 0507 11/29/23 0506  WBC 6.8 11.0*  HGB 10.0* 8.9*  HCT 30.4* 26.9*  PLT 106* 57*   Recent Labs    11/27/23 0338 11/28/23 0507  NA 149* 143  K 2.9* 3.4*  CL 114* 119*  CO2 24 18*  GLUCOSE 106* 127*  BUN 8 9  CREATININE 0.45 0.40*  CALCIUM 5.5* 5.0*   Recent Labs    11/26/23 1733  LABPROT 18.3*  INR 1.4*      Component Value Date/Time   NA 143 11/28/2023 0507   NA 137 04/22/2019 1634   K 3.4 (L) 11/28/2023 0507   CL 119 (H) 11/28/2023 0507   CO2 18 (L) 11/28/2023 0507   GLUCOSE 127 (H) 11/28/2023 0507   BUN 9 11/28/2023 0507   BUN 8 04/22/2019 1634   CREATININE 0.40 (L) 11/28/2023 0507   CREATININE 0.73 08/02/2016 1237   CALCIUM 5.0 (LL) 11/28/2023 0507   PROT 3.9 (L) 11/27/2023 0338   PROT 5.4 (L) 04/22/2019 1634   ALBUMIN 2.7 (L) 11/27/2023 0338   ALBUMIN 3.0 (L) 04/22/2019 1634   AST 56 (H) 11/27/2023 0338   ALT 32 11/27/2023 0338   ALKPHOS 136 (H) 11/27/2023 0338   BILITOT 0.5 11/27/2023 0338   BILITOT 0.3 04/22/2019 1634   GFRNONAA >60 11/28/2023 0507   GFRAA 80 04/22/2019 1634    Assessment/Plan  s/p Procedure(s): LAPAROTOMY, EXPLORATORY EXCISION, SMALL INTESTINE WITH ANASTOMOSIS SECONDARY  CLOSURE OF WOUND AND J TUBE CREATION 11/28/2023   Severe electrolyte derangement- hypophosphatemia/hypomagnesemia/hypocalcemia -improved -BMP pending -TPN -ICU electrolyte protocol  Switch anatomy, internal hernia -mesenteric defect closed, ischemia region of alimentary limb resected 11/4 with j tube creation within proximal BP limb.  Hypothyroid -IV replacement  FEN - ok for trophic j tube feeds if coming down on pressors and deemed appropriate by PCCM VTE - enoxaparin ID - ceftriaxone /flagyl  11/2--> Disposition - ICU   LOS: 4 days   I reviewed last 24 h vitals and pain scores, last 48 h intake and output, last 24 h labs and trends, and last 24 h imaging results.  This care required high  level of medical decision making.   Herlene Righter [facility] 11/29/2023, 7:58 AM Please see Amion for pager number during day hours 7:00am-4:30pm or 7:00am -11:30am on weekends

## 2023-11-29 NOTE — Progress Notes (Signed)
 Entered room due to vent alarming, Assessed ETT placement, noted to be at 14 cm. CCM notified, RT to bedside.  Prior to extubation patient had been -2 RASS, weaning sedation. Was on 25 mcg of fentanyl  at time of extubation- turned off. 0.4 mg Narcan administered. EtCO2 33-40.   1400- patient responsive, alert to voice.

## 2023-11-29 NOTE — Progress Notes (Signed)
 NAME:  Renee [name], MRN:  [id number], DOB:  Aug 11, 1975, LOS: 4 ADMISSION DATE:  11/25/2023, CONSULTATION DATE: 11/26/2023 REFERRING MD: Dr. Belinda, CHIEF COMPLAINT: Acute postoperative respiratory sufficiency  History of Present Illness:  48 yo female presented 11/1 with complaints of hand cramping and lack of medications. Pt stated at presentation that she had previously experienced these symptoms with low potassium. This has reportedly been a chronic issue since her bariatric surgery. She endorsed diarrhea for 7 days without blood present. Denied vomiting or nausea, fever/chills, no cp, sob or abdominal pain at presentation.    Of note all history is obtained from chart review as pt is intubated and sedated post operatively.    There appears to be some very complex social issues per chart review affecting pt's ability to procure her medications.    Unfortunately, the following day from admission pt began complaining of severe abdominal pain, ct abdomen ordered which was concerning for ischemic bowel vs SBO. Gen surg was consulted and pt was subsequently taken for emergent lap. Post operatively pt was brought to ICU with open abdomen and request from surgery to maintain deep sedation for pt with vasopressor support. They have plans for return to OR tomorrow for evaluation of questionable dusky bowel section.    Ccm consulted for vent support and medical management in conjunction with surgery.   Pertinent  Medical History   Past Medical History:  Diagnosis Date   ADD (attention deficit disorder with hyperactivity)    Allergy    Anemia    Anxiety and depression    followed by Dr. Vincente and Dwayne at [facility]   Bipolar disorder [facility])    sees Dr. Emilio Vincente   Chronic nausea    Chronic pain    DDD (degenerative disc disease), cervical 02/24/2015   C4 foraminal narrowing-chronic pain    Dermatitis    Edema    Fibromyalgia    GERD (gastroesophageal reflux disease)    Hearing  difficulty    Herpes simplex    Hypertension    Hypothyroidism    Migraines    OSA (obstructive sleep apnea) 01/26/2018   HST 02/08/18 AHI 1.0 is not diagnostic of obstructive sleep apnea   Polyarthralgia    Sepsis secondary to UTI (HCC) 03/09/2022   Skin abnormalities    sees [facility]   Urinary incontinence     Significant Hospital Events: Including procedures, antibiotic start and stop dates in addition to other pertinent events   11/1-admitted to the hospital.   11/2 Operating Room for mesenteric ischemic changes with pneumatosis and large portal venous gas 11/2 PCCM consulted for vent management 11/4 worsening status overnight now on multiple pressors. Small bowel resection J tube placement, closed 11/5 wean sedation, pressors   Interim History / Subjective:  Off neo  Plt drop from 106 to 57 Hgb from 10 to 9  WBC 11  BMP pending. Phos WNL mag 1.8   Objective    Blood pressure 105/74, pulse (!) 49, temperature 99.1 F (37.3 C), resp. rate 18, height 5' 7 (1.702 m), weight 64.4 kg, last menstrual period 03/02/2021, SpO2 100%.    Vent Mode: PRVC FiO2 (%):  [30 %-60 %] 40 % Set Rate:  [18 bmp] 18 bmp Vt Set:  [490 mL] 490 mL PEEP:  [5 cmH20] 5 cmH20 Plateau Pressure:  [16 cmH20-18 cmH20] 16 cmH20   Intake/Output Summary (Last 24 hours) at 11/29/2023 1158 Last data filed at 11/29/2023 0600 Gross per 24 hour  Intake  5030.06 ml  Output 1225 ml  Net 3805.06 ml   Filed Weights   11/25/23 1258 11/25/23 2100  Weight: 70.3 kg 64.4 kg    Examination:    General: chronically and critically ill middle aged F intubated sedated  HENT: ETT secure. Chronic L ocular defect  Lungs: CTAb, mechanically ventilated  Cardiovascular: bradycardic rate. S1s2  Abdomen: soft abdomen, surgical site is dressed. J tube  Extremities: no acute joint deformity  Neuro:  Sedated. Does not follow commands  GU: foley    Resolved problem list   Assessment and Plan    Postop  ventilator management  P -RASS goal 0/-1 -wean sedation for goal -WUA/SBT  -VAP, pulm hygiene   Pseudo petersen's hernia Mesenteric ischemia  -ex lap hernia repair 11/2, left open -sp small bowel resection and J tube placement 11/4, abd closure P -post op per CCS, d/w Dr stevie 11/5 -lighten rass goal 0/-1  -optimize lytes -- BMP pending, giving 2g mag  -J tube ok to use when medically appropriate -- hoping to wean pressors further and possibly start trickles 11/5   Shock - no perf, not sure this is septic (does have some possible lung infiltrate so possible PNA but was favored to be atelectasis). Could be distributive otherwise in setting of acidosis + sedation related P -MAP goal 65, wean as able -dc neo  -will send coox, if low will get echo EKG trops  -cont abx   Anemia Thrombocytopenia  P -send INR -afternoon CBC   Hypothyroidism -cont IV synthroid    HTN HLD -hold home meds, on pressors  / not yet using enteral access   Bipolar disorder PTSD Gout Fibromyalgia ADD P -home meds are held at present  Labs   CBC: Recent Labs  Lab 11/25/23 1320 11/26/23 0305 11/26/23 1715 11/26/23 1837 11/26/23 1859 11/26/23 2219 11/27/23 0338 11/28/23 0507 11/29/23 0506  WBC 13.4*   < > 16.1*  --   --  6.8 5.3 6.8 11.0*  NEUTROABS 7.0  --   --   --   --   --   --   --   --   HGB 11.1*   < > 12.5   < > 7.5* 9.1* 8.7* 10.0* 8.9*  HCT 32.7*   < > 38.3   < > 22.0* 27.1* 24.8* 30.4* 26.9*  MCV 87.9   < > 91.4  --   --  87.4 87.3 88.4 89.4  PLT 204   < > 178  --   --  136* 136* 106* 57*   < > = values in this interval not displayed.    Basic Metabolic Panel: Recent Labs  Lab 11/25/23 2307 11/26/23 0305 11/26/23 0652 11/26/23 1715 11/26/23 2037 11/27/23 0338 11/28/23 0507 11/28/23 1626 11/29/23 0506 11/29/23 0508 11/29/23 1110  NA 139   < > 140   < > 142 149* 143  --   --  139 140  K 3.3*   < > 3.2*   < > 3.2* 2.9* 3.4*  --   --  4.3 4.4  CL 109   < >  110   < > 114* 114* 119*  --   --  117* 116*  CO2 21*   < > 21*   < > 19* 24 18*  --   --  14* 16*  GLUCOSE 92   < > 87   < > 114* 106* 127*  --   --  100* 106*  BUN 7   < >  8   < > 7 8 9   --   --  10 10  CREATININE 0.70   < > 0.68   < > 0.47 0.45 0.40*  --   --  0.56 0.61  CALCIUM 6.5*   < > 7.3*   < > 6.0* 5.5* 5.0*  --   --  7.2* 7.4*  MG 1.9  --  1.6*  --   --   --  1.2* 1.4* 1.8  --   --   PHOS 1.3*  --  1.3*  --   --   --  1.0* 2.5 2.6  --   --    < > = values in this interval not displayed.   GFR: Estimated Creatinine Clearance: 84.5 mL/min (by C-G formula based on SCr of 0.61 mg/dL). Recent Labs  Lab 11/26/23 1715 11/26/23 2219 11/27/23 0338 11/28/23 0507 11/29/23 0506  WBC 16.1* 6.8 5.3 6.8 11.0*  LATICACIDVEN 1.5  --   --   --   --     Liver Function Tests: Recent Labs  Lab 11/25/23 2307 11/26/23 0305 11/26/23 0652 11/26/23 1715 11/27/23 0338  AST 296* 210* 149* 120* 56*  ALT 77* 72* 64* 64* 32  ALKPHOS 240* 252* 233* 250* 136*  BILITOT 1.1 1.4* 1.2 1.6* 0.5  PROT 5.6* 6.0* 5.7* 5.9* 3.9*  ALBUMIN 3.4* 3.5 3.4* 3.5 2.7*   No results for input(s): LIPASE, AMYLASE in the last 168 hours. Recent Labs  Lab 11/26/23 0305 11/27/23 0338  AMMONIA 108* 53*    ABG    Component Value Date/Time   PHART 7.254 (L) 11/26/2023 1859   PCO2ART 43.6 11/26/2023 1859   PO2ART 83 11/26/2023 1859   HCO3 19.3 (L) 11/26/2023 1859   TCO2 21 (L) 11/26/2023 1859   ACIDBASEDEF 7.0 (H) 11/26/2023 1859   O2SAT 69.6 11/29/2023 1110     Coagulation Profile: Recent Labs  Lab 11/25/23 1320 11/26/23 1733 11/29/23 1110  INR 1.4* 1.4* 1.5*    Cardiac Enzymes: No results for input(s): CKTOTAL, CKMB, CKMBINDEX, TROPONINI in the last 168 hours.  HbA1C: Hemoglobin A1C  Date/Time Value Ref Range Status  10/24/2016 02:42 PM 5.4  Final   Hgb A1c MFr Bld  Date/Time Value Ref Range Status  05/02/2017 01:50 PM 5.3 4.8 - 5.6 % Final    Comment:              Prediabetes: 5.7 - 6.4          Diabetes: >6.4          Glycemic control for adults with diabetes: <7.0   08/02/2016 12:37 PM 5.0 <5.7 % Final    Comment:      For the purpose of screening for the presence of diabetes:   <5.7%       Consistent with the absence of diabetes 5.7-6.4 %   Consistent with increased risk for diabetes (prediabetes) >=6.5 %     Consistent with diabetes   This assay result is consistent with a decreased risk of diabetes.   Currently, no consensus exists regarding use of hemoglobin A1c for diagnosis of diabetes in children.   According to American Diabetes Association (ADA) guidelines, hemoglobin A1c <7.0% represents optimal control in non-pregnant diabetic patients. Different metrics may apply to specific patient populations. Standards of Medical Care in Diabetes (ADA).       CBG: Recent Labs  Lab 11/28/23 2336 11/29/23 0509 11/29/23 0734 11/29/23 0749 11/29/23 1111  GLUCAP 105* 103* 69* 94 97  CRITICAL CARE Performed by: Ronnald FORBES Gave   Total critical care time: 38 minutes  Critical care time was exclusive of separately billable procedures and treating other patients. Critical care was necessary to treat or prevent imminent or life-threatening deterioration.  Critical care was time spent personally by me on the following activities: development of treatment plan with patient and/or surrogate as well as nursing, discussions with consultants, evaluation of patient's response to treatment, examination of patient, obtaining history from patient or surrogate, ordering and performing treatments and interventions, ordering and review of laboratory studies, ordering and review of radiographic studies, pulse oximetry and re-evaluation of patient's condition.  Ronnald Gave MSN, AGACNP-BC [facility] Amion for pager  11/29/2023, 11:58 AM

## 2023-11-29 NOTE — Procedures (Signed)
 Extubation Procedure Note  Patient Details:   Name: Renee [name] DOB: 1975/11/14 MRN: [id number]   Airway Documentation:    Vent end date: 11/29/23 Vent end time: 1255   Evaluation  O2 sats: stable throughout Complications: No apparent complications Patient did tolerate procedure well. Bilateral Breath Sounds: Clear, Diminished   Yes  [name] 11/29/2023, 12:58 PM  Possible self extubation. PT placed on 3 LPM nasal cannula post extubation- RN aware.

## 2023-11-29 NOTE — Plan of Care (Signed)
  Problem: Clinical Measurements: Goal: Ability to maintain clinical measurements within normal limits will improve Outcome: Progressing Goal: Will remain free from infection Outcome: Progressing Goal: Respiratory complications will improve Outcome: Progressing   Problem: Nutrition: Goal: Adequate nutrition will be maintained Outcome: Progressing   Problem: Elimination: Goal: Will not experience complications related to bowel motility Outcome: Progressing

## 2023-11-29 NOTE — Plan of Care (Signed)
  Problem: Clinical Measurements: Goal: Cardiovascular complication will be avoided Outcome: Progressing   Problem: Pain Managment: Goal: General experience of comfort will improve and/or be controlled Outcome: Progressing   Problem: Skin Integrity: Goal: Risk for impaired skin integrity will decrease Outcome: Progressing

## 2023-11-29 NOTE — Progress Notes (Signed)
 Called to PT room by CCM, NP to pull ETT- appears to have self extubated (ETT was 14 cm lip).

## 2023-11-30 DIAGNOSIS — R579 Shock, unspecified: Secondary | ICD-10-CM | POA: Diagnosis not present

## 2023-11-30 DIAGNOSIS — D649 Anemia, unspecified: Secondary | ICD-10-CM | POA: Diagnosis not present

## 2023-11-30 DIAGNOSIS — K559 Vascular disorder of intestine, unspecified: Secondary | ICD-10-CM | POA: Diagnosis not present

## 2023-11-30 LAB — VITAMIN K1, SERUM: VITAMIN K1: 0.5 ng/mL (ref 0.10–2.20)

## 2023-11-30 LAB — GLUCOSE, CAPILLARY
Glucose-Capillary: 69 mg/dL — ABNORMAL LOW (ref 70–99)
Glucose-Capillary: 65 mg/dL — ABNORMAL LOW (ref 70–99)
Glucose-Capillary: 79 mg/dL (ref 70–99)
Glucose-Capillary: 79 mg/dL (ref 70–99)
Glucose-Capillary: 85 mg/dL (ref 70–99)
Glucose-Capillary: 64 mg/dL — ABNORMAL LOW (ref 70–99)
Glucose-Capillary: 87 mg/dL (ref 70–99)
Glucose-Capillary: 83 mg/dL (ref 70–99)

## 2023-11-30 LAB — BASIC METABOLIC PANEL WITH GFR
Anion gap: 6 (ref 5–15)
Anion gap: 5 (ref 5–15)
BUN: 10 mg/dL (ref 6–20)
BUN: 8 mg/dL (ref 6–20)
CO2: 18 mmol/L — ABNORMAL LOW (ref 22–32)
CO2: 17 mmol/L — ABNORMAL LOW (ref 22–32)
Calcium: 7.3 mg/dL — ABNORMAL LOW (ref 8.9–10.3)
Calcium: 7.1 mg/dL — ABNORMAL LOW (ref 8.9–10.3)
Chloride: 117 mmol/L — ABNORMAL HIGH (ref 98–111)
Chloride: 118 mmol/L — ABNORMAL HIGH (ref 98–111)
Creatinine, Ser: 0.59 mg/dL (ref 0.44–1.00)
Creatinine, Ser: 0.58 mg/dL (ref 0.44–1.00)
GFR, Estimated: >60 mL/min (ref >60)
GFR, Estimated: >60 mL/min (ref >60)
Glucose, Bld: 67 mg/dL — ABNORMAL LOW (ref 70–99)
Glucose, Bld: 94 mg/dL (ref 70–99)
Potassium: 3.9 mmol/L (ref 3.5–5.1)
Potassium: 4.4 mmol/L (ref 3.5–5.1)
Sodium: 142 mmol/L (ref 135–145)
Sodium: 139 mmol/L (ref 135–145)

## 2023-11-30 LAB — ZINC: Zinc: 20 ug/dL — ABNORMAL LOW (ref 44–115)

## 2023-11-30 LAB — MAGNESIUM
Magnesium: 1.8 mg/dL (ref 1.7–2.4)
Magnesium: 1.8 mg/dL (ref 1.7–2.4)

## 2023-11-30 LAB — CBC
HCT: 22.5 % — ABNORMAL LOW (ref 36.0–46.0)
Hemoglobin: 7.3 g/dL — ABNORMAL LOW (ref 12.0–15.0)
MCH: 29.7 pg (ref 26.0–34.0)
MCHC: 32.4 g/dL (ref 30.0–36.0)
MCV: 91.5 fL (ref 80.0–100.0)
Platelets: 30 K/uL — ABNORMAL LOW (ref 150–400)
RBC: 2.46 MIL/uL — ABNORMAL LOW (ref 3.87–5.11)
RDW: 19.4 % — ABNORMAL HIGH (ref 11.5–15.5)
WBC: 6.8 K/uL (ref 4.0–10.5)
nRBC: 0.4 % — ABNORMAL HIGH (ref 0.0–0.2)

## 2023-11-30 LAB — VITAMIN E
Vitamin E (Alpha Tocopherol): 3 mg/L — ABNORMAL LOW (ref 7.0–25.1)
Vitamin E(Gamma Tocopherol): 0.6 mg/L (ref 0.5–5.5)

## 2023-11-30 LAB — PHOSPHORUS
Phosphorus: 1.8 mg/dL — ABNORMAL LOW (ref 2.5–4.6)
Phosphorus: 2.3 mg/dL — ABNORMAL LOW (ref 2.5–4.6)

## 2023-11-30 MED ORDER — ACETAMINOPHEN 160 MG/5ML PO SOLN
650.0000 mg | Freq: Four times a day (QID) | ORAL | Status: DC | PRN
  Administered 2023-11-30 – 2023-12-03 (×3): 650 mg
  Filled 2023-11-30 (×3): qty 20.3

## 2023-11-30 MED ORDER — MAGNESIUM SULFATE 2 GM/50ML IV SOLN
2.0000 g | Freq: Once | INTRAVENOUS | Status: AC
  Administered 2023-11-30: 2 g via INTRAVENOUS
  Filled 2023-11-30: qty 50

## 2023-11-30 MED ORDER — POTASSIUM PHOSPHATES 15 MMOLE/5ML IV SOLN
30.0000 mmol | Freq: Once | INTRAVENOUS | Status: AC
  Administered 2023-11-30: 30 mmol via INTRAVENOUS
  Filled 2023-11-30: qty 10

## 2023-11-30 MED ORDER — VITAL 1.5 CAL PO LIQD
1000.0000 mL | ORAL | Status: DC
  Administered 2023-11-30 – 2023-12-06 (×8): 1000 mL via JEJUNOSTOMY
  Filled 2023-11-30 (×9): qty 1000

## 2023-11-30 MED ORDER — DEXTROSE 5 % IV SOLN: 2.0000 mg | Freq: Every day | INTRAVENOUS | Status: DC

## 2023-11-30 MED ORDER — PROSOURCE TF20 ENFIT COMPATIBL EN LIQD
60.0000 mL | Freq: Every day | ENTERAL | Status: DC
  Administered 2023-12-01 – 2023-12-06 (×6): 60 mL
  Filled 2023-11-30 (×7): qty 60

## 2023-11-30 NOTE — Progress Notes (Addendum)
 Nutrition Follow-up  DOCUMENTATION CODES:   Non-severe (moderate) malnutrition in context of chronic illness  INTERVENTION:  - Begin advancing to goal TF regimen slowly as below: Vital 1.5 at 50 ml/h (1200 ml per day) *Advance to 50mL/hr today and then begin advancing by 10mL Q24H Prosource TF20 60 ml daily Provides 1880 kcal, 101 gm protein, 917 ml free water daily  - Monitor magnesium , potassium, and phosphorus daily for at least 3 days, MD to replete as needed, as pt is at risk for refeeding syndrome given malnutrition. - Continue 100mg  thiamine .   - FWF per CCM.  - Clear liquid diet. - Consider Ensure Plus High Protein po BID  once patient tolerating clear liquids consistently and advance to full liquids. 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  - Vitamin B12: 570 (WNL) - Iron: WNL - CRP: 3.4 (HIGH) - Vitamin D : < 4.2 (LOW) -  recommend 50,000 units weekly x8 weeks (once able to take PO meds) - Vitamin E: 3.0 (LOW) - recommend 400 units daily x30 days then re-check (once able to take PO meds) - Zinc : 20 (LOW) - recommend 220mg  daily x14 days then re-check (once able to take PO meds) - Copper : 29 (LOW) - starting 2mg  IV copper  infusion (IVPB at 68mL/hr over 2 hours) x7 days, then re-check (SEE ADENDUM) - Selenium: 52 (LOW) - recommend 100mcg daily x30 days then re-check (once able to take PO meds) - Thiamine : pending - Vitamin A : pending - Vitamin K: pending - Needs daily Multivitamin with minerals daily once able to take PO meds as well.  ADDENDUM: Received secure chat from pharmacy that no IV copper  available. 2mg  tablets are in stock. Per pharmacist, will need to use PO meds due to shortages. Will continue to follow to see when patient able to take PO meds and supplementation can be  ordered. 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 *ongoing  GOAL:   Patient will meet greater than or equal to 90% of their needs *progressing, on TF and oral diet  MONITOR:   Vent status, Labs, Weight trends  REASON FOR ASSESSMENT:   Consult Assessment of nutrition requirement/status (severe malnutrition; history of gastric bypass convered to duodenal switch and has not been taking vitamins)  ASSESSMENT:   48 y.o. 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 Admitted for low electrolytes and low calcium.  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 11/5 Trickle TF (82mL/hr) initiated via J-tube; Patient self extubated overnight 11/6 CLD  Patient self extubated yesterday. Patient sleepy and lethargic at time of visit. Reports she just had a BM (informed RN). Falling asleep often during conversation so difficult to obtain full nutrition history.   Patient did  confirm she was not taking any vitamin/mineral supplementation PTA. Dicussed that several levels have come back and going to start supplementation, patient endorsed understanding.    Dicussed patient with CCM. Surgery and CCM okay with advancing tube feeds. Given risk of refeeding and recent bowel surgery with new  J-tube, will plan to advance very slowly by 10mL Q24H. Discussed plan with RN's.   Patient able to pass bedside swallow eval this AM and advance to clear liquids.  Vitamin E, Selenium, zinc , and copper  have all resulted and came back low. Discussed starting repletion however per CCM, patient not able to take PO meds yet. Can do liquid meds via tube but otherwise will have to be IV repletion only right now. Will go ahead and order IV copper  repletion but will have to wait on others until patient can take PO meds.  Of note, patient previously ordered 50000 units vitamin D  repletion but this was discontinued today, suspect due to patient not being able to take PO meds. She never got her initial dose, so this will need to also be restarted once she is able to take PO meds.    Admit weight: 155# -> 142# Current weight: 189# (suspect elevated d/t fluid) I&O's: +21.4L since admit + for mild pitting generalized edema and RLE/LLE edema, and moderate pitting RUE/LUE edema   Medications reviewed and include: Continuous calcium infusion, 1mg  folic acid , 100mg  thiamine , 50,000 units vitamin D  weekly, Protonix , D10 @ 68mL/hr (provides 245 kcals over 24 hours)   Labs reviewed:  Phosphorus 1.8   Vitamins/Minerals: Vitamin B12: 570 (WNL) Iron: WNL CRP: 3.4 (HIGH) Vitamin D : < 4.2 (LOW)  Vitamin E: 3.0 (LOW)  Zinc : 20 (LOW)  Copper : 29 (LOW)  Selenium: 52 (LOW)  Thiamine : pending Vitamin A : pending Vitamin K: pending  Diet Order:   Diet Order             Diet clear liquid Room service appropriate? Yes; Fluid consistency: Thin  Diet effective now                   EDUCATION NEEDS:  Not appropriate for education at this time  Skin:  Skin Assessment: Skin Integrity Issues: Skin Integrity Issues:: Incisions Incisions: Abdomen  Last BM:  11/6 - type 6  Height:  Ht Readings from Last 1 Encounters:  11/28/23 5' 7 (1.702 m)   Weight:  Wt Readings from Last 1 Encounters:  11/30/23  86 kg   Ideal Body Weight:  61.36 kg  BMI:  Body mass index is 29.69 kg/m.  Estimated Nutritional Needs:  Kcal:  1800-2100 kcals Protein:  90-115 grams Fluid:  >/= 1.8L    [name] RD, LDN Contact via Secure Chat.

## 2023-11-30 NOTE — Progress Notes (Deleted)
 CORTRAK TUBE INSERTION   A 10 F Cortrak tube has been placed. The tube was secured at the right nare at 85 cm by a member of the Cortrak tube team. The tube tip is in the stomach.  X-ray is required, abdominal x-ray has been ordered. Please confirm tube placement before using the Cortrak tube.    If the tube becomes dislodged please keep the tube and contact the Rapid Response team at [phone] for replacement.  If after hours and replacement cannot be delayed, place a NG tube and confirm placement with an abdominal x-ray.  Oneil LITTIE Norse, RN 11/30/2023 3:13 PM

## 2023-11-30 NOTE — Progress Notes (Signed)
  2 Days Post-Op   Chief Complaint/Subjective: Self extubated yesterday, no abdominal pain, +bowel movements overnight, off all pressors  Objective: Vital signs in last 24 hours: Temp:  [97.2 F (36.2 C)-99.3 F (37.4 C)] 97.2 F (36.2 C) (11/06 0400) Pulse Rate:  [47-90] 79 (11/06 0600) Resp:  [5-18] 5 (11/06 0600) BP: (81-129)/(53-85) 110/73 (11/06 0600) SpO2:  [93 %-100 %] 93 % (11/06 0600) Arterial Line BP: (76-130)/(61-105) 89/86 (11/06 0300) FiO2 (%):  [40 %] 40 % (11/05 0926) Weight:  [86 kg] 86 kg (11/06 0440) Last BM Date : 11/29/23 Intake/Output from previous day: 11/05 0701 - 11/06 0700 In: 6235.5 [I.V.:5758.7; NG/GT:136; IV Piggyback:340.8] Out: 1400 [Urine:1400]  PE: Gen: NAD, slow speech Resp: nonlabored Card: RRR Abd: open incision with clean base  Lab Results:  Recent Labs    11/29/23 0506 11/29/23 1710  WBC 11.0* 9.1  HGB 8.9* 8.5*  HCT 26.9* 25.4*  PLT 57* 44*   Recent Labs    11/29/23 1710 11/30/23 0335  NA 141 142  K 4.1 3.9  CL 116* 117*  CO2 17* 18*  GLUCOSE 76 67*  BUN 10 10  CREATININE 0.63 0.59  CALCIUM 7.3* 7.3*   Recent Labs    11/29/23 1110  LABPROT 18.7*  INR 1.5*      Component Value Date/Time   NA 142 11/30/2023 0335   NA 137 04/22/2019 1634   K 3.9 11/30/2023 0335   CL 117 (H) 11/30/2023 0335   CO2 18 (L) 11/30/2023 0335   GLUCOSE 67 (L) 11/30/2023 0335   BUN 10 11/30/2023 0335   BUN 8 04/22/2019 1634   CREATININE 0.59 11/30/2023 0335   CREATININE 0.73 08/02/2016 1237   CALCIUM 7.3 (L) 11/30/2023 0335   PROT 3.9 (L) 11/27/2023 0338   PROT 5.4 (L) 04/22/2019 1634   ALBUMIN 2.7 (L) 11/27/2023 0338   ALBUMIN 3.0 (L) 04/22/2019 1634   AST 56 (H) 11/27/2023 0338   ALT 32 11/27/2023 0338   ALKPHOS 136 (H) 11/27/2023 0338   BILITOT 0.5 11/27/2023 0338   BILITOT 0.3 04/22/2019 1634   GFRNONAA >60 11/30/2023 0335   GFRAA 80 04/22/2019 1634    Assessment/Plan  s/p Procedure(s): LAPAROTOMY,  EXPLORATORY EXCISION, SMALL INTESTINE WITH ANASTOMOSIS SECONDARY CLOSURE OF WOUND AND J TUBE CREATION 11/28/2023   FEN - advance j tube feeds, clear liquids if mentation allows VTE - lovenox ID - from GI standpoint can stop abx in next 24 h Disposition - ICU, improving   LOS: 5 days   I reviewed last 24 h vitals and pain scores, last 48 h intake and output, last 24 h labs and trends, and last 24 h imaging results.  This care required moderate level of medical decision making.   Herlene Righter [facility] at [facility] 11/30/2023, 8:10 AM Please see Amion for pager number during day hours 7:00am-4:30pm or 7:00am -11:30am on weekends

## 2023-11-30 NOTE — Progress Notes (Signed)
 Informed about drop in hematocrit  Ensure labs in a.m.

## 2023-11-30 NOTE — Progress Notes (Signed)
 NAME:  Renee [name], MRN:  [id number], DOB:  March 10, 1975, LOS: 5 ADMISSION DATE:  11/25/2023, CONSULTATION DATE: 11/26/2023 REFERRING MD: Dr. Belinda, CHIEF COMPLAINT: Acute postoperative respiratory sufficiency  History of Present Illness:  48 yo female presented 11/1 with complaints of hand cramping and lack of medications. Pt stated at presentation that she had previously experienced these symptoms with low potassium. This has reportedly been a chronic issue since her bariatric surgery. She endorsed diarrhea for 7 days without blood present. Denied vomiting or nausea, fever/chills, no cp, sob or abdominal pain at presentation.    Of note all history is obtained from chart review as pt is intubated and sedated post operatively.    There appears to be some very complex social issues per chart review affecting pt's ability to procure her medications.    Unfortunately, the following day from admission pt began complaining of severe abdominal pain, ct abdomen ordered which was concerning for ischemic bowel vs SBO. Gen surg was consulted and pt was subsequently taken for emergent lap. Post operatively pt was brought to ICU with open abdomen and request from surgery to maintain deep sedation for pt with vasopressor support. They have plans for return to OR tomorrow for evaluation of questionable dusky bowel section.    Ccm consulted for vent support and medical management in conjunction with surgery.   Pertinent  Medical History   Past Medical History:  Diagnosis Date   ADD (attention deficit disorder with hyperactivity)    Allergy    Anemia    Anxiety and depression    followed by Dr. Vincente and Dwayne at [facility]   Bipolar disorder [facility])    sees Dr. Emilio Vincente   Chronic nausea    Chronic pain    DDD (degenerative disc disease), cervical 02/24/2015   C4 foraminal narrowing-chronic pain    Dermatitis    Edema    Fibromyalgia    GERD (gastroesophageal reflux disease)    Hearing  difficulty    Herpes simplex    Hypertension    Hypothyroidism    Migraines    OSA (obstructive sleep apnea) 01/26/2018   HST 02/08/18 AHI 1.0 is not diagnostic of obstructive sleep apnea   Polyarthralgia    Sepsis secondary to UTI (HCC) 03/09/2022   Skin abnormalities    sees [facility]   Urinary incontinence     Significant Hospital Events: Including procedures, antibiotic start and stop dates in addition to other pertinent events   11/1-admitted to the hospital.   11/2 Operating Room for mesenteric ischemic changes with pneumatosis and large portal venous gas 11/2 PCCM consulted for vent management 11/4 worsening status overnight now on multiple pressors. Small bowel resection J tube placement, closed 11/5 wean sedation, pressors   Interim History / Subjective:  Off neo  Plt drop from 106 to 57 Hgb from 10 to 9  WBC 11  BMP pending. Phos WNL mag 1.8   Objective    Blood pressure 122/77, pulse 72, temperature (!) 97.3 F (36.3 C), temperature source Axillary, resp. rate 13, height 5' 7 (1.702 m), weight 86 kg, last menstrual period 03/02/2021, SpO2 98%.        Intake/Output Summary (Last 24 hours) at 11/30/2023 1036 Last data filed at 11/30/2023 1035 Gross per 24 hour  Intake 6245.49 ml  Output 1400 ml  Net 4845.49 ml   Filed Weights   11/25/23 1258 11/25/23 2100 11/30/23 0440  Weight: 70.3 kg 64.4 kg 86 kg    Examination:  General: chronically and critically ill middle aged F intubated sedated  HENT: ETT secure. Chronic L ocular defect  Lungs: CTAb, mechanically ventilated  Cardiovascular: bradycardic rate. S1s2  Abdomen: soft abdomen, surgical site is dressed. J tube  Extremities: no acute joint deformity  Neuro:  Sedated. Does not follow commands  GU: foley    Resolved problem list  Post op vent Shock  Assessment and Plan   Pseudo peterson's hernia Mesenteric ischemia  Hx bariatric surgery  -ex lap hernia repair 11/2, left open -sp  small bowel resection and J tube placement 11/4, abd closure P -post op per CCS -adv EN via J tube 11/6 -- No crushed meds per tube  -trial clear liquids PO 11/6 -stop abx tomorrow  -when taking PO meds, restart home POs  Inadequate PO intake Hypoglycemia  Hypophosphatemia  P -optimize lytes  -micronutrient support  -adv EN -trying PO clears -wean d5 as able   Anemia Thrombocytopenia  P -add on CBC and check in AM   Hypothyroidism -cont IV synthroid  until able to take POs   ?adrenal insufficiency  -need to clarify home meds. In review of 2024 [facility] admission looks like she was intended for steroids and endo eval but not seeing that this happened.   Bipolar disorder PTSD Gout Fibromyalgia ADD P -home meds are held at present  L/T/D -foley: dc -CVC: possible dc 11/6 -J tube: maintain    Likely stable for txf out of ICU to SDU vs progressive. If so will ask TRH to take over care 11/7  Labs   CBC: Recent Labs  Lab 11/25/23 1320 11/26/23 0305 11/26/23 2219 11/27/23 0338 11/28/23 0507 11/29/23 0506 11/29/23 1710  WBC 13.4*   < > 6.8 5.3 6.8 11.0* 9.1  NEUTROABS 7.0  --   --   --   --   --   --   HGB 11.1*   < > 9.1* 8.7* 10.0* 8.9* 8.5*  HCT 32.7*   < > 27.1* 24.8* 30.4* 26.9* 25.4*  MCV 87.9   < > 87.4 87.3 88.4 89.4 90.7  PLT 204   < > 136* 136* 106* 57* 44*   < > = values in this interval not displayed.    Basic Metabolic Panel: Recent Labs  Lab 11/28/23 0507 11/28/23 1626 11/29/23 0506 11/29/23 0508 11/29/23 1110 11/29/23 1710 11/30/23 0335  NA 143  --   --  139 140 141 142  K 3.4*  --   --  4.3 4.4 4.1 3.9  CL 119*  --   --  117* 116* 116* 117*  CO2 18*  --   --  14* 16* 17* 18*  GLUCOSE 127*  --   --  100* 106* 76 67*  BUN 9  --   --  10 10 10 10   CREATININE 0.40*  --   --  0.56 0.61 0.63 0.59  CALCIUM 5.0*  --   --  7.2* 7.4* 7.3* 7.3*  MG 1.2* 1.4* 1.8  --   --  2.0 1.8  PHOS 1.0* 2.5 2.6  --   --  2.2* 1.8*   GFR: Estimated  Creatinine Clearance: 98 mL/min (by C-G formula based on SCr of 0.59 mg/dL). Recent Labs  Lab 11/26/23 1715 11/26/23 2219 11/27/23 0338 11/28/23 0507 11/29/23 0506 11/29/23 1710  WBC 16.1*   < > 5.3 6.8 11.0* 9.1  LATICACIDVEN 1.5  --   --   --   --   --    < > =  values in this interval not displayed.    Liver Function Tests: Recent Labs  Lab 11/25/23 2307 11/26/23 0305 11/26/23 0652 11/26/23 1715 11/27/23 0338  AST 296* 210* 149* 120* 56*  ALT 77* 72* 64* 64* 32  ALKPHOS 240* 252* 233* 250* 136*  BILITOT 1.1 1.4* 1.2 1.6* 0.5  PROT 5.6* 6.0* 5.7* 5.9* 3.9*  ALBUMIN 3.4* 3.5 3.4* 3.5 2.7*   No results for input(s): LIPASE, AMYLASE in the last 168 hours. Recent Labs  Lab 11/26/23 0305 11/27/23 0338  AMMONIA 108* 53*    ABG    Component Value Date/Time   PHART 7.254 (L) 11/26/2023 1859   PCO2ART 43.6 11/26/2023 1859   PO2ART 83 11/26/2023 1859   HCO3 19.3 (L) 11/26/2023 1859   TCO2 21 (L) 11/26/2023 1859   ACIDBASEDEF 7.0 (H) 11/26/2023 1859   O2SAT 69.6 11/29/2023 1110     Coagulation Profile: Recent Labs  Lab 11/25/23 1320 11/26/23 1733 11/29/23 1110  INR 1.4* 1.4* 1.5*    Cardiac Enzymes: No results for input(s): CKTOTAL, CKMB, CKMBINDEX, TROPONINI in the last 168 hours.  HbA1C: Hemoglobin A1C  Date/Time Value Ref Range Status  10/24/2016 02:42 PM 5.4  Final   Hgb A1c MFr Bld  Date/Time Value Ref Range Status  05/02/2017 01:50 PM 5.3 4.8 - 5.6 % Final    Comment:             Prediabetes: 5.7 - 6.4          Diabetes: >6.4          Glycemic control for adults with diabetes: <7.0   08/02/2016 12:37 PM 5.0 <5.7 % Final    Comment:      For the purpose of screening for the presence of diabetes:   <5.7%       Consistent with the absence of diabetes 5.7-6.4 %   Consistent with increased risk for diabetes (prediabetes) >=6.5 %     Consistent with diabetes   This assay result is consistent with a decreased risk of diabetes.    Currently, no consensus exists regarding use of hemoglobin A1c for diagnosis of diabetes in children.   According to American Diabetes Association (ADA) guidelines, hemoglobin A1c <7.0% represents optimal control in non-pregnant diabetic patients. Different metrics may apply to specific patient populations. Standards of Medical Care in Diabetes (ADA).       CBG: Recent Labs  Lab 11/29/23 2109 11/29/23 2349 11/30/23 0333 11/30/23 0606 11/30/23 0750  GLUCAP 77 71 69* 65* 79    High MDM   Ronnald Gave MSN, AGACNP-BC [facility] Pulmonary/Critical Care Medicine Amion for pager  11/30/2023, 10:36 AM

## 2023-11-30 NOTE — Plan of Care (Signed)
  Problem: Education: Goal: Knowledge of General Education information will improve Description: Including pain rating scale, medication(s)/side effects and non-pharmacologic comfort measures Outcome: Progressing   Problem: Health Behavior/Discharge Planning: Goal: Ability to manage health-related needs will improve Outcome: Progressing   Problem: Nutrition: Goal: Adequate nutrition will be maintained Outcome: Progressing   Problem: Coping: Goal: Level of anxiety will decrease Outcome: Progressing   Problem: Elimination: Goal: Will not experience complications related to bowel motility Outcome: Progressing   Problem: Pain Managment: Goal: General experience of comfort will improve and/or be controlled Outcome: Progressing

## 2023-11-30 NOTE — Progress Notes (Signed)
 Pharmacy Electrolyte Replacement  Recent Labs: Recent Labs    11/30/23 0335  K 3.9  MG 1.8  PHOS 1.8*  CREATININE 0.59    Low Critical Values (K </= 2.5, Phos </= 1, Mg </= 1) Present: None   Plan:  K Phos 30 mmol IV x1 dose   Wanda Hasting PharmD, BCPS [facility] main pharmacy [phone] 11/30/2023 8:13 AM

## 2023-11-30 NOTE — Plan of Care (Signed)
  Problem: Education: Goal: Knowledge of General Education information will improve Description: Including pain rating scale, medication(s)/side effects and non-pharmacologic comfort measures Outcome: Progressing   Problem: Health Behavior/Discharge Planning: Goal: Ability to manage health-related needs will improve Outcome: Progressing   Problem: Clinical Measurements: Goal: Ability to maintain clinical measurements within normal limits will improve Outcome: Progressing Goal: Will remain free from infection Outcome: Progressing Goal: Diagnostic test results will improve Outcome: Progressing Goal: Respiratory complications will improve Outcome: Progressing Goal: Cardiovascular complication will be avoided Outcome: Progressing   Problem: Activity: Goal: Risk for activity intolerance will decrease Outcome: Progressing   Problem: Nutrition: Goal: Adequate nutrition will be maintained Outcome: Progressing   Problem: Coping: Goal: Level of anxiety will decrease Outcome: Progressing   Problem: Elimination: Goal: Will not experience complications related to bowel motility Outcome: Progressing Goal: Will not experience complications related to urinary retention Outcome: Progressing   Problem: Pain Managment: Goal: General experience of comfort will improve and/or be controlled Outcome: Progressing   Problem: Safety: Goal: Ability to remain free from injury will improve Outcome: Progressing   Problem: Skin Integrity: Goal: Risk for impaired skin integrity will decrease Outcome: Progressing   Cindy S. Loreli BSN, RN, CCRP, CCRN 11/30/2023 5:34 AM

## 2023-11-30 NOTE — Progress Notes (Signed)
   11/30/23 0900  Pre-Screen- If YES to any of the following, STOP the screen, keep NPO, and place order for SLP eval and treat.   Home diet required thickened liquids No - Proceed  Trach tube present No - Proceed  Radiation to Head/Neck No - Proceed  Patient Readiness- If YES to any of the following, WAIT to screen, keep NPO, and place order for SLP eval and treat. May rescreen if clinical improvement WITHIN 24h.    Is patient lethargic or unable to stay alert/awake? No - Proceed  HOB restricted to <30 degrees No - Proceed  NPO for planned procedure No - Proceed  Aspiration Risk Assessment  Is patient oriented to name, place, or year? Yes - Proceed  Able to open mouth, stick out tongue, or smile Yes - Proceed  Able to seal lips Yes - Proceed  Able to move tongue from side to side Yes - Proceed  Face is symmetric Yes - Proceed  Mandatory oral care performed Yes  3 oz Water Challenge  Does the patient stop drinking? No - Proceed  Does the patient cough/choke? No - Proceed  Document PASS or FAIL PASS- Obtain Diet Order

## 2023-11-30 NOTE — Progress Notes (Signed)
 Arterial line taken out and discontinued per MD.

## 2023-12-01 DIAGNOSIS — D696 Thrombocytopenia, unspecified: Secondary | ICD-10-CM | POA: Diagnosis not present

## 2023-12-01 DIAGNOSIS — R579 Shock, unspecified: Secondary | ICD-10-CM | POA: Diagnosis not present

## 2023-12-01 DIAGNOSIS — E440 Moderate protein-calorie malnutrition: Secondary | ICD-10-CM

## 2023-12-01 DIAGNOSIS — K559 Vascular disorder of intestine, unspecified: Secondary | ICD-10-CM

## 2023-12-01 LAB — BASIC METABOLIC PANEL WITH GFR
Anion gap: 5 (ref 5–15)
BUN: 7 mg/dL (ref 6–20)
CO2: 18 mmol/L — ABNORMAL LOW (ref 22–32)
Calcium: 7.2 mg/dL — ABNORMAL LOW (ref 8.9–10.3)
Chloride: 118 mmol/L — ABNORMAL HIGH (ref 98–111)
Creatinine, Ser: 0.5 mg/dL (ref 0.44–1.00)
GFR, Estimated: >60 mL/min (ref >60)
Glucose, Bld: 93 mg/dL (ref 70–99)
Potassium: 3.9 mmol/L (ref 3.5–5.1)
Sodium: 140 mmol/L (ref 135–145)

## 2023-12-01 LAB — GLUCOSE, CAPILLARY
Glucose-Capillary: 102 mg/dL — ABNORMAL HIGH (ref 70–99)
Glucose-Capillary: 75 mg/dL (ref 70–99)
Glucose-Capillary: 81 mg/dL (ref 70–99)
Glucose-Capillary: 90 mg/dL (ref 70–99)
Glucose-Capillary: 91 mg/dL (ref 70–99)

## 2023-12-01 LAB — CBC
HCT: 22.9 % — ABNORMAL LOW (ref 36.0–46.0)
Hemoglobin: 7.5 g/dL — ABNORMAL LOW (ref 12.0–15.0)
MCH: 30.2 pg (ref 26.0–34.0)
MCHC: 32.8 g/dL (ref 30.0–36.0)
MCV: 92.3 fL (ref 80.0–100.0)
Platelets: 27 K/uL — CL (ref 150–400)
RBC: 2.48 MIL/uL — ABNORMAL LOW (ref 3.87–5.11)
RDW: 19.3 % — ABNORMAL HIGH (ref 11.5–15.5)
WBC: 4.1 K/uL (ref 4.0–10.5)
nRBC: 1 % — ABNORMAL HIGH (ref 0.0–0.2)

## 2023-12-01 LAB — PTH, INTACT AND CALCIUM: PTH: 376 pg/mL — ABNORMAL HIGH (ref 15–65)

## 2023-12-01 LAB — MAGNESIUM: Magnesium: 1.6 mg/dL — ABNORMAL LOW (ref 1.7–2.4)

## 2023-12-01 LAB — PHOSPHORUS: Phosphorus: 1.6 mg/dL — ABNORMAL LOW (ref 2.5–4.6)

## 2023-12-01 LAB — VITAMIN C: Vitamin C: 0.3 mg/dL — ABNORMAL LOW (ref 0.4–2.0)

## 2023-12-01 MED ORDER — MAGNESIUM SULFATE 2 GM/50ML IV SOLN
2.0000 g | Freq: Once | INTRAVENOUS | Status: AC
  Administered 2023-12-01: 2 g via INTRAVENOUS
  Filled 2023-12-01: qty 50

## 2023-12-01 MED ORDER — DEXTROSE 5 % IV SOLN
30.0000 mmol | Freq: Once | INTRAVENOUS | Status: AC
  Administered 2023-12-01: 30 mmol via INTRAVENOUS
  Filled 2023-12-01: qty 10

## 2023-12-01 MED ORDER — ONDANSETRON HCL 4 MG/2ML IJ SOLN
4.0000 mg | Freq: Four times a day (QID) | INTRAMUSCULAR | Status: AC | PRN
  Administered 2023-12-02 – 2023-12-03 (×2): 4 mg via INTRAVENOUS
  Filled 2023-12-01 (×2): qty 2

## 2023-12-01 MED ORDER — ENSURE MAX PROTEIN PO LIQD
2.0000 [oz_av] | ORAL | Status: DC
  Administered 2023-12-02 – 2023-12-04 (×7): 2 [oz_av] via ORAL
  Filled 2023-12-01 (×21): qty 330

## 2023-12-01 NOTE — Hospital Course (Addendum)
 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  Admitted for multiple electrolyte abnormalities including hypocalcemia, hypokalemia, hypomagnesemia, prolonged QTc.  Developed sudden abdominal pain early in hospitalization underwent emergent exploratory laparotomy with placement of wound VAC and closure of internal hernia defect.  Postoperatively was sedated and continued on ventilator, subsequently return to the OR for small bowel resection, jejunostomy tube insertion and closure of abdomen.  Required multiple vasopressors for shock.  Eventually weaned off vasopressors and extubated.  Transferred to hospitalist service 11/7.  Consultants General surgery   Procedures/Events 11/1 admission for multiple electrolyte abnormalities 11/2 sudden abdominal pain, CT showed pneumatosis, underwent emergent diagnostic laparoscopy converted to exploratory laparotomy, placement of AbThera VAC (non-disposable DME), closure of internal hernia defect  11/3 CVC insertion 11/4 small bowel resection, jejunostomy tube insertion, closure of abdomen

## 2023-12-01 NOTE — Progress Notes (Signed)
  3 Days Post-Op   Chief Complaint/Subjective: Rates abdominal pain as 7/10. Having diarrhea. Tolerating CLD without reported N/V. TF @ 20 mL/hr.   Objective: Vital signs in last 24 hours: Temp:  [97.1 F (36.2 C)-98.3 F (36.8 C)] 98.3 F (36.8 C) (11/07 1014) Pulse Rate:  [65-79] 74 (11/07 0600) Resp:  [12-24] 13 (11/07 0600) BP: (107-139)/(69-87) 118/84 (11/07 0600) SpO2:  [90 %-100 %] 100 % (11/07 0600) Last BM Date : 12/01/23 Intake/Output from previous day: 11/06 0701 - 11/07 0700 In: 2612.2 [I.V.:1835; NG/GT:230.3; IV Piggyback:546.9] Out: 650 [Urine:650]  PE: Gen: NAD, slow speech Resp: nonlabored Card: RRR Abd: open incision with clean base, fascia in tact. J tube site c/d/I.   Lab Results:  Recent Labs    11/30/23 1715 12/01/23 0500  WBC 6.8 4.1  HGB 7.3* 7.5*  HCT 22.5* 22.9*  PLT 30* 27*   Recent Labs    11/30/23 1715 12/01/23 0500  NA 139 140  K 4.4 3.9  CL 118* 118*  CO2 17* 18*  GLUCOSE 94 93  BUN 8 7  CREATININE 0.58 0.50  CALCIUM 7.1* 7.2*   Recent Labs    11/29/23 1110  LABPROT 18.7*  INR 1.5*      Component Value Date/Time   NA 140 12/01/2023 0500   NA 137 04/22/2019 1634   K 3.9 12/01/2023 0500   CL 118 (H) 12/01/2023 0500   CO2 18 (L) 12/01/2023 0500   GLUCOSE 93 12/01/2023 0500   BUN 7 12/01/2023 0500   BUN 8 04/22/2019 1634   CREATININE 0.50 12/01/2023 0500   CREATININE 0.73 08/02/2016 1237   CALCIUM 7.2 (L) 12/01/2023 0500   PROT 3.9 (L) 11/27/2023 0338   PROT 5.4 (L) 04/22/2019 1634   ALBUMIN 2.7 (L) 11/27/2023 0338   ALBUMIN 3.0 (L) 04/22/2019 1634   AST 56 (H) 11/27/2023 0338   ALT 32 11/27/2023 0338   ALKPHOS 136 (H) 11/27/2023 0338   BILITOT 0.5 11/27/2023 0338   BILITOT 0.3 04/22/2019 1634   GFRNONAA >60 12/01/2023 0500   GFRAA 80 04/22/2019 1634    Assessment/Plan  s/p Procedure(s): LAPAROTOMY, EXPLORATORY EXCISION, SMALL INTESTINE WITH ANASTOMOSIS SECONDARY CLOSURE OF WOUND AND J TUBE CREATION  11/28/2023   FEN - advance j tube feeds q 6h to goal, adv to bari full liquids today VTE - lovenox ID - completed abx with Rocephin /flagyl  Disposition - ICU, improving; ok for transfer to the floor w/ tele from a CCS standpoint    LOS: 6 days   I reviewed last 24 h vitals and pain scores, last 48 h intake and output, last 24 h labs and trends, and last 24 h imaging results.  This care required moderate level of medical decision making.   Almarie RAMAN [facility] 12/01/2023, 10:37 AM Please see Amion for pager number during day hours 7:00am-4:30pm or 7:00am -11:30am on weekends

## 2023-12-01 NOTE — Evaluation (Signed)
 Physical Therapy Evaluation Patient Details Name: Renee [name] MRN: [id number] DOB: 05-19-1975 Today's Date: 12/01/2023  History of Present Illness  Patient is a 48 y/o female admitted 11/25/23 with diarrhea x 7 days and concern for electrolyte deficiency.  Developed severe abdominal pain and CT concern for SBO vs. Ischemic bowel.  Underwent emergent laparotomy 11/26/23 for hernia repair and left with open abdomen, on vent with pressor support then return to OR 11/28/23 for SBR, J tube placement and abdomen closure.  PMH positive for bipolar d/o, ADD, chronic pain, fibromyalgia, HTN, migraines, OSA, DDD, (cervical), polyarthralgia, Herpes simplex, and reports bariatric surgery in 2021.  Clinical Impression  Patient presents with decreased mobility due to generalized weakness, decreased balance, decreased activity tolerance and pain.  Previously mobilizing on her own in the home, though needing some help at times with shower transfers and assist when going out of the home.  Fiance can assist.  Currently mod to max A up to EOB only due to pain.  Patient will continue to benefit from skilled PT in the acute setting and from post-acute inpatient rehab (>3 hours/day) at d/c.         If plan is discharge home, recommend the following: Two people to help with bathing/dressing/bathroom;Two people to help with walking and/or transfers   Can travel by private vehicle        Equipment Recommendations Other (comment) (TBA at next venue)  Recommendations for Other Services  Rehab consult    Functional Status Assessment Patient has had a recent decline in their functional status and demonstrates the ability to make significant improvements in function in a reasonable and predictable amount of time.     Precautions / Restrictions Precautions Precautions: Fall Recall of Precautions/Restrictions: Impaired Precaution/Restrictions Comments: J-tube      Mobility  Bed Mobility Overal bed mobility: Needs  Assistance Bed Mobility: Rolling, Sidelying to Sit, Sit to Supine Rolling: Mod assist Sidelying to sit: Mod assist, Used rails, HOB elevated   Sit to supine: Max assist   General bed mobility comments: used bed pad to help with rolling and cues for using rail, assist for lifting trunk and guiding legs off EOB, assist to supine for legs onto bed and to reposition closer to [facility]    Transfers                   General transfer comment: declined today due to abdominal pain    Ambulation/Gait                  Stairs            Wheelchair Mobility     Tilt Bed    Modified Rankin (Stroke Patients Only)       Balance Overall balance assessment: Needs assistance Sitting-balance support: Feet unsupported Sitting balance-Leahy Scale: Poor Sitting balance - Comments: UE support needed or A for balance while at EOB                                     Pertinent Vitals/Pain Pain Assessment Pain Assessment: 0-10 Pain Score: 8  Pain Location: abdomen, 4 in arm where IV is Pain Descriptors / Indicators: Aching Pain Intervention(s): Monitored during session, Premedicated before session    Home Living Family/patient expects to be discharged to:: Private residence Living Arrangements: Spouse/significant other Available Help at Discharge: Family Type of Home: House Home Access: Stairs to enter Entrance  Stairs-Rails: None Entrance Stairs-Number of Steps: 3   Home Layout: One level Home Equipment: BSC/3in1;Hospital bed;Cane - single point Additional Comments: thinks the [facility] was placed in the attic    Prior Function Prior Level of Function : Needs assist             Mobility Comments: significant other had to help up steps ADLs Comments: significant other was helping with bathing at times     Extremity/Trunk Assessment   Upper Extremity Assessment Upper Extremity Assessment: Generalized weakness    Lower Extremity Assessment Lower  Extremity Assessment: RLE deficits/detail;LLE deficits/detail RLE Deficits / Details: AROM WFL, strength 3/5 or greater except painful with hip flexion due to abdominal incisions LLE Deficits / Details: AROM WFL, strength 3/5 or greater except painful with hip flexion due to abdominal incisions    Cervical / Trunk Assessment Cervical / Trunk Assessment: Other exceptions Cervical / Trunk Exceptions: abdominal surgery  Communication        Cognition Arousal: Alert Behavior During Therapy: WFL for tasks assessed/performed   PT - Cognitive impairments: No apparent impairments                       PT - Cognition Comments: slowed speech Following commands: Intact       Cueing       General Comments General comments (skin integrity, edema, etc.): VSS while on EOB HR 85, SpO2 91% on RA, BP 133/85    Exercises     Assessment/Plan    PT Assessment Patient needs continued PT services  PT Problem List Decreased strength;Decreased activity tolerance;Decreased balance;Decreased mobility;Pain;Decreased safety awareness;Decreased knowledge of use of DME       PT Treatment Interventions DME instruction;Gait training;Stair training;Patient/family education;Functional mobility training;Therapeutic activities;Therapeutic exercise;Balance training    PT Goals (Current goals can be found in the Care Plan section)  Acute Rehab PT Goals Patient Stated Goal: get stronger, return to independent PT Goal Formulation: With patient Time For Goal Achievement: 12/15/23 Potential to Achieve Goals: Fair    Frequency Min 3X/week     Co-evaluation               AM-PAC PT 6 Clicks Mobility  Outcome Measure Help needed turning from your back to your side while in a flat bed without using bedrails?: A Lot Help needed moving from lying on your back to sitting on the side of a flat bed without using bedrails?: A Lot Help needed moving to and from a bed to a chair (including a  wheelchair)?: Total Help needed standing up from a chair using your arms (e.g., wheelchair or bedside chair)?: Total Help needed to walk in hospital room?: Total Help needed climbing 3-5 steps with a railing? : Total 6 Click Score: 8    End of Session   Activity Tolerance: Patient limited by pain Patient left: in bed;with bed alarm set;with call bell/phone within reach   PT Visit Diagnosis: Other abnormalities of gait and mobility (R26.89);Muscle weakness (generalized) (M62.81);Pain Pain - part of body:  (abdomen)    Time: 8962-8888 PT Time Calculation (min) (ACUTE ONLY): 34 min   Charges:   PT Evaluation $PT Eval Moderate Complexity: 1 Mod PT Treatments $Therapeutic Activity: 8-22 mins PT General Charges $$ ACUTE PT VISIT: 1 Visit         Micheline Portal, PT Acute Rehabilitation Services Office:[phone] 12/01/2023   Montie Portal 12/01/2023, 11:50 AM

## 2023-12-01 NOTE — Evaluation (Signed)
 Occupational Therapy Evaluation Patient Details Name: Renee [name] MRN: [id number] DOB: 11/26/1975 Today's Date: 12/01/2023   History of Present Illness   Patient is a 48 y/o female admitted 11/25/23 with diarrhea x 7 days and concern for electrolyte deficiency.  Developed severe abdominal pain and CT concern for SBO vs. Ischemic bowel.  Underwent emergent laparotomy 11/26/23 for hernia repair and left with open abdomen, on vent with pressor support then return to OR 11/28/23 for SBR, J tube placement and abdomen closure.  PMH positive for bipolar d/o, ADD, chronic pain, fibromyalgia, HTN, migraines, OSA, DDD, (cervical), polyarthralgia, Herpes simplex, and reports bariatric surgery in 2021.     Clinical Impressions PTA, patient lives at home with parents and significant other and was relatively indep with BADL's and functional mobility with assist for bathing and on stairs. Currently, patient presents with deficits outlined below (see OT Problem List for details) most significantly pain, decreased activity tolerance, balance, cognition, generalized muscle strength, baseline visual deficits impacting BADL's (max A) and functional mobility performance. Patient will benefit from intensive inpatient follow-up therapy, >3 hours/day. Patient requires continued Acute care hospital level OT services to progress safety and functional performance and allow for discharge.       If plan is discharge home, recommend the following:   Two people to help with walking and/or transfers;A lot of help with bathing/dressing/bathroom;Assistance with cooking/housework;Assistance with feeding;Direct supervision/assist for medications management;Direct supervision/assist for financial management;Assist for transportation;Help with stairs or ramp for entrance;Supervision due to cognitive status     Functional Status Assessment   Patient has had a recent decline in their functional status and demonstrates the ability  to make significant improvements in function in a reasonable and predictable amount of time.     Equipment Recommendations   Wheelchair (measurements OT);Wheelchair cushion (measurements OT)      Precautions/Restrictions   Precautions Precautions: Fall Recall of Precautions/Restrictions: Impaired Precaution/Restrictions Comments: J-tube, abdominal incision Restrictions Weight Bearing Restrictions Per Provider Order: No     Mobility Bed Mobility Overal bed mobility: Needs Assistance Bed Mobility: Rolling, Sidelying to Sit, Sit to Supine Rolling: Mod assist Sidelying to sit: Mod assist, Used rails, HOB elevated       General bed mobility comments: assist for bed pan use, hygiene and bathing and dressing for session    Transfers                   General transfer comment: declined today due to abdominal pain and fatigue      Balance Overall balance assessment:  (bed level ADL's then fatigue and pain limited mobility progression)                                         ADL either performed or assessed with clinical judgement   ADL Overall ADL's : Needs assistance/impaired Eating/Feeding: Minimal assistance;Bed level   Grooming: Wash/dry hands;Wash/dry face;Oral care;Minimal assistance;Bed level Grooming Details (indicate cue type and reason): placed bed in chair position for task Upper Body Bathing: Maximal assistance;Bed level   Lower Body Bathing: Total assistance;Bed level   Upper Body Dressing : Maximal assistance;Bed level   Lower Body Dressing: Total assistance;Bed level       Toileting- Clothing Manipulation and Hygiene: Maximal assistance;Bed level Toileting - Clothing Manipulation Details (indicate cue type and reason): vooided and used bed pan     Functional mobility during ADLs: Maximal assistance (bed  level with rails and bed features) General ADL Comments: pain and activity tolerance limited session     Vision  Baseline Vision/History: 1 Wears glasses Ability to See in Adequate Light: 1 Impaired Patient Visual Report: No change from baseline;Other (comment) (Retinal surgery on L eye with eye and vision loss at baseline)       Perception Perception: Impaired Preception Impairment Details: Figure ground, Spatial orientation Perception-Other Comments: loss of depth perception due to monocular vision in R       Pertinent Vitals/Pain Pain Assessment Pain Assessment: 0-10 Pain Score: 9  Facial Expression: Relaxed, neutral Body Movements: Restlessness Muscle Tension: Relaxed Compliance with ventilator (intubated pts.): N/A Vocalization (extubated pts.): Sighing, moaning CPOT Total: 3 Pain Location: abdomen, arm Pain Descriptors / Indicators: Aching, Guarding, Grimacing, Discomfort Pain Intervention(s): Limited activity within patient's tolerance, Monitored during session, Premedicated before session, Repositioned, Relaxation     Extremity/Trunk Assessment Upper Extremity Assessment Upper Extremity Assessment: Generalized weakness;Right hand dominant;RUE deficits/detail;LUE deficits/detail RUE Coordination: decreased fine motor;decreased gross motor LUE Coordination: decreased fine motor;decreased gross motor   Lower Extremity Assessment Lower Extremity Assessment: Defer to PT evaluation   Cervical / Trunk Assessment Cervical / Trunk Assessment: Other exceptions Cervical / Trunk Exceptions: abdominal surgery   Communication Communication Communication: No apparent difficulties   Cognition Arousal: Alert Behavior During Therapy: Lability Cognition: Cognition impaired   Orientation impairments: Time, Situation Awareness: Intellectual awareness impaired, Online awareness impaired Memory impairment (select all impairments): Short-term memory Attention impairment (select first level of impairment): Sustained attention Executive functioning impairment (select all impairments):  Organization, Sequencing, Reasoning, Problem solving OT - Cognition Comments: responds best with concise conversation, emotionally labile at times, decreased processing and insight                 Following commands: Impaired Following commands impaired: Follows one step commands with increased time     Cueing  General Comments   Cueing Techniques: Verbal cues;Gestural cues  HR 88, BP 110/72, SpO2 100% on RA throughout session           Home Living Family/patient expects to be discharged to:: Private residence Living Arrangements: Spouse/significant other Available Help at Discharge: Family Type of Home: House Home Access: Stairs to enter Secretary/administrator of Steps: 3 Entrance Stairs-Rails: None Home Layout: One level     Bathroom Shower/Tub: Chief Strategy Officer: Standard     Home Equipment: BSC/3in1;Hospital bed;Cane - single point   Additional Comments: thinks the [facility] was placed in the attic      Prior Functioning/Environment Prior Level of Function : Needs assist             Mobility Comments: significant other had to help up steps ADLs Comments: significant other was helping with bathing at times    OT Problem List: Decreased strength;Decreased activity tolerance;Impaired balance (sitting and/or standing);Impaired vision/perception;Decreased coordination;Decreased cognition;Decreased safety awareness;Decreased knowledge of use of DME or AE;Decreased knowledge of precautions;Cardiopulmonary status limiting activity;Obesity;Impaired UE functional use;Pain;Increased edema   OT Treatment/Interventions: Self-care/ADL training;Therapeutic exercise;Neuromuscular education;Energy conservation;DME and/or AE instruction;Therapeutic activities;Cognitive remediation/compensation;Visual/perceptual remediation/compensation;Patient/family education;Balance training      OT Goals(Current goals can be found in the care plan section)   Acute Rehab  OT Goals Patient Stated Goal: to get my life back OT Goal Formulation: With patient Time For Goal Achievement: 12/15/23 Potential to Achieve Goals: Good ADL Goals Pt Will Perform Upper Body Bathing: with contact guard assist;sitting Pt Will Perform Lower Body Bathing: with max assist;sit to/from stand;with adaptive equipment  Pt Will Perform Upper Body Dressing: with min assist;sitting Pt Will Perform Lower Body Dressing: with max assist;sit to/from stand;with adaptive equipment Pt Will Transfer to Toilet: with mod assist;with +2 assist;stand pivot transfer;bedside commode Pt Will Perform Toileting - Clothing Manipulation and hygiene: with max assist;sitting/lateral leans Pt/caregiver will Perform Home Exercise Program: Increased strength;Both right and left upper extremity;With written HEP provided;With Supervision   OT Frequency:  Min 3X/week       AM-PAC OT 6 Clicks Daily Activity     Outcome Measure Help from another person eating meals?: A Little Help from another person taking care of personal grooming?: A Lot Help from another person toileting, which includes using toliet, bedpan, or urinal?: A Lot Help from another person bathing (including washing, rinsing, drying)?: A Lot Help from another person to put on and taking off regular upper body clothing?: A Lot Help from another person to put on and taking off regular lower body clothing?: Total 6 Click Score: 12   End of Session Equipment Utilized During Treatment:  (bed features utilized) Nurse Communication: Mobility status;Other (comment) (bowel info)  Activity Tolerance: Patient limited by fatigue;Patient limited by pain Patient left: in bed;with call bell/phone within reach;with bed alarm set;with nursing/sitter in room;with SCD's reapplied  OT Visit Diagnosis: Unsteadiness on feet (R26.81);Other abnormalities of gait and mobility (R26.89);Muscle weakness (generalized) (M62.81);Cognitive communication deficit  (R41.841);Pain Pain - part of body:  (abdominal)                Time: 1400-1440 OT Time Calculation (min): 40 min Charges:  OT General Charges $OT Visit: 1 Visit OT Evaluation $OT Eval Low Complexity: 1 Low OT Treatments $Self Care/Home Management : 8-22 mins  Piper Hassebrock OT/L Acute Rehabilitation Department  [phone]  12/01/2023, 4:27 PM

## 2023-12-01 NOTE — Progress Notes (Signed)
 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  Admitted for multiple electrolyte abnormalities including hypocalcemia, hypokalemia, hypomagnesemia, prolonged QTc.  Developed sudden abdominal pain early in hospitalization underwent emergent exploratory laparotomy with placement of wound VAC and closure of internal hernia defect.  Postoperatively was sedated and continued on ventilator, subsequently return to the OR for small bowel resection, jejunostomy tube insertion and closure of abdomen.  Required multiple vasopressors for shock.  Eventually weaned off vasopressors and extubated.  Transferred to hospitalist service 11/7.  Consultants General surgery   Procedures/Events 11/1 admission for multiple electrolyte abnormalities 11/2 sudden abdominal pain, CT showed pneumatosis, underwent emergent diagnostic laparoscopy converted to exploratory laparotomy, placement of AbThera VAC (non-disposable DME), closure of internal hernia defect  11/3 CVC insertion 11/4 small bowel resection, jejunostomy tube insertion, closure of abdomen  Assessment and Plan: Mesenteric ischemia Pseudo-Petersen's internal hernia PMH bariatric surgery Distributive shock Metabolic acidosis S/p PJT Clinically stabilizing.  Shock resolved.  Status post surgeries as above. Management as per general surgery.  Okay to transfer out of stepdown unit today.   Anemia Thrombocytopenia Multifactorial anemia, nutritional, perioperative, ABLA.  Stable today.  Follow CBC daily. Platelets trending down, likely secondary to recent shock.  Enoxaparin stopped.  Hypocalcemia Hypokalemia Hypomagnesemia Hypophosphatemia Micronutrient deficiencies including copper , vitamin E, zinc  Secondary  to bariatric surgery. Replete, follow.  Severe hypothyroidism TSH 147 on admission.  T3 low.  T4 very low.  Noncompliant with levothyroxine .  Secondary to inability to pay for it.  Moderate malnutrition Overweight Per dietician Body mass index is 29.69 kg/m.  PTSD Anxiety Bipolar disorder ADD Fibromyalgia  Complex social issues   Adrenal insufficiency? Unclear if she ever followed up for this, apparently steroids were considered on her previous [facility] admission, may need outpatient endocrinology evaluation No hypotension.  Follow-up as an outpatient.    Subjective:  Feels ok today, has some abdominal pain  Physical Exam: Vitals:   12/01/23 0400 12/01/23 0500 12/01/23 0600 12/01/23 1014  BP: 107/73 107/75 118/84   Pulse: 73 79 74   Resp: 13 14 13    Temp: 98 F (36.7 C)   98.3 F (36.8 C)  TempSrc: Axillary   Oral  SpO2: 98% 98% 100%   Weight:      Height:       Physical Exam Vitals reviewed.  Constitutional:      General: She is not in acute distress.    Appearance: She is not ill-appearing or toxic-appearing.  Cardiovascular:     Rate and Rhythm: Normal rate and regular rhythm.     Heart sounds: No murmur heard. Pulmonary:     Effort: Pulmonary effort is normal. No respiratory distress.     Breath sounds: No wheezing, rhonchi or rales.  Neurological:     Mental Status: She is alert.  Psychiatric:        Mood and Affect: Mood normal.        Behavior: Behavior normal.     Data Reviewed: CBG with 1 low CO2 stable at 18 Phosphorus low 1.6 Magnesium  low 1.6 Calcium low 7.2 Hemoglobin stable at 7.5 Platelets trending down 27  Family Communication:   Disposition: Status is: Inpatient Remains inpatient appropriate because: s/p surgery     Time spent: 45 minutes  Author: Toribio Door, MD 12/01/2023  12:32 PM  For on call review [email or web address].

## 2023-12-01 NOTE — Progress Notes (Signed)
  Progress Note   Date: 11/27/2023  Patient Name: Renee [name]        MRN#: [id number]  Clarification of the diagnosis of respiratory failure:       Acute respiratory failure has been ruled out, post op vent management only

## 2023-12-01 NOTE — Progress Notes (Signed)
 Inpatient Rehab Admissions Coordinator:   Per therapy recommendations pt was screened for CIR by Reche Lowers, PT, DPT.  Pt eval limited by abdominal pain.  I do think she will likely progress with mobility as pain control improves, so I will place an order for rehab consult and f/u with pt on Monday to see how she's doing.  We would not have a bed for this patient to admit to CIR until mid week next week.   Reche Lowers, PT, DPT Admissions Coordinator [phone] 12/01/23 1:16 PM

## 2023-12-02 DIAGNOSIS — D696 Thrombocytopenia, unspecified: Secondary | ICD-10-CM | POA: Diagnosis not present

## 2023-12-02 LAB — PHOSPHORUS
Phosphorus: 1.1 mg/dL — ABNORMAL LOW (ref 2.5–4.6)
Phosphorus: 2 mg/dL — ABNORMAL LOW (ref 2.5–4.6)
Phosphorus: 1.7 mg/dL — ABNORMAL LOW (ref 2.5–4.6)

## 2023-12-02 LAB — GLUCOSE, CAPILLARY
Glucose-Capillary: 100 mg/dL — ABNORMAL HIGH (ref 70–99)
Glucose-Capillary: 100 mg/dL — ABNORMAL HIGH (ref 70–99)
Glucose-Capillary: 102 mg/dL — ABNORMAL HIGH (ref 70–99)
Glucose-Capillary: 104 mg/dL — ABNORMAL HIGH (ref 70–99)
Glucose-Capillary: 70 mg/dL (ref 70–99)
Glucose-Capillary: 81 mg/dL (ref 70–99)
Glucose-Capillary: 99 mg/dL (ref 70–99)

## 2023-12-02 LAB — COMPREHENSIVE METABOLIC PANEL WITH GFR
ALT: 22 U/L (ref 0–44)
AST: 23 U/L (ref 15–41)
Albumin: 2.3 g/dL — ABNORMAL LOW (ref 3.5–5.0)
Alkaline Phosphatase: 154 U/L — ABNORMAL HIGH (ref 38–126)
Anion gap: 4 — ABNORMAL LOW (ref 5–15)
BUN: 6 mg/dL (ref 6–20)
CO2: 19 mmol/L — ABNORMAL LOW (ref 22–32)
Calcium: 6.4 mg/dL — CL (ref 8.9–10.3)
Chloride: 115 mmol/L — ABNORMAL HIGH (ref 98–111)
Creatinine, Ser: 0.39 mg/dL — ABNORMAL LOW (ref 0.44–1.00)
GFR, Estimated: >60 mL/min (ref >60)
Glucose, Bld: 79 mg/dL (ref 70–99)
Potassium: 3.7 mmol/L (ref 3.5–5.1)
Sodium: 138 mmol/L (ref 135–145)
Total Bilirubin: 0.4 mg/dL (ref 0.0–1.2)
Total Protein: 4.2 g/dL — ABNORMAL LOW (ref 6.5–8.1)

## 2023-12-02 LAB — CBC WITH DIFFERENTIAL/PLATELET
Abs Immature Granulocytes: 0.11 K/uL — ABNORMAL HIGH (ref 0.00–0.07)
Basophils Absolute: 0.1 K/uL (ref 0.0–0.1)
Basophils Relative: 1 %
Eosinophils Absolute: 0 K/uL (ref 0.0–0.5)
Eosinophils Relative: 0 %
HCT: 23.7 % — ABNORMAL LOW (ref 36.0–46.0)
Hemoglobin: 7.9 g/dL — ABNORMAL LOW (ref 12.0–15.0)
Immature Granulocytes: 2 %
Lymphocytes Relative: 56 %
Lymphs Abs: 3 K/uL (ref 0.7–4.0)
MCH: 30.4 pg (ref 26.0–34.0)
MCHC: 33.3 g/dL (ref 30.0–36.0)
MCV: 91.2 fL (ref 80.0–100.0)
Monocytes Absolute: 0.5 K/uL (ref 0.1–1.0)
Monocytes Relative: 9 %
Neutro Abs: 1.7 K/uL (ref 1.7–7.7)
Neutrophils Relative %: 32 %
Platelets: 31 K/uL — ABNORMAL LOW (ref 150–400)
RBC: 2.6 MIL/uL — ABNORMAL LOW (ref 3.87–5.11)
RDW: 19.1 % — ABNORMAL HIGH (ref 11.5–15.5)
WBC: 5.3 K/uL (ref 4.0–10.5)
nRBC: 1.9 % — ABNORMAL HIGH (ref 0.0–0.2)

## 2023-12-02 LAB — BASIC METABOLIC PANEL WITH GFR
Anion gap: 3 — ABNORMAL LOW (ref 5–15)
BUN: 7 mg/dL (ref 6–20)
CO2: 20 mmol/L — ABNORMAL LOW (ref 22–32)
Calcium: 7 mg/dL — ABNORMAL LOW (ref 8.9–10.3)
Chloride: 114 mmol/L — ABNORMAL HIGH (ref 98–111)
Creatinine, Ser: 0.4 mg/dL — ABNORMAL LOW (ref 0.44–1.00)
GFR, Estimated: >60 mL/min (ref >60)
Glucose, Bld: 89 mg/dL (ref 70–99)
Potassium: 4.3 mmol/L (ref 3.5–5.1)
Sodium: 137 mmol/L (ref 135–145)

## 2023-12-02 LAB — MAGNESIUM
Magnesium: 1.6 mg/dL — ABNORMAL LOW (ref 1.7–2.4)
Magnesium: 2 mg/dL (ref 1.7–2.4)
Magnesium: 1.9 mg/dL (ref 1.7–2.4)

## 2023-12-02 LAB — CULTURE, BLOOD (ROUTINE X 2)
Culture: NO GROWTH
Culture: NO GROWTH

## 2023-12-02 LAB — VITAMIN B1: Vitamin B1 (Thiamine): 158.4 nmol/L (ref 66.5–200.0)

## 2023-12-02 MED ORDER — MAGNESIUM SULFATE 4 GM/100ML IV SOLN
4.0000 g | Freq: Once | INTRAVENOUS | Status: AC
  Administered 2023-12-02: 4 g via INTRAVENOUS
  Filled 2023-12-02: qty 100

## 2023-12-02 MED ORDER — POTASSIUM PHOSPHATES 15 MMOLE/5ML IV SOLN
30.0000 mmol | Freq: Once | INTRAVENOUS | Status: AC
  Administered 2023-12-02: 30 mmol via INTRAVENOUS
  Filled 2023-12-02: qty 10

## 2023-12-02 MED ORDER — MELATONIN 3 MG PO TABS
3.0000 mg | ORAL_TABLET | Freq: Every day | ORAL | Status: DC
  Administered 2023-12-02 – 2023-12-10 (×9): 3 mg via ORAL
  Filled 2023-12-02 (×11): qty 1

## 2023-12-02 MED ORDER — THIAMINE MONONITRATE 100 MG PO TABS
100.0000 mg | ORAL_TABLET | Freq: Every day | ORAL | Status: DC
  Administered 2023-12-02 – 2023-12-04 (×3): 100 mg via ORAL
  Filled 2023-12-02 (×3): qty 1

## 2023-12-02 MED ORDER — FOLIC ACID 1 MG PO TABS
1.0000 mg | ORAL_TABLET | Freq: Every day | ORAL | Status: DC
  Administered 2023-12-02 – 2023-12-11 (×10): 1 mg via ORAL
  Filled 2023-12-02 (×10): qty 1

## 2023-12-02 MED ORDER — LEVOTHYROXINE SODIUM 100 MCG PO TABS
200.0000 ug | ORAL_TABLET | Freq: Every day | ORAL | Status: DC
  Administered 2023-12-02 – 2023-12-11 (×10): 200 ug via ORAL
  Filled 2023-12-02 (×10): qty 2

## 2023-12-02 MED ORDER — SODIUM CHLORIDE 0.9 % IV SOLN
0.5000 mg/kg/h | INTRAVENOUS | Status: DC
  Administered 2023-12-02 – 2023-12-04 (×4): 1 mg/kg/h via INTRAVENOUS
  Administered 2023-12-04 – 2023-12-07 (×3): 0.5 mg/kg/h via INTRAVENOUS
  Filled 2023-12-02 (×10): qty 100

## 2023-12-02 MED ORDER — CALCIUM GLUCONATE-NACL 2-0.675 GM/100ML-% IV SOLN
2.0000 g | Freq: Once | INTRAVENOUS | Status: AC
  Administered 2023-12-02: 2000 mg via INTRAVENOUS
  Filled 2023-12-02: qty 100

## 2023-12-02 MED ORDER — FENTANYL CITRATE (PF) 50 MCG/ML IJ SOSY
25.0000 ug | PREFILLED_SYRINGE | Freq: Once | INTRAMUSCULAR | Status: AC
  Administered 2023-12-02: 25 ug via INTRAVENOUS
  Filled 2023-12-02: qty 1

## 2023-12-02 MED ORDER — PANTOPRAZOLE SODIUM 40 MG PO TBEC
40.0000 mg | DELAYED_RELEASE_TABLET | Freq: Every day | ORAL | Status: DC
  Administered 2023-12-02 – 2023-12-10 (×9): 40 mg via ORAL
  Filled 2023-12-02 (×8): qty 1

## 2023-12-02 MED ORDER — SODIUM CHLORIDE 0.9% FLUSH
10.0000 mL | INTRAVENOUS | Status: DC | PRN
  Administered 2023-12-04: 30 mL

## 2023-12-02 NOTE — Progress Notes (Signed)
       Overnight   NAME: Renee [name] MRN: [id number] DOB : Jun 02, 1975    Date of Service   12/02/2023   HPI/Events of Note    Notified by RN for concern over surgical wound.  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 Admitted through the ER with hand cramping multiple electrolyte abnormalities. Developed sudden abdominal pain early in hospitalization underwent emergent exploratory laparotomy with placement of a wound VAC and closure of internal hernia defect. Required pressors postoperatively for shock. And off pressors and extubated transferred to hospitalist 11/7.  Appears to be sutures and secondary closure.   Bedside visit RN explains that she has discovered that patient has been pulling herself up in bed. Prior to this current onset of abdominal pain, patient had pulled herself up and felt movement at the site, RN had removed bandage to find open incision with clean base, fascia intact, J-tube site clean dry.    Appears to have been the sensation caused by a slipped bandage.   No obvious external drainage or other identifiable problem of surgical site  Lab values rescheduled for now.  Pain medication ordered for now.   Abdominal pain level currently is at or slightly less of surgical noted assessment earlier.  Patient no obvious or stated distress and the aforementioned pain level, which is ongoing.   Interventions/ Plan   Lab draw was rescheduled for now Pain medication single dose now, resume as needed. Redress surgical wound so you what I am just put as previous.      Lynwood Kipper BSN MSNA MSN ACNPC-AG Acute Care Nurse Practitioner [facility]

## 2023-12-02 NOTE — Progress Notes (Signed)
 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  Admitted for multiple electrolyte abnormalities including hypocalcemia, hypokalemia, hypomagnesemia, prolonged QTc.  Developed sudden abdominal pain early in hospitalization underwent emergent exploratory laparotomy with placement of wound VAC and closure of internal hernia defect.  Postoperatively was sedated and continued on ventilator, subsequently return to the OR for small bowel resection, jejunostomy tube insertion and closure of abdomen.  Required multiple vasopressors for shock.  Eventually weaned off vasopressors and extubated.  Transferred to hospitalist service 11/7.  Consultants General surgery   Procedures/Events 11/1 admission for multiple electrolyte abnormalities 11/2 sudden abdominal pain, CT showed pneumatosis, underwent emergent diagnostic laparoscopy converted to exploratory laparotomy, placement of AbThera VAC (non-disposable DME), closure of internal hernia defect  11/3 CVC insertion 11/4 small bowel resection, jejunostomy tube insertion, closure of abdomen  Assessment and Plan: Mesenteric ischemia Pseudo-Petersen's internal hernia PMH bariatric surgery Distributive shock Metabolic acidosis S/p PJT Clinically stabilizing.  Shock resolved.  Status post surgeries as above. Management as per general surgery.  Diet advanced.   Anemia Thrombocytopenia Multifactorial anemia, nutritional, perioperative, ABLA.   Hemoglobin stable at 7.9.  Platelets stabilizing at 31.   Hypocalcemia Hypokalemia Hypomagnesemia Hypophosphatemia Micronutrient deficiencies including copper , vitamin E, zinc  Secondary to bariatric surgery. Continue aggressive repletion of calcium, phosphorus and  magnesium .   Severe hypothyroidism TSH 147 on admission.  T3 low.  T4 very low.  Noncompliant with levothyroxine .  Secondary to inability to pay for it.   Moderate malnutrition Overweight Per dietician Body mass index is 29.69 kg/m.   PTSD Anxiety Bipolar disorder ADD Fibromyalgia   Complex social issues   Adrenal insufficiency? Unclear if she ever followed up for this, apparently steroids were considered on her previous [facility] admission, may need outpatient endocrinology evaluation No hypotension.  Follow-up as an outpatient.       Subjective:  Had a rough night, was worried wound had opened.  Physical Exam: Vitals:   12/01/23 1640 12/01/23 2019 12/02/23 0236 12/02/23 0456  BP: 121/84 108/85 123/80 93/70  Pulse: 79 81 88 84  Resp: 18 20 18 15   Temp: 98.8 F (37.1 C) 98.1 F (36.7 C) 97.9 F (36.6 C) 97.7 F (36.5 C)  TempSrc:    Oral  SpO2: 100% 98% 100% 97%  Weight:      Height:       Physical Exam Vitals reviewed.  Constitutional:      General: She is not in acute distress.    Appearance: She is not ill-appearing or toxic-appearing.  Cardiovascular:     Rate and Rhythm: Normal rate and regular rhythm.     Heart sounds: No murmur heard. Pulmonary:     Effort: Pulmonary effort is normal. No respiratory distress.     Breath sounds: No wheezing, rhonchi or rales.  Neurological:     Mental Status: She is alert.  Psychiatric:        Mood and Affect: Mood normal.        Behavior: Behavior normal.     Data Reviewed: CBG stable Corrected calcium 7.8 Phosphorus 1.1 Magnesium  1.6 Platelets stabilizing, 31 Hemoglobin stable 7.9  Family Communication: none  Disposition: Status is: Inpatient Remains inpatient appropriate because: s/p surgery     Time spent: 20 minutes  Author: Toribio Door, MD 12/02/2023 8:20  AM  For on call review [email or web address].

## 2023-12-02 NOTE — Progress Notes (Signed)
 4 Days Post-Op   Subjective/Chief Complaint: Complains of pain unchanged.    Objective: Vital signs in last 24 hours: Temp:  [97.7 F (36.5 C)-98.8 F (37.1 C)] 97.7 F (36.5 C) (11/08 0456) Pulse Rate:  [73-88] 84 (11/08 0456) Resp:  [14-22] 15 (11/08 0456) BP: (93-134)/(70-92) 93/70 (11/08 0456) SpO2:  [97 %-100 %] 97 % (11/08 0456) Last BM Date : 12/01/23  Intake/Output from previous day: 11/07 0701 - 11/08 0700 In: 6332.9 [I.V.:4751.3; NG/GT:1581.7] Out: -  Intake/Output this shift: No intake/output data recorded.  General appearance: alert and cooperative. Slowed mentation Resp: clear to auscultation bilaterally Cardio: regular rate and rhythm GI: soft, mild tenderness. Wound clean. Fascia intact  Lab Results:  Recent Labs    12/01/23 0500 12/02/23 0647  WBC 4.1 5.3  HGB 7.5* 7.9*  HCT 22.9* 23.7*  PLT 27* 31*   BMET Recent Labs    11/30/23 1715 12/01/23 0500  NA 139 140  K 4.4 3.9  CL 118* 118*  CO2 17* 18*  GLUCOSE 94 93  BUN 8 7  CREATININE 0.58 0.50  CALCIUM 7.1* 7.2*   PT/INR Recent Labs    11/29/23 1110  LABPROT 18.7*  INR 1.5*   ABG No results for input(s): PHART, HCO3 in the last 72 hours.  Invalid input(s): PCO2, PO2  Studies/Results: No results found.  Anti-infectives: Anti-infectives (From admission, onward)    Start     Dose/Rate Route Frequency Ordered Stop   11/26/23 2300  cefTRIAXone  (ROCEPHIN ) 2 g in sodium chloride  0.9 % 100 mL IVPB        2 g 200 mL/hr over 30 Minutes Intravenous Every 24 hours 11/26/23 2210 12/01/23 0028   11/26/23 2300  metroNIDAZOLE  (FLAGYL ) IVPB 500 mg        500 mg 100 mL/hr over 60 Minutes Intravenous Every 12 hours 11/26/23 2210 11/30/23 2347       Assessment/Plan: s/p Procedure(s) with comments: LAPAROTOMY, EXPLORATORY (N/A) - Exploratory Laparotomy, Possible Bowel Resection EXCISION, SMALL INTESTINE WITH ANASTOMOSIS SECONDARY CLOSURE OF WOUND AND J TUBE CREATION Advance  diet FEN - advance j tube feeds q 6h to goal, adv to bari full liquids today VTE - lovenox ID - completed abx with Rocephin /flagyl  Disposition - floor Thrombocytopenia per medicine  LOS: 7 days    Renee [name] 12/02/2023

## 2023-12-03 DIAGNOSIS — E876 Hypokalemia: Secondary | ICD-10-CM | POA: Diagnosis not present

## 2023-12-03 DIAGNOSIS — D696 Thrombocytopenia, unspecified: Secondary | ICD-10-CM | POA: Diagnosis not present

## 2023-12-03 LAB — COMPREHENSIVE METABOLIC PANEL WITH GFR
ALT: 66 U/L — ABNORMAL HIGH (ref 0–44)
AST: 59 U/L — ABNORMAL HIGH (ref 15–41)
Albumin: 2.1 g/dL — ABNORMAL LOW (ref 3.5–5.0)
Alkaline Phosphatase: 297 U/L — ABNORMAL HIGH (ref 38–126)
Anion gap: 5 (ref 5–15)
BUN: <5 mg/dL — ABNORMAL LOW (ref 6–20)
CO2: 22 mmol/L (ref 22–32)
Calcium: 7.5 mg/dL — ABNORMAL LOW (ref 8.9–10.3)
Chloride: 113 mmol/L — ABNORMAL HIGH (ref 98–111)
Creatinine, Ser: 0.77 mg/dL (ref 0.44–1.00)
GFR, Estimated: >60 mL/min (ref >60)
Glucose, Bld: 82 mg/dL (ref 70–99)
Potassium: 3.5 mmol/L (ref 3.5–5.1)
Sodium: 140 mmol/L (ref 135–145)
Total Bilirubin: 0.8 mg/dL (ref 0.0–1.2)
Total Protein: 4.3 g/dL — ABNORMAL LOW (ref 6.5–8.1)

## 2023-12-03 LAB — BASIC METABOLIC PANEL WITH GFR
Anion gap: 2 — ABNORMAL LOW (ref 5–15)
BUN: 7 mg/dL (ref 6–20)
CO2: 20 mmol/L — ABNORMAL LOW (ref 22–32)
Calcium: 7.7 mg/dL — ABNORMAL LOW (ref 8.9–10.3)
Chloride: 113 mmol/L — ABNORMAL HIGH (ref 98–111)
Creatinine, Ser: 0.35 mg/dL — ABNORMAL LOW (ref 0.44–1.00)
GFR, Estimated: >60 mL/min (ref >60)
Glucose, Bld: 108 mg/dL — ABNORMAL HIGH (ref 70–99)
Potassium: 5.1 mmol/L (ref 3.5–5.1)
Sodium: 136 mmol/L (ref 135–145)

## 2023-12-03 LAB — MAGNESIUM
Magnesium: 1.7 mg/dL (ref 1.7–2.4)
Magnesium: 1.7 mg/dL (ref 1.7–2.4)

## 2023-12-03 LAB — GLUCOSE, CAPILLARY
Glucose-Capillary: 107 mg/dL — ABNORMAL HIGH (ref 70–99)
Glucose-Capillary: 103 mg/dL — ABNORMAL HIGH (ref 70–99)
Glucose-Capillary: 84 mg/dL (ref 70–99)
Glucose-Capillary: 103 mg/dL — ABNORMAL HIGH (ref 70–99)

## 2023-12-03 LAB — PHOSPHORUS
Phosphorus: 2.6 mg/dL (ref 2.5–4.6)
Phosphorus: 1.9 mg/dL — ABNORMAL LOW (ref 2.5–4.6)

## 2023-12-03 LAB — VITAMIN A: Vitamin A (Retinoic Acid): 2.6 ug/dL — ABNORMAL LOW (ref 20.1–62.0)

## 2023-12-03 MED ORDER — FENTANYL CITRATE (PF) 50 MCG/ML IJ SOSY
25.0000 ug | PREFILLED_SYRINGE | INTRAMUSCULAR | Status: DC | PRN
  Administered 2023-12-03 – 2023-12-11 (×30): 50 ug via INTRAVENOUS
  Filled 2023-12-03 (×31): qty 1

## 2023-12-03 MED ORDER — ACETAMINOPHEN 500 MG PO TABS
1000.0000 mg | ORAL_TABLET | Freq: Three times a day (TID) | ORAL | Status: DC
  Administered 2023-12-03 – 2023-12-04 (×4): 1000 mg
  Filled 2023-12-03 (×4): qty 2

## 2023-12-03 MED ORDER — FENTANYL CITRATE (PF) 50 MCG/ML IJ SOSY: 25.0000 ug | PREFILLED_SYRINGE | INTRAMUSCULAR | Status: DC | PRN

## 2023-12-03 MED ORDER — MAGNESIUM SULFATE 2 GM/50ML IV SOLN
2.0000 g | Freq: Once | INTRAVENOUS | Status: AC
  Administered 2023-12-03: 2 g via INTRAVENOUS
  Filled 2023-12-03: qty 50

## 2023-12-03 MED ORDER — POTASSIUM CHLORIDE 20 MEQ PO PACK
40.0000 meq | PACK | Freq: Once | ORAL | Status: AC
  Administered 2023-12-03: 40 meq via ORAL
  Filled 2023-12-03: qty 2

## 2023-12-03 MED ORDER — OXYCODONE HCL 5 MG PO TABS
5.0000 mg | ORAL_TABLET | Freq: Four times a day (QID) | ORAL | Status: DC | PRN
  Administered 2023-12-03 – 2023-12-06 (×10): 10 mg
  Filled 2023-12-03 (×10): qty 2

## 2023-12-03 MED ORDER — ONDANSETRON HCL 4 MG/2ML IJ SOLN
4.0000 mg | Freq: Four times a day (QID) | INTRAMUSCULAR | Status: DC | PRN
  Administered 2023-12-03 – 2023-12-06 (×3): 4 mg via INTRAVENOUS
  Filled 2023-12-03 (×3): qty 2

## 2023-12-03 NOTE — Plan of Care (Signed)

## 2023-12-03 NOTE — Progress Notes (Signed)
 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  Admitted for multiple electrolyte abnormalities including hypocalcemia, hypokalemia, hypomagnesemia, prolonged QTc.  Developed sudden abdominal pain early in hospitalization underwent emergent exploratory laparotomy with placement of wound VAC and closure of internal hernia defect.  Postoperatively was sedated and continued on ventilator, subsequently return to the OR for small bowel resection, jejunostomy tube insertion and closure of abdomen.  Required multiple vasopressors for shock.  Eventually weaned off vasopressors and extubated.  Transferred to hospitalist service 11/7.  Consultants General surgery   Procedures/Events 11/1 admission for multiple electrolyte abnormalities 11/2 sudden abdominal pain, CT showed pneumatosis, underwent emergent diagnostic laparoscopy converted to exploratory laparotomy, placement of AbThera VAC (non-disposable DME), closure of internal hernia defect  11/3 CVC insertion 11/4 small bowel resection, jejunostomy tube insertion, closure of abdomen  Assessment and Plan: Mesenteric ischemia Pseudo-Petersen's internal hernia PMH bariatric surgery Distributive shock Metabolic acidosis S/p PJT Stabilized.  Shock resolved.  Status post surgeries as above. Management as per general surgery.  Diet advanced.   Anemia Thrombocytopenia Multifactorial anemia, nutritional, perioperative, ABLA.   CBC in a.m.   Hypocalcemia Hypokalemia Hypomagnesemia Hypophosphatemia Micronutrient deficiencies including copper , vitamin E, zinc  Secondary to bariatric surgery. Continue aggressive repletion.  Corrected calcium is now normal, potassium borderline normal.  Magnesium  borderline  normal.  Phosphorus within normal limits.   Severe hypothyroidism TSH 147 on admission.  T3 low.  T4 very low.  Noncompliant with levothyroxine .  Secondary to inability to pay for it. Check TSH first week in December   Moderate malnutrition Overweight Per dietician Body mass index is 29.69 kg/m.   PTSD Anxiety Bipolar disorder ADD Fibromyalgia   Complex social issues   Adrenal insufficiency? Unclear if she ever followed up for this, apparently steroids were considered on her previous [facility] admission, may need outpatient endocrinology evaluation No hypotension.  Follow-up as an outpatient.      Subjective:  Rough night again Pain uncontrolled  Physical Exam: Vitals:   12/02/23 0701 12/02/23 1327 12/02/23 1952 12/03/23 0700  BP:  99/72 125/79   Pulse:  79 87   Resp:   15   Temp:  97.9 F (36.6 C) 97.7 F (36.5 C)   TempSrc:      SpO2:  98% 98%   Weight: 94.6 kg   94.8 kg  Height:       Physical Exam Vitals reviewed.  Constitutional:      General: She is not in acute distress.    Appearance: She is not ill-appearing (chronically) or toxic-appearing.  Cardiovascular:     Rate and Rhythm: Normal rate and regular rhythm.     Heart sounds: No murmur heard. Pulmonary:     Effort: Pulmonary effort is normal. No respiratory distress.     Breath sounds: No wheezing, rhonchi or rales.  Neurological:     Mental Status: She is alert.  Psychiatric:        Mood and Affect: Mood normal.     Data Reviewed: CBG stable Potassium 3.5 Corrected calcium 9.0 Phos WNL Mg2+ 1.7  Family Communication: none  Disposition: Status is: Inpatient Remains inpatient appropriate because: s/p surgery     Time spent: 20 minutes  Author: Toribio Door, MD 12/03/2023 11:02 AM  For on call review [email or web address].

## 2023-12-03 NOTE — Plan of Care (Signed)
 Pain management greatly improved today, otherwise uneventful shift.

## 2023-12-03 NOTE — Progress Notes (Signed)
 5 Days Post-Op   Subjective/Chief Complaint: No new complaints.  Resting comfortably   Objective: Vital signs in last 24 hours: Temp:  [97.7 F (36.5 C)-97.9 F (36.6 C)] 97.7 F (36.5 C) (11/08 1952) Pulse Rate:  [79-87] 87 (11/08 1952) Resp:  [15] 15 (11/08 1952) BP: (99-125)/(72-79) 125/79 (11/08 1952) SpO2:  [98 %] 98 % (11/08 1952) Weight:  [94.8 kg] 94.8 kg (11/09 0700) Last BM Date : 12/02/23  Intake/Output from previous day: 11/08 0701 - 11/09 0700 In: 3612.3 [P.O.:478; I.V.:1659.8; NG/GT:900; IV Piggyback:574.5] Out: 3450 [Urine:3450] Intake/Output this shift: No intake/output data recorded.  General appearance: alert and cooperative Resp: clear to auscultation bilaterally Cardio: regular rate and rhythm GI: Soft, mild tenderness.  Wound clean  Lab Results:  Recent Labs    12/01/23 0500 12/02/23 0647  WBC 4.1 5.3  HGB 7.5* 7.9*  HCT 22.9* 23.7*  PLT 27* 31*   BMET Recent Labs    12/02/23 1703 12/03/23 0252  NA 137 140  K 4.3 3.5  CL 114* 113*  CO2 20* 22  GLUCOSE 89 82  BUN 7 <5*  CREATININE 0.40* 0.77  CALCIUM 7.0* 7.5*   PT/INR No results for input(s): LABPROT, INR in the last 72 hours. ABG No results for input(s): PHART, HCO3 in the last 72 hours.  Invalid input(s): PCO2, PO2  Studies/Results: No results found.  Anti-infectives: Anti-infectives (From admission, onward)    Start     Dose/Rate Route Frequency Ordered Stop   11/26/23 2300  cefTRIAXone  (ROCEPHIN ) 2 g in sodium chloride  0.9 % 100 mL IVPB        2 g 200 mL/hr over 30 Minutes Intravenous Every 24 hours 11/26/23 2210 12/01/23 0028   11/26/23 2300  metroNIDAZOLE  (FLAGYL ) IVPB 500 mg        500 mg 100 mL/hr over 60 Minutes Intravenous Every 12 hours 11/26/23 2210 11/30/23 2347       Assessment/Plan: s/p Procedure(s) with comments: LAPAROTOMY, EXPLORATORY (N/A) - Exploratory Laparotomy, Possible Bowel Resection EXCISION, SMALL INTESTINE WITH  ANASTOMOSIS SECONDARY CLOSURE OF WOUND AND J TUBE CREATION Advance diet FEN - advance j tube feeds q 6h to goal, adv to bari full liquids today VTE - lovenox ID - completed abx with Rocephin /flagyl  Disposition - floor Thrombocytopenia per medicine  LOS: 8 days    Renee [name] 12/03/2023

## 2023-12-04 DIAGNOSIS — D696 Thrombocytopenia, unspecified: Secondary | ICD-10-CM | POA: Diagnosis not present

## 2023-12-04 LAB — CBC
HCT: 21.9 % — ABNORMAL LOW (ref 36.0–46.0)
Hemoglobin: 7 g/dL — ABNORMAL LOW (ref 12.0–15.0)
MCH: 30.4 pg (ref 26.0–34.0)
MCHC: 32 g/dL (ref 30.0–36.0)
MCV: 95.2 fL (ref 80.0–100.0)
Platelets: 88 K/uL — ABNORMAL LOW (ref 150–400)
RBC: 2.3 MIL/uL — ABNORMAL LOW (ref 3.87–5.11)
RDW: 19 % — ABNORMAL HIGH (ref 11.5–15.5)
WBC: 7.9 K/uL (ref 4.0–10.5)
nRBC: 0.9 % — ABNORMAL HIGH (ref 0.0–0.2)

## 2023-12-04 LAB — PHOSPHORUS
Phosphorus: 1.9 mg/dL — ABNORMAL LOW (ref 2.5–4.6)
Phosphorus: 3.3 mg/dL (ref 2.5–4.6)

## 2023-12-04 LAB — BASIC METABOLIC PANEL WITH GFR
Anion gap: 4 — ABNORMAL LOW (ref 5–15)
BUN: 9 mg/dL (ref 6–20)
CO2: 23 mmol/L (ref 22–32)
Calcium: 7.3 mg/dL — ABNORMAL LOW (ref 8.9–10.3)
Chloride: 106 mmol/L (ref 98–111)
Creatinine, Ser: 0.36 mg/dL — ABNORMAL LOW (ref 0.44–1.00)
GFR, Estimated: >60 mL/min (ref >60)
Glucose, Bld: 84 mg/dL (ref 70–99)
Potassium: 5.2 mmol/L — ABNORMAL HIGH (ref 3.5–5.1)
Sodium: 133 mmol/L — ABNORMAL LOW (ref 135–145)

## 2023-12-04 LAB — COMPREHENSIVE METABOLIC PANEL WITH GFR
ALT: 43 U/L (ref 0–44)
AST: 133 U/L — ABNORMAL HIGH (ref 15–41)
Albumin: 1.8 g/dL — ABNORMAL LOW (ref 3.5–5.0)
Alkaline Phosphatase: 397 U/L — ABNORMAL HIGH (ref 38–126)
Anion gap: 3 — ABNORMAL LOW (ref 5–15)
BUN: 7 mg/dL (ref 6–20)
CO2: 21 mmol/L — ABNORMAL LOW (ref 22–32)
Calcium: 7.7 mg/dL — ABNORMAL LOW (ref 8.9–10.3)
Chloride: 110 mmol/L (ref 98–111)
Creatinine, Ser: 0.35 mg/dL — ABNORMAL LOW (ref 0.44–1.00)
GFR, Estimated: >60 mL/min (ref >60)
Glucose, Bld: 101 mg/dL — ABNORMAL HIGH (ref 70–99)
Potassium: 5.2 mmol/L — ABNORMAL HIGH (ref 3.5–5.1)
Sodium: 134 mmol/L — ABNORMAL LOW (ref 135–145)
Total Bilirubin: 0.3 mg/dL (ref 0.0–1.2)
Total Protein: 4.2 g/dL — ABNORMAL LOW (ref 6.5–8.1)

## 2023-12-04 LAB — GLUCOSE, CAPILLARY
Glucose-Capillary: 114 mg/dL — ABNORMAL HIGH (ref 70–99)
Glucose-Capillary: 73 mg/dL (ref 70–99)
Glucose-Capillary: 81 mg/dL (ref 70–99)
Glucose-Capillary: 85 mg/dL (ref 70–99)
Glucose-Capillary: 92 mg/dL (ref 70–99)
Glucose-Capillary: 94 mg/dL (ref 70–99)

## 2023-12-04 LAB — MAGNESIUM
Magnesium: 1.6 mg/dL — ABNORMAL LOW (ref 1.7–2.4)
Magnesium: 1.8 mg/dL (ref 1.7–2.4)

## 2023-12-04 MED ORDER — FUROSEMIDE 20 MG PO TABS
20.0000 mg | ORAL_TABLET | Freq: Every day | ORAL | Status: DC
  Administered 2023-12-05 – 2023-12-06 (×2): 20 mg via ORAL
  Filled 2023-12-04 (×2): qty 1

## 2023-12-04 MED ORDER — ENSURE MAX PROTEIN PO LIQD
11.0000 [oz_av] | Freq: Every day | ORAL | Status: DC
  Administered 2023-12-05 – 2023-12-07 (×3): 11 [oz_av] via ORAL
  Filled 2023-12-04 (×3): qty 330

## 2023-12-04 MED ORDER — VITAMIN A 3 MG (10000 UNIT) PO CAPS
50000.0000 [IU] | ORAL_CAPSULE | Freq: Every day | ORAL | Status: DC
  Administered 2023-12-08 – 2023-12-11 (×4): 50000 [IU] via ORAL
  Filled 2023-12-04 (×4): qty 5

## 2023-12-04 MED ORDER — FUROSEMIDE 10 MG/ML IJ SOLN
20.0000 mg | Freq: Once | INTRAMUSCULAR | Status: AC
  Administered 2023-12-04: 20 mg via INTRAVENOUS
  Filled 2023-12-04: qty 2

## 2023-12-04 MED ORDER — VITAMIN A 3 MG (10000 UNIT) PO CAPS
100000.0000 [IU] | ORAL_CAPSULE | Freq: Every day | ORAL | Status: AC
  Administered 2023-12-05 – 2023-12-07 (×3): 100000 [IU] via ORAL
  Filled 2023-12-04 (×3): qty 10

## 2023-12-04 MED ORDER — SODIUM CHLORIDE 0.9 % IV SOLN: INTRAVENOUS | Status: AC

## 2023-12-04 MED ORDER — VITAMIN A 3 MG (10000 UNIT) PO CAPS: 10000.0000 [IU] | ORAL_CAPSULE | Freq: Every day | ORAL | Status: DC

## 2023-12-04 MED ORDER — ADULT MULTIVITAMIN W/MINERALS CH
1.0000 | ORAL_TABLET | Freq: Every day | ORAL | Status: DC
  Administered 2023-12-04 – 2023-12-10 (×6): 1 via ORAL
  Filled 2023-12-04 (×5): qty 1

## 2023-12-04 MED ORDER — SODIUM PHOSPHATES 45 MMOLE/15ML IV SOLN
30.0000 mmol | Freq: Once | INTRAVENOUS | Status: AC
  Administered 2023-12-04: 30 mmol via INTRAVENOUS
  Filled 2023-12-04: qty 10

## 2023-12-04 MED ORDER — ACETAMINOPHEN 325 MG PO TABS
650.0000 mg | ORAL_TABLET | Freq: Four times a day (QID) | ORAL | Status: DC | PRN
Start: 2023-12-04 — End: 2023-12-11
  Administered 2023-12-07 – 2023-12-09 (×5): 650 mg
  Filled 2023-12-04 (×6): qty 2

## 2023-12-04 MED ORDER — POTASSIUM PHOSPHATES 15 MMOLE/5ML IV SOLN: 30.0000 mmol | Freq: Once | INTRAVENOUS | Status: DC

## 2023-12-04 MED ORDER — CALCIUM CITRATE 950 (200 CA) MG PO TABS
200.0000 mg | ORAL_TABLET | Freq: Three times a day (TID) | ORAL | Status: DC
  Administered 2023-12-04 – 2023-12-06 (×7): 950 mg
  Filled 2023-12-04 (×7): qty 1

## 2023-12-04 MED ORDER — MAGNESIUM SULFATE 4 GM/100ML IV SOLN
4.0000 g | Freq: Once | INTRAVENOUS | Status: AC
  Administered 2023-12-04: 4 g via INTRAVENOUS
  Filled 2023-12-04: qty 100

## 2023-12-04 MED ORDER — VITAMIN D (ERGOCALCIFEROL) 1.25 MG (50000 UNIT) PO CAPS
50000.0000 [IU] | ORAL_CAPSULE | ORAL | Status: DC
  Administered 2023-12-04: 50000 [IU] via ORAL
  Filled 2023-12-04: qty 1

## 2023-12-04 NOTE — Progress Notes (Signed)
 Progress Note  6 Days Post-Op  Subjective: Patient reports that pain has improved. Having watery bowel movements and flatulence. Tolerated soup and jello. Had some nausea but no vomiting.  ROS  All negative with the exception of above.  Objective: Vital signs in last 24 hours: Temp:  [97.6 F (36.4 C)-98 F (36.7 C)] 98 F (36.7 C) (11/10 0900) Pulse Rate:  [79-84] 84 (11/10 0900) Resp:  [16-18] 16 (11/10 0900) BP: (94-108)/(66-75) 103/66 (11/10 0900) SpO2:  [98 %-100 %] 98 % (11/10 0900) Weight:  [94.3 kg] 94.3 kg (11/10 0434) Last BM Date : 12/03/23  Intake/Output from previous day: 11/09 0701 - 11/10 0700 In: 60  Out: 2725 [Urine:2725] Intake/Output this shift: No intake/output data recorded.  PE: General: Pleasant female who is laying in bed in NAD. HEENT: Head is normocephalic, atraumatic.   Heart: HR normal during encounter. Lungs: Respiratory effort nonlabored. Abd: Soft, NT, ND. Surrounding area of abdominal wound clean and dry. Packing was not removed as we agreed to further assess wound during dressing exchange with nursing staff. Will try and arrange time tomorrow. No rebound tenderness or guarding.  Skin: Warm and dry.   Lab Results:  Recent Labs    12/02/23 0647 12/04/23 0258  WBC 5.3 7.9  HGB 7.9* 7.0*  HCT 23.7* 21.9*  PLT 31* 88*   BMET Recent Labs    12/03/23 1710 12/04/23 0258  NA 136 134*  K 5.1 5.2*  CL 113* 110  CO2 20* 21*  GLUCOSE 108* 101*  BUN 7 7  CREATININE 0.35* 0.35*  CALCIUM 7.7* 7.7*   PT/INR No results for input(s): LABPROT, INR in the last 72 hours. CMP     Component Value Date/Time   NA 134 (L) 12/04/2023 0258   NA 137 04/22/2019 1634   K 5.2 (H) 12/04/2023 0258   CL 110 12/04/2023 0258   CO2 21 (L) 12/04/2023 0258   GLUCOSE 101 (H) 12/04/2023 0258   BUN 7 12/04/2023 0258   BUN 8 04/22/2019 1634   CREATININE 0.35 (L) 12/04/2023 0258   CREATININE 0.73 08/02/2016 1237   CALCIUM 7.7 (L) 12/04/2023 0258    CALCIUM INTEDT 11/25/2023 1529   PROT 4.2 (L) 12/04/2023 0258   PROT 5.4 (L) 04/22/2019 1634   ALBUMIN 1.8 (L) 12/04/2023 0258   ALBUMIN 3.0 (L) 04/22/2019 1634   AST 133 (H) 12/04/2023 0258   ALT 43 12/04/2023 0258   ALKPHOS 397 (H) 12/04/2023 0258   BILITOT 0.3 12/04/2023 0258   BILITOT 0.3 04/22/2019 1634   GFRNONAA >60 12/04/2023 0258   GFRAA 80 04/22/2019 1634   Lipase     Component Value Date/Time   LIPASE 82 (H) 03/08/2022 1940       Studies/Results: No results found.  Anti-infectives: Anti-infectives (From admission, onward)    Start     Dose/Rate Route Frequency Ordered Stop   11/26/23 2300  cefTRIAXone  (ROCEPHIN ) 2 g in sodium chloride  0.9 % 100 mL IVPB        2 g 200 mL/hr over 30 Minutes Intravenous Every 24 hours 11/26/23 2210 12/01/23 0028   11/26/23 2300  metroNIDAZOLE  (FLAGYL ) IVPB 500 mg        500 mg 100 mL/hr over 60 Minutes Intravenous Every 12 hours 11/26/23 2210 11/30/23 2347        Assessment/Plan POD 6: LAPAROTOMY, EXPLORATORY (N/A) - Exploratory Laparotomy, Possible Bowel Resection EXCISION, SMALL INTESTINE WITH ANASTOMOSIS SECONDARY CLOSURE OF WOUND AND J TUBE CREATION with Dr. Stevie 11/4. -  Vitals stable -WBC 7.9 and HGB 7.0 f 7.9; Continue to monitor HGB. -Tolerated FLD. Will plan to advance diet -Will continue to follow.  FEN: Carb modified; Advancement of j tube feeds q6h to goal was 11/9. VTE: None currently ID: None.   LOS: 9 days   I reviewed hospitalist notes, nursing notes, last 24 h vitals and pain scores, last 48 h intake and output, last 24 h labs and trends, and last 24 h imaging results.   Marjorie Carlyon Favre, [facility] Surgery 12/04/2023, 12:09 PM Please see Amion for pager number during day hours 7:00am-4:30pm

## 2023-12-04 NOTE — Progress Notes (Signed)
 Inpatient Rehab Admissions Coordinator:   CIR following, will see how she does with PT/OT now that pain is better managed and assess for candidacy.   Leita Kleine, MS, CCC-SLP Rehab Admissions Coordinator  [phone] (celll) [phone] (office)

## 2023-12-04 NOTE — TOC Progression Note (Signed)
 Transition of Care [facility]) - Progression Note    Patient Details  Name: Renee [name] MRN: [id number] Date of Birth: [birth year]  Transition of Care [facility]) CM/SW Contact  Doneta Glenys DASEN, RN Phone Number: 12/04/2023, 3:45 PM  Clinical Narrative:    Per Glade Doffing Rehab Admission Coordinator for CIR will see how she does with PT/OT now that pain is better managed and assess for candidacy.   Barriers to Discharge: Continued Medical Work up, Other (must enter comment) (Having procedure today/ On the ventilator)               Expected Discharge Plan and Services In-house Referral: NA Discharge Planning Services: CM Consult Post Acute Care Choice: NA Living arrangements for the past 2 months: Single Family Home                 DME Arranged: N/A DME Agency: NA       HH Arranged: NA HH Agency: NA         Social Drivers of Health (SDOH) Interventions SDOH Screenings   Food Insecurity: Food Insecurity Present (11/25/2023)  Housing: High Risk (11/25/2023)  Transportation Needs: No Transportation Needs (11/25/2023)  Utilities: At Risk (11/25/2023)  Depression (PHQ2-9): Low Risk  (08/21/2020)  Financial Resource Strain: High Risk (11/03/2022)   Received from [facility]  Tobacco Use: Medium Risk (11/25/2023)    Readmission Risk Interventions    11/28/2023    9:46 AM  Readmission Risk Prevention Plan  Transportation Screening Complete  PCP or Specialist Appt within 5-7 Days Complete  Home Care Screening Complete  Medication Review (RN CM) Complete

## 2023-12-04 NOTE — Plan of Care (Signed)
  Problem: Education: Goal: Knowledge of General Education information will improve Description: Including pain rating scale, medication(s)/side effects and non-pharmacologic comfort measures Outcome: Progressing   Problem: Health Behavior/Discharge Planning: Goal: Ability to manage health-related needs will improve Outcome: Progressing   Problem: Clinical Measurements: Goal: Will remain free from infection Outcome: Progressing Goal: Diagnostic test results will improve Outcome: Progressing   Problem: Nutrition: Goal: Adequate nutrition will be maintained Outcome: Progressing   Problem: Coping: Goal: Level of anxiety will decrease Outcome: Progressing   Problem: Elimination: Goal: Will not experience complications related to bowel motility Outcome: Progressing   Problem: Pain Managment: Goal: General experience of comfort will improve and/or be controlled Outcome: Progressing   Problem: Safety: Goal: Ability to remain free from injury will improve Outcome: Progressing

## 2023-12-04 NOTE — Progress Notes (Signed)
 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  Admitted for multiple electrolyte abnormalities including hypocalcemia, hypokalemia, hypomagnesemia, prolonged QTc.  Developed sudden abdominal pain early in hospitalization underwent emergent exploratory laparotomy with placement of wound VAC and closure of internal hernia defect.  Postoperatively was sedated and continued on ventilator, subsequently return to the OR for small bowel resection, jejunostomy tube insertion and closure of abdomen.  Required multiple vasopressors for shock.  Eventually weaned off vasopressors and extubated.  Transferred to hospitalist service 11/7.  Consultants General surgery   Procedures/Events 11/1 admission for multiple electrolyte abnormalities 11/2 sudden abdominal pain, CT showed pneumatosis, underwent emergent diagnostic laparoscopy converted to exploratory laparotomy, placement of AbThera VAC (non-disposable DME), closure of internal hernia defect  11/3 CVC insertion 11/4 small bowel resection, jejunostomy tube insertion, closure of abdomen  Assessment and Plan: Mesenteric ischemia Pseudo-Petersen's internal hernia PMH bariatric surgery Distributive shock Metabolic acidosis S/p PJT Stabilized.  Shock resolved.  Status post surgeries as above. Management as per general surgery.  Diet advanced.   Anemia Thrombocytopenia Multifactorial anemia, nutritional, perioperative, ABLA.   Platelets improving.  Hemoglobin down somewhat.  Repeat in the a.m.  If less than 7, transfuse.   Hypocalcemia Hypokalemia Hypomagnesemia Hypophosphatemia Micronutrient deficiencies including copper , vitamin E, zinc  Secondary to bariatric surgery. Potassium slightly high today,  monitor.  Phosphorus and magnesium  low again, replete.  Calcium stable compared to yesterday.  Continue repletion.   Severe hypothyroidism TSH 147 on admission.  T3 low.  T4 very low.  Noncompliant with levothyroxine .  Secondary to inability to pay for it. Check TSH first week in December   Moderate malnutrition Overweight Per dietician Body mass index is 29.69 kg/m.   PTSD Anxiety Bipolar disorder ADD Fibromyalgia   Complex social issues   Adrenal insufficiency? Unclear if she ever followed up for this, apparently steroids were considered on her previous [facility] admission, may need outpatient endocrinology evaluation No hypotension.  Follow-up as an outpatient.      Subjective:  Feels better, pain is controlled.  Physical Exam: Vitals:   12/03/23 2024 12/04/23 0249 12/04/23 0434 12/04/23 0900  BP: 99/73 107/74 108/75 103/66  Pulse: 79 82 81 84  Resp: 18 18 18 16   Temp: 97.6 F (36.4 C)  98 F (36.7 C) 98 F (36.7 C)  TempSrc:   Axillary Oral  SpO2: 98% 100% 98% 98%  Weight:   94.3 kg   Height:       Physical Exam Vitals reviewed.  Constitutional:      General: She is not in acute distress.    Appearance: She is not ill-appearing or toxic-appearing.  Cardiovascular:     Rate and Rhythm: Normal rate and regular rhythm.     Heart sounds: No murmur heard. Pulmonary:     Effort: Pulmonary effort is normal. No respiratory distress.     Breath sounds: No wheezing, rhonchi or rales.  Neurological:     Mental Status: She is alert.  Psychiatric:        Mood and Affect: Mood normal.        Behavior: Behavior normal.     Data Reviewed: CBG stable Potassium 5.2 Phosphorus 1.9 Magnesium  1.6 Hemoglobin 7.0 Platelets up to 88  Family Communication: none  Disposition: Status is: Inpatient Remains inpatient appropriate  because: complex care, electrolyte abnormalities     Time spent: 20 minutes  Author: Toribio Door, MD 12/04/2023 9:45 AM  For on  call review [email or web address].

## 2023-12-04 NOTE — Progress Notes (Signed)
 I attest to student documentation.  Brek Reece V. Tashaun Obey, MSN-RN Nursing Faculty/Clinical Instructor [facility] Associate Degree Nursing Program

## 2023-12-04 NOTE — Progress Notes (Signed)
 Nutrition Follow-up  DOCUMENTATION CODES:   Non-severe (moderate) malnutrition in context of chronic illness  INTERVENTION:  - Continue goal TF regimen: Vital 1.5 at 50 ml/h (1200 ml per day) Prosource TF20 60 ml daily Provides 1880 kcal, 101 gm protein, 917 ml free water daily   - Monitor magnesium , potassium, and phosphorus daily for at least 3 days, MD to replete as needed.   - Carb Modified diet per MD. - Ensure Max po daily, provides 150 kcal and 30 grams of protein.    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  - Vitamin B12: WNL - Thiamine : WNL - Vitamin K WNL - Iron: WNL - CRP: 3.4 (HIGH)  - Starting supplementation today as below: - Vitamin D : < 4.2 (LOW) -  50,000 units weekly x8 weeks then re-check - Vitamin A : 2.6 (LOW)  starting tomorrow - 100,000 international units daily x3 days followed by 50,000 international units daily x14 days followed by 10,000 international units daily x14 days then re-check - Multivitamin with minerals daily.  - Will plan to start further supplementation as below on 11/14 to decrease pill burden. 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 *ongoing  GOAL:   Patient will  meet greater than or equal to 90% of their needs *met with TF and oral diet  MONITOR:   Vent status, Labs, Weight trends  REASON FOR ASSESSMENT:   Consult Assessment of nutrition requirement/status (severe malnutrition; history of gastric bypass convered to duodenal switch and has not been taking vitamins)  ASSESSMENT:   48 y.o. 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 Admitted for low electrolytes and low calcium.  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 11/5 Trickle TF (71mL/hr) initiated via J-tube; Patient self extubated overnight 11/6 CLD 11/7 Bariatric FLD 11/10 Carb Modified diet   Patient receiving patient care at time of visit.  Her tube feeds are noted to be at goal of 76mL/hr.  She is documented to have been consuming 20-100% of meals while on bariatric full liquids. However, did not have a breakfast today. Surgery ordered patient Ensure Max 2oz Q4H and patient occasionally accepting it but no more than 3 times a day. Will order just 1 Ensure Max daily for patient to sip on if desired.  All vitamin/mineral labs checked have now resulted. Patient is now on an oral diet and  can receive oral meds. Discussed with attending MD and can initiate supplementation of deficient vitamins and minerals.   Discussed with pharmacy and no IV options for repletion available. Thankfully, all vitamins/mineral repletion's needed are in stock besides selenium, which [facility] has and [facility] pharmacy can get from them.  Given patient has so many deficiencies and will  have to take several pills daily, will stagger start her supplementation to decrease pill burden. Starting vitamin D  today then weekly from there. Tomorrow, will start vitamin A  (she will need to take 10 capsules daily for 3 days to get repletion dose). After she has completed this, will start to order other repletion doses.    Admit weight: 155# -> 142# Current weight: 207# (suspect elevated d/t fluid) I&O's: +24.4L since admit + for mild pitting generalized, RLE/LLE, and RUE/LUE edema    Medications reviewed and include: 1mg  folic acid , 100mg  thiamine , Protonix    Labs reviewed:  Na 134 K+ 5.2 Phosphorus 1.9 Magnesium  1.6   Vitamins/Minerals: Vitamin B12: 570 (WNL) Thiamine : WNL Vitamin K: WNL Iron: WNL CRP: 3.4 (HIGH) Vitamin D : < 4.2 (LOW)  Vitamin E: 3.0 (LOW)  Zinc : 20 (LOW)  Copper : 29 (LOW)  Selenium: 52 (LOW)  Vitamin A : 2.6 (LOW) Vitamin C: 0.3 (LOW)  Diet Order:   Diet Order             Diet Carb Modified Fluid consistency: Thin; Room service appropriate? Yes  Diet effective now                   EDUCATION NEEDS:  Not appropriate for education at this time  Skin:  Skin Assessment: Skin Integrity Issues: Skin Integrity Issues:: Incisions Incisions: Abdomen  Last BM:  11/9 - type 6  Height:  Ht Readings from Last 1 Encounters:  11/28/23 5' 7 (1.702 m)   Weight:  Wt Readings from Last 1 Encounters:  12/04/23 94.3 kg   Ideal Body Weight:  61.36 kg  BMI:  Body mass index is 32.55 kg/m.  Estimated Nutritional Needs:  Kcal:  1800-2100 kcals Protein:  90-115 grams Fluid:  >/= 1.8L    [name] RD, LDN Contact via Secure Chat.

## 2023-12-04 NOTE — Progress Notes (Signed)
 Occupational Therapy Treatment Patient Details Name: Renee [name] MRN: [id number] DOB: 1975-08-28 Today's Date: 12/04/2023   History of present illness Patient is a 48 y/o female admitted 11/25/23 with diarrhea x 7 days and concern for electrolyte deficiency.  Developed severe abdominal pain and CT concern for SBO vs. Ischemic bowel.  Underwent emergent laparotomy 11/26/23 for hernia repair and left with open abdomen, on vent with pressor support then return to OR 11/28/23 for SBR, J tube placement and abdomen closure.  PMH positive for bipolar d/o, ADD, chronic pain, fibromyalgia, HTN, migraines, OSA, DDD, (cervical), polyarthralgia, Herpes simplex, and reports bariatric surgery in 2021.   OT comments  Patient seen for skilled OT session this afternoon. Progressed light ADL's and therex EOB level. Pain level and tolerance improved from last session however patient deferred OOB until next session. Open to chair position at end of session to promote progression of upright tolerance. Motivated to participate with pain management strategies employed. Discharge recommendation remains appropriate. Patient requires continued Acute care hospital level OT services to progress safety and functional performance and allow for discharge.        If plan is discharge home, recommend the following:  Two people to help with walking and/or transfers;A lot of help with bathing/dressing/bathroom;Assistance with cooking/housework;Assistance with feeding;Direct supervision/assist for medications management;Direct supervision/assist for financial management;Assist for transportation;Help with stairs or ramp for entrance;Supervision due to cognitive status   Equipment Recommendations  Wheelchair (measurements OT);Wheelchair cushion (measurements OT)       Precautions / Restrictions Precautions Precautions: Fall Recall of Precautions/Restrictions: Intact Precaution/Restrictions Comments: J-tube, abdominal  incision Restrictions Weight Bearing Restrictions Per Provider Order: No       Mobility Bed Mobility Overal bed mobility: Needs Assistance Bed Mobility: Rolling, Sidelying to Sit, Sit to Supine Rolling: Mod assist, +2 for safety/equipment Sidelying to sit: Mod assist, +2 for safety/equipment, +2 for physical assistance   Sit to supine: +2 for safety/equipment, Mod assist   General bed mobility comments: pt rolled to L x 1 and to R x 2 with good assist from LEs, pt hugged pillow for comfort; assist to raise trunk; pt sat EOB x 4 minutes without external trunk support, sitting tolerance limited by pain    Transfers                   General transfer comment: declined today due to abdominal pain and fatigue     Balance Overall balance assessment: Needs assistance (EOB ADL's then fatigue and pain limited mobility progression) Sitting-balance support: Feet supported, Single extremity supported Sitting balance-Leahy Scale: Fair Sitting balance - Comments: UE support needed or A for balance while at EOB                                   ADL either performed or assessed with clinical judgement   ADL Overall ADL's : Needs assistance/impaired Eating/Feeding: Set up;Sitting (spoon and cup to mouth EOB)   Grooming: Wash/dry hands;Wash/dry face;Oral care;Minimal assistance;Bed level Grooming Details (indicate cue type and reason): EOB for simple tasks Upper Body Bathing: Moderate assistance;Sitting   Lower Body Bathing: Total assistance;Bed level   Upper Body Dressing : Maximal assistance;Moderate assistance;Sitting Upper Body Dressing Details (indicate cue type and reason): EOB Lower Body Dressing: Total assistance;Bed level   Toilet Transfer:  (deferred this session due to pain)   Toileting- Clothing Manipulation and Hygiene: Maximal assistance;Bed level Toileting - Clothing Manipulation Details (indicate cue  type and reason): voiding bed level      Functional mobility during ADLs: Moderate assistance;+2 for physical assistance;Cueing for sequencing (use of rails and bed features for EOB sitting and scooting laterally) General ADL Comments: pain and activity tolerance limited session to EOB activity but significant increase in overall tolerance from IE    Extremity/Trunk Assessment Upper Extremity Assessment Upper Extremity Assessment: Generalized weakness;Right hand dominant RUE Coordination: decreased fine motor;decreased gross motor LUE Coordination: decreased fine motor;decreased gross motor   Lower Extremity Assessment Lower Extremity Assessment: Defer to PT evaluation           Perception Perception Perception: Impaired Preception Impairment Details: Figure ground;Spatial orientation      Communication Communication Communication: No apparent difficulties   Cognition Arousal: Alert Behavior During Therapy: Lability Cognition: Cognition impaired   Orientation impairments: Time, Situation Awareness: Intellectual awareness impaired, Online awareness impaired Memory impairment (select all impairments): Short-term memory Attention impairment (select first level of impairment): Sustained attention Executive functioning impairment (select all impairments): Organization, Sequencing, Reasoning, Problem solving OT - Cognition Comments: continues to present with intermittent emotionally labilility at times, needs increased time to rationalize benefits of mobility but open and agreeable; decreased processing and insight                 Following commands: Impaired Following commands impaired: Follows one step commands with increased time      Cueing   Cueing Techniques: Verbal cues, Gestural cues  Exercises Exercises: Other exercises (B UE scapular retraction 10 reps x 2 sets EOB)       General Comments no SOB throughout session, educated in use of abdominal pillow hug for pain management and support, abdominal  dressing intact    Pertinent Vitals/ Pain       Pain Assessment Pain Assessment: 0-10 Pain Score: 8  Facial Expression: Relaxed, neutral Body Movements: Restlessness Muscle Tension: Relaxed Compliance with ventilator (intubated pts.): N/A Vocalization (extubated pts.): Sighing, moaning CPOT Total: 3 Pain Location: abdomen Pain Descriptors / Indicators: Aching, Guarding, Grimacing, Discomfort, Operative site guarding Pain Intervention(s): Limited activity within patient's tolerance, Monitored during session, Premedicated before session, Repositioned, Relaxation   Frequency  Min 3X/week        Progress Toward Goals  OT Goals(current goals can now be found in the care plan section)  Progress towards OT goals: Progressing toward goals  Acute Rehab OT Goals Patient Stated Goal: to keep progressing to get home OT Goal Formulation: With patient Time For Goal Achievement: 12/15/23 Potential to Achieve Goals: Good ADL Goals Pt Will Perform Upper Body Bathing: with contact guard assist;sitting Pt Will Perform Lower Body Bathing: with max assist;sit to/from stand;with adaptive equipment Pt Will Perform Upper Body Dressing: with min assist;sitting Pt Will Perform Lower Body Dressing: with max assist;sit to/from stand;with adaptive equipment Pt Will Transfer to Toilet: with mod assist;with +2 assist;stand pivot transfer;bedside commode Pt Will Perform Toileting - Clothing Manipulation and hygiene: with max assist;sitting/lateral leans Pt/caregiver will Perform Home Exercise Program: Increased strength;Both right and left upper extremity;With written HEP provided;With Supervision  Plan      Co-evaluation      Reason for Co-Treatment: Complexity of the patient's impairments (multi-system involvement);For patient/therapist safety;To address functional/ADL transfers PT goals addressed during session: Mobility/safety with mobility;Balance;Strengthening/ROM        AM-PAC OT 6  Clicks Daily Activity     Outcome Measure   Help from another person eating meals?: A Little Help from another person taking care of personal grooming?: A Little  Help from another person toileting, which includes using toliet, bedpan, or urinal?: A Lot Help from another person bathing (including washing, rinsing, drying)?: A Lot Help from another person to put on and taking off regular upper body clothing?: A Lot Help from another person to put on and taking off regular lower body clothing?: Total 6 Click Score: 13    End of Session Equipment Utilized During Treatment:  (bed features and pillow support to abdomen utilized)  OT Visit Diagnosis: Unsteadiness on feet (R26.81);Other abnormalities of gait and mobility (R26.89);Muscle weakness (generalized) (M62.81);Cognitive communication deficit (R41.841);Pain Pain - part of body:  (abdominal)   Activity Tolerance Patient limited by fatigue;Patient limited by pain   Patient Left in bed;with call bell/phone within reach;with bed alarm set;with nursing/sitter in room;with SCD's reapplied   Nurse Communication Mobility status;Patient requests pain meds        Time: 8597-8556 OT Time Calculation (min): 41 min  Charges: OT General Charges $OT Visit: 1 Visit  Verlene Glantz OT/L Acute Rehabilitation Department  [phone]  12/04/2023, 4:27 PM

## 2023-12-04 NOTE — Progress Notes (Signed)
 Physical Therapy Treatment Patient Details Name: Renee [name] MRN: [id number] DOB: April 09, 1975 Today's Date: 12/04/2023   History of Present Illness Patient is a 48 y/o female admitted 11/25/23 with diarrhea x 7 days and concern for electrolyte deficiency.  Developed severe abdominal pain and CT concern for SBO vs. Ischemic bowel.  Underwent emergent laparotomy 11/26/23 for hernia repair and left with open abdomen, on vent with pressor support then return to OR 11/28/23 for SBR, J tube placement and abdomen closure.  PMH positive for bipolar d/o, ADD, chronic pain, fibromyalgia, HTN, migraines, OSA, DDD, (cervical), polyarthralgia, Herpes simplex, and reports bariatric surgery in 2021.    PT Comments  Improved activity tolerance this session. Pt was able to roll L and R with mod assist and perform sidelying to sit with mod assist to raise trunk. Pt sat edge of bed 4 minutes without external trunk support, pain limited sitting tolerance. Pt performed knee extension AROM sitting EOB and with bed in chair position for strengthening. Bed left in chair position at end of session. Educated pt on benefit of progressing tolerance of trunk being more vertical.  Encouraged pt to perform short arc quads and ankle pumps independently. With encouragement, pt put forth good effort.    If plan is discharge home, recommend the following: Two people to help with bathing/dressing/bathroom;Two people to help with walking and/or transfers;Assist for transportation;Help with stairs or ramp for entrance   Can travel by private vehicle        Equipment Recommendations  Other (comment) (TBD at next venue)    Recommendations for Other Services Rehab consult     Precautions / Restrictions Precautions Precautions: Fall Recall of Precautions/Restrictions: Intact Precaution/Restrictions Comments: J-tube, abdominal incision Restrictions Weight Bearing Restrictions Per Provider Order: No     Mobility  Bed Mobility    Bed Mobility: Rolling, Sidelying to Sit, Sit to Supine Rolling: Mod assist, +2 for safety/equipment Sidelying to sit: Mod assist, +2 for safety/equipment, +2 for physical assistance   Sit to supine: +2 for safety/equipment, Mod assist   General bed mobility comments: pt rolled to L x 1 and to R x 2 with good assist from LEs, pt hugged pillow for comfort; assist to raise trunk; pt sat EOB x 4 minutes without external trunk support, sitting tolerance limited by pain    Transfers                        Ambulation/Gait                   Stairs             Wheelchair Mobility     Tilt Bed    Modified Rankin (Stroke Patients Only)       Balance   Sitting-balance support: Feet supported, Single extremity supported Sitting balance-Leahy Scale: Fair                                      Hotel Manager: No apparent difficulties  Cognition Arousal: Alert Behavior During Therapy: Lability   PT - Cognitive impairments: No apparent impairments                       PT - Cognition Comments: slowed speech Following commands: Impaired Following commands impaired: Follows one step commands with increased time    Cueing Cueing Techniques: Verbal cues, Gestural  cues  Exercises General Exercises - Lower Extremity Ankle Circles/Pumps: AROM, Both, 10 reps, Supine Short Arc Quad: AROM, Both, 10 reps, Supine Long Arc Quad: AROM, Both, 5 reps, Seated    General Comments        Pertinent Vitals/Pain Pain Assessment Pain Score: 8  Pain Location: abdomen Pain Descriptors / Indicators: Aching, Guarding, Grimacing, Discomfort, Operative site guarding Pain Intervention(s): Limited activity within patient's tolerance, Monitored during session, Premedicated before session, Repositioned, Patient requesting pain meds-RN notified    Home Living                          Prior Function             PT Goals (current goals can now be found in the care plan section) Acute Rehab PT Goals Patient Stated Goal: get stronger, return to independent PT Goal Formulation: With patient Time For Goal Achievement: 12/15/23 Potential to Achieve Goals: Fair Progress towards PT goals: Progressing toward goals    Frequency    Min 3X/week      PT Plan      Co-evaluation PT/OT/SLP Co-Evaluation/Treatment: Yes Reason for Co-Treatment: Complexity of the patient's impairments (multi-system involvement);For patient/therapist safety;To address functional/ADL transfers PT goals addressed during session: Mobility/safety with mobility;Balance;Strengthening/ROM        AM-PAC PT 6 Clicks Mobility   Outcome Measure  Help needed turning from your back to your side while in a flat bed without using bedrails?: A Lot Help needed moving from lying on your back to sitting on the side of a flat bed without using bedrails?: A Lot Help needed moving to and from a bed to a chair (including a wheelchair)?: Total Help needed standing up from a chair using your arms (e.g., wheelchair or bedside chair)?: Total Help needed to walk in hospital room?: Total Help needed climbing 3-5 steps with a railing? : Total 6 Click Score: 8    End of Session   Activity Tolerance: Patient limited by pain;Patient limited by fatigue Patient left: in bed;with bed alarm set;with call bell/phone within reach (bed in chair position)   PT Visit Diagnosis: Other abnormalities of gait and mobility (R26.89);Muscle weakness (generalized) (M62.81);Pain     Time: 1356-1441 PT Time Calculation (min) (ACUTE ONLY): 45 min  Charges:    $Therapeutic Exercise: 8-22 mins $Therapeutic Activity: 8-22 mins PT General Charges $$ ACUTE PT VISIT: 1 Visit                     Sylvan Delon Copp PT 12/04/2023  Acute Rehabilitation Services  Office [phone]

## 2023-12-05 DIAGNOSIS — E039 Hypothyroidism, unspecified: Secondary | ICD-10-CM | POA: Diagnosis not present

## 2023-12-05 LAB — COMPREHENSIVE METABOLIC PANEL WITH GFR
ALT: 31 U/L (ref 0–44)
AST: 43 U/L — ABNORMAL HIGH (ref 15–41)
Albumin: 2.1 g/dL — ABNORMAL LOW (ref 3.5–5.0)
Alkaline Phosphatase: 304 U/L — ABNORMAL HIGH (ref 38–126)
Anion gap: 5 (ref 5–15)
BUN: 8 mg/dL (ref 6–20)
CO2: 22 mmol/L (ref 22–32)
Calcium: 7.2 mg/dL — ABNORMAL LOW (ref 8.9–10.3)
Chloride: 107 mmol/L (ref 98–111)
Creatinine, Ser: 0.38 mg/dL — ABNORMAL LOW (ref 0.44–1.00)
GFR, Estimated: >60 mL/min (ref >60)
Glucose, Bld: 84 mg/dL (ref 70–99)
Potassium: 5.2 mmol/L — ABNORMAL HIGH (ref 3.5–5.1)
Sodium: 134 mmol/L — ABNORMAL LOW (ref 135–145)
Total Bilirubin: 0.2 mg/dL (ref 0.0–1.2)
Total Protein: 4.5 g/dL — ABNORMAL LOW (ref 6.5–8.1)

## 2023-12-05 LAB — GLUCOSE, CAPILLARY
Glucose-Capillary: 102 mg/dL — ABNORMAL HIGH (ref 70–99)
Glucose-Capillary: 71 mg/dL (ref 70–99)
Glucose-Capillary: 80 mg/dL (ref 70–99)
Glucose-Capillary: 83 mg/dL (ref 70–99)
Glucose-Capillary: 84 mg/dL (ref 70–99)
Glucose-Capillary: 87 mg/dL (ref 70–99)
Glucose-Capillary: 96 mg/dL (ref 70–99)

## 2023-12-05 LAB — CBC
HCT: 20.7 % — ABNORMAL LOW (ref 36.0–46.0)
Hemoglobin: 6.8 g/dL — CL (ref 12.0–15.0)
MCH: 31.2 pg (ref 26.0–34.0)
MCHC: 32.9 g/dL (ref 30.0–36.0)
MCV: 95 fL (ref 80.0–100.0)
Platelets: 154 K/uL (ref 150–400)
RBC: 2.18 MIL/uL — ABNORMAL LOW (ref 3.87–5.11)
RDW: 19 % — ABNORMAL HIGH (ref 11.5–15.5)
WBC: 10 K/uL (ref 4.0–10.5)
nRBC: 0.3 % — ABNORMAL HIGH (ref 0.0–0.2)

## 2023-12-05 LAB — MAGNESIUM: Magnesium: 1.7 mg/dL (ref 1.7–2.4)

## 2023-12-05 LAB — PHOSPHORUS: Phosphorus: 2.5 mg/dL (ref 2.5–4.6)

## 2023-12-05 LAB — PREPARE RBC (CROSSMATCH)

## 2023-12-05 MED ORDER — SODIUM PHOSPHATES 45 MMOLE/15ML IV SOLN
30.0000 mmol | Freq: Once | INTRAVENOUS | Status: AC
  Administered 2023-12-05: 30 mmol via INTRAVENOUS
  Filled 2023-12-05: qty 10

## 2023-12-05 MED ORDER — MAGNESIUM SULFATE 2 GM/50ML IV SOLN
2.0000 g | Freq: Once | INTRAVENOUS | Status: AC
  Administered 2023-12-05: 2 g via INTRAVENOUS
  Filled 2023-12-05: qty 50

## 2023-12-05 MED ORDER — SODIUM CHLORIDE 0.9% IV SOLUTION: Freq: Once | INTRAVENOUS | Status: AC

## 2023-12-05 MED ORDER — ALTEPLASE 2 MG IJ SOLR
2.0000 mg | Freq: Once | INTRAMUSCULAR | Status: AC
  Administered 2023-12-05: 2 mg
  Filled 2023-12-05: qty 2

## 2023-12-05 NOTE — Progress Notes (Signed)
 RN to bedside to instilled TPA into medial (blue) lumen. Only able to instill 1ml after 30+ minutes of attempting. Will allow to indwell x 2 hours and re-evaluate

## 2023-12-05 NOTE — Progress Notes (Signed)
 Progress Note  7 Days Post-Op  Subjective: Nursing staff present during encounter.  Patient denies worsening pain. Had pain medication prior this encounter. Tolerated carb modified diet. States she had 50% of her dinner and about 25% of her breakfast this morning. Denies nausea dn vomiting. Had BM yesterday none today yet. Reports flatulence.   ROS  All negative with the exception of above.  Objective: Vital signs in last 24 hours: Temp:  [98.3 F (36.8 C)-98.8 F (37.1 C)] 98.7 F (37.1 C) (11/11 0404) Pulse Rate:  [88-94] 90 (11/11 0404) Resp:  [17-18] 18 (11/11 0404) BP: (90-104)/(46-70) 96/61 (11/11 0521) SpO2:  [92 %-97 %] 97 % (11/11 0404) Weight:  [94.5 kg] 94.5 kg (11/11 0500) Last BM Date : 12/03/23  Intake/Output from previous day: 11/10 0701 - 11/11 0700 In: 5543.4 [P.O.:360; I.V.:4937.4; IV Piggyback:246] Out: 6000 [Urine:6000] Intake/Output this shift: No intake/output data recorded.  PE: General: Pleasant female who is laying in bed in NAD. HEENT: Head is normocephalic, atraumatic.   Heart: HR normal during encounter. Lungs: Respiratory effort nonlabored. Abd: Soft, NT, ND. Dressing of abdominal wound clean and dry. Nursing staff states that the dressing has not been changed today and will message me later when they are performing exchange. Will plan to exam simulataneously. No rebound tenderness or guarding.  Skin: Warm and dry.   Lab Results:  Recent Labs    12/04/23 0258  WBC 7.9  HGB 7.0*  HCT 21.9*  PLT 88*   BMET Recent Labs    12/04/23 1801 12/05/23 0401  NA 133* 134*  K 5.2* 5.2*  CL 106 107  CO2 23 22  GLUCOSE 84 84  BUN 9 8  CREATININE 0.36* 0.38*  CALCIUM 7.3* 7.2*   PT/INR No results for input(s): LABPROT, INR in the last 72 hours. CMP     Component Value Date/Time   NA 134 (L) 12/05/2023 0401   NA 137 04/22/2019 1634   K 5.2 (H) 12/05/2023 0401   CL 107 12/05/2023 0401   CO2 22 12/05/2023 0401   GLUCOSE 84  12/05/2023 0401   BUN 8 12/05/2023 0401   BUN 8 04/22/2019 1634   CREATININE 0.38 (L) 12/05/2023 0401   CREATININE 0.73 08/02/2016 1237   CALCIUM 7.2 (L) 12/05/2023 0401   CALCIUM INTEDT 11/25/2023 1529   PROT 4.5 (L) 12/05/2023 0401   PROT 5.4 (L) 04/22/2019 1634   ALBUMIN 2.1 (L) 12/05/2023 0401   ALBUMIN 3.0 (L) 04/22/2019 1634   AST 43 (H) 12/05/2023 0401   ALT 31 12/05/2023 0401   ALKPHOS 304 (H) 12/05/2023 0401   BILITOT 0.2 12/05/2023 0401   BILITOT 0.3 04/22/2019 1634   GFRNONAA >60 12/05/2023 0401   GFRAA 80 04/22/2019 1634   Lipase     Component Value Date/Time   LIPASE 82 (H) 03/08/2022 1940       Studies/Results: No results found.  Anti-infectives: Anti-infectives (From admission, onward)    Start     Dose/Rate Route Frequency Ordered Stop   11/26/23 2300  cefTRIAXone  (ROCEPHIN ) 2 g in sodium chloride  0.9 % 100 mL IVPB        2 g 200 mL/hr over 30 Minutes Intravenous Every 24 hours 11/26/23 2210 12/01/23 0028   11/26/23 2300  metroNIDAZOLE  (FLAGYL ) IVPB 500 mg        500 mg 100 mL/hr over 60 Minutes Intravenous Every 12 hours 11/26/23 2210 11/30/23 2347        Assessment/Plan POD 7: LAPAROTOMY, EXPLORATORY (N/A) -  Exploratory Laparotomy, Possible Bowel Resection EXCISION, SMALL INTESTINE WITH ANASTOMOSIS SECONDARY CLOSURE OF WOUND AND J TUBE CREATION with Dr. Stevie 11/4. -Vitals stable. -CBC from 11/10: WBC 7.9 and HGB 7.0 f 7.9; Continue to monitor HGB. CBC planned for this afternoon. -Tolerated Carb modified diet. -Will continue to follow.   FEN: Carb modified; Advancement of j tube feeds q6h to goal was 11/9. VTE: None currently ID: None.    LOS: 10 days   I reviewed nursing notes, specialist notes, hospitalist notes, last 24 h vitals and pain scores, last 48 h intake and output, last 24 h labs and trends, and last 24 h imaging results.   Renee [name], [facility] Surgery 12/05/2023, 10:06 AM Please see Amion for  pager number during day hours 7:00am-4:30pm

## 2023-12-05 NOTE — Plan of Care (Signed)
   Problem: Education: Goal: Knowledge of General Education information will improve Description Including pain rating scale, medication(s)/side effects and non-pharmacologic comfort measures Outcome: Progressing

## 2023-12-05 NOTE — Progress Notes (Signed)
 Inpatient Rehab Admissions Coordinator:    Pt. Continues to be unable to tolerate OOB with therapies. Right now, I do not think she can tolerate the intensity of CIR. I can continue to follow an monitor for progress and participation but if she is nearing ready for d/c TOC will need to seek other venues.   Leita Kleine, MS, CCC-SLP Rehab Admissions Coordinator  [phone] (celll) [phone] (office)

## 2023-12-05 NOTE — Progress Notes (Addendum)
 IV Team unable to declot medial (blue) lumen of pt's CVC.  MD and primary RN aware. Plan will be to continue the current line in anticipation of being able to remove it in the next day or two per MD.  Teresa and brown lumens flush well. Please reach out to IV team if we can assist in anything further.

## 2023-12-05 NOTE — Progress Notes (Signed)
 Attempted to remove TPA from blue lumen unsuccessfully.MD notified to advise on next steps. Care RN at bedside during procedure.

## 2023-12-05 NOTE — Plan of Care (Signed)
  Problem: Education: Goal: Knowledge of General Education information will improve Description: Including pain rating scale, medication(s)/side effects and non-pharmacologic comfort measures Outcome: Progressing   Problem: Health Behavior/Discharge Planning: Goal: Ability to manage health-related needs will improve Outcome: Progressing   Problem: Clinical Measurements: Goal: Will remain free from infection Outcome: Progressing Goal: Diagnostic test results will improve Outcome: Progressing   Problem: Nutrition: Goal: Adequate nutrition will be maintained Outcome: Progressing   Problem: Elimination: Goal: Will not experience complications related to bowel motility Outcome: Progressing   Problem: Pain Managment: Goal: General experience of comfort will improve and/or be controlled Outcome: Progressing   Problem: Safety: Goal: Ability to remain free from injury will improve Outcome: Progressing   Problem: Skin Integrity: Goal: Risk for impaired skin integrity will decrease Outcome: Progressing

## 2023-12-05 NOTE — Plan of Care (Signed)
  Problem: Education: Goal: Knowledge of General Education information will improve Description: Including pain rating scale, medication(s)/side effects and non-pharmacologic comfort measures Outcome: Progressing   Problem: Coping: Goal: Level of anxiety will decrease Outcome: Progressing   Problem: Pain Managment: Goal: General experience of comfort will improve and/or be controlled Outcome: Progressing

## 2023-12-05 NOTE — TOC Progression Note (Signed)
 Transition of Care [facility]) - Progression Note    Patient Details  Name: Renee [name] MRN: [id number] Date of Birth: 12-20-1975  Transition of [facility] CM/SW Contact  Renee [name] Saltness, LCSW Phone Number: 12/05/2023, 4:08 PM  Clinical Narrative:    Per CIR admissions coordinator, Leita Kleine, pt currently unable to tolerate intensity of CIR. CIR will continue to follow. Pt will need new PT evaluation recommending SNF, if that's an option as opposed to CIR. TOC will continue to follow.     Barriers to Discharge: Continued Medical Work up, Other (must enter comment) (Having procedure today/ On the ventilator)   Expected Discharge Plan and Services In-house Referral: NA Discharge Planning Services: CM Consult Post Acute Care Choice: NA Living arrangements for the past 2 months: Single Family Home                 DME Arranged: N/A DME Agency: NA       HH Arranged: NA HH Agency: NA         Social Drivers of Health (SDOH) Interventions SDOH Screenings   Food Insecurity: Food Insecurity Present (11/25/2023)  Housing: High Risk (11/25/2023)  Transportation Needs: No Transportation Needs (11/25/2023)  Utilities: At Risk (11/25/2023)  Depression (PHQ2-9): Low Risk  (08/21/2020)  Financial Resource Strain: High Risk (11/03/2022)   Received from [facility]  Tobacco Use: Medium Risk (11/25/2023)    Readmission Risk Interventions    11/28/2023    9:46 AM  Readmission Risk Prevention Plan  Transportation Screening Complete  PCP or Specialist Appt within 5-7 Days Complete  Home Care Screening Complete  Medication Review (RN CM) Complete    Signed: [name], MSW, LCSW Clinical Social Worker Inpatient Care Management 12/05/2023 4:11 PM

## 2023-12-05 NOTE — Progress Notes (Signed)
 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  Admitted for multiple electrolyte abnormalities including hypocalcemia, hypokalemia, hypomagnesemia, prolonged QTc.  Developed sudden abdominal pain early in hospitalization underwent emergent exploratory laparotomy with placement of wound VAC and closure of internal hernia defect.  Postoperatively was sedated and continued on ventilator, subsequently return to the OR for small bowel resection, jejunostomy tube insertion and closure of abdomen.  Required multiple vasopressors for shock.  Eventually weaned off vasopressors and extubated.  Transferred to hospitalist service 11/7.  Consultants General surgery   Procedures/Events 11/1 admission for multiple electrolyte abnormalities 11/2 sudden abdominal pain, CT showed pneumatosis, underwent emergent diagnostic laparoscopy converted to exploratory laparotomy, placement of AbThera VAC (non-disposable DME), closure of internal hernia defect  11/3 CVC insertion 11/4 small bowel resection, jejunostomy tube insertion, closure of abdomen  Assessment and Plan: Mesenteric ischemia Pseudo-Petersen's internal hernia PMH bariatric surgery Distributive shock Metabolic acidosis S/p PJT Stabilized.  Shock resolved.  Status post surgeries as above. Management as per general surgery.  Diet advanced.  Oral intake improving.   Anemia Thrombocytopenia, resolved Multifactorial anemia, nutritional, perioperative, ABLA.   Hemoglobin 6.8 this morning, will transfuse.  No known bleeding.   Hypocalcemia Hypokalemia Hypomagnesemia Hypophosphatemia Micronutrient deficiencies including copper , vitamin E, zinc  Secondary to bariatric surgery. Potassium slightly high but  will monitor.  Although within normal limits, phosphorus and magnesium  have decreased, will replete.  Continue to follow.  Continue to replete calcium.   Severe hypothyroidism TSH 147 on admission.  T3 low.  T4 very low.  Noncompliant with levothyroxine .  Secondary to inability to pay for it. Check TSH first week in December   Moderate malnutrition Overweight Per dietician Body mass index is 29.69 kg/m.   PTSD Anxiety Bipolar disorder ADD Fibromyalgia   Complex social issues   Adrenal insufficiency? Unclear if she ever followed up for this, apparently steroids were considered on her previous [facility] admission, may need outpatient endocrinology evaluation No hypotension.  Follow-up as an outpatient.      Subjective:  Feels ok now Eating better  Physical Exam: Vitals:   12/05/23 1456 12/05/23 1459 12/05/23 1514 12/05/23 1514  BP: 95/69 95/69 96/68  96/68  Pulse: 95 95 88 88  Resp: 16 16 16 16   Temp: 98.4 F (36.9 C) 98.4 F (36.9 C) 98.6 F (37 C) 98.6 F (37 C)  TempSrc: Oral Oral  Oral  SpO2: 100%   99%  Weight:      Height:       Physical Exam Vitals reviewed.  Constitutional:      General: She is not in acute distress.    Appearance: She is not ill-appearing or toxic-appearing.  Cardiovascular:     Rate and Rhythm: Normal rate and regular rhythm.     Heart sounds: No murmur heard. Pulmonary:     Effort: Pulmonary effort is normal. No respiratory distress.     Breath sounds: No wheezing, rhonchi or rales.  Neurological:     Mental Status: She is alert.  Psychiatric:        Mood and Affect: Mood normal.        Behavior: Behavior normal.     Data Reviewed: CBG stable Potassium 5.2 Creatinine within normal limits Phosphorus 2.5, magnesium  1.7 Hemoglobin down to 6.8, platelets have recovered to  within normal limits.  Family Communication: none  Disposition: Status is: Inpatient Remains inpatient appropriate because: Anemia, electrolyte  abnormalities     Time spent: 20 minutes  Author: Toribio Door, MD 12/05/2023 4:20 PM  For on call review [email or web address].

## 2023-12-05 NOTE — Progress Notes (Signed)
 Nutrition Follow-up  DOCUMENTATION CODES:   Non-severe (moderate) malnutrition in context of chronic illness  INTERVENTION:  - Continue goal TF regimen: Vital 1.5 at 50 ml/h (1200 ml per day) Prosource TF20 60 ml daily Provides 1880 kcal, 101 gm protein, 917 ml free water daily   - Monitor magnesium , potassium, and phosphorus daily for at least 3 days, MD to replete as needed.   - Carb Modified diet per MD. - Ensure Max po daily, provides 150 kcal and 30 grams of protein.    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  - Vitamin B12: WNL - Thiamine : WNL - Vitamin K WNL - Iron: WNL - CRP: 3.4 (HIGH)   - Starting supplementation today as below: - Vitamin D : < 4.2 (LOW) -  started 50,000 units weekly x8 weeks then re-check - Vitamin A : 2.6 (LOW)  started 100,000 international units daily x3 days followed by 50,000 international units daily x14 days followed by 10,000 international units daily x14 days then re-check - Started multivitamin with minerals daily.   - Will plan to start further supplementation as below on 11/14. - Vitamin E: 3.0 (LOW) - recommend 400 units daily x30 days then re-check  - Zinc : 20 (LOW) - recommend 220mg  daily x14 days then re-check - Copper : 29 (LOW) - recommend 8mg  copper  daily x1 week followed by 6mg  copper  daily x1 week followed by 4mg  copper  daily x1 week then re-check  - Selenium: 52 (LOW) - recommend 100mcg daily x30 days then re-check  national units daily x14 days then re-check  - Vitamin C: 0.3 (LOW) - recommend 500mg  BID x2 days followed by 500mg  daily x1 week then re-check   NUTRITION DIAGNOSIS:   Moderate Malnutrition related to chronic illness (gastric bypass later converted to a duodenal switch) as evidenced by moderate fat depletion, moderate muscle depletion. *ongoing  GOAL:   Patient will meet greater than or  equal to 90% of their needs *met with TF, oral diet  MONITOR:   Vent status, Labs, Weight trends  REASON FOR ASSESSMENT:   Consult Assessment of nutrition requirement/status (severe malnutrition; history of gastric bypass convered to duodenal switch and has not been taking vitamins)  ASSESSMENT:   48 y.o. 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 Admitted for low electrolytes and low calcium.  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 11/5 Trickle TF (1mL/hr) initiated via J-tube; Patient self extubated overnight 11/6 CLD 11/7 Bariatric FLD 11/10 Carb Modified diet   Met with patient at bedside today. She reports she had some of a deli sandwich last night and this is the first solid food meal she has had. Endorses tolerating well. Had some eggs, sausage, toast, and juice this AM. She plans to take intake slow and see how she does.   Reports no intolerance issues to tube feeds. Reminded patient she is getting 100% of her nutritional needs via TF at this time but would work on slowly increasing oral intake as tolerated.  Reviewed vitamin/mineral deficiencies found this admission with patient. Discussed plan for  repletion as detailed above. Patient endorsed understanding and agreeable to plan.     Admit weight: 155# -> 142# Current weight: 208# (suspect elevated d/t fluid) I&O's: +25.9L since admit + for non-pitting generalized edema   Medications reviewed and include: 1mg  folic acid , 100mg  thiamine , MVI, 50,000 units vitamin D  weekly, 100,000  units vitamin A  x3 days, Protonix    Labs reviewed:  Na 134 K+ 5.2 Phosphorus 1.9 Magnesium  1.6   Vitamins/Minerals: Vitamin B12: 570 (WNL) Thiamine : WNL Vitamin K: WNL Iron: WNL CRP: 3.4 (HIGH) Vitamin D : < 4.2 (LOW)  Vitamin E: 3.0 (LOW)  Zinc : 20 (LOW)  Copper : 29 (LOW)  Selenium: 52 (LOW)  Vitamin A : 2.6 (LOW) Vitamin C: 0.3 (LOW)  Diet Order:   Diet Order             Diet Carb Modified Fluid consistency: Thin; Room service appropriate? Yes  Diet effective now                   EDUCATION NEEDS:  Not appropriate for education at this time  Skin:  Skin Assessment: Skin Integrity Issues: Skin Integrity Issues:: Incisions Incisions: Abdomen  Last BM:  11/11  Height:  Ht Readings from Last 1 Encounters:  11/28/23 5' 7 (1.702 m)   Weight:  Wt Readings from Last 1 Encounters:  12/05/23 94.5 kg   Ideal Body Weight:  61.36 kg  BMI:  Body mass index is 32.63 kg/m.  Estimated Nutritional Needs:  Kcal:  1800-2100 kcals Protein:  90-115 grams Fluid:  >/= 1.8L    [name] RD, LDN Contact via Secure Chat.

## 2023-12-06 LAB — BASIC METABOLIC PANEL WITH GFR
Anion gap: 7 (ref 5–15)
Anion gap: 5 (ref 5–15)
BUN: 9 mg/dL (ref 6–20)
BUN: 8 mg/dL (ref 6–20)
CO2: 24 mmol/L (ref 22–32)
CO2: 24 mmol/L (ref 22–32)
Calcium: 6.6 mg/dL — ABNORMAL LOW (ref 8.9–10.3)
Calcium: 6.9 mg/dL — ABNORMAL LOW (ref 8.9–10.3)
Chloride: 103 mmol/L (ref 98–111)
Chloride: 105 mmol/L (ref 98–111)
Creatinine, Ser: 0.37 mg/dL — ABNORMAL LOW (ref 0.44–1.00)
Creatinine, Ser: 0.36 mg/dL — ABNORMAL LOW (ref 0.44–1.00)
GFR, Estimated: >60 mL/min (ref >60)
GFR, Estimated: >60 mL/min (ref >60)
Glucose, Bld: 49 mg/dL — ABNORMAL LOW (ref 70–99)
Glucose, Bld: 78 mg/dL (ref 70–99)
Potassium: 4.7 mmol/L (ref 3.5–5.1)
Potassium: 4.8 mmol/L (ref 3.5–5.1)
Sodium: 134 mmol/L — ABNORMAL LOW (ref 135–145)
Sodium: 133 mmol/L — ABNORMAL LOW (ref 135–145)

## 2023-12-06 LAB — HEPATIC FUNCTION PANEL
ALT: 25 U/L (ref 0–44)
AST: 31 U/L (ref 15–41)
Albumin: 2.1 g/dL — ABNORMAL LOW (ref 3.5–5.0)
Alkaline Phosphatase: 225 U/L — ABNORMAL HIGH (ref 38–126)
Bilirubin, Direct: 0.1 mg/dL (ref 0.0–0.2)
Indirect Bilirubin: 0.2 mg/dL — ABNORMAL LOW (ref 0.3–0.9)
Total Bilirubin: 0.3 mg/dL (ref 0.0–1.2)
Total Protein: 4.7 g/dL — ABNORMAL LOW (ref 6.5–8.1)

## 2023-12-06 LAB — GLUCOSE, CAPILLARY
Glucose-Capillary: 91 mg/dL (ref 70–99)
Glucose-Capillary: 76 mg/dL (ref 70–99)
Glucose-Capillary: 77 mg/dL (ref 70–99)
Glucose-Capillary: 89 mg/dL (ref 70–99)
Glucose-Capillary: 83 mg/dL (ref 70–99)

## 2023-12-06 LAB — TYPE AND SCREEN
ABO/RH(D): A POS
Antibody Screen: NEGATIVE
Unit division: 0

## 2023-12-06 LAB — CBC
HCT: 27.2 % — ABNORMAL LOW (ref 36.0–46.0)
Hemoglobin: 8.7 g/dL — ABNORMAL LOW (ref 12.0–15.0)
MCH: 31.2 pg (ref 26.0–34.0)
MCHC: 32 g/dL (ref 30.0–36.0)
MCV: 97.5 fL (ref 80.0–100.0)
Platelets: 157 K/uL (ref 150–400)
RBC: 2.79 MIL/uL — ABNORMAL LOW (ref 3.87–5.11)
RDW: 19.3 % — ABNORMAL HIGH (ref 11.5–15.5)
WBC: 10.1 K/uL (ref 4.0–10.5)
nRBC: 0.5 % — ABNORMAL HIGH (ref 0.0–0.2)

## 2023-12-06 LAB — MAGNESIUM: Magnesium: 1.7 mg/dL (ref 1.7–2.4)

## 2023-12-06 LAB — BPAM RBC
Blood Product Expiration Date: 202512042359
ISSUE DATE / TIME: 202511111451
Unit Type and Rh: 6200

## 2023-12-06 MED ORDER — OXYCODONE HCL 5 MG PO TABS
5.0000 mg | ORAL_TABLET | Freq: Four times a day (QID) | ORAL | Status: DC | PRN
  Administered 2023-12-06: 10 mg via ORAL
  Administered 2023-12-06 – 2023-12-07 (×2): 5 mg via ORAL
  Administered 2023-12-07 – 2023-12-08 (×4): 10 mg via ORAL
  Administered 2023-12-09 (×2): 5 mg via ORAL
  Administered 2023-12-11: 10 mg via ORAL
  Filled 2023-12-06 (×3): qty 2
  Filled 2023-12-06 (×3): qty 1
  Filled 2023-12-06 (×3): qty 2
  Filled 2023-12-06: qty 1

## 2023-12-06 MED ORDER — HYDROXYZINE HCL 25 MG PO TABS
25.0000 mg | ORAL_TABLET | Freq: Four times a day (QID) | ORAL | Status: DC | PRN
Start: 2023-12-06 — End: 2023-12-11
  Administered 2023-12-06 – 2023-12-10 (×6): 25 mg via ORAL
  Filled 2023-12-06 (×7): qty 1

## 2023-12-06 MED ORDER — MAGNESIUM SULFATE 2 GM/50ML IV SOLN
2.0000 g | Freq: Once | INTRAVENOUS | Status: AC
  Administered 2023-12-06: 2 g via INTRAVENOUS
  Filled 2023-12-06: qty 50

## 2023-12-06 MED ORDER — CALCIUM CITRATE 950 (200 CA) MG PO TABS
200.0000 mg | ORAL_TABLET | Freq: Three times a day (TID) | ORAL | Status: DC
  Administered 2023-12-07 – 2023-12-11 (×11): 950 mg via ORAL
  Filled 2023-12-06 (×14): qty 1

## 2023-12-06 NOTE — Progress Notes (Signed)
   12/06/23 2200  Spiritual Encounters  Type of Visit Initial  Care provided to: Patient  Referral source Clinical staff  Reason for visit Urgent spiritual support  OnCall Visit Yes  Spiritual Framework  Presenting Themes Meaning/purpose/sources of inspiration;Impactful experiences and emotions;Coping tools;Courage hope and growth;Rituals and practive;Community and relationships  Patient Stress Factors Family relationships;Financial concerns;Health changes;Lack of caregivers;Lack of knowledge;Loss;Loss of control;Major life changes  Family Stress Factors None identified   Chalain met with patient at bedside thi  evening.  She spoke at leantgth regarding her health hisotry and emoional life stressors.  Provided spiritual care and counsel,. Recommended a social work evaluation for [facility] as well as a Psychiatric Evaluation is possibly needed  No spiritual distress exhibited - but stressors that need to be addressed at this time for a safe discharge.

## 2023-12-06 NOTE — Progress Notes (Signed)
 Inpatient Rehab Admissions Coordinator:    CIR following but Pt. Currently has not attempted transfers or demonstrated the ability to tolerate a 3 hour program. If she is stable for d/c at this time, SNF would be a more appropriate venue.   Leita Kleine, MS, CCC-SLP Rehab Admissions Coordinator  [phone] (celll) [phone] (office)

## 2023-12-06 NOTE — Plan of Care (Signed)
   Problem: Education: Goal: Knowledge of General Education information will improve Description Including pain rating scale, medication(s)/side effects and non-pharmacologic comfort measures Outcome: Progressing   Problem: Health Behavior/Discharge Planning: Goal: Ability to manage health-related needs will improve Outcome: Progressing
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
## 2023-12-06 NOTE — PMR Pre-admission (Incomplete)
 PMR Admission Coordinator Pre-Admission Assessment  Patient: Renee [name] is an 48 y.o., female MRN: [id number] DOB: Jul 05, 1975 Height: 5' 7 (170.2 cm) Weight: 93.6 kg  Insurance Information HMO: ***    PPO: ***     PCP: ***     IPA: ***     80/20: ***     OTHER: *** PRIMARY: ***      Policy#: ***      Subscriber: *** CM Name: ***      Phone#: ***     Fax#: *** Pre-Cert#: ***      Employer: *** Benefits:  Phone #: ***     Name: *** Eff. Date: ***     Deduct: ***      Out of Pocket Max: ***      Life Max: *** CIR: ***      SNF: *** Outpatient: ***     Co-Pay: *** Home Health: ***      Co-Pay: *** DME: ***     Co-Pay: *** Providers: *** SECONDARY: ***      Policy#: ***     Phone#: ***  Financial Counselor: ***      Phone#: ***  The "Data Collection Information Summary" for patients in Inpatient Rehabilitation Facilities with attached "Privacy Act Statement-Health Care Records" was provided and verbally reviewed with: {CHL IP Patient Family WJ:695449998}  Emergency Contact Information Contact Information     Name Relation Home Work [place] Mother [phone]  [phone]      Other Contacts     Name Relation Home Work Mobile   Marksberry,Bob Father   [phone]   Zahlia, Deshazer   [phone]       Current Medical History  Patient Admitting Diagnosis: *** History of Present Illness: ***    Patient's medical record from *** has been reviewed by the rehabilitation admission coordinator and physician.  Past Medical History  Past Medical History:  Diagnosis Date   ADD (attention deficit disorder with hyperactivity)    Allergy    Anemia    Anxiety and depression    followed by Dr. Vincente and Dwayne at [facility]   Bipolar disorder [facility])    sees Dr. Emilio Vincente   Chronic nausea    Chronic pain    DDD (degenerative disc disease), cervical 02/24/2015   C4 foraminal narrowing-chronic pain    Dermatitis    Edema    Fibromyalgia     GERD (gastroesophageal reflux disease)    Hearing difficulty    Herpes simplex    Hypertension    Hypothyroidism    Migraines    OSA (obstructive sleep apnea) 01/26/2018   HST 02/08/18 AHI 1.0 is not diagnostic of obstructive sleep apnea   Polyarthralgia    Sepsis secondary to UTI (HCC) 03/09/2022   Skin abnormalities    sees [facility]   Urinary incontinence     Has the patient had major surgery during 100 days prior to admission? {Yes/No/Unknown:304600602}  Family History   family history includes Alcohol  abuse in her brother, paternal grandfather, and paternal grandmother; Anxiety disorder in her father and mother; Arthritis in her maternal grandfather; Bipolar disorder in her maternal grandmother; Cancer in her maternal grandmother and mother; Diabetes in her father and paternal grandmother; Drug abuse in her brother; Heart disease in her father; Neuropathy in her mother.  Current Medications  Current Facility-Administered Medications:    acetaminophen  (TYLENOL ) tablet 650 mg, 650 mg, Per Tube, Q6H PRN, Jadine Toribio SQUIBB, MD   calcium  citrate (CALCITRATE - dosed in mg elemental calcium) tablet 950 mg, 200 mg of elemental calcium, Per Tube, TID WC, Jadine Toribio SQUIBB, MD, 950 mg at 12/06/23 1126   calcium gluconate 930 mg of elemental calcium in sodium chloride  0.9 % 1,000 mL infusion, 0.5 mg/kg/hr of elemental calcium (Adjusted), Intravenous, Continuous, Jadine Toribio SQUIBB, MD, Last Rate: 38.4 mL/hr at 12/06/23 0243, 0.5 mg/kg/hr of elemental calcium at 12/06/23 0243   dextrose  10 % infusion, , Intravenous, Continuous, Paliwal, Aditya, MD, Last Rate: 30 mL/hr at 12/05/23 1000, New Bag at 12/05/23 1000   feeding supplement (PROSource TF20) liquid 60 mL, 60 mL, Per Tube, Daily, Bowser, Grace E, NP, 60 mL at 12/06/23 0953   feeding supplement (VITAL 1.5 CAL) liquid 1,000 mL, 1,000 mL, Per J Tube, Q24H, Kinsinger, Herlene Righter, MD, 1,000 mL at 12/06/23 0949   fentaNYL  (SUBLIMAZE )  injection 25-50 mcg, 25-50 mcg, Intravenous, Q2H PRN, Jadine Toribio SQUIBB, MD, 50 mcg at 12/06/23 1002   folic acid  (FOLVITE ) tablet 1 mg, 1 mg, Oral, Daily, Casimir Camelia RAMAN, RPH, 1 mg at 12/06/23 9044   furosemide  (LASIX ) tablet 20 mg, 20 mg, Oral, Daily, Jadine Toribio SQUIBB, MD, 20 mg at 12/06/23 0955   influenza vac split trivalent PF (FLUZONE) injection 0.5 mL, 0.5 mL, Intramuscular, Prior to discharge, Seena Marsa NOVAK, MD   levothyroxine  (SYNTHROID ) tablet 200 mcg, 200 mcg, Oral, Q0600, Casimir Camelia RAMAN, RPH, 200 mcg at 12/06/23 0541   melatonin tablet 3 mg, 3 mg, Oral, QHS, Jadine Toribio SQUIBB, MD, 3 mg at 12/05/23 2105   multivitamin with minerals tablet 1 tablet, 1 tablet, Oral, Daily, Jadine Toribio SQUIBB, MD, 1 tablet at 12/05/23 1728   naloxone (NARCAN) injection 0.2-0.4 mg, 0.2-0.4 mg, Intravenous, PRN, Bowser, Grace E, NP, 0.4 mg at 11/29/23 1255   ondansetron  (ZOFRAN ) injection 4 mg, 4 mg, Intravenous, Q6H PRN, Jadine Toribio SQUIBB, MD, 4 mg at 12/06/23 9162   Oral care mouth rinse, 15 mL, Mouth Rinse, 4 times per day, Olalere, Adewale A, MD, 15 mL at 12/05/23 2106   Oral care mouth rinse, 15 mL, Mouth Rinse, PRN, Olalere, Adewale A, MD   oxyCODONE  (Oxy IR/ROXICODONE ) immediate release tablet 5-10 mg, 5-10 mg, Per Tube, Q6H PRN, Jadine Toribio SQUIBB, MD, 10 mg at 12/06/23 0755   pantoprazole  (PROTONIX ) EC tablet 40 mg, 40 mg, Oral, Daily, Casimir Camelia RAMAN, RPH, 40 mg at 12/06/23 9044   pneumococcal 20-valent conjugate vaccine (PREVNAR 20) injection 0.5 mL, 0.5 mL, Intramuscular, Prior to discharge, Simaan, Elizabeth S, PA-C   protein supplement (ENSURE MAX) liquid, 11 oz, Oral, Daily, Jadine Toribio SQUIBB, MD, 11 oz at 12/06/23 0957   sodium chloride  flush (NS) 0.9 % injection 3 mL, 3 mL, Intravenous, Q12H, Simaan, Elizabeth S, PA-C, 3 mL at 12/06/23 9041   vitamin A  capsule 100,000 Units, 100,000 Units, Oral, Daily, 100,000 Units at 12/06/23 0954 **FOLLOWED BY** [START ON  12/08/2023] vitamin A  capsule 50,000 Units, 50,000 Units, Oral, Daily **FOLLOWED BY** [START ON 12/22/2023] vitamin A  capsule 10,000 Units, 10,000 Units, Oral, Daily, Jadine Toribio SQUIBB, MD   Vitamin D  (Ergocalciferol ) (DRISDOL ) 1.25 MG (50000 UNIT) capsule 50,000 Units, 50,000 Units, Oral, Q7 days, Jadine Toribio SQUIBB, MD, 50,000 Units at 12/04/23 1647  Patients Current Diet:  Diet Order             Diet Carb Modified Fluid consistency: Thin; Room service appropriate? Yes  Diet effective now  Precautions / Restrictions Precautions Precautions: Fall Precaution/Restrictions Comments: J-tube, abdominal incision Restrictions Weight Bearing Restrictions Per Provider Order: No   Has the patient had 2 or more falls or a fall with injury in the past year? {Yes/No/Unknown:304600602}  Prior Activity Level    Prior Functional Level Self Care: Did the patient need help bathing, dressing, using the toilet or eating? {Prior Functional Ozczo:695399399}  Indoor Mobility: Did the patient need assistance with walking from room to room (with or without device)? {Prior Functional Ozczo:695399399}  Stairs: Did the patient need assistance with internal or external stairs (with or without device)? {Prior Functional Ozczo:695399399}  Functional Cognition: Did the patient need help planning regular tasks such as shopping or remembering to take medications? {Prior Functional Ozczo:695399399}  Patient Information    Patient's Response To:     Home Assistive Devices / Equipment Home Equipment: BSC/3in1, Hospital bed, [place] - single point  Prior Device Use: Indicate devices/aids used by the patient prior to current illness, exacerbation or injury? {Prior Device Ldzi:695399398}  Current Functional Level Cognition  Orientation Level: Oriented X4    Extremity Assessment (includes Sensation/Coordination)  Upper Extremity Assessment: Generalized weakness, Right hand dominant RUE  Coordination: decreased fine motor, decreased gross motor LUE Coordination: decreased fine motor, decreased gross motor  Lower Extremity Assessment: Defer to PT evaluation RLE Deficits / Details: AROM WFL, strength 3/5 or greater except painful with hip flexion due to abdominal incisions LLE Deficits / Details: AROM WFL, strength 3/5 or greater except painful with hip flexion due to abdominal incisions    ADLs  Overall ADL's : Needs assistance/impaired Eating/Feeding: Set up, Sitting (spoon and cup to mouth EOB) Grooming: Wash/dry hands, Wash/dry face, Oral care, Minimal assistance, Bed level Grooming Details (indicate cue type and reason): EOB for simple tasks Upper Body Bathing: Moderate assistance, Sitting Lower Body Bathing: Total assistance, Bed level Upper Body Dressing : Maximal assistance, Moderate assistance, Sitting Upper Body Dressing Details (indicate cue type and reason): EOB Lower Body Dressing: Total assistance, Bed level Toilet Transfer:  (deferred this session due to pain) Toileting- Clothing Manipulation and Hygiene: Maximal assistance, Bed level Toileting - Clothing Manipulation Details (indicate cue type and reason): voiding bed level Functional mobility during ADLs: Moderate assistance, +2 for physical assistance, Cueing for sequencing (use of rails and bed features for EOB sitting and scooting laterally) General ADL Comments: pain and activity tolerance limited session to EOB activity but significant increase in overall tolerance from IE    Mobility  Overal bed mobility: Needs Assistance Bed Mobility: Rolling, Sidelying to Sit, Sit to Supine Rolling: Mod assist, +2 for safety/equipment Sidelying to sit: Mod assist, +2 for safety/equipment, +2 for physical assistance Sit to supine: +2 for safety/equipment, Mod assist General bed mobility comments: pt rolled to L x 1 and to R x 2 with good assist from LEs, pt hugged pillow for comfort; assist to raise trunk; pt sat EOB  x 4 minutes without external trunk support, sitting tolerance limited by pain    Transfers  General transfer comment: declined today due to abdominal pain and fatigue    Ambulation / Gait / Stairs / Engineer, Drilling / Balance Dynamic Sitting Balance Sitting balance - Comments: UE support needed or A for balance while at EOB Balance Overall balance assessment: Needs assistance (EOB ADL's then fatigue and pain limited mobility progression) Sitting-balance support: Feet supported, Single extremity supported Sitting balance-Leahy Scale: Fair Sitting balance - Comments: UE support needed or A for  balance while at EOB    Special considerations/life events  {Special Care Needs/Care Considerations:304600603}   Previous Home Environment (from acute therapy documentation) Living Arrangements: Spouse/significant other Available Help at Discharge: Family Type of Home: House Home Layout: One level Home Access: Stairs to enter Entrance Stairs-Rails: None Entrance Stairs-Number of Steps: 3 Bathroom Shower/Tub: Engineer, Manufacturing Systems: Standard Home Care Services: No Additional Comments: thinks the [facility] was placed in the attic  Discharge Living Setting Does the patient have any problems obtaining your medications?: Yes (Describe) (see CSW consult note)  Social/Family/Support Systems    Goals    Decrease burden of Care through IP rehab admission: {Inpatient Rehab Care:20780}  Possible need for SNF placement upon discharge: ***  Patient Condition: {PATIENT'S CONDITION:22832}  Preadmission Screen Completed By:  Leita KATHEE Kleine, 12/06/2023 12:50 PM ______________________________________________________________________   Discussed status with Dr. PIERRETTE on *** at *** and received approval for admission today.  Admission Coordinator:  Leita KATHEE Kleine, CCC-SLP, time PIERRETTEPattricia ***   Assessment/Plan: Diagnosis: *** Does the need for close, 24 hr/day Medical supervision  in concert with the patient's rehab needs make it unreasonable for this patient to be served in a less intensive setting? {yes_no_potentially:3041433} Co-Morbidities requiring supervision/potential complications: *** Due to {due un:6958565}, does the patient require 24 hr/day rehab nursing? {yes_no_potentially:3041433} Does the patient require coordinated care of a physician, rehab nurse, PT, OT, and SLP to address physical and functional deficits in the context of the above medical diagnosis(es)? {yes_no_potentially:3041433} Addressing deficits in the following areas: {deficits:3041436} Can the patient actively participate in an intensive therapy program of at least 3 hrs of therapy 5 days a week? {yes_no_potentially:3041433} The potential for patient to make measurable gains while on inpatient rehab is {potential:3041437} Anticipated functional outcomes upon discharge from inpatient rehab: {functional outcomes:304600100} PT, {functional outcomes:304600100} OT, {functional outcomes:304600100} SLP Estimated rehab length of stay to reach the above functional goals is: *** Anticipated discharge destination: {anticipated dc setting:21604} 10. Overall Rehab/Functional Prognosis: {potential:3041437}   MD Signature: ***

## 2023-12-06 NOTE — Progress Notes (Signed)
 Inpatient Rehab Admissions Coordinator:    Note progress with PT. If she does well with OT tomorrow, we can open case with insurance and pursue for admit.   Leita Kleine, MS, CCC-SLP Rehab Admissions Coordinator  [phone] (celll) [phone] (office)

## 2023-12-06 NOTE — Progress Notes (Signed)
 CVC Removal Note: CVC line removed from right internal jugular after sterile site prepped per protocol. CVC catheter tip visualized and intact. Pressure held. Pressure dressing applied. No redness, ecchymosis, edema, swelling, or drainage noted at site. Instructions provided on post CVC discharge care, including followup notification instructions.  Bedrest until 1120.

## 2023-12-06 NOTE — TOC PASRR Note (Signed)
 [facility] IP TOC PASRR NOTE  30 Day PASRR Note   Patient Details  Name: Renee [name] Date of Birth: 11-24-1975   Transition of Care [facility]) CM/SW Contact:    Heather [name] Saltness, LCSW Phone Number: 12/06/2023, 12:57 PM  To Whom It May Concern:  Please be advised that this patient will require a short-term nursing home stay - anticipated 30 days or less for rehabilitation and strengthening.   The plan is for return home.   Signed: Heather Saltness, MSW, LCSW Clinical Social Worker Inpatient Care Management 12/06/2023 12:57 PM

## 2023-12-06 NOTE — Plan of Care (Signed)
°  Problem: Clinical Measurements: °Goal: Ability to maintain clinical measurements within normal limits will improve °Outcome: Progressing °  °Problem: Activity: °Goal: Risk for activity intolerance will decrease °Outcome: Progressing °  °Problem: Nutrition: °Goal: Adequate nutrition will be maintained °Outcome: Progressing °  °Problem: Skin Integrity: °Goal: Risk for impaired skin integrity will decrease °Outcome: Progressing °  °  °

## 2023-12-06 NOTE — Progress Notes (Signed)
 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  She was admitted with multiple electrolyte abnormality, including hypocalcemia, hypokalemia, hypomagnesemia, prolonged QT.  Developed sudden abdominal pain earlier in the hospitalization and underwent emergent exploratory laparotomy with placement of wound VAC and closure of internal hernia defect.    Postoperatively she was sedated and continue on vent support, subsequently return to the OR for small bowel resection, jejunostomy tube insertion and closure of abdomen.  Required multiple vasopressors for shock.  Eventually weaned off of pressors and extubated.  Transferred to the hospitalist service 11/7.  Procedures/Events 11/1 admission for multiple electrolyte abnormalities 11/2 sudden abdominal pain, CT showed pneumatosis, underwent emergent diagnostic laparoscopy converted to exploratory laparotomy, placement of AbThera VAC (non-disposable DME), closure of internal hernia defect  11/3 CVC insertion 11/4 small bowel resection, jejunostomy tube insertion, closure of abdomen    Assessment & Plan:   Principal Problem:   Hypocalcemia Active Problems:   Hypokalemia   Hypothyroidism   Chronic pain syndrome   Hypertension   PTSD (post-traumatic stress disorder)   Morbid obesity (HCC)   Fibromyalgia   Bipolar disorder, in partial remission, most recent episode depressed (HCC)   Generalized anxiety disorder   Hyperlipidemia   Hypomagnesemia   Shock (HCC)   Malnutrition of moderate degree   Thrombocytopenia  1-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  Status post PJP - Shock resolved, electrolyte has stabilized.  Status postsurgery. -  Surgery following and managing. Still have tube feedings through jejunostomy tube  Anemia Thrombocytopenia - Multifactorial, nutritional perioperative, ABLA. - Hemoglobin down to 6 on 11/11 received 1 unit of packed red blood cell.  Increase appropriately to 8.  Multiple electrolyte abnormality: Hypocalcemia, hypokalemia, hypomagnesemia, hypophosphatemia Micronutrients deficiency including copper , vitamin E, zinc  - Secondary to bariatric surgery - Continue  supplementation - Corrected calcium at 8--on oral and IV supplementation - IV magnesium .  Severe hypothyroidism: - TSH 147 on admission, T3 low T4 very low.  Noncompliant with levothyroxine . - Continue with Synthroid  - Needs repeat TSH in 2 weeks   Moderate malnutrition Overweight - Nutritionist following  PTSD, anxiety, bipolar disorder, ADD, fibromyalgia -  Complex social issues Questionable adrenal insufficiency - Steroids were considered on her previous [facility] admission, she needs to follow-up with endocrinologist as an outpatient. - Cortisol was at 11, 10 days ago.  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   Weight as of this encounter: 93.6 kg.   DVT prophylaxis: SCDs, resume Lovenox if hemoglobin remained stable Code Status: Full code Family Communication: Care discussed with patient Disposition Plan:  Status is: Inpatient Remains inpatient appropriate because: Management of postoperative care    Consultants:  General surgery  Procedures:  none  Antimicrobials:    Subjective: Patient wake up to voice, she reports on abdominal pain could not get pain medication last  night because her blood pressure was soft.  She is willing to work with physical therapy  Objective: Vitals:   12/05/23 2031 12/06/23 0500  12/06/23 0503 12/06/23 0750  BP: 103/67  (!) 94/59 98/64  Pulse: 90  89   Resp: 17  17   Temp: 98.8 F (37.1 C)  98.6 F (37 C)   TempSrc: Oral  Oral   SpO2: 100%  98%   Weight:  93.6 kg    Height:        Intake/Output Summary (Last 24 hours) at 12/06/2023 0753 Last data filed at 12/06/2023 0640 Gross per 24 hour  Intake 2787.59 ml  Output 3950 ml  Net -1162.41 ml   Filed Weights   12/04/23 0434 12/05/23 0500 12/06/23 0500  Weight: 94.3 kg 94.5 kg 93.6 kg    Examination:  General exam: Appears calm and comfortable  Respiratory system: Clear to auscultation. Respiratory effort normal. Cardiovascular system: S1 & S2 heard, RRR. No JVD, murmurs, rubs, gallops or clicks. No pedal edema. Gastrointestinal system: Abdomen is soft, J tube in placed.  Central nervous system: Alert and oriented.  Extremities: Symmetric 5 x 5 power.    Data Reviewed: I have personally reviewed following labs and imaging studies  CBC: Recent Labs  Lab 12/01/23 0500 12/02/23 0647 12/04/23 0258 12/05/23 0935 12/06/23 0601  WBC 4.1 5.3 7.9 10.0 10.1  NEUTROABS  --  1.7  --   --   --   HGB 7.5* 7.9* 7.0* 6.8* 8.7*  HCT 22.9* 23.7* 21.9* 20.7* 27.2*  MCV 92.3 91.2 95.2 95.0 97.5  PLT 27* 31* 88* 154 157   Basic Metabolic Panel: Recent Labs  Lab 12/03/23 0252 12/03/23 1710 12/04/23 0258 12/04/23 1801 12/05/23 0401 12/05/23 2006 12/06/23 0601  NA 140 136 134* 133* 134* 134* 133*  K 3.5 5.1 5.2* 5.2* 5.2* 4.7 4.8  CL 113* 113* 110 106 107 103 105  CO2 22 20* 21* 23 22 24 24   GLUCOSE 82 108* 101* 84 84 49* 78  BUN <5* 7 7 9 8 9 8   CREATININE 0.77 0.35* 0.35* 0.36* 0.38* 0.37* 0.36*  CALCIUM 7.5* 7.7* 7.7* 7.3* 7.2* 6.6* 6.9*  MG 1.7 1.7 1.6* 1.8 1.7  --   --   PHOS 2.6 1.9* 1.9* 3.3 2.5  --   --    GFR: Estimated Creatinine Clearance:  102.1 mL/min (A) (by C-G formula based on SCr of 0.36 mg/dL (L)). Liver Function Tests: Recent Labs  Lab 12/02/23 0647 12/03/23 0252 12/04/23 0258 12/05/23 0401  AST 23 59* 133* 43*  ALT 22 66* 43 31  ALKPHOS 154* 297* 397* 304*  BILITOT 0.4 0.8 0.3 0.2  PROT 4.2* 4.3* 4.2* 4.5*  ALBUMIN 2.3* 2.1* 1.8* 2.1*   No results for input(s): LIPASE, AMYLASE in the last 168 hours. No results for input(s): AMMONIA in the last 168 hours. Coagulation Profile: Recent Labs  Lab 11/29/23 1110  INR 1.5*   Cardiac Enzymes: No results for input(s): CKTOTAL, CKMB, CKMBINDEX, TROPONINI in the last 168 hours. BNP (last 3 results) No results for input(s): PROBNP in the last 8760 hours. HbA1C: No results for input(s): HGBA1C in the last 72 hours. CBG: Recent Labs  Lab 12/05/23 1641 12/05/23 2031 12/05/23 2307 12/06/23 0413 12/06/23 0717  GLUCAP 80 71 83 91 76   Lipid Profile: No results for input(s): CHOL, HDL, LDLCALC, TRIG, CHOLHDL, LDLDIRECT in the last 72 hours. Thyroid  Function Tests: No results for input(s): TSH, T4TOTAL, FREET4, T3FREE, THYROIDAB in the last 72 hours. Anemia Panel: No results for input(s): VITAMINB12, FOLATE, FERRITIN, TIBC, IRON, RETICCTPCT in the last 72 hours. Sepsis Labs: No results  for input(s): PROCALCITON, LATICACIDVEN in the last 168 hours.  Recent Results (from the past 240 hours)  Culture, blood (Routine X 2) w Reflex to ID Panel     Status: None   Collection Time: 11/26/23 10:19 PM   Specimen: BLOOD LEFT HAND  Result Value Ref Range Status   Specimen Description BLOOD LEFT HAND  Final   Special Requests   Final    BOTTLES DRAWN AEROBIC ONLY Blood Culture results may not be optimal due to an inadequate volume of blood received in culture bottles   Culture   Final    NO GROWTH 5 DAYS Performed at [facility], [address], [place], [place] [address]    Report Status 12/02/2023 FINAL   Final  Culture, blood (Routine X 2) w Reflex to ID Panel     Status: None   Collection Time: 11/26/23 10:19 PM   Specimen: BLOOD LEFT HAND  Result Value Ref Range Status   Specimen Description BLOOD LEFT HAND  Final   Special Requests   Final    BOTTLES DRAWN AEROBIC ONLY Blood Culture results may not be optimal due to an inadequate volume of blood received in culture bottles   Culture   Final    NO GROWTH 5 DAYS Performed at [facility], [address], [place], [place] [address]    Report Status 12/02/2023 FINAL  Final         Radiology Studies: No results found.      Scheduled Meds:  calcium citrate  200 mg of elemental calcium Per Tube TID WC   Chlorhexidine  Gluconate Cloth  6 each Topical Daily   feeding supplement (PROSource TF20)  60 mL Per Tube Daily   feeding supplement (VITAL 1.5 CAL)  1,000 mL Per J Tube Q24H   folic acid   1 mg Oral Daily   furosemide   20 mg Oral Daily   levothyroxine   200 mcg Oral Q0600   melatonin  3 mg Oral QHS   multivitamin with minerals  1 tablet Oral Daily   mouth rinse  15 mL Mouth Rinse 4 times per day   pantoprazole   40 mg Oral Daily   Ensure Max Protein  11 oz Oral Daily   sodium chloride  flush  3 mL Intravenous Q12H   vitamin A   100,000 Units Oral Daily   Followed by   NOREEN ON 12/08/2023] vitamin A   50,000 Units Oral Daily   Followed by   NOREEN ON 12/22/2023] vitamin A   10,000 Units Oral Daily   Vitamin D  (Ergocalciferol )  50,000 Units Oral Q7 days   Continuous Infusions:  calcium gluconate 930 mg of elemental calcium in sodium chloride  0.9 % 1,000 mL infusion 0.5 mg/kg/hr of elemental calcium (12/06/23 0243)   dextrose  30 mL/hr at 12/05/23 1000     LOS: 11 days    Time spent: 35 Minutes    Glennys Schorsch A Erina Hamme, MD [facility]   If 7PM-7AM, please contact night-coverage [email or web address]  12/06/2023, 7:53 AM

## 2023-12-06 NOTE — Progress Notes (Signed)
 IV Team at bedside for consult, unable to flush or obtain blood return from proximal and distal lumens, medial lumen remains occluded. Primary nurse aware and asked to obtain CVC removal order from provider. PIV placed at this time.

## 2023-12-06 NOTE — Progress Notes (Signed)
 Occupational Therapy Treatment Patient Details Name: Renee [name] MRN: [id number] DOB: Apr 06, 1975 Today's Date: 12/06/2023   History of present illness Patient is a 48 yr old female admitted 11/25/23 with diarrhea x 7 days and concern for electrolyte deficiency.  Developed severe abdominal pain and CT concern for SBO vs. Ischemic bowel.  Underwent emergent laparotomy 11/26/23 for hernia repair and left with open abdomen, on vent with pressor support then return to OR 11/28/23 for SBR, J tube placement and abdomen closure.  PMH positive for bipolar d/o, ADD, chronic pain, fibromyalgia, HTN, migraines, OSA, DDD, (cervical), polyarthralgia, Herpes simplex, and reports bariatric surgery in 2021.   OT comments  The patient was seen for functional strengthening, ADL instruction and progression of out of bed functional activity. She progressed to performing sit to stand using a RW, performing a step-pivot transfer to and from the bedside commode using a RW, and for performing toileting management at bedside commode level. She reported having moderate abdominal pain, though she indicated, I will not let the pain stop me. She was highly motivated towards therapy participation. She also stated, I will not take no for an answer. She was referring to her desire to accomplish her functional and personal goals. 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 Continue OT plan of care to maximize her functional independence & to decrease the risk for restricted participation in meaningful activities. Patient will benefit from intensive inpatient follow-up therapy, >3 hours/day.       If plan is discharge home, recommend the following:  A lot of help with bathing/dressing/bathroom;Assistance with cooking/housework;Direct supervision/assist for medications management;Direct supervision/assist for financial management;Assist for transportation;Help with stairs or ramp for  entrance;Supervision due to cognitive status   Equipment Recommendations  Other (comment) (to be determined pending functional progress)    Recommendations for Other Services      Precautions / Restrictions Precautions Precautions: Fall Precaution/Restrictions Comments: J-tube, abdominal incision Restrictions Weight Bearing Restrictions Per Provider Order: No       Mobility Bed Mobility Overal bed mobility: Needs Assistance   Rolling: Min assist Sidelying to sit: Min assist   Sit to supine: Min assist        Transfers Overall transfer level: Needs assistance Equipment used: Rolling walker (2 wheels) Transfers: Sit to/from Stand, Bed to chair/wheelchair/BSC Sit to Stand: Min assist     Step pivot transfers: Min assist           Balance     Sitting balance-Leahy Scale: Fair         Standing balance comment: CGA to min assist with RW           ADL either performed or assessed with clinical judgement   ADL Overall ADL's : Needs assistance/impaired     Grooming: Set up;Supervision/safety;Sitting;Wash/dry face Grooming Details (indicate cue type and reason): She performed face washing seated EOB.             Lower Body Dressing: Maximal assistance;Sitting/lateral leans   Toilet Transfer: Minimal assistance;BSC/3in1;Cueing for sequencing;Rolling walker (2 wheels) Toilet Transfer Details (indicate cue type and reason): Pt was instructed on performing a step pivot transfer to bedside commode using a RW. She required cues for walker placement and advancement, optional body positioning, sustained attention to task, and to reach back prior to sitting. Toileting- Clothing Manipulation and Hygiene: Moderate assistance;Sit to/from stand;Cueing for safety Toileting - Clothing Manipulation Details (indicate cue type and reason): She required assist for steadying in standing, mod  assist for clothing management, and CGA while she performed anterior peri-hygiene in  standing. Toileting management implemented at bedside commode level.             Vision   Additional Comments: L eye vision loss (baseline)         Communication Communication Communication: No apparent difficulties   Cognition Arousal: Alert Behavior During Therapy: Lability Cognition: No family/caregiver present to determine baseline, Difficult to assess         Attention impairment (select first level of impairment): Sustained attention Executive functioning impairment (select all impairments): Organization, Problem solving OT - Cognition Comments: required intermittent cues and redirection to tasks        Following commands: Impaired Following commands impaired: Follows one step commands with increased time      Cueing   Cueing Techniques: Verbal cues, Gestural cues  Exercises              Pertinent Vitals/ Pain       Pain Assessment Pain Assessment: Faces Pain Score: 4  Pain Location: abdomen Pain Descriptors / Indicators: Operative site guarding Pain Intervention(s): Limited activity within patient's tolerance, Monitored during session, Repositioned   Frequency  Min 3X/week        Progress Toward Goals  OT Goals(current goals can now be found in the care plan section)  Progress towards OT goals: Progressing toward goals  Acute Rehab OT Goals 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 OT Goal Formulation: With patient Time For Goal Achievement: 12/15/23 Potential to Achieve Goals: Good  Plan         AM-PAC OT 6 Clicks Daily Activity     Outcome Measure   Help from another person eating meals?: Total (feeding tube) Help from another person taking care of personal grooming?: A Little Help from another person toileting, which includes using toliet, bedpan, or urinal?: A Lot Help from another person bathing (including washing, rinsing, drying)?: A Lot Help from another person to put  on and taking off regular upper body clothing?: A Little Help from another person to put on and taking off regular lower body clothing?: A Lot 6 Click Score: 13    End of Session Equipment Utilized During Treatment: Rolling walker (2 wheels);Gait belt  OT Visit Diagnosis: Unsteadiness on feet (R26.81);Other abnormalities of gait and mobility (R26.89);Muscle weakness (generalized) (M62.81);Pain   Activity Tolerance Patient tolerated treatment well   Patient Left in bed;with call bell/phone within reach;with bed alarm set   Nurse Communication Mobility status        Time: 8341-8273 OT Time Calculation (min): 28 min  Charges: OT General Charges $OT Visit: 1 Visit OT Treatments $Self Care/Home Management : 8-22 mins $Therapeutic Activity: 8-22 mins     Delanna JINNY Lesches, OTR/L 12/06/2023, 6:07 PM

## 2023-12-06 NOTE — Progress Notes (Signed)
 Progress Note  8 Days Post-Op  Subjective: Patient reports that pain remains intermittent but improved. Having bowel movements and flatulence. Tolerating diet at this point - denies any nausea or vomiting.  ROS  All negative with the exception of above.  Objective: Vital signs in last 24 hours: Temp:  [98.4 F (36.9 C)-98.8 F (37.1 C)] 98.6 F (37 C) (11/12 0503) Pulse Rate:  [88-95] 89 (11/12 0503) Resp:  [16-18] 17 (11/12 0503) BP: (93-103)/(59-69) 98/64 (11/12 0750) SpO2:  [94 %-100 %] 98 % (11/12 0503) Weight:  [93.6 kg] 93.6 kg (11/12 0500) Last BM Date : 12/05/23  Intake/Output from previous day: 11/11 0701 - 11/12 0700 In: 2787.6 [I.V.:1721; Aonni:131; IV Piggyback:198.6] Out: 3950 [Urine:3950] Intake/Output this shift: No intake/output data recorded.  PE: General: Pleasant female who is laying in bed in NAD. HEENT: Head is normocephalic, atraumatic.   Heart: HR normal during encounter. Lungs: Respiratory effort nonlabored. Abd: Soft, NT, ND. Surrounding area of abdominal wound clean and dry. Packing removed yesterday afternoon - photos in chart. Wound healing appropriately, beginning to granulate. Skin: Warm and dry.   Lab Results:  Recent Labs    12/05/23 0935 12/06/23 0601  WBC 10.0 10.1  HGB 6.8* 8.7*  HCT 20.7* 27.2*  PLT 154 157   BMET Recent Labs    12/05/23 2006 12/06/23 0601  NA 134* 133*  K 4.7 4.8  CL 103 105  CO2 24 24  GLUCOSE 49* 78  BUN 9 8  CREATININE 0.37* 0.36*  CALCIUM 6.6* 6.9*   PT/INR No results for input(s): LABPROT, INR in the last 72 hours. CMP     Component Value Date/Time   NA 133 (L) 12/06/2023 0601   NA 137 04/22/2019 1634   K 4.8 12/06/2023 0601   CL 105 12/06/2023 0601   CO2 24 12/06/2023 0601   GLUCOSE 78 12/06/2023 0601   BUN 8 12/06/2023 0601   BUN 8 04/22/2019 1634   CREATININE 0.36 (L) 12/06/2023 0601   CREATININE 0.73 08/02/2016 1237   CALCIUM 6.9 (L) 12/06/2023 0601   CALCIUM INTEDT  11/25/2023 1529   PROT 4.5 (L) 12/05/2023 0401   PROT 5.4 (L) 04/22/2019 1634   ALBUMIN 2.1 (L) 12/05/2023 0401   ALBUMIN 3.0 (L) 04/22/2019 1634   AST 43 (H) 12/05/2023 0401   ALT 31 12/05/2023 0401   ALKPHOS 304 (H) 12/05/2023 0401   BILITOT 0.2 12/05/2023 0401   BILITOT 0.3 04/22/2019 1634   GFRNONAA >60 12/06/2023 0601   GFRAA 80 04/22/2019 1634   Lipase     Component Value Date/Time   LIPASE 82 (H) 03/08/2022 1940       Studies/Results: No results found.  Anti-infectives: Anti-infectives (From admission, onward)    Start     Dose/Rate Route Frequency Ordered Stop   11/26/23 2300  cefTRIAXone  (ROCEPHIN ) 2 g in sodium chloride  0.9 % 100 mL IVPB        2 g 200 mL/hr over 30 Minutes Intravenous Every 24 hours 11/26/23 2210 12/01/23 0028   11/26/23 2300  metroNIDAZOLE  (FLAGYL ) IVPB 500 mg        500 mg 100 mL/hr over 60 Minutes Intravenous Every 12 hours 11/26/23 2210 11/30/23 2347        Assessment/Plan POD 6: LAPAROTOMY, EXPLORATORY (N/A) - Exploratory Laparotomy, Possible Bowel Resection EXCISION, SMALL INTESTINE WITH ANASTOMOSIS SECONDARY CLOSURE OF WOUND AND J TUBE CREATION with Dr. Stevie 11/4. -Vitals stable -WBC remains normal HGB stable at 8.7.  - Diet as  tolerated -Will continue to follow.  FEN: Carb modified; Advancement of j tube feeds q6h to goal was 11/9. Cal ct, nutrition VTE: None currently ID: None.   LOS: 11 days   I reviewed hospitalist notes, nursing notes, last 24 h vitals and pain scores, last 48 h intake and output, last 24 h labs and trends, and last 24 h imaging results.   Lonni CHRISTELLA Pizza, MD  [facility] Surgery 12/06/2023, 8:34 AM Please see Amion for pager number during day hours 7:00am-4:30pm

## 2023-12-06 NOTE — Progress Notes (Signed)
 Physical Therapy Treatment Patient Details Name: Renee [name] MRN: [id number] DOB: 07-24-1975 Today's Date: 12/06/2023   History of Present Illness Patient is a 48 y/o female admitted 11/25/23 with diarrhea x 7 days and concern for electrolyte deficiency.  Developed severe abdominal pain and CT concern for SBO vs. Ischemic bowel.  Underwent emergent laparotomy 11/26/23 for hernia repair and left with open abdomen, on vent with pressor support then return to OR 11/28/23 for SBR, J tube placement and abdomen closure.  PMH positive for bipolar d/o, ADD, chronic pain, fibromyalgia, HTN, migraines, OSA, DDD, (cervical), polyarthralgia, Herpes simplex, and reports bariatric surgery in 2021.    PT Comments  Much improved activity tolerance today. Pt put forth very good effort. Min A bed mobility. Min assist sit to stand from elevated bed, mod assist to stand from recliner, min A to take several pivotal steps to recliner with RW. Pt performed sit to stand x 4 with RW. Pt reported dizziness in standing, standing BP 102/71, seated BP 103/77. Pt performed BLE strengthening exercises in sitting and standing. Discussed bracing abdomen with pillow during seated LE exercises. Excellent progress with mobility today.     If plan is discharge home, recommend the following: A little help with bathing/dressing/bathroom;Assist for transportation;Help with stairs or ramp for entrance;A little help with walking and/or transfers   Can travel by private vehicle        Equipment Recommendations  Other (comment) (TBD at next venue)    Recommendations for Other Services Rehab consult     Precautions / Restrictions Precautions Precautions: Fall Recall of Precautions/Restrictions: Intact Precaution/Restrictions Comments: J-tube, abdominal incision Restrictions Weight Bearing Restrictions Per Provider Order: No     Mobility  Bed Mobility Overal bed mobility: Needs Assistance Bed Mobility: Rolling Rolling: Min  assist Sidelying to sit: Min assist, HOB elevated, Used rails       General bed mobility comments: verbal cues for technique for log roll, min A to raise trunk, HOB up ~30*    Transfers Overall transfer level: Needs assistance Equipment used: Rolling walker (2 wheels) Transfers: Sit to/from Stand Sit to Stand: Min assist, Mod assist, From elevated surface           General transfer comment: verbal cues for hand placement, min A to power up, sit to stand x 4 trials. Min A from elevated bed, mod A from recliner    Ambulation/Gait Ambulation/Gait assistance: Contact guard assist Gait Distance (Feet): 3 Feet Assistive device: Rolling walker (2 wheels) Gait Pattern/deviations: Step-to pattern, Decreased stride length Gait velocity: decr     General Gait Details: several pivotal steps bed to recliner with RW, distance limited by pain and fatigue   Stairs             Wheelchair Mobility     Tilt Bed    Modified Rankin (Stroke Patients Only)       Balance   Sitting-balance support: Feet supported, Single extremity supported Sitting balance-Leahy Scale: Fair Sitting balance - Comments: UE support needed or A for balance while at EOB   Standing balance support: Bilateral upper extremity supported, During functional activity, Reliant on assistive device for balance Standing balance-Leahy Scale: Poor                              Communication Communication Communication: No apparent difficulties  Cognition Arousal: Alert Behavior During Therapy: Lability   PT - Cognitive impairments: No apparent impairments  PT - Cognition Comments: slowed speech Following commands: Impaired Following commands impaired: Follows one step commands with increased time    Cueing Cueing Techniques: Verbal cues, Gestural cues  Exercises General Exercises - Lower Extremity Long Arc Quad: AROM, Both, Seated, 10 reps Hip  Flexion/Marching: AROM, Both, 10 reps, Seated, Limitations, Standing Hip Flexion/Marching Limitations: x 10 sitting and x10 standing    General Comments        Pertinent Vitals/Pain Pain Assessment Pain Score: 6  Pain Location: abdomen Pain Descriptors / Indicators: Operative site guarding Pain Intervention(s): Limited activity within patient's tolerance, Monitored during session, Premedicated before session, Repositioned    Home Living                          Prior Function            PT Goals (current goals can now be found in the care plan section) Acute Rehab PT Goals Patient Stated Goal: get stronger, return to independent PT Goal Formulation: With patient Time For Goal Achievement: 12/15/23 Potential to Achieve Goals: Fair Progress towards PT goals: Progressing toward goals    Frequency    Min 3X/week      PT Plan      Co-evaluation              AM-PAC PT 6 Clicks Mobility   Outcome Measure  Help needed turning from your back to your side while in a flat bed without using bedrails?: A Little Help needed moving from lying on your back to sitting on the side of a flat bed without using bedrails?: A Little Help needed moving to and from a bed to a chair (including a wheelchair)?: A Little Help needed standing up from a chair using your arms (e.g., wheelchair or bedside chair)?: A Lot Help needed to walk in hospital room?: A Lot Help needed climbing 3-5 steps with a railing? : A Lot 6 Click Score: 15    End of Session   Activity Tolerance: Patient tolerated treatment well Patient left: in chair;with call bell/phone within reach;with chair alarm set Nurse Communication: Mobility status PT Visit Diagnosis: Other abnormalities of gait and mobility (R26.89);Muscle weakness (generalized) (M62.81);Pain     Time: 1423-1510 PT Time Calculation (min) (ACUTE ONLY): 47 min  Charges:    $Therapeutic Exercise: 8-22 mins $Therapeutic Activity:  23-37 mins PT General Charges $$ ACUTE PT VISIT: 1 Visit                     Sylvan Delon Copp PT 12/06/2023  Acute Rehabilitation Services  Office [phone]

## 2023-12-06 NOTE — TOC Progression Note (Signed)
 Transition of Care [facility]) - Progression Note    Patient Details  Name: Renee [name] MRN: [id number] Date of Birth: Jun 18, 1975  Transition of [facility]) CM/SW Contact  Heather [name] Saltness, LCSW Phone Number: 12/06/2023, 3:54 PM  Clinical Narrative:     ADDENDUM  3:44 PM - CSW received secure chat from PT and CIR admissions coordinator to discuss review for CIR. Per PT, pt made excellent progress today during session. Per admissions coordinator, Leita Kleine, CIR will review pt again for candidacy tomorrow. TOC will continue to follow.  CSW met with pt at bedside to discuss discharge planning and PT's recommendation for SNF. Pt agreeable to SNF recommendation. Level II PASRR pending. CSW completed FL2 and sent referrals to SNF locations in [place] and surrounding areas.      Barriers to Discharge: Continued Medical Work up, Other (must enter comment) (Having procedure today/ On the ventilator)   Expected Discharge Plan and Services In-house Referral: NA Discharge Planning Services: CM Consult Post Acute Care Choice: NA Living arrangements for the past 2 months: Single Family Home                 DME Arranged: N/A DME Agency: NA       HH Arranged: NA HH Agency: NA         Social Drivers of Health (SDOH) Interventions SDOH Screenings   Food Insecurity: Food Insecurity Present (11/25/2023)  Housing: High Risk (11/25/2023)  Transportation Needs: No Transportation Needs (11/25/2023)  Utilities: At Risk (11/25/2023)  Depression (PHQ2-9): Low Risk  (08/21/2020)  Financial Resource Strain: High Risk (11/03/2022)   Received from [facility]  Tobacco Use: Medium Risk (11/25/2023)    Readmission Risk Interventions    11/28/2023    9:46 AM  Readmission Risk Prevention Plan  Transportation Screening Complete  PCP or Specialist Appt within 5-7 Days Complete  Home Care Screening Complete  Medication Review (RN CM) Complete    Signed: Heather Saltness, MSW,  LCSW Clinical Social Worker Inpatient Care Management 12/06/2023 3:57 PM

## 2023-12-07 ENCOUNTER — Inpatient Hospital Stay (HOSPITAL_COMMUNITY)

## 2023-12-07 LAB — GLUCOSE, CAPILLARY
Glucose-Capillary: 52 mg/dL — ABNORMAL LOW (ref 70–99)
Glucose-Capillary: 61 mg/dL — ABNORMAL LOW (ref 70–99)
Glucose-Capillary: 67 mg/dL — ABNORMAL LOW (ref 70–99)
Glucose-Capillary: 70 mg/dL (ref 70–99)
Glucose-Capillary: 73 mg/dL (ref 70–99)
Glucose-Capillary: 76 mg/dL (ref 70–99)
Glucose-Capillary: 77 mg/dL (ref 70–99)
Glucose-Capillary: 82 mg/dL (ref 70–99)
Glucose-Capillary: 87 mg/dL (ref 70–99)

## 2023-12-07 MED ORDER — VITAMIN C 500 MG PO TABS
500.0000 mg | ORAL_TABLET | Freq: Two times a day (BID) | ORAL | Status: AC
  Administered 2023-12-08 – 2023-12-09 (×4): 500 mg via ORAL
  Filled 2023-12-07 (×4): qty 1

## 2023-12-07 MED ORDER — ENSURE MAX PROTEIN PO LIQD
11.0000 [oz_av] | Freq: Two times a day (BID) | ORAL | Status: DC
  Administered 2023-12-07 – 2023-12-10 (×6): 11 [oz_av] via ORAL
  Filled 2023-12-07 (×8): qty 330

## 2023-12-07 MED ORDER — COPPER 2 MG PO TABS: 2.0000 mg | Freq: Three times a day (TID) | Status: DC

## 2023-12-07 MED ORDER — DEXTROSE 50 % IV SOLN
25.0000 mL | Freq: Once | INTRAVENOUS | Status: AC
  Administered 2023-12-07: 25 mL via INTRAVENOUS
  Filled 2023-12-07: qty 50

## 2023-12-07 MED ORDER — TRAZODONE HCL 50 MG PO TABS
50.0000 mg | ORAL_TABLET | Freq: Every day | ORAL | Status: DC
  Administered 2023-12-07 – 2023-12-08 (×2): 50 mg via ORAL
  Filled 2023-12-07 (×2): qty 1

## 2023-12-07 MED ORDER — SELENIUM 200 MCG PO TABS
100.0000 ug | ORAL_TABLET | Freq: Every day | ORAL | Status: DC
  Administered 2023-12-07 – 2023-12-10 (×4): 100 ug via ORAL
  Filled 2023-12-07: qty 1
  Filled 2023-12-07: qty 2
  Filled 2023-12-07: qty 1
  Filled 2023-12-07: qty 2

## 2023-12-07 MED ORDER — ZINC SULFATE 220 (50 ZN) MG PO CAPS
220.0000 mg | ORAL_CAPSULE | Freq: Every day | ORAL | Status: DC
  Administered 2023-12-08 – 2023-12-10 (×3): 220 mg via ORAL
  Filled 2023-12-07 (×3): qty 1

## 2023-12-07 MED ORDER — COPPER 2 MG PO TABS
4.0000 mg | Freq: Two times a day (BID) | Status: DC
  Administered 2023-12-08 – 2023-12-11 (×7): 4 mg via ORAL
  Filled 2023-12-07 (×8): qty 2

## 2023-12-07 MED ORDER — VITAMIN D (ERGOCALCIFEROL) 1.25 MG (50000 UNIT) PO CAPS
50000.0000 [IU] | ORAL_CAPSULE | ORAL | Status: DC
  Filled 2023-12-07: qty 1

## 2023-12-07 MED ORDER — VITAMIN C 500 MG PO TABS
500.0000 mg | ORAL_TABLET | Freq: Every day | ORAL | Status: DC
  Administered 2023-12-10 – 2023-12-11 (×2): 500 mg via ORAL
  Filled 2023-12-07 (×2): qty 1

## 2023-12-07 MED ORDER — VITAMIN E 45 MG (100 UNIT) PO CAPS
400.0000 [IU] | ORAL_CAPSULE | Freq: Every day | ORAL | Status: DC
  Administered 2023-12-07 – 2023-12-10 (×4): 400 [IU] via ORAL
  Filled 2023-12-07 (×4): qty 4

## 2023-12-07 MED ORDER — COPPER 2 MG PO TABS: 4.0000 mg | Freq: Every day | Status: DC

## 2023-12-07 MED ORDER — DEXTROSE 50 % IV SOLN: 1.0000 | Freq: Once | INTRAVENOUS | Status: AC

## 2023-12-07 MED ORDER — DEXTROSE 50 % IV SOLN
INTRAVENOUS | Status: AC
  Administered 2023-12-07: 50 mL via INTRAVENOUS
  Filled 2023-12-07: qty 50

## 2023-12-07 MED ORDER — COPPER 2 MG PO TABS: 2.0000 mg | Freq: Every day | Status: DC

## 2023-12-07 NOTE — PMR Pre-admission (Signed)
 PMR Admission Coordinator Pre-Admission Assessment  Patient: Renee [name] is an 48 y.o., [name] MRN: [id number] DOB: 1975/06/29 Height: 5' 7 (170.2 cm) Weight: 93.6 kg  Insurance Information HMO:     PPO:      PCP:      IPA:      80/20:      OTHER:  PRIMARY: [place] Medicaid [employer]      Policy#:  [id number]          Subscriber: Pt CM Name: UM dept      Phone#: [phone]     Fax#: [phone] Pre-Cert#: [id number]  Received insurance approval on 12/08/23. Pt approved for 8 days beginning 12/08/23-12/15/23. Review date 12/15/23.    Employer:  Benefits:  Phone #:      Name:  Eff. Date: 01/25/2023 to 03/23/2024      Deduct:       Out of Pocket Max:       Life Max:  CIR:       SNF:  Outpatient:      Co-Pay:  Home Health:       Co-Pay:  DME:      Co-Pay:  Providers: In network  SECONDARY:       Policy#:      Phone#:   Artist:       Phone#:   The Data Processing Manager" for patients in Inpatient Rehabilitation Facilities with attached "Privacy Act Statement-Health Care Records" was provided and verbally reviewed with: N/A  Emergency Contact Information Contact Information     Name Relation Home Work Renee [name] Mother [phone]  [phone]      Other Contacts     Name Relation Home Work Mobile   Renee [name] Father   [phone]   Renee [name]   [phone]       Current Medical History  Patient Admitting Diagnosis: SBO S/p ex lap History of Present Illness:  Renee [name] is a 48 year old right-handed [name] with history of hypertension, hyperlipidemia, hypothyroidism, anxiety/bipolar disorder followed by Renee [name], ADD, PTSD, obesity with a BMI of 26.75, 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  Presented to [facility]/25/2023 with persistent hypokalemia as well as abdominal pain with admission chemistries unremarkable  except WBC 13,400, potassium 2.7, magnesium  1.1.  Noted in the ED blood pressure in the 90-100 systolic.  Patient received 4 g magnesium , 30 mill equivalents IV and 40 mill equivalents potassium started on IV fluids.  CT of the abdomen showed pneumatosis and large portal venous gas.  Underwent diagnostic laparoscopic converted to exploratory laparotomy placement of ABThera VAC closure of internal hernia defect 11/26/2023 per Renee [name].  Hospital course on 11/3 CVC insertion as well as 11/4 small bowel resection jejunostomy tube insertion closure of abdomen complicated by hemorrhagic shock and electrolyte abnormality..  Hemoglobin dropped down to 6 on 11/11 received 1 unit packed red blood cells increasing to 8.0.  Multiple electrolyte abnormalities hypocalcemia, hypokalemia, hypomagnesemia, hypophosphatemia and maintain on supplementation including IV calcium supplement.  Findings of severe hypothyroidism TSH 147 on admission T3 low T4 very low noted be noncompliant with levothyroxine  and currently continues on Synthroid .  There was some question of possible adrenal insufficiency steroids were considered on a previous admission at [facility] she will need to follow-up with endocrinologist as an outpatient noted latest cortisol level of 11.  Her diet has been advanced to a regular consistency.  Therapy evaluations completed due to patient's debility was  admitted for a comprehensive rehab program.    Patient's medical record from [facility] has been reviewed by the rehabilitation admission coordinator and physician.  Past Medical History  Past Medical History:  Diagnosis Date   ADD (attention deficit disorder with hyperactivity)    Allergy    Anemia    Anxiety and depression    followed by Renee [name] and Renee [name] at [facility]   Bipolar disorder [facility])    sees Dr. Emilio [name]   Chronic nausea    Chronic pain    DDD (degenerative disc disease), cervical 02/24/2015   C4 foraminal  narrowing-chronic pain    Dermatitis    Edema    Fibromyalgia    GERD (gastroesophageal reflux disease)    Hearing difficulty    Herpes simplex    Hypertension    Hypothyroidism    Migraines    OSA (obstructive sleep apnea) 01/26/2018   HST 02/08/18 AHI 1.0 is not diagnostic of obstructive sleep apnea   Polyarthralgia    Sepsis secondary to UTI (HCC) 03/09/2022   Skin abnormalities    sees [facility]   Urinary incontinence     Has the patient had major surgery during 100 days prior to admission? Yes  Family History   family history includes Alcohol  abuse in her brother, paternal grandfather, and paternal grandmother; Anxiety disorder in her father and mother; Arthritis in her maternal grandfather; Bipolar disorder in her maternal grandmother; Cancer in her maternal grandmother and mother; Diabetes in her father and paternal grandmother; Drug abuse in her brother; Heart disease in her father; Neuropathy in her mother.  Current Medications  Current Facility-Administered Medications:    acetaminophen  (TYLENOL ) tablet 650 mg, 650 mg, Per Tube, Q6H PRN, Renee Toribio SQUIBB, [name], 650 mg at 12/07/23 0751   [START ON 12/08/2023] ascorbic acid (VITAMIN C) tablet 500 mg, 500 mg, Oral, BID **FOLLOWED BY** [START ON 12/10/2023] ascorbic acid (VITAMIN C) tablet 500 mg, 500 mg, Oral, Daily, Regalado, Renee [name]   calcium citrate (CALCITRATE - dosed in mg elemental calcium) tablet 950 mg, 200 mg of elemental calcium, Oral, TID WC, Regalado, Renee [name], 950 mg at 12/07/23 1241   calcium gluconate 930 mg of elemental calcium in sodium chloride  0.9 % 1,000 mL infusion, 0.5 mg/kg/hr of elemental calcium (Adjusted), Intravenous, Continuous, Renee Toribio SQUIBB, [name], Last Rate: 38.4 mL/hr at 12/07/23 0500, 0.5 mg/kg/hr of elemental calcium at 12/07/23 0500   [START ON 12/08/2023] copper  tablet 4 mg, 4 mg, Oral, BID **FOLLOWED BY** [START ON 12/15/2023] copper  tablet 2 mg, 2 mg, Oral, TID **FOLLOWED  BY** [START ON 12/22/2023] copper  tablet 4 mg, 4 mg, Oral, Daily **FOLLOWED BY** [START ON 12/29/2023] copper  tablet 2 mg, 2 mg, Oral, Daily, Regalado, Renee [name]   dextrose  10 % infusion, , Intravenous, Continuous, Regalado, Renee [name], Last Rate: 50 mL/hr at 12/07/23 0752, Rate Change at 12/07/23 0752   fentaNYL  (SUBLIMAZE ) injection 25-50 mcg, 25-50 mcg, Intravenous, Q2H PRN, Renee Toribio SQUIBB, [name], 50 mcg at 12/07/23 1241   folic acid  (FOLVITE ) tablet 1 mg, 1 mg, Oral, Daily, Casimir Camelia RAMAN, RPH, 1 mg at 12/07/23 1003   hydrOXYzine  (ATARAX ) tablet 25 mg, 25 mg, Oral, Q6H PRN, Chavez, Abigail, NP, 25 mg at 12/07/23 1008   influenza vac split trivalent PF (FLUZONE) injection 0.5 mL, 0.5 mL, Intramuscular, Prior to discharge, Seena Marsa NOVAK, [name]   levothyroxine  (SYNTHROID ) tablet 200 mcg, 200 mcg, Oral, Q0600, Casimir Camelia RAMAN, RPH, 200 mcg  at 12/07/23 0444   melatonin tablet 3 mg, 3 mg, Oral, QHS, Goodrich, Daniel P, [name], 3 mg at 12/06/23 2345   multivitamin with minerals tablet 1 tablet, 1 tablet, Oral, Daily, Renee Toribio SQUIBB, [name], 1 tablet at 12/06/23 1606   naloxone (NARCAN) injection 0.2-0.4 mg, 0.2-0.4 mg, Intravenous, PRN, Bowser, Grace E, NP, 0.4 mg at 11/29/23 1255   ondansetron  (ZOFRAN ) injection 4 mg, 4 mg, Intravenous, Q6H PRN, Renee Toribio SQUIBB, [name], 4 mg at 12/06/23 9162   oxyCODONE  (Oxy IR/ROXICODONE ) immediate release tablet 5-10 mg, 5-10 mg, Oral, Q6H PRN, Regalado, Renee [name], 10 mg at 12/07/23 0751   pantoprazole  (PROTONIX ) EC tablet 40 mg, 40 mg, Oral, Daily, Casimir Camelia RAMAN, RPH, 40 mg at 12/07/23 1004   pneumococcal 20-valent conjugate vaccine (PREVNAR 20) injection 0.5 mL, 0.5 mL, Intramuscular, Prior to discharge, Simaan, Elizabeth S, PA-C   protein supplement (ENSURE MAX) liquid, 11 oz, Oral, BID, Regalado, Renee [name]   selenium tablet 100 mcg, 100 mcg, Oral, Daily, Regalado, Renee [name]   sodium chloride  flush (NS) 0.9 % injection 3 mL, 3 mL,  Intravenous, Q12H, Simaan, Elizabeth S, PA-C, 3 mL at 12/07/23 1004   traZODone (DESYREL) tablet 50 mg, 50 mg, Oral, QHS, Regalado, Renee [name]   [COMPLETED] vitamin A  capsule 100,000 Units, 100,000 Units, Oral, Daily, 100,000 Units at 12/07/23 1008 **FOLLOWED BY** [START ON 12/08/2023] vitamin A  capsule 50,000 Units, 50,000 Units, Oral, Daily **FOLLOWED BY** [START ON 12/22/2023] vitamin A  capsule 10,000 Units, 10,000 Units, Oral, Daily, Renee Toribio SQUIBB, [name]   [START ON 12/11/2023] Vitamin D  (Ergocalciferol ) (DRISDOL ) 1.25 MG (50000 UNIT) capsule 50,000 Units, 50,000 Units, Oral, Q7 days, Regalado, Renee [name]   vitamin E capsule 400 Units, 400 Units, Oral, Daily, Regalado, Renee [name]   [START ON 12/08/2023] zinc  sulfate (50mg  elemental zinc ) capsule 220 mg, 220 mg, Oral, Daily, Regalado, Renee [name]  Patients Current Diet:  Diet Order             Diet regular Room service appropriate? Yes; Fluid consistency: Thin  Diet effective now                   Precautions / Restrictions Precautions Precautions: Fall Precaution/Restrictions Comments: J-tube, abdominal incision Restrictions Weight Bearing Restrictions Per Provider Order: No   Has the patient had 2 or more falls or a fall with injury in the past year? Yes  Prior Activity Level Community (5-7x/wk): Active in the community PTA  Prior Functional Level Self Care: Did the patient need help bathing, dressing, using the toilet or eating? Independent  Indoor Mobility: Did the patient need assistance with walking from room to room (with or without device)? Independent  Stairs: Did the patient need assistance with internal or external stairs (with or without device)? Independent  Functional Cognition: Did the patient need help planning regular tasks such as shopping or remembering to take medications? Independent  Patient Information Are you of Hispanic, Latino/a,or Spanish origin?: A. No, not of Hispanic, Latino/a, or  Spanish origin What is your race?: A. White Do you need or want an interpreter to communicate with a doctor or health care staff?: 0. No  Patient's Response To:  Health Literacy and Transportation Is the patient able to respond to health literacy and transportation needs?: Yes Health Literacy - How often do you need to have someone help you when you read instructions, pamphlets, or other written material from your doctor or pharmacy?: Never In the  past 12 months, has lack of transportation kept you from medical appointments or from getting medications?: Yes In the past 12 months, has lack of transportation kept you from meetings, work, or from getting things needed for daily living?: No  Journalist, Newspaper / Equipment Home Equipment: BSC/3in1, Hospital bed, [place] - single point  Prior Device Use: Indicate devices/aids used by the patient prior to current illness, exacerbation or injury? None of the above  Current Functional Level Cognition  Orientation Level: Oriented X4    Extremity Assessment (includes Sensation/Coordination)  Upper Extremity Assessment: Generalized weakness, Right hand dominant RUE Coordination: decreased fine motor, decreased gross motor LUE Coordination: decreased fine motor, decreased gross motor  Lower Extremity Assessment: Defer to PT evaluation RLE Deficits / Details: AROM WFL, strength 3/5 or greater except painful with hip flexion due to abdominal incisions LLE Deficits / Details: AROM WFL, strength 3/5 or greater except painful with hip flexion due to abdominal incisions    ADLs  Overall ADL's : Needs assistance/impaired Eating/Feeding: Set up, Sitting (spoon and cup to mouth EOB) Grooming: Set up, Supervision/safety, Sitting, Wash/dry face Grooming Details (indicate cue type and reason): She performed face washing seated EOB. Upper Body Bathing: Moderate assistance, Sitting Lower Body Bathing: Total assistance, Bed level Upper Body Dressing :  Maximal assistance, Moderate assistance, Sitting Upper Body Dressing Details (indicate cue type and reason): EOB Lower Body Dressing: Maximal assistance, Sitting/lateral leans Toilet Transfer: Minimal assistance, BSC/3in1, Cueing for sequencing, Rolling walker (2 wheels) Toilet Transfer Details (indicate cue type and reason): Pt was instructed on performing a step pivot transfer to bedside commode using a RW. She required cues for walker placement and advancement, optional body positioning, sustained attention to task, and to reach back prior to sitting. Toileting- Clothing Manipulation and Hygiene: Moderate assistance, Sit to/from stand, Cueing for safety Toileting - Clothing Manipulation Details (indicate cue type and reason): She required assist for steadying in standing, mod assist for clothing management, and CGA while she performed anterior peri-hygiene in standing. Toileting management implemented at bedside commode level. Functional mobility during ADLs: Moderate assistance, +2 for physical assistance, Cueing for sequencing (use of rails and bed features for EOB sitting and scooting laterally) General ADL Comments: pain and activity tolerance limited session to EOB activity but significant increase in overall tolerance from IE    Mobility  Overal bed mobility: Needs Assistance Bed Mobility: Rolling Rolling: Min assist Sidelying to sit: Min assist Sit to supine: Min assist General bed mobility comments: verbal cues for technique for log roll, min A to raise trunk, HOB up ~30*    Transfers  Overall transfer level: Needs assistance Equipment used: Rolling walker (2 wheels) Transfers: Sit to/from Stand, Bed to chair/wheelchair/BSC Sit to Stand: Min assist Bed to/from chair/wheelchair/BSC transfer type:: Step pivot Step pivot transfers: Min assist General transfer comment: verbal cues for hand placement, min A to power up, sit to stand x 4 trials. Min A from elevated bed, mod A from  recliner    Ambulation / Gait / Stairs / Wheelchair Mobility  Ambulation/Gait Ambulation/Gait assistance: Clinical Research Associate (Feet): 3 Feet Assistive device: Rolling walker (2 wheels) Gait Pattern/deviations: Step-to pattern, Decreased stride length General Gait Details: several pivotal steps bed to recliner with RW, distance limited by pain and fatigue Gait velocity: decr    Posture / Balance Dynamic Sitting Balance Sitting balance - Comments: UE support needed or A for balance while at EOB Balance Overall balance assessment: Needs assistance (EOB ADL's then fatigue  and pain limited mobility progression) Sitting-balance support: Feet supported, Single extremity supported Sitting balance-Leahy Scale: Fair Sitting balance - Comments: UE support needed or A for balance while at EOB Standing balance support: Bilateral upper extremity supported, During functional activity, Reliant on assistive device for balance Standing balance-Leahy Scale: Poor Standing balance comment: CGA to min assist with RW    Special considerations/life events  Skin abdominal wound, J tube   Previous Home Environment (from acute therapy documentation) Living Arrangements: Spouse/significant other Available Help at Discharge: Family Type of Home: House Home Layout: One level Home Access: Stairs to enter Entrance Stairs-Rails: None Entrance Stairs-Number of Steps: 3 Bathroom Shower/Tub: Engineer, Manufacturing Systems: Standard Home Care Services: No Additional Comments: thinks the [facility] was placed in the attic  Discharge Living Setting Plans for Discharge Living Setting: Patient's home, House Type of Home at Discharge: House Discharge Home Layout: One level Discharge Home Access: Stairs to enter Entrance Stairs-Rails: None Entrance Stairs-Number of Steps: 3 Discharge Bathroom Shower/Tub: Tub/shower unit Discharge Bathroom Toilet: Standard Discharge Bathroom Accessibility: Yes How  Accessible: Accessible via wheelchair, Accessible via walker Does the patient have any problems obtaining your medications?: Yes (Describe) (see CSW consult note)  Social/Family/Support Systems Patient Roles: Partner Contact Information: Arley Just Anticipated Caregiver: [phone] Ability/Limitations of Caregiver: 24/7 Caregiver Availability: 24/7 Discharge Plan Discussed with Primary Caregiver: Yes Is Caregiver In Agreement with Plan?: Yes  Goals Patient/Family Goal for Rehab: PT/OT Mod I Expected length of stay: 7-10 days Pt/Family Agrees to Admission and willing to participate: Yes Program Orientation Provided & Reviewed with Pt/Caregiver Including Roles  & Responsibilities: Yes  Decrease burden of Care through IP rehab admission: Not anticipated  Possible need for SNF placement upon discharge: Not anticipated  Patient Condition: I have reviewed medical records from [facility], spoken with CM, and patient and family member. I met with patient at the bedside for inpatient rehabilitation assessment.  Patient will benefit from ongoing PT and OT, can actively participate in 3 hours of therapy a day 5 days of the week, and can make measurable gains during the admission.  Patient will also benefit from the coordinated team approach during an Inpatient Acute Rehabilitation admission.  The patient will receive intensive therapy as well as Rehabilitation physician, nursing, social worker, and care management interventions.  Due to safety, skin/wound care, disease management, medication administration, pain management, and patient education the patient requires 24 hour a day rehabilitation nursing.  The patient is currently min A with mobility and basic ADLs.  Discharge setting and therapy post discharge at home with home health is anticipated.  Patient has agreed to participate in the Acute Inpatient Rehabilitation Program and will admit today.  Preadmission Screen Completed By:   Leita KATHEE Kleine, 12/07/2023 1:02 PM ______________________________________________________________________   Discussed status with Dr. Babs on 12/11/23 at 1039 and received approval for admission today.  Admission Coordinator:  Leita KATHEE Kleine, CCC-SLP, time 1039/Date 12/11/23   Assessment/Plan: Diagnosis: Debility after SBO and exlap Does the need for close, 24 hr/day Medical supervision in concert with the patient's rehab needs make it unreasonable for this patient to be served in a less intensive setting? Yes Co-Morbidities requiring supervision/potential complications: htn, ptsd, add, wound care, anemia Due to bladder management, bowel management, safety, skin/wound care, disease management, medication administration, pain management, and patient education, does the patient require 24 hr/day rehab nursing? Yes Does the patient require coordinated care of a physician, rehab nurse, PT, OT, and SLP to address physical and functional  deficits in the context of the above medical diagnosis(es)? Yes Addressing deficits in the following areas: balance, endurance, locomotion, strength, transferring, bowel/bladder control, bathing, dressing, feeding, grooming, toileting, and psychosocial support Can the patient actively participate in an intensive therapy program of at least 3 hrs of therapy 5 days a week? Yes The potential for patient to make measurable gains while on inpatient rehab is excellent Anticipated functional outcomes upon discharge from inpatient rehab: modified independent PT, modified independent OT, n/a SLP Estimated rehab length of stay to reach the above functional goals is: 7-10 days Anticipated discharge destination: Home 10. Overall Rehab/Functional Prognosis: excellent   [name] Signature: Arthea IVAR Gunther, [name], [facility] Physical Medicine & Rehabilitation Medical Director Rehabilitation Services 12/11/2023

## 2023-12-07 NOTE — TOC Progression Note (Signed)
 Transition of Care [facility]) - Progression Note    Patient Details  Name: Renee [name] MRN: [id number] Date of Birth: 1975-04-26  [facility] CM/SW Contact  Heather [name] Saltness, LCSW Phone Number: 12/07/2023, 10:17 AM  Clinical Narrative:    Per CIR admissions coordinator, Leita Kleine, pt's insurance authorization for CIR will be submitted today. TOC will continue to follow.    Barriers to Discharge: Continued Medical Work up, Other (must enter comment) (Having procedure today/ On the ventilator)  Expected Discharge Plan and Services In-house Referral: NA Discharge Planning Services: CM Consult Post Acute Care Choice: NA Living arrangements for the past 2 months: Single Family Home                 DME Arranged: N/A DME Agency: NA       HH Arranged: NA HH Agency: NA         Social Drivers of Health (SDOH) Interventions SDOH Screenings   Food Insecurity: Food Insecurity Present (11/25/2023)  Housing: High Risk (11/25/2023)  Transportation Needs: No Transportation Needs (11/25/2023)  Utilities: At Risk (11/25/2023)  Depression (PHQ2-9): Low Risk  (08/21/2020)  Financial Resource Strain: High Risk (11/03/2022)   Received from [facility]  Tobacco Use: Medium Risk (11/25/2023)    Readmission Risk Interventions    11/28/2023    9:46 AM  Readmission Risk Prevention Plan  Transportation Screening Complete  PCP or Specialist Appt within 5-7 Days Complete  Home Care Screening Complete  Medication Review (RN CM) Complete    Signed: Heather Saltness, MSW, LCSW Clinical Social Worker Inpatient Care Management 12/07/2023 10:18 AM

## 2023-12-07 NOTE — Progress Notes (Signed)
 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  She was admitted with multiple electrolyte abnormality, including hypocalcemia, hypokalemia, hypomagnesemia, prolonged QT.  Developed sudden abdominal pain earlier in the hospitalization and underwent emergent exploratory laparotomy with placement of wound VAC and closure of internal hernia defect.    Postoperatively she was sedated and continue on vent support, subsequently return to the OR for small bowel resection, jejunostomy tube insertion and closure of abdomen.  Required multiple vasopressors for shock.  Eventually weaned off of pressors and extubated.  Transferred to the hospitalist service 11/7.  Procedures/Events 11/1 admission for multiple electrolyte abnormalities 11/2 sudden abdominal pain, CT showed pneumatosis, underwent emergent diagnostic laparoscopy converted to exploratory laparotomy, placement of AbThera VAC (non-disposable DME), closure of internal hernia defect  11/3 CVC insertion 11/4 small bowel resection, jejunostomy tube insertion, closure of abdomen    Assessment & Plan:   Principal Problem:   Hypocalcemia Active Problems:   Hypokalemia   Hypothyroidism   Chronic pain syndrome   Hypertension   PTSD (post-traumatic stress disorder)   Morbid obesity (HCC)   Fibromyalgia   Bipolar disorder, in partial remission, most recent episode depressed (HCC)   Generalized anxiety disorder   Hyperlipidemia   Hypomagnesemia   Shock (HCC)   Malnutrition of moderate degree   Thrombocytopenia  1-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  Status post PJP - Shock resolved, electrolyte has stabilized.  Status postsurgery. -  Surgery following and managing. Still have tube feedings through jejunostomy tube, was leaking last night. Feeding on hold. Surgery will evaluate  She is trying to eat more.   Anemia Thrombocytopenia - Multifactorial, nutritional perioperative, ABLA. - Hemoglobin down to 6 on 11/11 received 1 unit of packed red blood cell.  Increase appropriately to 8.  Multiple electrolyte abnormality: Hypocalcemia, hypokalemia, hypomagnesemia, hypophosphatemia Micronutrients deficiency including copper , vitamin E, zinc  - Secondary to bariatric surgery - Continue  supplementation - Corrected calcium at 8--on oral and IV supplementation -Magnesium  replaced.   Severe hypothyroidism: - TSH 147 on admission, T3 low T4 very low.  Noncompliant with levothyroxine . - Continue with Synthroid  - Needs repeat TSH in 2 weeks   Moderate malnutrition Overweight - Nutritionist following  PTSD, anxiety, bipolar disorder, ADD, fibromyalgia -unable to sleep at night, start Trazodone.   Hypoglycemia; start D 10 while holding tube feeding   Complex social issues Questionable adrenal insufficiency - Steroids were considered on her previous [facility] admission, she needs to follow-up with endocrinologist as an outpatient. - Cortisol was at 11, 10 days ago.  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   Weight as of this encounter: 93.6 kg.   DVT prophylaxis: SCDs, resume Lovenox if hemoglobin remained stable Code Status: Full code Family Communication: Care discussed with patient Disposition Plan:  Status is: Inpatient Remains inpatient appropriate because: Management of postoperative  care    Consultants:  General surgery  Procedures:  none  Antimicrobials:    Subjective: She was evaluated by PT, she  work with them, she should be able to tolerates 3 hour of therapy at CIR.  She is trying to eat more. Feels tube feeding make her full   Objective: Vitals:   12/06/23 1258 12/06/23 1606 12/06/23 2113 12/07/23 0457  BP: 105/72 94/77 102/69 (!) 97/51  Pulse:   (!) 107 85  Resp:   17 18  Temp:   98 F (36.7 C) 98.7 F (37.1 C)  TempSrc:   Oral   SpO2:   99% 99%  Weight:      Height:        Intake/Output Summary (Last 24 hours) at 12/07/2023 0800 Last data filed at 12/07/2023 0500 Gross per 24 hour  Intake 7716.05 ml  Output 3540 ml  Net 4176.05 ml   Filed Weights   12/04/23 0434 12/05/23 0500 12/06/23 0500  Weight: 94.3 kg 94.5 kg 93.6 kg    Examination:  General exam: NAD Respiratory system: CTA Cardiovascular system: S 1, S 2 RRR  Gastrointestinal system: Abdomen is soft, J tube in placed.  Central nervous system: alert Extremities: no edema    Data Reviewed: I have personally reviewed following labs and imaging studies  CBC: Recent Labs  Lab 12/01/23 0500 12/02/23 0647 12/04/23 0258 12/05/23 0935 12/06/23 0601  WBC 4.1 5.3 7.9 10.0 10.1  NEUTROABS  --  1.7  --   --   --   HGB 7.5* 7.9* 7.0* 6.8* 8.7*  HCT 22.9* 23.7* 21.9* 20.7* 27.2*  MCV 92.3 91.2 95.2 95.0 97.5  PLT 27* 31* 88* 154 157   Basic Metabolic Panel: Recent Labs  Lab 12/03/23 0252 12/03/23 1710 12/04/23 0258 12/04/23 1801 12/05/23 0401 12/05/23 2006 12/06/23 0601  NA 140 136 134* 133* 134* 134* 133*  K 3.5 5.1 5.2* 5.2* 5.2* 4.7 4.8  CL 113* 113* 110 106 107 103 105  CO2 22 20* 21* 23 22 24 24   GLUCOSE 82 108* 101* 84 84 49* 78  BUN <5* 7 7 9 8 9 8   CREATININE 0.77 0.35* 0.35* 0.36* 0.38* 0.37* 0.36*  CALCIUM 7.5* 7.7* 7.7* 7.3* 7.2* 6.6* 6.9*  MG 1.7 1.7 1.6* 1.8 1.7  --  1.7  PHOS 2.6 1.9* 1.9* 3.3 2.5  --   --    GFR: Estimated Creatinine  Clearance: 102.1 mL/min (A) (by C-G formula based on SCr of 0.36 mg/dL (L)). Liver Function Tests: Recent Labs  Lab 12/02/23 0647 12/03/23 0252 12/04/23 0258 12/05/23 0401 12/06/23 0601  AST 23 59* 133* 43* 31  ALT 22 66* 43 31 25  ALKPHOS 154* 297* 397* 304* 225*  BILITOT 0.4 0.8 0.3 0.2 0.3  PROT 4.2* 4.3* 4.2* 4.5* 4.7*  ALBUMIN 2.3* 2.1* 1.8* 2.1* 2.1*   No results for input(s): LIPASE, AMYLASE in the last 168 hours. No results for input(s): AMMONIA in the last 168 hours. Coagulation Profile: No results for input(s): INR, PROTIME in the last 168 hours.  Cardiac Enzymes: No results for input(s): CKTOTAL, CKMB, CKMBINDEX, TROPONINI in the last 168 hours. BNP (last 3 results) No results for input(s): PROBNP in the last 8760 hours. HbA1C: No results for input(s): HGBA1C in the last 72 hours. CBG: Recent Labs  Lab 12/06/23 1642 12/06/23 2304 12/07/23 0030 12/07/23 0513 12/07/23 0714  GLUCAP 89 83 77 76 52*   Lipid Profile: No results for input(s): CHOL, HDL, LDLCALC, TRIG, CHOLHDL, LDLDIRECT in the last 72 hours. Thyroid  Function Tests: No results for input(s): TSH, T4TOTAL, FREET4, T3FREE, THYROIDAB in the last 72 hours.  Anemia Panel: No results for input(s): VITAMINB12, FOLATE, FERRITIN, TIBC, IRON, RETICCTPCT in the last 72 hours. Sepsis Labs: No results for input(s): PROCALCITON, LATICACIDVEN in the last 168 hours.  No results found for this or any previous visit (from the past 240 hours).        Radiology Studies: DG Abd 1 View Result Date: 12/07/2023 CLINICAL DATA:  Possible feeding tube dysfunction. EXAM: DG ABDOMEN 1V COMPARISON:  KUB 11/27/2023 FINDINGS: Interval removal of patient's nasogastric tube. Evidence of feeding tube over the left lower abdomen smoothly coiled with tip over the left lower quadrant. Several surgical clips over the left lower quadrant just left of the L4-5 level. Single  surgical clip over the right upper quadrant. Several suture lines throughout the abdomen compatible with multiple previous bowel surgeries. Bowel gas pattern is nonobstructive. Mild fecal retention throughout the colon most prominent over the rectum. No free peritoneal air. Remaining bony structures are unremarkable. IMPRESSION: 1. Nonobstructive bowel gas pattern with mild fecal retention throughout the colon most prominent over the rectum. 2. Evidence of feeding tube over the left lower abdomen smoothly coiled with tip over the left lower quadrant. Electronically Signed   By: Toribio Agreste M.D.   On: 12/07/2023 07:53        Scheduled Meds:  calcium citrate  200 mg of elemental calcium Oral TID WC   feeding supplement (PROSource TF20)  60 mL Per Tube Daily   feeding supplement (VITAL 1.5 CAL)  1,000 mL Per J Tube Q24H   folic acid   1 mg Oral Daily   levothyroxine   200 mcg Oral Q0600   melatonin  3 mg Oral QHS   multivitamin with minerals  1 tablet Oral Daily   pantoprazole   40 mg Oral Daily   Ensure Max Protein  11 oz Oral Daily   sodium chloride  flush  3 mL Intravenous Q12H   traZODone  50 mg Oral QHS   vitamin A   100,000 Units Oral Daily   Followed by   NOREEN ON 12/08/2023] vitamin A   50,000 Units Oral Daily   Followed by   NOREEN ON 12/22/2023] vitamin A   10,000 Units Oral Daily   Vitamin D  (Ergocalciferol )  50,000 Units Oral Q7 days   Continuous Infusions:  calcium gluconate 930 mg of elemental calcium in sodium chloride  0.9 % 1,000 mL infusion 0.5 mg/kg/hr of elemental calcium (12/07/23 0500)   dextrose  50 mL/hr at 12/07/23 0752     LOS: 12 days    Time spent: 35 Minutes    Unique Sillas A Franki Stemen, MD [facility]   If 7PM-7AM, please contact night-coverage [email or web address]  12/07/2023, 8:00 AM

## 2023-12-07 NOTE — Progress Notes (Signed)
 Progress Note  9 Days Post-Op  Subjective: Patient eating breakfast upon entering room. Patient more alert and engaging that previous encounters.  She reports tolerating regular diet. Has been working to increase her caloric intake. Reports that she ate breakfast, lunch, and dinner yesterday. Understands importance of increasing intake. Has had minimal nausea. Denies vomiting.   Had BM yesterday. Reports flatulence.   Had some leaking around her Gastrostomy/Enterostomy Jejunostomy 16 Fr. LUQ.  ROS  All negative with the exception of above.  Objective: Vital signs in last 24 hours: Temp:  [98 F (36.7 C)-98.7 F (37.1 C)] 98.7 F (37.1 C) (11/13 0457) Pulse Rate:  [85-107] 85 (11/13 0457) Resp:  [17-18] 18 (11/13 0457) BP: (94-105)/(51-77) 97/51 (11/13 0457) SpO2:  [99 %] 99 % (11/13 0457) Last BM Date : 12/05/23  Intake/Output from previous day: 11/12 0701 - 11/13 0700 In: 7776.1 [I.V.:2350.2; NG/GT:5305.8] Out: 3540 [Urine:3500; Drains:40] Intake/Output this shift: Total I/O In: -  Out: 800 [Urine:800]  PE: General: Pleasant female who is laying in bed eating breakfast in NAD. HEENT: Head is normocephalic, atraumatic.   Heart: HR normal during encounter. Lungs: Respiratory effort nonlabored. Abd: Soft, NT, ND. Dressing of abdominal wound clean and dry. Examined wound on 11/11 with nursing staff during dressing change. Refer to media. No rebound tenderness or guarding. Gastrostomy/Enterostomy Jejunostomy 16 Fr of LUQ with some yellowish fluid noted around site. Skin: Warm and dry.   Lab Results:  Recent Labs    12/05/23 0935 12/06/23 0601  WBC 10.0 10.1  HGB 6.8* 8.7*  HCT 20.7* 27.2*  PLT 154 157   BMET Recent Labs    12/05/23 2006 12/06/23 0601  NA 134* 133*  K 4.7 4.8  CL 103 105  CO2 24 24  GLUCOSE 49* 78  BUN 9 8  CREATININE 0.37* 0.36*  CALCIUM 6.6* 6.9*   PT/INR No results for input(s): LABPROT, INR in the last 72 hours. CMP      Component Value Date/Time   NA 133 (L) 12/06/2023 0601   NA 137 04/22/2019 1634   K 4.8 12/06/2023 0601   CL 105 12/06/2023 0601   CO2 24 12/06/2023 0601   GLUCOSE 78 12/06/2023 0601   BUN 8 12/06/2023 0601   BUN 8 04/22/2019 1634   CREATININE 0.36 (L) 12/06/2023 0601   CREATININE 0.73 08/02/2016 1237   CALCIUM 6.9 (L) 12/06/2023 0601   CALCIUM INTEDT 11/25/2023 1529   PROT 4.7 (L) 12/06/2023 0601   PROT 5.4 (L) 04/22/2019 1634   ALBUMIN 2.1 (L) 12/06/2023 0601   ALBUMIN 3.0 (L) 04/22/2019 1634   AST 31 12/06/2023 0601   ALT 25 12/06/2023 0601   ALKPHOS 225 (H) 12/06/2023 0601   BILITOT 0.3 12/06/2023 0601   BILITOT 0.3 04/22/2019 1634   GFRNONAA >60 12/06/2023 0601   GFRAA 80 04/22/2019 1634   Lipase     Component Value Date/Time   LIPASE 82 (H) 03/08/2022 1940       Studies/Results: DG Abd 1 View Result Date: 12/07/2023 CLINICAL DATA:  Possible feeding tube dysfunction. EXAM: DG ABDOMEN 1V COMPARISON:  KUB 11/27/2023 FINDINGS: Interval removal of patient's nasogastric tube. Evidence of feeding tube over the left lower abdomen smoothly coiled with tip over the left lower quadrant. Several surgical clips over the left lower quadrant just left of the L4-5 level. Single surgical clip over the right upper quadrant. Several suture lines throughout the abdomen compatible with multiple previous bowel surgeries. Bowel gas pattern is nonobstructive. Mild fecal  retention throughout the colon most prominent over the rectum. No free peritoneal air. Remaining bony structures are unremarkable. IMPRESSION: 1. Nonobstructive bowel gas pattern with mild fecal retention throughout the colon most prominent over the rectum. 2. Evidence of feeding tube over the left lower abdomen smoothly coiled with tip over the left lower quadrant. Electronically Signed   By: Toribio Agreste M.D.   On: 12/07/2023 07:53    Anti-infectives: Anti-infectives (From admission, onward)    Start     Dose/Rate Route  Frequency Ordered Stop   11/26/23 2300  cefTRIAXone  (ROCEPHIN ) 2 g in sodium chloride  0.9 % 100 mL IVPB        2 g 200 mL/hr over 30 Minutes Intravenous Every 24 hours 11/26/23 2210 12/01/23 0028   11/26/23 2300  metroNIDAZOLE  (FLAGYL ) IVPB 500 mg        500 mg 100 mL/hr over 60 Minutes Intravenous Every 12 hours 11/26/23 2210 11/30/23 2347        Assessment/Plan POD : LAPAROTOMY, EXPLORATORY (N/A) - Exploratory Laparotomy, Possible Bowel Resection EXCISION, SMALL INTESTINE WITH ANASTOMOSIS SECONDARY CLOSURE OF WOUND AND J TUBE CREATION with Dr. Stevie 11/4. -Afebrile. -CBC from 11/12: WBC 10.1 and HGB 8.7 from 6.8 after receiving blood products; Continue to monitor HGB. -Tolerated regular diet. Emphasized importance of caloric intake.  -Patient eating breakfast. Will come back shortly to help troubleshoot for leaking around Jtube.  -Will continue to follow.   FEN: Regular; Advancement of j tube feeds q6h to goal was 11/9. VTE: None currently ID: None.     LOS: 12 days   I reviewed hospitalist notes, specialist notes, nursing notes, last 24 h vitals and pain scores, last 48 h intake and output, last 24 h labs and trends, and last 24 h imaging results.   Marjorie Carlyon Favre, [facility] 12/07/2023, 10:24 AM Please see Amion for pager number during day hours 7:00am-4:30pm

## 2023-12-07 NOTE — Progress Notes (Signed)
 Patient leaking from J tube pretty signifcantly. It looks like partially digested tube feeding. Dressing changed multiple times because of significant saturation over the course of a couple hours (dressing changed 4 times with gauze overtop). Stomach contents went through dressing, gown, and sheets. This is new as compared to earlier in the shift. Tube feeds stopped. [facility] NP made aware, monitored drainage after TF stopped. Minimal drainage noted. Will continue to hold and pass off to day time team/ surgery per [facility] NP.

## 2023-12-07 NOTE — Plan of Care (Signed)

## 2023-12-07 NOTE — Progress Notes (Signed)
 Mobility Specialist Progress Note:   12/07/23 1355  Mobility  Activity  (Bed Exercises)  Level of Assistance Independent  Assistive Device None  Range of Motion/Exercises Active;All extremities  Activity Response Tolerated well  Mobility Referral Yes  Mobility visit 1 Mobility  Mobility Specialist Start Time (ACUTE ONLY) 1311  Mobility Specialist Stop Time (ACUTE ONLY) 1334  Mobility Specialist Time Calculation (min) (ACUTE ONLY) 23 min   Pt was received in bed and agreed to bed mobility. Seated BLE Exercises:  1) Heel Raise: 1 x 10 (5 each leg)  3) Ankle Pumps (Calf Extension): 1 x 5 each leg  4) Hip Adduction (pillow squeezes): 1 x 10 Seated BUE Exercises:  1) Arm Raises: 2 x 5 each arm  Returned to bed with all needs met. Call bell in reach.  [employer] - Mobility Specialist

## 2023-12-07 NOTE — Progress Notes (Signed)
 Inpatient Rehab Admissions Coordinator:   I met with  Pt. To discuss potential CIR admit. She is interested and family can do 24/7 . I will send case to insurance today.   Leita Kleine, MS, CCC-SLP Rehab Admissions Coordinator  [phone] (celll) [phone] (office)

## 2023-12-07 NOTE — Progress Notes (Signed)
 Nutrition Follow-up  DOCUMENTATION CODES:   Non-severe (moderate) malnutrition in context of chronic illness  INTERVENTION:  - Regular diet. - Ensure Max po BID, each supplement provides 150 kcal and 30 grams of protein.    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  - Vitamin B12: WNL - Thiamine : WNL - Vitamin K WNL - Iron: WNL - CRP: 3.4 (HIGH)   - Supplementation for repletion of deficiencies as below: - Vitamin D : < 4.2 (LOW) -  50,000 units weekly x8 weeks then re-check - Vitamin A : 2.6 (LOW)  100,000 international units daily x3 days (completed) followed by 50,000 international units daily x14 days followed by 10,000 international units daily x14 days then re-check - Started multivitamin with minerals daily. 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 *ongoing  GOAL:   Patient will meet greater than or equal to 90% of their needs *on TF and oral diet  MONITOR:   Vent status, Labs, Weight trends  REASON FOR ASSESSMENT:   Consult Assessment of nutrition requirement/status (severe malnutrition; history of gastric bypass convered to duodenal switch and has not been taking vitamins)  ASSESSMENT:   48 y.o. 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 Admitted for low electrolytes and low calcium.  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 11/5 Trickle TF (55mL/hr) initiated via J-tube; Patient self extubated overnight 11/6 CLD 11/7 Bariatric FLD 11/10 Carb Modified diet 11/13 leaking around J-tube   Patient had leaking around J-tube last night so TF held.  Met with patient at bedside who reports J-tube was removed. However, discussed with RN and they report surgery assessed tube and it is still in but they do not want to put anything through it other than water at this time. Surgery has now discontinued tube feeds and plan to encourage oral intake.   Patient reports she ate all 3 meals yesterday. Finishing up breakfast at time of visit. She reports her appetite has been fair and she has been doing her best to eat what she can. Also drinking Ensure Max.   Discussed importance of continuing to work on increasing oral intake, especially now that not getting any tube feeds. Patient endorsed understanding. She is very motivated to get stronger and do well.   Also reviewed vitamin/mineral deficiencies again and dicussed will be adding on further supplementation to correct these  deficiencies. Patient agreeable.   Plan for patient to discharge to CIR.   Admit weight: 155# -> 142# Current weight: 206# (suspect elevated d/t fluid) I&O's: +29L since admit + for edema   Medications reviewed and include: 1mg  folic acid , MVI, 50,000 units vitamin D  weekly, 500,000 units vitamin A  x14 days,  Protonix , D10 @ 99mL/hr (provides 408 kcals over 24 hours)   Labs reviewed:  No BMP today   Vitamins/Minerals: Vitamin B12: 570 (WNL) Thiamine : WNL Vitamin K: WNL Iron: WNL CRP: 3.4 (HIGH) Vitamin D : < 4.2 (LOW)  Vitamin E: 3.0 (LOW)  Zinc : 20 (LOW)  Copper : 29 (LOW)  Selenium: 52 (LOW)  Vitamin A : 2.6 (LOW) Vitamin C: 0.3 (LOW)  Diet Order:   Diet Order             Diet regular Room service appropriate? Yes; Fluid consistency: Thin  Diet effective now                   EDUCATION NEEDS:  Not appropriate for education at this time  Skin:  Skin Assessment: Skin Integrity Issues: Skin Integrity Issues:: Incisions Incisions: Abdomen  Last BM:  11/11  Height:  Ht Readings from Last 1 Encounters:  11/28/23 5' 7 (1.702 m)   Weight:  Wt Readings from Last 1 Encounters:  12/06/23 93.6 kg   Ideal Body Weight:  61.36 kg  BMI:  Body mass index is 32.33 kg/m.  Estimated Nutritional Needs:  Kcal:  1800-2100 kcals Protein:  90-115 grams Fluid:  >/= 1.8L    Renee [name] Ned RD, LDN Contact via Secure Chat.

## 2023-12-07 NOTE — Progress Notes (Signed)
 I provided follow up support to Cassanda who was tearful at time of visit.  She is concerned about her dog and worries that he is not being cared for well and that she will not be able to be reunited with him after her hospitilization.  She requested prayer, which I gladly provided.  She also requested follow support.  We will attempt to follow up tomorrow, but please also page as needs arise.

## 2023-12-08 ENCOUNTER — Encounter (HOSPITAL_COMMUNITY): Payer: Self-pay | Admitting: Internal Medicine

## 2023-12-08 DIAGNOSIS — F4310 Post-traumatic stress disorder, unspecified: Secondary | ICD-10-CM

## 2023-12-08 DIAGNOSIS — F332 Major depressive disorder, recurrent severe without psychotic features: Secondary | ICD-10-CM

## 2023-12-08 LAB — CBC
HCT: 25.8 % — ABNORMAL LOW (ref 36.0–46.0)
Hemoglobin: 8.1 g/dL — ABNORMAL LOW (ref 12.0–15.0)
MCH: 30.5 pg (ref 26.0–34.0)
MCHC: 31.4 g/dL (ref 30.0–36.0)
MCV: 97 fL (ref 80.0–100.0)
Platelets: 290 K/uL (ref 150–400)
RBC: 2.66 MIL/uL — ABNORMAL LOW (ref 3.87–5.11)
RDW: 18.8 % — ABNORMAL HIGH (ref 11.5–15.5)
WBC: 7.1 K/uL (ref 4.0–10.5)
nRBC: 0 % (ref 0.0–0.2)

## 2023-12-08 LAB — GLUCOSE, CAPILLARY
Glucose-Capillary: 112 mg/dL — ABNORMAL HIGH (ref 70–99)
Glucose-Capillary: 200 mg/dL — ABNORMAL HIGH (ref 70–99)
Glucose-Capillary: 65 mg/dL — ABNORMAL LOW (ref 70–99)
Glucose-Capillary: 67 mg/dL — ABNORMAL LOW (ref 70–99)
Glucose-Capillary: 79 mg/dL (ref 70–99)
Glucose-Capillary: 84 mg/dL (ref 70–99)
Glucose-Capillary: 91 mg/dL (ref 70–99)
Glucose-Capillary: 95 mg/dL (ref 70–99)

## 2023-12-08 LAB — BASIC METABOLIC PANEL WITH GFR
Anion gap: 5 (ref 5–15)
BUN: 8 mg/dL (ref 6–20)
CO2: 26 mmol/L (ref 22–32)
Calcium: 7.7 mg/dL — ABNORMAL LOW (ref 8.9–10.3)
Chloride: 102 mmol/L (ref 98–111)
Creatinine, Ser: 0.46 mg/dL (ref 0.44–1.00)
GFR, Estimated: >60 mL/min (ref >60)
Glucose, Bld: 80 mg/dL (ref 70–99)
Potassium: 5 mmol/L (ref 3.5–5.1)
Sodium: 133 mmol/L — ABNORMAL LOW (ref 135–145)

## 2023-12-08 LAB — MAGNESIUM: Magnesium: 1.8 mg/dL (ref 1.7–2.4)

## 2023-12-08 LAB — PHOSPHORUS: Phosphorus: 2.5 mg/dL (ref 2.5–4.6)

## 2023-12-08 MED ORDER — PAROXETINE HCL 10 MG PO TABS
10.0000 mg | ORAL_TABLET | Freq: Every day | ORAL | Status: DC
  Administered 2023-12-08 – 2023-12-11 (×4): 10 mg via ORAL
  Filled 2023-12-08 (×4): qty 1

## 2023-12-08 MED ORDER — MAGNESIUM SULFATE 2 GM/50ML IV SOLN
2.0000 g | Freq: Once | INTRAVENOUS | Status: AC
  Administered 2023-12-08: 2 g via INTRAVENOUS
  Filled 2023-12-08: qty 50
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
## 2023-12-08 NOTE — Progress Notes (Signed)
 Pt abd wound found with dressing edges lifted and bedside tissues stuffed into wound. Wet gauze was no longer on the wound bed. Wound irrigated and redressed. Pt educated on wound cleanliness. Pt states she was nervous with her fiance around and doesn't remember putting the tissues there but must have.

## 2023-12-08 NOTE — Progress Notes (Signed)
 Physical Therapy Treatment Patient Details Name: Renee [name] MRN: [id number] DOB: [birth year] Today's Date: 12/08/2023   History of Present Illness Patient is a 48 y/o female admitted 11/25/23 with diarrhea x 7 days and concern for electrolyte deficiency.  Developed severe abdominal pain and CT concern for SBO vs. Ischemic bowel.  Underwent emergent laparotomy 11/26/23 for hernia repair and left with open abdomen, on vent with pressor support then return to OR 11/28/23 for SBR, J tube placement and abdomen closure.  PMH positive for bipolar d/o, ADD, chronic pain, fibromyalgia, HTN, migraines, OSA, DDD, (cervical), polyarthralgia, Herpes simplex, and reports bariatric surgery in 2021.    PT Comments  Pt is progressing well with mobility, she ambulated 91' with RW from bed to toilet with min A to navigate obstacles and verbal cues for safe proximity to walker.  Treatment session limited by pt needed extended time on toilet.     If plan is discharge home, recommend the following: A little help with bathing/dressing/bathroom;Assist for transportation;Help with stairs or ramp for entrance;A little help with walking and/or transfers   Can travel by private vehicle        Equipment Recommendations  Other (comment) (TBD at next venue)    Recommendations for Other Services Rehab consult     Precautions / Restrictions Precautions Precautions: Fall Recall of Precautions/Restrictions: Intact Precaution/Restrictions Comments: J-tube, abdominal incision Restrictions Weight Bearing Restrictions Per Provider Order: No     Mobility  Bed Mobility Overal bed mobility: Needs Assistance Bed Mobility: Rolling, Sidelying to Sit Rolling: Supervision Sidelying to sit: Supervision       General bed mobility comments: verbal cues for technique for log roll, HOB up ~30*    Transfers Overall transfer level: Needs assistance Equipment used: Rolling walker (2 wheels) Transfers: Sit to/from Stand Sit  to Stand: Contact guard assist, From elevated surface           General transfer comment: VCs hand placement    Ambulation/Gait Ambulation/Gait assistance: Min assist Gait Distance (Feet): 14 Feet Assistive device: Rolling walker (2 wheels) Gait Pattern/deviations: Step-to pattern, Decreased stride length Gait velocity: decr     General Gait Details: VCs for proximity to RW, min A to navigate obstacles   Stairs             Wheelchair Mobility     Tilt Bed    Modified Rankin (Stroke Patients Only)       Balance Overall balance assessment: Needs assistance Sitting-balance support: Feet supported, No upper extremity supported Sitting balance-Leahy Scale: Good     Standing balance support: Bilateral upper extremity supported, During functional activity, Reliant on assistive device for balance Standing balance-Leahy Scale: Fair                              Hotel Manager: No apparent difficulties  Cognition Arousal: Alert Behavior During Therapy: WFL for tasks assessed/performed   PT - Cognitive impairments: No apparent impairments                         Following commands: Impaired Following commands impaired: Follows one step commands with increased time    Cueing Cueing Techniques: Verbal cues, Gestural cues  Exercises      General Comments        Pertinent Vitals/Pain Pain Assessment Faces Pain Scale: Hurts a little bit Pain Location: abdomen Pain Descriptors / Indicators: Operative site guarding Pain Intervention(s): Limited  activity within patient's tolerance, Monitored during session, Premedicated before session    Home Living                          Prior Function            PT Goals (current goals can now be found in the care plan section) Acute Rehab PT Goals Patient Stated Goal: get stronger, return to independent, be with her dog PT Goal Formulation: With  patient Time For Goal Achievement: 12/15/23 Potential to Achieve Goals: Fair Progress towards PT goals: Progressing toward goals    Frequency    Min 3X/week      PT Plan      Co-evaluation              AM-PAC PT 6 Clicks Mobility   Outcome Measure  Help needed turning from your back to your side while in a flat bed without using bedrails?: A Little Help needed moving from lying on your back to sitting on the side of a flat bed without using bedrails?: A Little Help needed moving to and from a bed to a chair (including a wheelchair)?: A Little Help needed standing up from a chair using your arms (e.g., wheelchair or bedside chair)?: A Little Help needed to walk in hospital room?: A Little Help needed climbing 3-5 steps with a railing? : A Lot 6 Click Score: 17    End of Session Equipment Utilized During Treatment: Gait belt Activity Tolerance: Patient tolerated treatment well Patient left: with call bell/phone within reach;Other (comment) (on commode, RN notified) Nurse Communication: Mobility status PT Visit Diagnosis: Other abnormalities of gait and mobility (R26.89);Muscle weakness (generalized) (M62.81);Pain     Time: 8581-8562 PT Time Calculation (min) (ACUTE ONLY): 19 min  Charges:    $Gait Training: 8-22 mins PT General Charges $$ ACUTE PT VISIT: 1 Visit                     Sylvan Delon Copp PT 12/08/2023  Acute Rehabilitation Services  Office [phone]

## 2023-12-08 NOTE — Progress Notes (Signed)
 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  She was admitted with multiple electrolyte abnormality, including hypocalcemia, hypokalemia, hypomagnesemia, prolonged QT.  Developed sudden abdominal pain earlier in the hospitalization and underwent emergent exploratory laparotomy with placement of wound VAC and closure of internal hernia defect.    Postoperatively she was sedated and continue on vent support, subsequently return to the OR for small bowel resection, jejunostomy tube insertion and closure of abdomen.  Required multiple vasopressors for shock.  Eventually weaned off of pressors and extubated.  Transferred to the hospitalist service 11/7.  Procedures/Events 11/1 admission for multiple electrolyte abnormalities 11/2 sudden abdominal pain, CT showed pneumatosis, underwent emergent diagnostic laparoscopy converted to exploratory laparotomy, placement of AbThera VAC (non-disposable DME), closure of internal hernia defect  11/3 CVC insertion 11/4 small bowel resection, jejunostomy tube insertion, closure of abdomen    Assessment & Plan:   Principal Problem:   Hypocalcemia Active Problems:   Hypokalemia   Hypothyroidism   Chronic pain syndrome   Hypertension   PTSD (post-traumatic stress disorder)   Morbid obesity (HCC)   Fibromyalgia   Bipolar disorder, in partial remission, most recent episode depressed (HCC)   Generalized anxiety disorder   Hyperlipidemia   Hypomagnesemia   Shock (HCC)   Malnutrition of moderate degree   Thrombocytopenia  1-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  Status post PJP - Shock resolved, electrolyte has stabilized.  Status postsurgery. -  Surgery following and managing. -J tube leaking.  She is eating more.  Tube feeding discontinue.  Stable for CIR>   Anemia Thrombocytopenia - Multifactorial, nutritional perioperative, ABLA. - Hemoglobin down to 6 on 11/11 received 1 unit of packed red blood cell.  Increase appropriately to 8.  Multiple electrolyte abnormality: Hypocalcemia, hypokalemia, hypomagnesemia, hypophosphatemia Micronutrients deficiency including copper , vitamin E, zinc  - Secondary to bariatric surgery - Continue  supplementation - Corrected calcium at 8--on oral  supplementation. Discontinue IV calcium supplementation.  -Magnesium  replaced.   Severe hypothyroidism: - TSH 147 on admission, T3 low T4 very low.  Noncompliant with levothyroxine . - Continue with Synthroid  - Needs repeat TSH in 2 weeks   Moderate malnutrition Overweight - Nutritionist following  PTSD, anxiety, bipolar disorder, ADD, fibromyalgia -unable to sleep at night, started Trazodone.  -Psych consulted, started on paxil/  Hypoglycemia; Currently on D 10, had several low CBG yesterday.    Complex social issues Questionable adrenal insufficiency - Steroids were considered on her previous [facility] admission, she needs to follow-up with endocrinologist as an outpatient. - Cortisol was at 11, 10 days ago.  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   Weight as of this encounter: 93.6 kg.   DVT prophylaxis: SCDs, resume Lovenox if hemoglobin remained stable Code Status: Full code Family Communication: Care discussed with patient Disposition Plan:  Status is: Inpatient Remains inpatient appropriate  because: for CIR   Consultants:  General surgery  Procedures:  none  Antimicrobials:    Subjective: She is alert, she has  been anxious and depressed.  She report she has good appetite  and is eating.   Objective: Vitals:   12/07/23 0457 12/07/23 1458 12/07/23 1925 12/08/23 0351  BP: (!) 97/51 104/74 124/86 (!) 112/98  Pulse: 85 98 98 95  Resp: 18 18 15 15   Temp: 98.7 F (37.1 C) 98.6 F (37 C) 98.4 F (36.9 C) 98.3 F (36.8 C)  TempSrc:   Oral   SpO2: 99% 99% 100% 100%  Weight:      Height:        Intake/Output Summary (Last 24 hours) at 12/08/2023 0737 Last data filed at 12/08/2023 0010 Gross per 24 hour  Intake 3 ml  Output 3800 ml  Net -3797 ml   Filed Weights   12/04/23 0434 12/05/23 0500 12/06/23 0500  Weight: 94.3 kg 94.5 kg 93.6 kg    Examination:  General exam: NAD Respiratory system: CTA Cardiovascular system: S 1, S 2 RRR Gastrointestinal system: BS present, open wound with dressing, J tube in placed.  Central nervous system: alert Extremities: no edema    Data Reviewed: I have personally reviewed following labs and imaging studies  CBC: Recent Labs  Lab 12/02/23 0647 12/04/23 0258 12/05/23 0935 12/06/23 0601 12/08/23 0602  WBC 5.3 7.9 10.0 10.1 7.1  NEUTROABS 1.7  --   --   --   --   HGB 7.9* 7.0* 6.8* 8.7* 8.1*  HCT 23.7* 21.9* 20.7* 27.2* 25.8*  MCV 91.2 95.2 95.0 97.5 97.0  PLT 31* 88* 154 157 290   Basic Metabolic Panel: Recent Labs  Lab 12/03/23 1710 12/04/23 0258 12/04/23 1801 12/05/23 0401 12/05/23 2006 12/06/23 0601 12/08/23 0602  NA 136 134* 133* 134* 134* 133* 133*  K 5.1 5.2* 5.2* 5.2* 4.7 4.8 5.0  CL 113* 110 106 107 103 105 102  CO2 20* 21* 23 22 24 24 26   GLUCOSE 108* 101* 84 84 49* 78 80  BUN 7 7 9 8 9 8 8   CREATININE 0.35* 0.35* 0.36* 0.38* 0.37* 0.36* 0.46  CALCIUM 7.7* 7.7* 7.3* 7.2* 6.6* 6.9* 7.7*  MG 1.7 1.6* 1.8 1.7  --  1.7 1.8  PHOS 1.9* 1.9* 3.3 2.5  --   --  2.5   GFR: Estimated  Creatinine Clearance: 102.1 mL/min (by C-G formula based on SCr of 0.46 mg/dL). Liver Function Tests: Recent Labs  Lab 12/02/23 0647 12/03/23 0252 12/04/23 0258 12/05/23 0401 12/06/23 0601  AST 23 59* 133* 43* 31  ALT 22 66* 43 31 25  ALKPHOS 154* 297* 397* 304* 225*  BILITOT 0.4 0.8 0.3 0.2 0.3  PROT 4.2* 4.3* 4.2* 4.5* 4.7*  ALBUMIN 2.3* 2.1* 1.8* 2.1* 2.1*   No results for input(s): LIPASE, AMYLASE in the last 168 hours. No results for input(s): AMMONIA in the last 168 hours. Coagulation Profile: No results for input(s): INR, PROTIME in the last 168 hours.  Cardiac Enzymes: No results for input(s): CKTOTAL, CKMB, CKMBINDEX, TROPONINI in the last 168 hours. BNP (last 3 results) No results for input(s): PROBNP in the last 8760 hours. HbA1C: No results for input(s): HGBA1C in the last 72 hours. CBG: Recent Labs  Lab 12/07/23 2048 12/07/23 2215 12/08/23 0006 12/08/23 0345 12/08/23 0421  GLUCAP 73 87 95 65* 91   Lipid Profile: No results for input(s): CHOL, HDL, LDLCALC, TRIG, CHOLHDL, LDLDIRECT in the last 72 hours. Thyroid  Function Tests: No results for input(s): TSH, T4TOTAL, FREET4, T3FREE, THYROIDAB in the last 72 hours. Anemia Panel: No results for input(s): VITAMINB12,  FOLATE, FERRITIN, TIBC, IRON, RETICCTPCT in the last 72 hours. Sepsis Labs: No results for input(s): PROCALCITON, LATICACIDVEN in the last 168 hours.  No results found for this or any previous visit (from the past 240 hours).        Radiology Studies: DG Abd 1 View Result Date: 12/07/2023 CLINICAL DATA:  Possible feeding tube dysfunction. EXAM: DG ABDOMEN 1V COMPARISON:  KUB 11/27/2023 FINDINGS: Interval removal of patient's nasogastric tube. Evidence of feeding tube over the left lower abdomen smoothly coiled with tip over the left lower quadrant. Several surgical clips over the left lower quadrant just left of the L4-5 level.  Single surgical clip over the right upper quadrant. Several suture lines throughout the abdomen compatible with multiple previous bowel surgeries. Bowel gas pattern is nonobstructive. Mild fecal retention throughout the colon most prominent over the rectum. No free peritoneal air. Remaining bony structures are unremarkable. IMPRESSION: 1. Nonobstructive bowel gas pattern with mild fecal retention throughout the colon most prominent over the rectum. 2. Evidence of feeding tube over the left lower abdomen smoothly coiled with tip over the left lower quadrant. Electronically Signed   By: Toribio Agreste M.D.   On: 12/07/2023 07:53        Scheduled Meds:  ascorbic acid  500 mg Oral BID   Followed by   NOREEN ON 12/10/2023] ascorbic acid  500 mg Oral Daily   calcium citrate  200 mg of elemental calcium Oral TID WC   copper   4 mg Oral BID   Followed by   NOREEN ON 12/15/2023] copper   2 mg Oral TID   Followed by   NOREEN ON 12/22/2023] copper   4 mg Oral Daily   Followed by   NOREEN ON 12/29/2023] copper   2 mg Oral Daily   folic acid   1 mg Oral Daily   levothyroxine   200 mcg Oral Q0600   melatonin  3 mg Oral QHS   multivitamin with minerals  1 tablet Oral Daily   pantoprazole   40 mg Oral Daily   Ensure Max Protein  11 oz Oral BID   selenium  100 mcg Oral Daily   sodium chloride  flush  3 mL Intravenous Q12H   traZODone  50 mg Oral QHS   vitamin A   50,000 Units Oral Daily   Followed by   NOREEN ON 12/22/2023] vitamin A   10,000 Units Oral Daily   [START ON 12/11/2023] Vitamin D  (Ergocalciferol )  50,000 Units Oral Q7 days   vitamin E  400 Units Oral Daily   zinc  sulfate (50mg  elemental zinc )  220 mg Oral Daily   Continuous Infusions:  calcium gluconate 930 mg of elemental calcium in sodium chloride  0.9 % 1,000 mL infusion 0.5 mg/kg/hr of elemental calcium (12/07/23 0500)   dextrose  100 mL/hr at 12/07/23 2141     LOS: 13 days    Time spent: 35 Minutes    Renee Fizer A Hipolito Martinezlopez, MD [facility]   If 7PM-7AM, please contact night-coverage [email or web address]  12/08/2023, 7:37 AM

## 2023-12-08 NOTE — Progress Notes (Signed)
 Progress Note  10 Days Post-Op  Subjective: Upon entering room, patient reports that she has had a bowel movement and is needing assistance.   She is tolerating diet and has been conscious of caloric intake. Denies nausea/vomiting. Pain manageable at this time.  ROS  All negative with the exception of above  Objective: Vital signs in last 24 hours: Temp:  [98.3 F (36.8 C)-98.6 F (37 C)] 98.3 F (36.8 C) (11/14 0351) Pulse Rate:  [95-98] 95 (11/14 0351) Resp:  [15-18] 15 (11/14 0351) BP: (104-124)/(74-98) 112/98 (11/14 0351) SpO2:  [99 %-100 %] 100 % (11/14 0351) Last BM Date : 12/08/23  Intake/Output from previous day: 11/13 0701 - 11/14 0700 In: 3 [I.V.:3] Out: 3800 [Urine:3800] Intake/Output this shift: Total I/O In: -  Out: 1200 [Urine:1200]  PE: General: Pleasant female who is laying in bed eating breakfast in NAD. HEENT: Head is normocephalic, atraumatic.   Heart: HR normal during encounter. Lungs: Respiratory effort nonlabored. Abd: Abdominal exam to be completed this afternoon as had bowel movement and being assisted with cleaning. Skin: Warm and dry.   Lab Results:  Recent Labs    12/06/23 0601 12/08/23 0602  WBC 10.1 7.1  HGB 8.7* 8.1*  HCT 27.2* 25.8*  PLT 157 290   BMET Recent Labs    12/06/23 0601 12/08/23 0602  NA 133* 133*  K 4.8 5.0  CL 105 102  CO2 24 26  GLUCOSE 78 80  BUN 8 8  CREATININE 0.36* 0.46  CALCIUM 6.9* 7.7*   PT/INR No results for input(s): LABPROT, INR in the last 72 hours. CMP     Component Value Date/Time   NA 133 (L) 12/08/2023 0602   NA 137 04/22/2019 1634   K 5.0 12/08/2023 0602   CL 102 12/08/2023 0602   CO2 26 12/08/2023 0602   GLUCOSE 80 12/08/2023 0602   BUN 8 12/08/2023 0602   BUN 8 04/22/2019 1634   CREATININE 0.46 12/08/2023 0602   CREATININE 0.73 08/02/2016 1237   CALCIUM 7.7 (L) 12/08/2023 0602   CALCIUM INTEDT 11/25/2023 1529   PROT 4.7 (L) 12/06/2023 0601   PROT 5.4 (L) 04/22/2019  1634   ALBUMIN 2.1 (L) 12/06/2023 0601   ALBUMIN 3.0 (L) 04/22/2019 1634   AST 31 12/06/2023 0601   ALT 25 12/06/2023 0601   ALKPHOS 225 (H) 12/06/2023 0601   BILITOT 0.3 12/06/2023 0601   BILITOT 0.3 04/22/2019 1634   GFRNONAA >60 12/08/2023 0602   GFRAA 80 04/22/2019 1634   Lipase     Component Value Date/Time   LIPASE 82 (H) 03/08/2022 1940       Studies/Results: DG Abd 1 View Result Date: 12/07/2023 CLINICAL DATA:  Possible feeding tube dysfunction. EXAM: DG ABDOMEN 1V COMPARISON:  KUB 11/27/2023 FINDINGS: Interval removal of patient's nasogastric tube. Evidence of feeding tube over the left lower abdomen smoothly coiled with tip over the left lower quadrant. Several surgical clips over the left lower quadrant just left of the L4-5 level. Single surgical clip over the right upper quadrant. Several suture lines throughout the abdomen compatible with multiple previous bowel surgeries. Bowel gas pattern is nonobstructive. Mild fecal retention throughout the colon most prominent over the rectum. No free peritoneal air. Remaining bony structures are unremarkable. IMPRESSION: 1. Nonobstructive bowel gas pattern with mild fecal retention throughout the colon most prominent over the rectum. 2. Evidence of feeding tube over the left lower abdomen smoothly coiled with tip over the left lower quadrant. Electronically Signed  By: Toribio Agreste M.D.   On: 12/07/2023 07:53    Anti-infectives: Anti-infectives (From admission, onward)    Start     Dose/Rate Route Frequency Ordered Stop   11/26/23 2300  cefTRIAXone  (ROCEPHIN ) 2 g in sodium chloride  0.9 % 100 mL IVPB        2 g 200 mL/hr over 30 Minutes Intravenous Every 24 hours 11/26/23 2210 12/01/23 0028   11/26/23 2300  metroNIDAZOLE  (FLAGYL ) IVPB 500 mg        500 mg 100 mL/hr over 60 Minutes Intravenous Every 12 hours 11/26/23 2210 11/30/23 2347        Assessment/Plan POD 10: LAPAROTOMY, EXPLORATORY (N/A) - Exploratory Laparotomy,  Possible Bowel Resection EXCISION, SMALL INTESTINE WITH ANASTOMOSIS SECONDARY CLOSURE OF WOUND AND J TUBE CREATION with Dr. Stevie 11/4. -Afebrile. -WBC 7.1 and HGB 8.1. Continue to monitor HGB. -Tolerated regular diet. Continuing to be conscious of intake as TF were discontinued.  -Planning to come back to look at wound as nursing staff has concerns. -Will continue to follow.   FEN: Regular VTE: None currently ID: None.     LOS: 13 days   I reviewed nursing notes, hospitalist notes, last 24 h vitals and pain scores, last 48 h intake and output, last 24 h labs and trends, and last 24 h imaging results.   Marjorie Carlyon Favre, [facility] 12/08/2023, 1:04 PM Please see Amion for pager number during day hours 7:00am-4:30pm

## 2023-12-08 NOTE — Plan of Care (Signed)

## 2023-12-08 NOTE — Progress Notes (Addendum)
 Inpatient Rehab Admissions Coordinator:  Attempted to contact pt's fiance Arley to confirm dispo. Left a message; awaiting return call. Will continue to follow.   Insurance authorization received. Await bed availability and confirmation of dispo.   Tinnie Yvone Cohens, MS, CCC-SLP Admissions Coordinator [phone]

## 2023-12-08 NOTE — Plan of Care (Signed)
  Problem: Education: Goal: Knowledge of General Education information will improve Description: Including pain rating scale, medication(s)/side effects and non-pharmacologic comfort measures Outcome: Progressing   Problem: Health Behavior/Discharge Planning: Goal: Ability to manage health-related needs will improve Outcome: Progressing   Problem: Clinical Measurements: Goal: Ability to maintain clinical measurements within normal limits will improve Outcome: Progressing   Problem: Nutrition: Goal: Adequate nutrition will be maintained Outcome: Progressing   Problem: Pain Managment: Goal: General experience of comfort will improve and/or be controlled Outcome: Progressing   Problem: Safety: Goal: Ability to remain free from injury will improve Outcome: Progressing   Problem: Skin Integrity: Goal: Risk for impaired skin integrity will decrease Outcome: Progressing

## 2023-12-09 LAB — GLUCOSE, CAPILLARY
Glucose-Capillary: 72 mg/dL (ref 70–99)
Glucose-Capillary: 73 mg/dL (ref 70–99)
Glucose-Capillary: 74 mg/dL (ref 70–99)
Glucose-Capillary: 75 mg/dL (ref 70–99)
Glucose-Capillary: 76 mg/dL (ref 70–99)
Glucose-Capillary: 93 mg/dL (ref 70–99)
Glucose-Capillary: 94 mg/dL (ref 70–99)

## 2023-12-09 LAB — BASIC METABOLIC PANEL WITH GFR
Anion gap: 6 (ref 5–15)
BUN: 8 mg/dL (ref 6–20)
CO2: 25 mmol/L (ref 22–32)
Calcium: 7.1 mg/dL — ABNORMAL LOW (ref 8.9–10.3)
Chloride: 104 mmol/L (ref 98–111)
Creatinine, Ser: 0.37 mg/dL — ABNORMAL LOW (ref 0.44–1.00)
GFR, Estimated: >60 mL/min (ref >60)
Glucose, Bld: 67 mg/dL — ABNORMAL LOW (ref 70–99)
Potassium: 3.9 mmol/L (ref 3.5–5.1)
Sodium: 135 mmol/L (ref 135–145)

## 2023-12-09 MED ORDER — TRAZODONE HCL 50 MG PO TABS
75.0000 mg | ORAL_TABLET | Freq: Every day | ORAL | Status: DC
  Administered 2023-12-09 – 2023-12-10 (×2): 75 mg via ORAL
  Filled 2023-12-09 (×2): qty 2

## 2023-12-09 MED ORDER — FLORANEX PO PACK
1.0000 g | PACK | Freq: Three times a day (TID) | ORAL | Status: DC
  Administered 2023-12-10 – 2023-12-11 (×3): 1 g via ORAL
  Filled 2023-12-09 (×6): qty 1

## 2023-12-09 MED ORDER — BACID PO TABS: 2.0000 | ORAL_TABLET | Freq: Three times a day (TID) | ORAL | Status: DC

## 2023-12-09 NOTE — Progress Notes (Signed)
 Primary RN performed wound care change, new image uploaded in chart. J tube is still leaking. Changed dressing.

## 2023-12-09 NOTE — Progress Notes (Signed)
 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  She was admitted with multiple electrolyte abnormality, including hypocalcemia, hypokalemia, hypomagnesemia, prolonged QT.  Developed sudden abdominal pain earlier in the hospitalization and underwent emergent exploratory laparotomy with placement of wound VAC and closure of internal hernia defect.    Postoperatively she was sedated and continue on vent support, subsequently return to the OR for small bowel resection, jejunostomy tube insertion and closure of abdomen.  Required multiple vasopressors for shock.  Eventually weaned off of pressors and extubated.  Transferred to the hospitalist service 11/7.  Procedures/Events 11/1 admission for multiple electrolyte abnormalities 11/2 sudden abdominal pain, CT showed pneumatosis, underwent emergent diagnostic laparoscopy converted to exploratory laparotomy, placement of AbThera VAC (non-disposable DME), closure of internal hernia defect  11/3 CVC insertion 11/4 small bowel resection, jejunostomy tube insertion, closure of abdomen    Assessment & Plan:   Principal Problem:   Hypocalcemia Active Problems:   Hypokalemia   Hypothyroidism   Chronic pain syndrome   Hypertension   PTSD (post-traumatic stress disorder)   Morbid obesity (HCC)   Fibromyalgia   Generalized anxiety disorder   Hyperlipidemia   Hypomagnesemia   Shock (HCC)   Malnutrition of moderate degree   Thrombocytopenia   Major depressive disorder, recurrent severe without psychotic features (HCC)  1-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  Status post PJP - Shock resolved, electrolyte has stabilized.  Status  postsurgery. - Surgery following and managing. -J tube leaking.  She is eating more.  Tube feeding discontinue.  Stable for CIR>   Anemia Thrombocytopenia - Multifactorial, nutritional perioperative, ABLA. - Hemoglobin down to 6 on 11/11 received 1 unit of packed red blood cell.  Increase appropriately to 8.  Multiple electrolyte abnormality: Hypocalcemia, hypokalemia, hypomagnesemia, hypophosphatemia Micronutrients deficiency including copper , vitamin E, zinc  - Secondary to bariatric surgery - Continue  supplementation - Corrected calcium at 8--on oral  supplementation. Discontinue IV calcium supplementation.  -Magnesium  replaced.   Severe hypothyroidism: - TSH 147 on admission, T3 low T4 very low.  Noncompliant with levothyroxine . - Continue with Synthroid  - Needs repeat TSH in 2 weeks   Moderate malnutrition Overweight - Nutritionist following  PTSD, anxiety, bipolar disorder, ADD, fibromyalgia -unable to sleep at night, started Trazodone.  -Psych consulted, started on paxil/   Hypoglycemia; Currently on D 10, had several low CBG yesterday.  Monitor. Decreasing rate IV fluids.   Diarrhea;  Nurse report patient is having multiples BM today.  Plan to start florastore. Patient afebrile, denies abdominal pain.   Complex social issues Questionable adrenal insufficiency - Steroids were considered on her previous [facility] admission, she needs to follow-up with endocrinologist as an outpatient. - Cortisol was at 11, 10 days ago.  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   Weight as of this encounter: 93.6 kg.   DVT prophylaxis: SCDs, resume  Lovenox if hemoglobin remained stable Code Status: Full code Family Communication: Care discussed with patient Disposition Plan:  Status is: Inpatient  Remains inpatient appropriate because: for CIR   Consultants:  General surgery  Procedures:  none  Antimicrobials:    Subjective: She couldn't sleep lat night. Was worry about leaking J tube She is eating, didn't want to eat breakfast. She over did it yesterday   Objective: Vitals:   12/08/23 1359 12/08/23 2046 12/09/23 0326 12/09/23 1440  BP: 106/71 105/74 105/70 107/70  Pulse: 98 94 87 92  Resp: 16 16  18   Temp: 98.3 F (36.8 C) 98.8 F (37.1 C)  98.6 F (37 C)  TempSrc: Oral Oral    SpO2: 97% 100%    Weight:      Height:        Intake/Output Summary (Last 24 hours) at 12/09/2023 1557 Last data filed at 12/09/2023 0028 Gross per 24 hour  Intake 50 ml  Output --  Net 50 ml   Filed Weights   12/04/23 0434 12/05/23 0500 12/06/23 0500  Weight: 94.3 kg 94.5 kg 93.6 kg    Examination:  General exam: NAD Respiratory system: CTA Cardiovascular system: S1, S 2 RRR Gastrointestinal system:BS present, soft, nt, J tube in placed.  Central nervous system: alert Extremities: no edema    Data Reviewed: I have personally reviewed following labs and imaging studies  CBC: Recent Labs  Lab 12/04/23 0258 12/05/23 0935 12/06/23 0601 12/08/23 0602  WBC 7.9 10.0 10.1 7.1  HGB 7.0* 6.8* 8.7* 8.1*  HCT 21.9* 20.7* 27.2* 25.8*  MCV 95.2 95.0 97.5 97.0  PLT 88* 154 157 290   Basic Metabolic Panel: Recent Labs  Lab 12/03/23 1710 12/04/23 0258 12/04/23 1801 12/05/23 0401 12/05/23 2006 12/06/23 0601 12/08/23 0602 12/09/23 0819  NA 136 134* 133* 134* 134* 133* 133* 135  K 5.1 5.2* 5.2* 5.2* 4.7 4.8 5.0 3.9  CL 113* 110 106 107 103 105 102 104  CO2 20* 21* 23 22 24 24 26 25   GLUCOSE 108* 101* 84 84 49* 78 80 67*  BUN 7 7 9 8 9 8 8 8   CREATININE 0.35* 0.35* 0.36* 0.38* 0.37* 0.36* 0.46 0.37*  CALCIUM 7.7*  7.7* 7.3* 7.2* 6.6* 6.9* 7.7* 7.1*  MG 1.7 1.6* 1.8 1.7  --  1.7 1.8  --   PHOS 1.9* 1.9* 3.3 2.5  --   --  2.5  --    GFR: Estimated Creatinine Clearance: 102.1 mL/min (A) (by C-G formula based on SCr of 0.37 mg/dL (L)). Liver Function Tests: Recent Labs  Lab 12/03/23 0252 12/04/23 0258 12/05/23 0401 12/06/23 0601  AST 59* 133* 43* 31  ALT 66* 43 31 25  ALKPHOS 297* 397* 304* 225*  BILITOT 0.8 0.3 0.2 0.3  PROT 4.3* 4.2* 4.5* 4.7*  ALBUMIN 2.1* 1.8* 2.1* 2.1*   No results for input(s): LIPASE, AMYLASE in the last 168 hours. No results for input(s): AMMONIA in the last 168 hours. Coagulation Profile: No results for input(s): INR, PROTIME in the last 168 hours.  Cardiac Enzymes: No results for input(s): CKTOTAL, CKMB, CKMBINDEX, TROPONINI in the last 168 hours. BNP (last 3 results) No results for input(s): PROBNP in the last 8760 hours. HbA1C: No results for input(s): HGBA1C in the last 72 hours. CBG: Recent Labs  Lab 12/08/23 2225 12/09/23 0029 12/09/23 0359 12/09/23 0732 12/09/23 1154  GLUCAP 200* 72 93 73 75   Lipid Profile: No results for input(s): CHOL, HDL, LDLCALC, TRIG, CHOLHDL, LDLDIRECT in the last 72 hours. Thyroid  Function Tests: No results for input(s): TSH, T4TOTAL, FREET4, T3FREE, THYROIDAB in the last  72 hours. Anemia Panel: No results for input(s): VITAMINB12, FOLATE, FERRITIN, TIBC, IRON, RETICCTPCT in the last 72 hours. Sepsis Labs: No results for input(s): PROCALCITON, LATICACIDVEN in the last 168 hours.  No results found for this or any previous visit (from the past 240 hours).        Radiology Studies: No results found.       Scheduled Meds:  ascorbic acid  500 mg Oral BID   Followed by   NOREEN ON 12/10/2023] ascorbic acid  500 mg Oral Daily   calcium citrate  200 mg of elemental calcium Oral TID WC   copper   4 mg Oral BID   Followed by   NOREEN ON 12/15/2023]  copper   2 mg Oral TID   Followed by   NOREEN ON 12/22/2023] copper   4 mg Oral Daily   Followed by   NOREEN ON 12/29/2023] copper   2 mg Oral Daily   folic acid   1 mg Oral Daily   lactobacillus  1 g Oral TID WC   levothyroxine   200 mcg Oral Q0600   melatonin  3 mg Oral QHS   multivitamin with minerals  1 tablet Oral Daily   pantoprazole   40 mg Oral Daily   PARoxetine  10 mg Oral Daily   Ensure Max Protein  11 oz Oral BID   selenium  100 mcg Oral Daily   sodium chloride  flush  3 mL Intravenous Q12H   traZODone  75 mg Oral QHS   vitamin A   50,000 Units Oral Daily   Followed by   NOREEN ON 12/22/2023] vitamin A   10,000 Units Oral Daily   [START ON 12/11/2023] Vitamin D  (Ergocalciferol )  50,000 Units Oral Q7 days   vitamin E  400 Units Oral Daily   zinc  sulfate (50mg  elemental zinc )  220 mg Oral Daily   Continuous Infusions:  dextrose  50 mL/hr at 12/09/23 0027     LOS: 14 days    Time spent: 35 Minutes    Sumer Moorehouse A Bunnie Rehberg, MD [facility]   If 7PM-7AM, please contact night-coverage [email or web address]  12/09/2023, 3:57 PM

## 2023-12-09 NOTE — Progress Notes (Addendum)
 Per nightshift nurse, pt was found digging her hands in her own bed pan which was full of stool. RN cleaned her up and educated her about hygiene and infection control since patient had major surgery this admission on her abdomen. Patient is alert and oriented but almost seems delirious from lack of sleep. Nightshift nurse gave trazodone prn for sleep, but said it had no effect.  Will update team about patient's events overnight.

## 2023-12-09 NOTE — Progress Notes (Signed)
 11 Days Post-Op   Subjective/Chief Complaint: + BM Tolerating diet without difficulty No nausea or vomiting   Objective: Vital signs in last 24 hours: Temp:  [98.3 F (36.8 C)-98.8 F (37.1 C)] 98.8 F (37.1 C) (11/14 2046) Pulse Rate:  [87-98] 87 (11/15 0326) Resp:  [16] 16 (11/14 2046) BP: (105-106)/(70-74) 105/70 (11/15 0326) SpO2:  [97 %-100 %] 100 % (11/14 2046) Last BM Date : 12/08/23  Intake/Output from previous day: 11/14 0701 - 11/15 0700 In: 290 [P.O.:240; IV Piggyback:50] Out: 1200 [Urine:1200] Intake/Output this shift: No intake/output data recorded.  Abd - soft, non-tender Wound - clean, granulating with minimal fibrinous exudate  Lab Results:  Recent Labs    12/08/23 0602  WBC 7.1  HGB 8.1*  HCT 25.8*  PLT 290   BMET Recent Labs    12/08/23 0602  NA 133*  K 5.0  CL 102  CO2 26  GLUCOSE 80  BUN 8  CREATININE 0.46  CALCIUM 7.7*   Anti-infectives: Anti-infectives (From admission, onward)    Start     Dose/Rate Route Frequency Ordered Stop   11/26/23 2300  cefTRIAXone  (ROCEPHIN ) 2 g in sodium chloride  0.9 % 100 mL IVPB        2 g 200 mL/hr over 30 Minutes Intravenous Every 24 hours 11/26/23 2210 12/01/23 0028   11/26/23 2300  metroNIDAZOLE  (FLAGYL ) IVPB 500 mg        500 mg 100 mL/hr over 60 Minutes Intravenous Every 12 hours 11/26/23 2210 11/30/23 2347       Assessment/Plan: LAPAROTOMY, EXPLORATORY (N/A) - Exploratory Laparotomy 11/26/23 - Siddhanth Denk/ Wilson SMALL INTESTINE WITH ANASTOMOSIS SECONDARY CLOSURE OF WOUND AND J TUBE CREATION with Dr. Stevie 11/4. -Afebrile. -WBC 7.1 and HGB 8.1. Continue to monitor HGB. -Tolerated regular diet. Continuing to be conscious of intake as TF were discontinued.  -Progressing towards CIR placement -Will continue to follow.   FEN: Regular VTE: None currently ID: None.      LOS: 14 days    Renee [name] 12/09/2023

## 2023-12-10 LAB — CBC
HCT: 25.1 % — ABNORMAL LOW (ref 36.0–46.0)
Hemoglobin: 7.9 g/dL — ABNORMAL LOW (ref 12.0–15.0)
MCH: 31.3 pg (ref 26.0–34.0)
MCHC: 31.5 g/dL (ref 30.0–36.0)
MCV: 99.6 fL (ref 80.0–100.0)
Platelets: 448 K/uL — ABNORMAL HIGH (ref 150–400)
RBC: 2.52 MIL/uL — ABNORMAL LOW (ref 3.87–5.11)
RDW: 18.6 % — ABNORMAL HIGH (ref 11.5–15.5)
WBC: 5.8 K/uL (ref 4.0–10.5)
nRBC: 0 % (ref 0.0–0.2)

## 2023-12-10 LAB — GLUCOSE, CAPILLARY
Glucose-Capillary: 67 mg/dL — ABNORMAL LOW (ref 70–99)
Glucose-Capillary: 79 mg/dL (ref 70–99)
Glucose-Capillary: 81 mg/dL (ref 70–99)
Glucose-Capillary: 68 mg/dL — ABNORMAL LOW (ref 70–99)
Glucose-Capillary: 81 mg/dL (ref 70–99)
Glucose-Capillary: 85 mg/dL (ref 70–99)

## 2023-12-10 LAB — GLUCOSE, RANDOM: Glucose, Bld: 73 mg/dL (ref 70–99)

## 2023-12-10 LAB — BASIC METABOLIC PANEL WITH GFR
Anion gap: 6 (ref 5–15)
BUN: 8 mg/dL (ref 6–20)
CO2: 22 mmol/L (ref 22–32)
Calcium: 7.1 mg/dL — ABNORMAL LOW (ref 8.9–10.3)
Chloride: 109 mmol/L (ref 98–111)
Creatinine, Ser: 0.39 mg/dL — ABNORMAL LOW (ref 0.44–1.00)
GFR, Estimated: >60 mL/min (ref >60)
Glucose, Bld: 92 mg/dL (ref 70–99)
Potassium: 4 mmol/L (ref 3.5–5.1)
Sodium: 137 mmol/L (ref 135–145)

## 2023-12-10 MED ORDER — PANTOPRAZOLE SODIUM 40 MG PO TBEC
40.0000 mg | DELAYED_RELEASE_TABLET | Freq: Two times a day (BID) | ORAL | Status: DC
  Administered 2023-12-10 – 2023-12-11 (×2): 40 mg via ORAL
  Filled 2023-12-10 (×2): qty 1

## 2023-12-10 MED ORDER — DEXTROSE 10 % IV SOLN: INTRAVENOUS | Status: DC

## 2023-12-10 NOTE — Progress Notes (Signed)
 12 Days Post-Op   Subjective/Chief Complaint: Several bowel movements yesterday Tolerating diet Eager to go downstairs to see her dog.    Objective: Vital signs in last 24 hours: Temp:  [98.6 F (37 C)-99.1 F (37.3 C)] 99.1 F (37.3 C) (11/16 0436) Pulse Rate:  [91-96] 91 (11/16 0436) Resp:  [18-20] 20 (11/16 0436) BP: (107-108)/(70-81) 108/74 (11/16 0436) SpO2:  [97 %-98 %] 98 % (11/16 0436) Weight:  [77.5 kg] 77.5 kg (11/16 0753) Last BM Date : 12/09/23 (per pt)  Intake/Output from previous day: 11/15 0701 - 11/16 0700 In: 3244.5 [I.V.:3244.5] Out: 900 [Urine:900] Intake/Output this shift: No intake/output data recorded.  Abd - soft, non-tender Wound - clean, granulating with minimal fibrinous exudate  Lab Results:  Recent Labs    12/08/23 0602  WBC 7.1  HGB 8.1*  HCT 25.8*  PLT 290   BMET Recent Labs    12/08/23 0602 12/09/23 0819  NA 133* 135  K 5.0 3.9  CL 102 104  CO2 26 25  GLUCOSE 80 67*  BUN 8 8  CREATININE 0.46 0.37*  CALCIUM 7.7* 7.1*   PT/INR No results for input(s): LABPROT, INR in the last 72 hours. ABG No results for input(s): PHART, HCO3 in the last 72 hours.  Invalid input(s): PCO2, PO2  Studies/Results: No results found.  Anti-infectives: Anti-infectives (From admission, onward)    Start     Dose/Rate Route Frequency Ordered Stop   11/26/23 2300  cefTRIAXone  (ROCEPHIN ) 2 g in sodium chloride  0.9 % 100 mL IVPB        2 g 200 mL/hr over 30 Minutes Intravenous Every 24 hours 11/26/23 2210 12/01/23 0028   11/26/23 2300  metroNIDAZOLE  (FLAGYL ) IVPB 500 mg        500 mg 100 mL/hr over 60 Minutes Intravenous Every 12 hours 11/26/23 2210 11/30/23 2347       Assessment/Plan: LAPAROTOMY, EXPLORATORY (N/A) - Exploratory Laparotomy 11/26/23 - Renee [name]/ Wilson SMALL INTESTINE WITH ANASTOMOSIS SECONDARY CLOSURE OF WOUND AND J TUBE CREATION with Dr. Stevie 11/4. -Afebrile. -WBC 7.1 and HGB 8.1. Continue to monitor  HGB. -Tolerated regular diet. Continuing to be conscious of intake as TF were discontinued.  -Progressing towards CIR placement -Will continue to follow.   FEN: Regular VTE: None currently ID: None.  LOS: 15 days    Renee [name] 12/10/2023

## 2023-12-10 NOTE — Progress Notes (Signed)
 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  She was admitted with multiple electrolyte abnormality, including hypocalcemia, hypokalemia, hypomagnesemia, prolonged QT.  Developed sudden abdominal pain earlier in the hospitalization and underwent emergent exploratory laparotomy with placement of wound VAC and closure of internal hernia defect.    Postoperatively she was sedated and continue on vent support, subsequently return to the OR for small bowel resection, jejunostomy tube insertion and closure of abdomen.  Required multiple vasopressors for shock.  Eventually weaned off of pressors and extubated.  Transferred to the hospitalist service 11/7.  Procedures/Events 11/1 admission for multiple electrolyte abnormalities 11/2 sudden abdominal pain, CT showed pneumatosis, underwent emergent diagnostic laparoscopy converted to exploratory laparotomy, placement of AbThera VAC (non-disposable DME), closure of internal hernia defect  11/3 CVC insertion 11/4 small bowel resection, jejunostomy tube insertion, closure of abdomen    Assessment & Plan:   Principal Problem:   Hypocalcemia Active Problems:   Hypokalemia   Hypothyroidism   Chronic pain syndrome   Hypertension   PTSD (post-traumatic stress disorder)   Morbid obesity (HCC)   Fibromyalgia   Generalized anxiety disorder   Hyperlipidemia   Hypomagnesemia   Shock (HCC)   Malnutrition of moderate degree   Thrombocytopenia   Major depressive disorder, recurrent severe without psychotic features (HCC)  1-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  Status post PJP - Shock resolved, electrolyte has stabilized.  Status  postsurgery. - Surgery following and managing. -J tube leaking.  Tube feeding discontinue.  Stable for CIR>   Anemia Thrombocytopenia - Multifactorial, nutritional perioperative, ABLA. - Hemoglobin down to 6 on 11/11 received 1 unit of packed red blood cell.  Increase appropriately to 8.  Multiple electrolyte abnormality: Hypocalcemia, hypokalemia, hypomagnesemia, hypophosphatemia Micronutrients deficiency including copper , vitamin E, zinc  - Secondary to bariatric surgery - Continue  supplementation - Corrected calcium at 8--on oral  supplementation. Discontinue IV calcium supplementation.  -Magnesium  replaced.   Severe hypothyroidism: - TSH 147 on admission, T3 low T4 very low.  Noncompliant with levothyroxine . - Continue with Synthroid  - Needs repeat TSH in 2 weeks   Moderate malnutrition Overweight - Nutritionist following  PTSD, anxiety, bipolar disorder, ADD, fibromyalgia -unable to sleep at night, started Trazodone.  -Psych consulted, started on paxil/   Hypoglycemia; Currently on D 10, had several low CBG yesterday.  Monitor. Decreasing rate IV fluids.   Diarrhea;  Nurse report patient is having multiples BM 11/15 Started florastore. Patient afebrile, denies abdominal pain.  Diarrhea with more consistency. Improving.    Complex social issues Questionable adrenal insufficiency - Steroids were considered on her previous [facility] admission, she needs to follow-up with endocrinologist as an outpatient. - Cortisol was at 11, 10 days ago.  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   Weight as of this encounter: 77.5 kg.   DVT  prophylaxis: SCDs, resume Lovenox if hemoglobin remained stable Code Status: Full code Family Communication: Care discussed with patient Disposition Plan:  Status is: Inpatient Remains  inpatient appropriate because: for CIR   Consultants:  General surgery  Procedures:  none  Antimicrobials:    Subjective: Patient report dark, black stool.  She then had BM that was brown per nurse report.   Objective: Vitals:   12/09/23 2027 12/10/23 0436 12/10/23 0753 12/10/23 1211  BP: 108/81 108/74  101/73  Pulse: 96 91  (!) 101  Resp: 20 20  18   Temp: 98.8 F (37.1 C) 99.1 F (37.3 C)  98.5 F (36.9 C)  TempSrc: Oral Oral  Oral  SpO2: 97% 98%  100%  Weight:   77.5 kg   Height:        Intake/Output Summary (Last 24 hours) at 12/10/2023 1538 Last data filed at 12/10/2023 1521 Gross per 24 hour  Intake 3510.53 ml  Output 900 ml  Net 2610.53 ml   Filed Weights   12/05/23 0500 12/06/23 0500 12/10/23 0753  Weight: 94.5 kg 93.6 kg 77.5 kg    Examination:  General exam: NAD Respiratory system: CTA Cardiovascular system: S 1, S 2 RRR Gastrointestinal system: BS present, NT, J tube in placed.  Central nervous system: Alert.  Extremities: No edema    Data Reviewed: I have personally reviewed following labs and imaging studies  CBC: Recent Labs  Lab 12/04/23 0258 12/05/23 0935 12/06/23 0601 12/08/23 0602 12/10/23 1217  WBC 7.9 10.0 10.1 7.1 5.8  HGB 7.0* 6.8* 8.7* 8.1* 7.9*  HCT 21.9* 20.7* 27.2* 25.8* 25.1*  MCV 95.2 95.0 97.5 97.0 99.6  PLT 88* 154 157 290 448*   Basic Metabolic Panel: Recent Labs  Lab 12/03/23 1710 12/04/23 0258 12/04/23 1801 12/05/23 0401 12/05/23 2006 12/06/23 0601 12/08/23 0602 12/09/23 0819 12/10/23 1217  NA 136 134* 133* 134* 134* 133* 133* 135 137  K 5.1 5.2* 5.2* 5.2* 4.7 4.8 5.0 3.9 4.0  CL 113* 110 106 107 103 105 102 104 109  CO2 20* 21* 23 22 24 24 26 25 22   GLUCOSE 108* 101* 84 84 49* 78 80 67* 92  BUN 7 7 9 8 9 8 8 8  8   CREATININE 0.35* 0.35* 0.36* 0.38* 0.37* 0.36* 0.46 0.37* 0.39*  CALCIUM 7.7* 7.7* 7.3* 7.2* 6.6* 6.9* 7.7* 7.1* 7.1*  MG 1.7 1.6* 1.8 1.7  --  1.7 1.8  --   --   PHOS 1.9* 1.9* 3.3 2.5  --   --  2.5  --   --    GFR: Estimated Creatinine Clearance: 93.3 mL/min (A) (by C-G formula based on SCr of 0.39 mg/dL (L)). Liver Function Tests: Recent Labs  Lab 12/04/23 0258 12/05/23 0401 12/06/23 0601  AST 133* 43* 31  ALT 43 31 25  ALKPHOS 397* 304* 225*  BILITOT 0.3 0.2 0.3  PROT 4.2* 4.5* 4.7*  ALBUMIN 1.8* 2.1* 2.1*   No results for input(s): LIPASE, AMYLASE in the last 168 hours. No results for input(s): AMMONIA in the last 168 hours. Coagulation Profile: No results for input(s): INR, PROTIME in the last 168 hours.  Cardiac Enzymes: No results for input(s): CKTOTAL, CKMB, CKMBINDEX, TROPONINI in the last 168 hours. BNP (last 3 results) No results for input(s): PROBNP in the last 8760 hours. HbA1C: No results for input(s): HGBA1C in the last 72 hours. CBG: Recent Labs  Lab 12/09/23 2024 12/09/23 2354 12/10/23 0433 12/10/23 0745 12/10/23 1208  GLUCAP 76 74 67* 79 81   Lipid Profile: No results for input(s): CHOL, HDL, LDLCALC, TRIG, CHOLHDL, LDLDIRECT in the last 72 hours. Thyroid  Function Tests: No  results for input(s): TSH, T4TOTAL, FREET4, T3FREE, THYROIDAB in the last 72 hours. Anemia Panel: No results for input(s): VITAMINB12, FOLATE, FERRITIN, TIBC, IRON, RETICCTPCT in the last 72 hours. Sepsis Labs: No results for input(s): PROCALCITON, LATICACIDVEN in the last 168 hours.  No results found for this or any previous visit (from the past 240 hours).        Radiology Studies: No results found.       Scheduled Meds:  ascorbic acid  500 mg Oral Daily   calcium citrate  200 mg of elemental calcium Oral TID WC   copper   4 mg Oral BID   Followed by   NOREEN ON 12/15/2023] copper   2 mg Oral TID    Followed by   NOREEN ON 12/22/2023] copper   4 mg Oral Daily   Followed by   NOREEN ON 12/29/2023] copper   2 mg Oral Daily   folic acid   1 mg Oral Daily   lactobacillus  1 g Oral TID WC   levothyroxine   200 mcg Oral Q0600   melatonin  3 mg Oral QHS   multivitamin with minerals  1 tablet Oral Daily   pantoprazole   40 mg Oral BID   PARoxetine  10 mg Oral Daily   Ensure Max Protein  11 oz Oral BID   selenium  100 mcg Oral Daily   sodium chloride  flush  3 mL Intravenous Q12H   traZODone  75 mg Oral QHS   vitamin A   50,000 Units Oral Daily   Followed by   NOREEN ON 12/22/2023] vitamin A   10,000 Units Oral Daily   [START ON 12/11/2023] Vitamin D  (Ergocalciferol )  50,000 Units Oral Q7 days   vitamin E  400 Units Oral Daily   zinc  sulfate (50mg  elemental zinc )  220 mg Oral Daily   Continuous Infusions:  dextrose  50 mL/hr at 12/10/23 1521     LOS: 15 days    Time spent: 35 Minutes    Gasper Hopes A Hildred Pharo, MD [facility]   If 7PM-7AM, please contact night-coverage [email or web address]  12/10/2023, 3:38 PM

## 2023-12-10 NOTE — Plan of Care (Signed)
   Problem: Education: Goal: Knowledge of General Education information will improve Description Including pain rating scale, medication(s)/side effects and non-pharmacologic comfort measures Outcome: Progressing   Problem: Clinical Measurements: Goal: Ability to maintain clinical measurements within normal limits will improve Outcome: Progressing   Problem: Activity: Goal: Risk for activity intolerance will decrease Outcome: Progressing   Problem: Nutrition: Goal: Adequate nutrition will be maintained Outcome: Progressing   Problem: Safety: Goal: Ability to remain free from injury will improve Outcome: Progressing   Problem: Skin Integrity: Goal: Risk for impaired skin integrity will decrease Outcome: Progressing

## 2023-12-10 NOTE — Plan of Care (Signed)
 Pt found picking at abd dressings, Hands stained with drainage from g tube, IV removed. Pt denies removing anything.   Problem: Nutrition: Goal: Adequate nutrition will be maintained Outcome: Progressing   Problem: Coping: Goal: Level of anxiety will decrease Outcome: Progressing   Problem: Pain Managment: Goal: General experience of comfort will improve and/or be controlled Outcome: Progressing   Problem: Safety: Goal: Ability to remain free from injury will improve Outcome: Progressing   Problem: Education: Goal: Knowledge of General Education information will improve Description: Including pain rating scale, medication(s)/side effects and non-pharmacologic comfort measures Outcome: Not Progressing   Problem: Health Behavior/Discharge Planning: Goal: Ability to manage health-related needs will improve Outcome: Not Progressing   Problem: Activity: Goal: Risk for activity intolerance will decrease Outcome: Not Progressing

## 2023-12-11 ENCOUNTER — Encounter (HOSPITAL_COMMUNITY): Payer: Self-pay | Admitting: Physical Medicine and Rehabilitation

## 2023-12-11 ENCOUNTER — Other Ambulatory Visit: Payer: Self-pay

## 2023-12-11 ENCOUNTER — Inpatient Hospital Stay (HOSPITAL_COMMUNITY)
Admission: AD | Admit: 2023-12-11 | Discharge: 2023-12-18 | DRG: 945 | Disposition: A | Discharge status: Home / Self Care | Source: Intra-hospital | Attending: Physical Medicine & Rehabilitation | Admitting: Physical Medicine & Rehabilitation

## 2023-12-11 DIAGNOSIS — E876 Hypokalemia: Secondary | ICD-10-CM | POA: Diagnosis present

## 2023-12-11 DIAGNOSIS — E2740 Unspecified adrenocortical insufficiency: Secondary | ICD-10-CM | POA: Diagnosis present

## 2023-12-11 DIAGNOSIS — E43 Unspecified severe protein-calorie malnutrition: Secondary | ICD-10-CM | POA: Diagnosis present

## 2023-12-11 DIAGNOSIS — D75839 Thrombocytosis, unspecified: Secondary | ICD-10-CM | POA: Diagnosis present

## 2023-12-11 DIAGNOSIS — Z880 Allergy status to penicillin: Secondary | ICD-10-CM

## 2023-12-11 DIAGNOSIS — Z818 Family history of other mental and behavioral disorders: Secondary | ICD-10-CM

## 2023-12-11 DIAGNOSIS — Z931 Gastrostomy status: Secondary | ICD-10-CM | POA: Diagnosis not present

## 2023-12-11 DIAGNOSIS — I10 Essential (primary) hypertension: Secondary | ICD-10-CM | POA: Diagnosis present

## 2023-12-11 DIAGNOSIS — E669 Obesity, unspecified: Secondary | ICD-10-CM | POA: Diagnosis present

## 2023-12-11 DIAGNOSIS — K219 Gastro-esophageal reflux disease without esophagitis: Secondary | ICD-10-CM | POA: Diagnosis present

## 2023-12-11 DIAGNOSIS — Z8249 Family history of ischemic heart disease and other diseases of the circulatory system: Secondary | ICD-10-CM

## 2023-12-11 DIAGNOSIS — R578 Other shock: Secondary | ICD-10-CM | POA: Diagnosis present

## 2023-12-11 DIAGNOSIS — D649 Anemia, unspecified: Secondary | ICD-10-CM | POA: Diagnosis present

## 2023-12-11 DIAGNOSIS — Z79890 Hormone replacement therapy (postmenopausal): Secondary | ICD-10-CM

## 2023-12-11 DIAGNOSIS — K55029 Acute infarction of small intestine, extent unspecified: Secondary | ICD-10-CM

## 2023-12-11 DIAGNOSIS — E039 Hypothyroidism, unspecified: Secondary | ICD-10-CM | POA: Diagnosis present

## 2023-12-11 DIAGNOSIS — M797 Fibromyalgia: Secondary | ICD-10-CM | POA: Diagnosis present

## 2023-12-11 DIAGNOSIS — R109 Unspecified abdominal pain: Secondary | ICD-10-CM | POA: Diagnosis not present

## 2023-12-11 DIAGNOSIS — K5900 Constipation, unspecified: Secondary | ICD-10-CM | POA: Diagnosis not present

## 2023-12-11 DIAGNOSIS — F4310 Post-traumatic stress disorder, unspecified: Secondary | ICD-10-CM | POA: Diagnosis present

## 2023-12-11 DIAGNOSIS — E162 Hypoglycemia, unspecified: Secondary | ICD-10-CM | POA: Diagnosis present

## 2023-12-11 DIAGNOSIS — Z91040 Latex allergy status: Secondary | ICD-10-CM

## 2023-12-11 DIAGNOSIS — R32 Unspecified urinary incontinence: Secondary | ICD-10-CM | POA: Diagnosis present

## 2023-12-11 DIAGNOSIS — Z9884 Bariatric surgery status: Secondary | ICD-10-CM

## 2023-12-11 DIAGNOSIS — Z79899 Other long term (current) drug therapy: Secondary | ICD-10-CM | POA: Diagnosis not present

## 2023-12-11 DIAGNOSIS — F319 Bipolar disorder, unspecified: Secondary | ICD-10-CM | POA: Diagnosis present

## 2023-12-11 DIAGNOSIS — Z6826 Body mass index (BMI) 26.0-26.9, adult: Secondary | ICD-10-CM

## 2023-12-11 DIAGNOSIS — M109 Gout, unspecified: Secondary | ICD-10-CM | POA: Diagnosis present

## 2023-12-11 DIAGNOSIS — Z813 Family history of other psychoactive substance abuse and dependence: Secondary | ICD-10-CM

## 2023-12-11 DIAGNOSIS — K912 Postsurgical malabsorption, not elsewhere classified: Secondary | ICD-10-CM

## 2023-12-11 DIAGNOSIS — E785 Hyperlipidemia, unspecified: Secondary | ICD-10-CM | POA: Diagnosis present

## 2023-12-11 DIAGNOSIS — Z87891 Personal history of nicotine dependence: Secondary | ICD-10-CM | POA: Diagnosis not present

## 2023-12-11 DIAGNOSIS — R5381 Other malaise: Secondary | ICD-10-CM | POA: Diagnosis present

## 2023-12-11 DIAGNOSIS — F3131 Bipolar disorder, current episode depressed, mild: Secondary | ICD-10-CM

## 2023-12-11 DIAGNOSIS — Z91199 Patient's noncompliance with other medical treatment and regimen due to unspecified reason: Secondary | ICD-10-CM

## 2023-12-11 DIAGNOSIS — Z833 Family history of diabetes mellitus: Secondary | ICD-10-CM | POA: Diagnosis not present

## 2023-12-11 DIAGNOSIS — Z8261 Family history of arthritis: Secondary | ICD-10-CM

## 2023-12-11 DIAGNOSIS — G8929 Other chronic pain: Secondary | ICD-10-CM | POA: Diagnosis present

## 2023-12-11 DIAGNOSIS — Z811 Family history of alcohol abuse and dependence: Secondary | ICD-10-CM

## 2023-12-11 DIAGNOSIS — K9419 Other complications of enterostomy: Secondary | ICD-10-CM | POA: Diagnosis not present

## 2023-12-11 DIAGNOSIS — F419 Anxiety disorder, unspecified: Secondary | ICD-10-CM | POA: Diagnosis present

## 2023-12-11 DIAGNOSIS — Z888 Allergy status to other drugs, medicaments and biological substances status: Secondary | ICD-10-CM

## 2023-12-11 DIAGNOSIS — Z91148 Patient's other noncompliance with medication regimen for other reason: Secondary | ICD-10-CM

## 2023-12-11 DIAGNOSIS — T7840XD Allergy, unspecified, subsequent encounter: Secondary | ICD-10-CM

## 2023-12-11 LAB — GLUCOSE, CAPILLARY
Glucose-Capillary: 92 mg/dL (ref 70–99)
Glucose-Capillary: 91 mg/dL (ref 70–99)
Glucose-Capillary: 57 mg/dL — ABNORMAL LOW (ref 70–99)
Glucose-Capillary: 67 mg/dL — ABNORMAL LOW (ref 70–99)
Glucose-Capillary: 62 mg/dL — ABNORMAL LOW (ref 70–99)
Glucose-Capillary: 96 mg/dL (ref 70–99)
Glucose-Capillary: 85 mg/dL (ref 70–99)
Glucose-Capillary: 61 mg/dL — ABNORMAL LOW (ref 70–99)
Glucose-Capillary: 68 mg/dL — ABNORMAL LOW (ref 70–99)
Glucose-Capillary: 76 mg/dL (ref 70–99)

## 2023-12-11 LAB — BASIC METABOLIC PANEL WITH GFR
Anion gap: 6 (ref 5–15)
BUN: 6 mg/dL (ref 6–20)
CO2: 21 mmol/L — ABNORMAL LOW (ref 22–32)
Calcium: 7.3 mg/dL — ABNORMAL LOW (ref 8.9–10.3)
Chloride: 112 mmol/L — ABNORMAL HIGH (ref 98–111)
Creatinine, Ser: 0.34 mg/dL — ABNORMAL LOW (ref 0.44–1.00)
GFR, Estimated: >60 mL/min (ref >60)
Glucose, Bld: 83 mg/dL (ref 70–99)
Potassium: 4.2 mmol/L (ref 3.5–5.1)
Sodium: 139 mmol/L (ref 135–145)

## 2023-12-11 LAB — CBC
HCT: 24.6 % — ABNORMAL LOW (ref 36.0–46.0)
Hemoglobin: 7.6 g/dL — ABNORMAL LOW (ref 12.0–15.0)
MCH: 31.3 pg (ref 26.0–34.0)
MCHC: 30.9 g/dL (ref 30.0–36.0)
MCV: 101.2 fL — ABNORMAL HIGH (ref 80.0–100.0)
Platelets: 454 K/uL — ABNORMAL HIGH (ref 150–400)
RBC: 2.43 MIL/uL — ABNORMAL LOW (ref 3.87–5.11)
RDW: 18.7 % — ABNORMAL HIGH (ref 11.5–15.5)
WBC: 5.7 K/uL (ref 4.0–10.5)
nRBC: 0 % (ref 0.0–0.2)

## 2023-12-11 LAB — CORTISOL-AM, BLOOD: Cortisol - AM: 8.1 ug/dL (ref 6.7–22.6)

## 2023-12-11 MED ORDER — MELATONIN 3 MG PO TABS
3.0000 mg | ORAL_TABLET | Freq: Every day | ORAL | Status: DC
  Administered 2023-12-11 – 2023-12-17 (×7): 3 mg via ORAL
  Filled 2023-12-11 (×7): qty 1

## 2023-12-11 MED ORDER — VITAMIN A 3 MG (10000 UNIT) PO CAPS: 10000.0000 [IU] | ORAL_CAPSULE | Freq: Every day | ORAL | Status: DC

## 2023-12-11 MED ORDER — COPPER 2 MG PO TABS: 2.0000 mg | Freq: Every day | Status: DC

## 2023-12-11 MED ORDER — VITAMIN A 3 MG (10000 UNIT) PO CAPS: ORAL_CAPSULE | ORAL | 0 refills | Status: DC

## 2023-12-11 MED ORDER — COPPER 2 MG PO TABS
2.0000 mg | Freq: Three times a day (TID) | Status: DC
  Administered 2023-12-15 – 2023-12-18 (×10): 2 mg via ORAL
  Filled 2023-12-11 (×12): qty 1

## 2023-12-11 MED ORDER — ZINC SULFATE 220 (50 ZN) MG PO CAPS
220.0000 mg | ORAL_CAPSULE | Freq: Every day | ORAL | Status: DC
  Administered 2023-12-11 – 2023-12-17 (×7): 220 mg via ORAL
  Filled 2023-12-11 (×7): qty 1

## 2023-12-11 MED ORDER — VITAMIN D (ERGOCALCIFEROL) 1.25 MG (50000 UNIT) PO CAPS: 50000.0000 [IU] | ORAL_CAPSULE | ORAL | 0 refills | Status: DC

## 2023-12-11 MED ORDER — ACETAMINOPHEN 325 MG PO TABS
650.0000 mg | ORAL_TABLET | Freq: Four times a day (QID) | ORAL | Status: DC | PRN
  Administered 2023-12-12 – 2023-12-14 (×2): 650 mg
  Filled 2023-12-11 (×2): qty 2

## 2023-12-11 MED ORDER — FLORANEX PO PACK: 1.0000 g | PACK | Freq: Three times a day (TID) | ORAL | 0 refills | Status: DC

## 2023-12-11 MED ORDER — TRAZODONE HCL 50 MG PO TABS
75.0000 mg | ORAL_TABLET | Freq: Every day | ORAL | Status: DC
  Administered 2023-12-11 – 2023-12-17 (×7): 75 mg via ORAL
  Filled 2023-12-11 (×7): qty 2

## 2023-12-11 MED ORDER — PANTOPRAZOLE SODIUM 40 MG PO TBEC
40.0000 mg | DELAYED_RELEASE_TABLET | Freq: Two times a day (BID) | ORAL | Status: DC
  Administered 2023-12-11 – 2023-12-18 (×14): 40 mg via ORAL
  Filled 2023-12-11 (×14): qty 1

## 2023-12-11 MED ORDER — OXYCODONE HCL 5 MG PO TABS
5.0000 mg | ORAL_TABLET | Freq: Four times a day (QID) | ORAL | Status: DC | PRN
  Administered 2023-12-11: 10 mg via ORAL
  Administered 2023-12-12: 5 mg via ORAL
  Filled 2023-12-11: qty 2
  Filled 2023-12-11: qty 1

## 2023-12-11 MED ORDER — VITAMIN D (ERGOCALCIFEROL) 1.25 MG (50000 UNIT) PO CAPS
50000.0000 [IU] | ORAL_CAPSULE | ORAL | Status: DC
  Administered 2023-12-11: 50000 [IU] via ORAL
  Filled 2023-12-11: qty 1

## 2023-12-11 MED ORDER — ENSURE MAX PROTEIN PO LIQD
11.0000 [oz_av] | Freq: Two times a day (BID) | ORAL | Status: DC
  Administered 2023-12-11 – 2023-12-16 (×10): 11 [oz_av] via ORAL

## 2023-12-11 MED ORDER — DEXTROSE 10 % IV SOLN
INTRAVENOUS | Status: DC
  Filled 2023-12-11: qty 500
  Filled 2023-12-11: qty 1000
  Filled 2023-12-11 (×3): qty 500
  Filled 2023-12-11 (×3): qty 1000

## 2023-12-11 MED ORDER — SELENIUM 50 MCG PO TABS: 100.0000 ug | ORAL_TABLET | Freq: Every day | ORAL | 0 refills | Status: DC

## 2023-12-11 MED ORDER — HYDROXYZINE HCL 25 MG PO TABS: 25.0000 mg | ORAL_TABLET | Freq: Four times a day (QID) | ORAL | 0 refills | Status: DC | PRN

## 2023-12-11 MED ORDER — TRAZODONE HCL 150 MG PO TABS: 75.0000 mg | ORAL_TABLET | Freq: Every day | ORAL | 0 refills | Status: DC

## 2023-12-11 MED ORDER — CALCIUM CITRATE 950 (200 CA) MG PO TABS: 200.0000 mg | ORAL_TABLET | Freq: Three times a day (TID) | ORAL | 0 refills | Status: DC

## 2023-12-11 MED ORDER — VITAMIN E 45 MG (100 UNIT) PO CAPS
400.0000 [IU] | ORAL_CAPSULE | Freq: Every day | ORAL | Status: DC
  Administered 2023-12-11 – 2023-12-17 (×7): 400 [IU] via ORAL
  Filled 2023-12-11 (×8): qty 4

## 2023-12-11 MED ORDER — VITAMIN A 3 MG (10000 UNIT) PO CAPS
50000.0000 [IU] | ORAL_CAPSULE | Freq: Every day | ORAL | Status: DC
  Administered 2023-12-12 – 2023-12-18 (×7): 50000 [IU] via ORAL
  Filled 2023-12-11 (×7): qty 5

## 2023-12-11 MED ORDER — ASCORBIC ACID 500 MG PO TABS: 500.0000 mg | ORAL_TABLET | Freq: Every day | ORAL | 0 refills | Status: DC

## 2023-12-11 MED ORDER — PAROXETINE HCL 10 MG PO TABS: 10.0000 mg | ORAL_TABLET | Freq: Every day | ORAL | 1 refills | Status: DC

## 2023-12-11 MED ORDER — SELENIUM 200 MCG PO TABS
100.0000 ug | ORAL_TABLET | Freq: Every day | ORAL | Status: DC
  Administered 2023-12-11 – 2023-12-17 (×7): 100 ug via ORAL
  Filled 2023-12-11: qty 2
  Filled 2023-12-11 (×4): qty 1
  Filled 2023-12-11: qty 2
  Filled 2023-12-11 (×5): qty 1

## 2023-12-11 MED ORDER — COPPER 2 MG PO TABS
4.0000 mg | Freq: Two times a day (BID) | Status: AC
  Administered 2023-12-11 – 2023-12-14 (×7): 4 mg via ORAL
  Filled 2023-12-11 (×8): qty 2

## 2023-12-11 MED ORDER — FLORANEX PO PACK
1.0000 g | PACK | Freq: Three times a day (TID) | ORAL | Status: DC
  Administered 2023-12-11 – 2023-12-18 (×20): 1 g via ORAL
  Filled 2023-12-11 (×23): qty 1

## 2023-12-11 MED ORDER — COPPER 2 MG PO TABS: 0 refills | Status: DC

## 2023-12-11 MED ORDER — VITAMIN E 180 MG (400 UNIT) PO CAPS: 400.0000 [IU] | ORAL_CAPSULE | Freq: Every day | ORAL | 0 refills | Status: DC

## 2023-12-11 MED ORDER — HYDROXYZINE HCL 25 MG PO TABS
25.0000 mg | ORAL_TABLET | Freq: Four times a day (QID) | ORAL | Status: DC | PRN
  Administered 2023-12-17: 25 mg via ORAL
  Filled 2023-12-11: qty 1

## 2023-12-11 MED ORDER — LEVOTHYROXINE SODIUM 100 MCG PO TABS
200.0000 ug | ORAL_TABLET | Freq: Every day | ORAL | Status: DC
  Administered 2023-12-12 – 2023-12-18 (×7): 200 ug via ORAL
  Filled 2023-12-11 (×7): qty 2

## 2023-12-11 MED ORDER — COPPER 2 MG PO TABS: 4.0000 mg | Freq: Every day | Status: DC

## 2023-12-11 MED ORDER — VITAMIN C 500 MG PO TABS
500.0000 mg | ORAL_TABLET | Freq: Every day | ORAL | Status: AC
  Administered 2023-12-12 – 2023-12-16 (×5): 500 mg via ORAL
  Filled 2023-12-11 (×5): qty 1

## 2023-12-11 MED ORDER — MELATONIN 3 MG PO TABS: 3.0000 mg | ORAL_TABLET | Freq: Every day | ORAL | 0 refills | Status: DC

## 2023-12-11 MED ORDER — PAROXETINE HCL 10 MG PO TABS
10.0000 mg | ORAL_TABLET | Freq: Every day | ORAL | Status: DC
  Administered 2023-12-12 – 2023-12-18 (×7): 10 mg via ORAL
  Filled 2023-12-11 (×7): qty 1

## 2023-12-11 MED ORDER — CALCIUM CITRATE 950 (200 CA) MG PO TABS
200.0000 mg | ORAL_TABLET | Freq: Three times a day (TID) | ORAL | Status: DC
  Administered 2023-12-11 – 2023-12-18 (×21): 950 mg via ORAL
  Filled 2023-12-11 (×21): qty 1

## 2023-12-11 MED ORDER — ZINC SULFATE 220 (50 ZN) MG PO CAPS: 220.0000 mg | ORAL_CAPSULE | Freq: Every day | ORAL | 0 refills | Status: DC

## 2023-12-11 MED ORDER — ADULT MULTIVITAMIN W/MINERALS CH
1.0000 | ORAL_TABLET | Freq: Every day | ORAL | Status: DC
  Administered 2023-12-11 – 2023-12-17 (×7): 1 via ORAL
  Filled 2023-12-11 (×7): qty 1

## 2023-12-11 MED ORDER — FOLIC ACID 1 MG PO TABS
1.0000 mg | ORAL_TABLET | Freq: Every day | ORAL | Status: DC
  Administered 2023-12-12 – 2023-12-18 (×7): 1 mg via ORAL
  Filled 2023-12-11 (×7): qty 1
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
## 2023-12-11 NOTE — Progress Notes (Signed)
 I attest to student documentation.  Brek Reece V. Tashaun Obey, MSN-RN Nursing Faculty/Clinical Instructor [facility] Associate Degree Nursing Program
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
## 2023-12-11 NOTE — Discharge Summary (Signed)
 Physician Discharge Summary  Patient ID: Renee [name] MRN: [id number] DOB/AGE: 48-Jan-1977 48 y.o.  Admit date: 12/11/2023 Discharge date: 12/18/2023  Discharge Diagnoses:  Principal Problem:   Debility Active Problems:   Bipolar affective disorder, currently depressed, mild (HCC)   Protein-calorie malnutrition, severe Mesenteric ischemia DVT prophylaxis Pain management PTSD/bipolar disorder Hemorrhagic shock Multiple electrolyte abnormalities Severe hypothyroidism Moderate malnutrition Questionable adrenal insufficiency Medical noncompliance Obesity/BMI 26.75 Hypertension Hyperlipidemia Gout  Discharged Condition: Stable  Significant Diagnostic Studies: DG Abd 1 View Result Date: 12/17/2023 EXAM: 1 VIEW XRAY OF THE ABDOMEN 12/17/2023 11:40:00 AM COMPARISON: Comparison with 12/07/2023. CLINICAL HISTORY: Abdominal pain. FINDINGS: LINES, TUBES AND DEVICES: Left mid-abdominal percutaneous catheter in place without change in position. BOWEL: Diffusely distended colon filled with stool and gas. This is progressing since previous study and likely represents constipation and/or ileus. No small bowel distention. SOFT TISSUES: Surgical clips are present. No radiopaque stones. Visualized soft tissue contours appear intact. BONES: No acute osseous abnormality. Degenerative changes in the spine and hips. VISUALIZED LUNG BASES: Visualized lung bases are clear. IMPRESSION: 1. Progressive distention of the colon filled with stool and gas, likely representing constipation and/or ileus. 2. No small bowel distention. Electronically signed by: Elsie Gravely MD 12/17/2023 12:21 PM EST RP Workstation: [id number]   DG Abd 1 View Result Date: 12/07/2023 CLINICAL DATA:  Possible feeding tube dysfunction. EXAM: DG ABDOMEN 1V COMPARISON:  KUB 11/27/2023 FINDINGS: Interval removal of patient's nasogastric tube. Evidence of feeding tube over the left lower abdomen smoothly coiled with tip over the left  lower quadrant. Several surgical clips over the left lower quadrant just left of the L4-5 level. Single surgical clip over the right upper quadrant. Several suture lines throughout the abdomen compatible with multiple previous bowel surgeries. Bowel gas pattern is nonobstructive. Mild fecal retention throughout the colon most prominent over the rectum. No free peritoneal air. Remaining bony structures are unremarkable. IMPRESSION: 1. Nonobstructive bowel gas pattern with mild fecal retention throughout the colon most prominent over the rectum. 2. Evidence of feeding tube over the left lower abdomen smoothly coiled with tip over the left lower quadrant. Electronically Signed   By: Toribio Agreste M.D.   On: 12/07/2023 07:53   DG Chest Port 1 View Result Date: 11/28/2023 CLINICAL DATA:  Respiratory failure. EXAM: PORTABLE CHEST 1 VIEW COMPARISON:  11/27/2023 FINDINGS: Satisfactory position of the endotracheal tube. The nasogastric tube has been advanced into the stomach with its tip and side hole not included. The right jugular catheter has been redirected with its tip in the region of the proximal superior vena cava. Stable normal-sized heart. Poor inspiration with improvement. Decreased right basilar atelectasis with minimal atelectasis remaining. Increased linear and patchy density in the left lower lung zone. No pleural fluid seen. Unremarkable bones. IMPRESSION: 1. Increased left lower lung zone atelectasis and possible pneumonia. 2. Improved right basilar atelectasis with minimal atelectasis remaining. 3. Improved positions of support apparatus. Electronically Signed   By: Elspeth Bathe M.D.   On: 11/28/2023 17:00   DG Abd 1 View Result Date: 11/27/2023 EXAM: 1 VIEW XRAY OF THE ABDOMEN 11/27/2023 10:59:00 AM COMPARISON: 11/27/2023 CLINICAL HISTORY: FINDINGS: LINES, TUBES AND DEVICES: Enteric tube coursing below the diaphragm with distal tip and side port terminating within the expected location of the  stomach. BOWEL: Gaseous distension of the colon. Possible Rigler sign within the right upper quadrant of the abdomen which in the setting of recent exploratory laparotomy is compatible with pneumoperitoneum. SOFT TISSUES: No opaque urinary calculi. BONES: No  acute osseous abnormality. IMPRESSION: 1. The enteric tube and sideport are well below the GE junction in the expected location of the stomach. 2. Possible pneumoperitoneum, suggested by a Rigler sign in the right upper quadrant. This is in keeping with exploratory laparotomy on 11/26/2023 3. Gaseous colonic distension. Electronically signed by: Waddell Calk MD 11/27/2023 01:00 PM EST RP Workstation: [id number]   DG Abd 1 View Addendum Date: 11/27/2023 ******** ADDENDUM #1 ******** ADDENDUM: Study discussed by telephone with Dr. Neda at [phone] hours on 11/27/2023. ---------------------------------------------------- Electronically signed by: Helayne Hurst MD 11/27/2023 09:58 AM EST RP Workstation: [id number]   Result Date: 11/27/2023 ******** ORIGINAL REPORT ******** EXAM: 1 VIEW XRAY OF THE ABDOMEN 11/27/2023 09:04:00 AM COMPARISON: CT abdomen and pelvis yesterday. CLINICAL HISTORY: Encounter for imaging study to confirm nasogastric (NG) tube placement. FINDINGS: LINES, TUBES AND DEVICES: Enteric tube is looped in the pharynx. Endotracheal tube tip at the level of the clavicles. BOWEL: Nonobstructive bowel gas pattern. SOFT TISSUES: No opaque urinary calculi. BONES: No acute osseous abnormality. IMPRESSION: 1. Enteric tube malpositioned with looping in the pharynx. . 2. Endotracheal tube tip at the level of the clavicles. Electronically signed by: Helayne Hurst MD 11/27/2023 09:20 AM EST RP Workstation: [id number]   DG CHEST PORT 1 VIEW Result Date: 11/27/2023 EXAM: 1 VIEW(S) XRAY OF THE CHEST 11/27/2023 06:36:00 AM COMPARISON: 11/27/2023 CLINICAL HISTORY: Encounter for central line placement FINDINGS: LINES, TUBES AND DEVICES: Right internal jugular  central venous catheter in place with tip in expected region of right distal axillary vein. Stable endotracheal tube with tip 4.5 cm above carina. Enteric tube in place with tip in expected region of mid esophagus. The esophageal temperature probe tip remains in place of the lower neck. LUNGS AND PLEURA: Low lung volumes. Bibasilar atelectasis. No focal pulmonary opacity. No pulmonary edema. No pleural effusion. No pneumothorax. HEART AND MEDIASTINUM: No acute abnormality of the cardiac and mediastinal silhouettes. BONES AND SOFT TISSUES: No acute osseous abnormality. IMPRESSION: 1. Persistent low lung volumes with bibasilar atelectasis. 2. Malpositioned enteric tube with tip projecting over the mid esophagus; recommend advancement. 3. The tip of the esophageal temperature probe remains in the lower neck region. 4. The right IJ catheter tip has been retracted and is now within the distal axillary vein. Recommend repositioning. 5. The urgent finding will be called to the ordering provider by the Professional Radiology Assistants (PRAs) and documented in the [facility] dashboard. Electronically signed by: Waddell Calk MD 11/27/2023 07:09 AM EST RP Workstation: [id number]   DG CHEST PORT 1 VIEW Result Date: 11/27/2023 EXAM: 1 VIEW(S) XRAY OF THE CHEST 11/27/2023 05:53:00 AM COMPARISON: 03/07/2022 CLINICAL HISTORY: Encounter for central line placement FINDINGS: LINES, TUBES AND DEVICES: Right IJ central catheter has been placed with tip in right axillary vein. Endotracheal tube in place with tip 4.5 cm above carina. Enteric tube in place with tip in mid esophagus. LUNGS AND PLEURA: Low lung volumes with bibasilar atelectasis. No focal pulmonary opacity. No pulmonary edema. No pleural effusion. No pneumothorax. HEART AND MEDIASTINUM: No acute abnormality of the cardiac and mediastinal silhouettes. BONES AND SOFT TISSUES: No acute osseous abnormality. IMPRESSION: 1. Malpositioned right IJ central venous catheter with tip  in the right axillary vein; recommend repositioning or exchange as clinically appropriate. 2. Enteric tube malpositioned with tip in the mid esophagus; recommend advancement into the stomach. 3. Endotracheal tube tip 4.5 cm above the carina, appropriate in position. 4. The urgent finding will be called to the ordering provider by the Professional Radiology Assistants (  PRAs) and documented in the [facility] dashboard. Electronically signed by: Waddell Calk MD 11/27/2023 06:00 AM EST RP Workstation: [id number]   CT ABDOMEN PELVIS W CONTRAST Result Date: 11/26/2023 CLINICAL DATA:  Severe abdominal pain, nonlocalized. EXAM: CT ABDOMEN AND PELVIS WITH CONTRAST TECHNIQUE: Multidetector CT imaging of the abdomen and pelvis was performed using the standard protocol following bolus administration of intravenous contrast. RADIATION DOSE REDUCTION: This exam was performed according to the departmental dose-optimization program which includes automated exposure control, adjustment of the mA and/or kV according to patient size and/or use of iterative reconstruction technique. CONTRAST:  OMNIPAQUE  IOHEXOL  300 MG/ML  SOLN COMPARISON:  03/08/2022. FINDINGS: Lower chest: Strandy atelectasis or infiltrate is present at the lung bases. Hepatobiliary: The liver enhances heterogeneously and pneumobilia is noted. Extensive portal venous gas is noted in the liver and portal vein. No biliary ductal dilatation is seen. A 4 mm hyperdense structure is present within the gallbladder. Pancreas: Unremarkable. No pancreatic ductal dilatation or surrounding inflammatory changes. Spleen: Normal in size without focal abnormality. Adrenals/Urinary Tract: The adrenal glands are within normal limits. The kidneys enhance symmetrically. No renal calculus or hydronephrosis bilaterally. The bladder is unremarkable. Stomach/Bowel: Gastric surgical changes are noted. There is thickening of the walls of the distal esophagus. Multiple loops of dilated  small bowel are noted in the abdomen measuring up to 4.8 cm. No definite transition point is seen. Patulous loops of small bowel are noted at anastomotic sites. There is questionable pneumatosis involving the small bowel. Appendix appears normal. A moderate amount of retained stool is present in the colon. Marked thickening of the walls of the rectum are noted. Vascular/Lymphatic: Extensive portal venous gas is noted with gas in the superior mesenteric vein extending into the mesentery. Aorta is normal in caliber. No abdominal or pelvic lymphadenopathy. Reproductive: Uterus and bilateral adnexa are unremarkable. Other: Mild ascites is noted and most pronounced in the perihepatic space. Anasarca is noted. Musculoskeletal: Degenerative changes are present in the thoracolumbar spine. No acute osseous abnormality. IMPRESSION: 1. Multiple dilated loops of small bowel in the abdomen measuring up to 4.8 cm with possible pneumatosis. No obvious transition point is seen. Findings are concerning for ischemic bowel. 2. Extensive portal venous gas extending into the liver and mesentery. 3. Marked thickening of the walls of the rectum, suggesting proctitis. 4. Thickening of the walls of the distal esophagus, possible infectious or inflammatory esophagitis. 5. Anasarca. Critical Value/emergent results were called by telephone at the time of interpretation on 11/26/2023 at 4:00 pm to provider [facility] , who verbally acknowledged these results. Electronically Signed   By: Leita Waddell M.D.   On: 11/26/2023 16:00   US  Abdomen Limited RUQ (LIVER/GB) Result Date: 11/26/2023 EXAM: Right Upper Quadrant Abdominal Ultrasound 11/26/2023 09:18:00 AM COMPARISON: US  Abdomen 03/08/2022. CLINICAL HISTORY: Elevated liver enzymes. TECHNIQUE: Real-time ultrasonography of the right upper quadrant of the abdomen was performed. FINDINGS: LIVER: The liver demonstrates an extremely abnormal appearance. The portal triads are diffusely echogenic  and the hepatic parenchyma is heterogeneous. No discrete masses are evident. BILIARY SYSTEM: The gallbladder and bile ducts are obscured. OTHER: No right upper quadrant ascites. IMPRESSION: 1. Extremely abnormal liver appearance with diffusely echogenic portal triads and heterogeneous hepatic parenchyma; no discrete hepatic mass is identified. 2. Gallbladder and bile ducts are obscured. Electronically signed by: Evalene Coho MD 11/26/2023 09:39 AM EST RP Workstation: [id number]    Labs:  Basic Metabolic Panel: Recent Labs  Lab 12/12/23 0509 12/14/23 0453 12/18/23 0448  NA 136  136 138  K 4.4 4.7 3.8  CL 111 108 108  CO2 21* 20* 22  GLUCOSE 87 79 72  BUN 6 10 9   CREATININE 0.47 0.49 0.50  CALCIUM  7.2* 7.4* 7.7*  MG  --  1.6* 1.7    CBC: Recent Labs  Lab 12/12/23 0509 12/14/23 0453 12/18/23 0448  WBC 6.2 7.5 6.5  NEUTROABS 2.1  --   --   HGB 7.5* 7.9* 8.7*  HCT 24.5* 25.4* 28.6*  MCV 99.6 100.0 100.7*  PLT 504* 541* 581*    CBG: Recent Labs  Lab 12/18/23 0003 12/18/23 0027 12/18/23 0353 12/18/23 0431 12/18/23 0804  GLUCAP 68* 83 68* 74 83   Family history.  Brother with alcohol  and drug abuse.  Mother with breast cancer father with diabetes and heart disease.  Denies any colon cancer esophageal cancer or rectal cancer  Brief HPI:   Renee [name] is a 48 y.o. 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  Presented to [facility] 11/25/2023 with persistent hypokalemia as well as abdominal pain with admission chemistries unremarkable except WBC 13,400 potassium 2.7 magnesium  1.1.  Noted in the ED blood pressure in the 90-100 systolic.  Patient received 4 g magnesium , 30 mL equivalent IV and 40 mL equivalent potassium started on IV  fluids.  CT of the abdomen showed pneumatosis and large portal venous gas.  Underwent diagnostic laparoscopic converted to exploratory laparotomy placement of AB Therevac closure of internal hernia defect 11/26/2023 per Dr.Tsuei.  Hospital course on 11/3 CVC insertion as well as 11/4 small bowel resection jejunostomy tube insertion and closure of abdominal wound complicated by hemorrhagic shock electrolyte abnormality.  Hemoglobin dropped to 6 on 11/11 received 1 unit packed red blood cells increasing to 8.0.  Multiple electrolyte abnormalities hypocalcemia hypokalemia hypomagnesemia hypophosphatemia and maintain on supplementation including IV calcium  supplement.  Findings of severe hypothyroidism TSH 147 on admission T3 low T4 very low noted to be noncompliant with levothyroxine  and currently remain on Synthroid .  There was some question of possible adrenal insufficiency steroids were considered on [facility] she will need follow-up endocrinologist as an outpatient noted latest cortisol level of 11.  Her diet was advanced to regular consistency.  Therapy evaluations completed due to patient's debility was admitted for a comprehensive rehab program.   Hospital Course: Renee [name] was admitted to rehab 12/11/2023 for inpatient therapies to consist of PT, ST and OT at least three hours five days a week. Past admission physiatrist, therapy team and rehab RN have worked together to provide customized collaborative inpatient rehab.  Pertaining to patient's debility related to mesenteric ischemia internal hernia status post emergent laparotomy placement of wound VAC closure of internal hernia defect 11/26/2023 with CVS and surgery 11/3 as well as Levan/4 small bowel resection jejunostomy tube insertion closure of abdomen.  Patient followed by general surgery.  Surgical site healing nicely wound care as directed.  SCDs for DVT prophylaxis.  Pain managed with use of oxycodone  as needed.  Mood  stabilization with history of PTSD bipolar disorder continued on Paxil  as well as melatonin and trazodone  for sleep with Atarax  as needed she was followed by psychiatry services Dr.Kaur.  Hemorrhagic shock received 1 unit packed red blood cells 11/11 follow-up CBC monitoring for any bleeding episodes.  Multiple electrolyte abnormalities hypocalcemia hypokalemia hypomagnesemia  hypophosphatemia would need close outpatient monitoring.  Severe hypothyroidism TSH 147 noted to be noncompliant with thyroid  medications follow-up TSH level in 2 weeks.  Moderate malnutrition jejunostomy tube in place dietary follow-up.  There was some question adrenal insufficiency steroids were considered advised follow-up outpatient endocrinology.   Blood pressures were monitored on TID basis and remained soft and monitored    Media Information   Document Information  Photos  12/10/13  12/11/2023 13:26  Attached To:  Hospital Encounter on 12/11/23  Source Information  Kirsteins, Prentice BRAVO, MD  [facility] [place]     Media Information   Document Information  Photos  11/17  12/11/2023 13:25  Attached To:  Hospital Encounter on 12/11/23  Source Information  Kirsteins, Prentice BRAVO, MD  [facility] [place]    Rehab course: During patient's stay in rehab weekly team conferences were held to monitor patient's progress, set goals and discuss barriers to discharge. At admission, patient required contact-guard sit to stand minimal assist step pivot transfers minimal assist 14 feet rolling walker  He/She  has had improvement in activity tolerance, balance, postural control as well as ability to compensate for deficits. He/She has had improvement in functional use RUE/LUE  and RLE/LLE as well as improvement in awareness.  Sessions focused on therapeutic activities to facilitate out of bed tolerance, transfer/bed mobility training and participation in self-care task.  Patient completed bed mobility with increased time  comes to long sitting then short sitting utilizing bilateral upper extremities to push to upright.  Patient completes transfers throughout sessions with supervision.  Ambulate rolling walker to the bathroom supervision, completes 3/3 toileting task with distant supervision.  She can ambulate without device to wheelchair by window with close supervision comes to sitting in wheelchair to participate with choosing close for her session.  Patient changes upper body garments with minimal assist for bra clasp and management.  Self propels from room to day room targeting bilateral upper extremity strength and endurance.  Perform stand pivot from bedside commode to edge of bed with contact-guard and no assist device.  Full family teaching completed plan discharge to home       Disposition:  Discharge disposition: 01-Home or Self Care        Diet: Regular  Special Instructions: No driving smoking or alcohol   Ambulatory referral obtained for endocrinology to assess possible adrenal insufficiency  Midline abdomen moist to dry dressings twice daily as needed to J-tube site and follow-up with Dr. Stevie 12/4  Medications at discharge. 1.  Adderall 30 mg twice daily 2.  Vitamin C  500 mg p.o. daily 3.  Calcium  citrate 950 mg 3 times daily with meals 4.  Copper  with taper as directed 5.  Flonase  2 sprays both nostrils daily as needed 6.  Folic acid  1 mg p.o. daily 7.  Atarax  25 mg every 6 hours as needed anxiety 8.  Lactobacillus 1 packet 3 times daily with meals 9.  Synthroid  200 mcg p.o. daily 10.  Melatonin 3 mg nightly 11.  Singulair  10 mg p.o. nightly 12.  Multivitamin daily 13.  Prilosec 40 mg twice daily 14.  Paxil  10 mg p.o. daily 15.  Selenium  50 mcg 2 tablets daily 16.  Vitamin B1 100 mg p.o. daily 17.  Trazodone  75 mg nightly 18.  Vitamin A  as directed 19.  Vitamin D  50,000 units every 7 days 20.  Vitamin D  400 units daily 21.  Zinc  sulfate 220 mg p.o. daily 22.  Flonase  2  sprays both nostrils daily as  needed 23.  Thiamine  100 mg daily 24.  Tylenol  as needed 25.  Vitamin D  400 units daily 26.  Magnesium  oxide 400 mg twice daily 27.  Oxycodone  10 mg every 4 hours as needed pain 30-35 minutes were spent completing discharge summary and discharge planning  Discharge Instructions     Ambulatory referral to Behavioral Health   Complete by: As directed    Evaluate and treat history of bipolar disorder   Ambulatory referral to Endocrinology   Complete by: As directed    Evaluate and treat for suspect adrenal insufficiency   Ambulatory referral to Occupational Therapy   Complete by: As directed    Evaluate and treat   Ambulatory referral to Physical Therapy   Complete by: As directed    Evaluate and treat        Follow-up Information     Emeline Joesph BROCKS, DO Follow up.   Specialty: Physical Medicine and Rehabilitation Why: No formal follow-up needed Contact information: [address] [place] [address] [phone]         Vincente Grip, MD Follow up.   Specialty: Psychiatry Why: Call for appointment Contact information: [address] [place] [address] [phone]         Dwane Odor, PA-C Follow up.   Specialty: Physician Assistant Why: Call for appointment Contact information: [address] [place] [address] [phone]         Kinsinger, Herlene Righter, MD Follow up on 12/28/2023.   Specialty: General Surgery Why: 11:10am, Arrive 30 minutes prior to your appointment time, Please bring your insurance card and photo ID Contact information: [address] [place] [address] [phone]                 Signed: Toribio PARAS Johnathan Tortorelli 12/18/2023, 11:04 AM

## 2023-12-11 NOTE — Progress Notes (Signed)
 Hypoglycemic Event  CBG: 68  Treatment: 4 oz juice/soda  Symptoms: None  Follow-up CBG: Time:2319 CBG Result:76  Possible Reasons for Event: Unknown  Comments/MD notified:Charge nurse aware    [place] FORBES [place]

## 2023-12-11 NOTE — Plan of Care (Signed)
  Problem: Consults Goal: RH GENERAL PATIENT EDUCATION Description: See Patient Education module for education specifics. Outcome: Progressing   Problem: RH BOWEL ELIMINATION Goal: RH STG MANAGE BOWEL WITH ASSISTANCE Description: STG Manage Bowel with mod I Assistance. Outcome: Progressing   Problem: RH BLADDER ELIMINATION Goal: RH STG MANAGE BLADDER WITH ASSISTANCE Description: STG Manage Bladder With mod I Assistance Outcome: Progressing   Problem: RH SKIN INTEGRITY Goal: RH STG SKIN FREE OF INFECTION/BREAKDOWN Description: Manage skin free of infection with mod I assistance Outcome: Progressing   Problem: RH SAFETY Goal: RH STG ADHERE TO SAFETY PRECAUTIONS W/ASSISTANCE/DEVICE Description: STG Adhere to Safety Precautions With mod I Assistance/Device. Outcome: Progressing   Problem: RH PAIN MANAGEMENT Goal: RH STG PAIN MANAGED AT OR BELOW PT'S PAIN GOAL Description: <4 w/ prns Outcome: Progressing   Problem: RH KNOWLEDGE DEFICIT GENERAL Goal: RH STG INCREASE KNOWLEDGE OF SELF CARE AFTER HOSPITALIZATION Description: Manage increase  knowledge of self care after hospitalization from partner using educational materials provided Outcome: Progressing

## 2023-12-11 NOTE — Discharge Instructions (Addendum)
 Inpatient Rehab Discharge Instructions  Renee [name] Discharge date and time: No discharge date for patient encounter.   Activities/Precautions/ Functional Status: Activity: As tolerated Diet: Regular Wound Care: Routine skin checks Functional status:  ___ No restrictions     ___ Walk up steps independently ___ 24/7 supervision/assistance   ___ Walk up steps with assistance ___ Intermittent supervision/assistance  ___ Bathe/dress independently ___ Walk with walker     __x_ Bathe/dress with assistance ___ Walk Independently    ___ Shower independently ___ Walk with assistance    ___ Shower with assistance ___ No alcohol      ___ Return to work/school ________  Special Instructions: No driving smoking or alcohol     COMMUNITY REFERRALS UPON DISCHARGE:    Outpatient: PT      OT              Agency: [facility]       Phone: [phone]              Appointment Date/Time:*Please expect follow-up within 7-10 business days to schedule your appointment. If you have not received follow-up, be sure to contact the site directly.*   Medical Equipment/Items Ordered:TUB TRANSFER BENCH, WHEELCHAIR, 3IN1 BEDSIDE COMMODE                                                 Agency/Supplier: [facility] [phone]  GENERAL COMMUNITY RESOURCES FOR PATIENT/FAMILY: IF YOU REQUIRE TRANSPORTATION TO MEDICAL APPOINTMENTS, BE SURE TO CALL [facility] #[phone] ATLEAST 3 BUSINESS DAYS IN ADVANCE.   My questions have been answered and I understand these instructions. I will adhere to these goals and the provided educational materials after my discharge from the hospital.  Patient/Caregiver Signature _______________________________ Date __________  Clinician Signature _______________________________________ Date __________  Please bring this form and your medication list with you to all your follow-up doctor's appointments.

## 2023-12-11 NOTE — Progress Notes (Signed)
 Hypoglycemic Event  CBG: 61  Treatment: 4 oz juice/soda  Symptoms: None  Follow-up CBG: Time:2239 CBG Result:68  Possible Reasons for Event: Unknown  Comments/MD notified:charge nurse aware    Renee [name]

## 2023-12-11 NOTE — Discharge Summary (Signed)
 Physician Discharge Summary   Patient: Renee [name] MRN: [id number] DOB: 06-08-1975  Admit date:     11/25/2023  Discharge date: 12/11/23  Discharge Physician: Owen A Vernee Baines   PCP: Pcp, No   Recommendations at discharge:   Needs Multiples vitamins supplementation.  Continue with Wound care Monitor CBG--she was getting D 10 at a rate of 50cc Follow up Cortisol level.  Needs follow up with Sx post op.  Needs TSH in 2 weeks.   Discharge Diagnoses: Principal Problem:   Hypocalcemia Active Problems:   Hypokalemia   Hypothyroidism   Chronic pain syndrome   Hypertension   PTSD (post-traumatic stress disorder)   Morbid obesity (HCC)   Fibromyalgia   Generalized anxiety disorder   Hyperlipidemia   Hypomagnesemia   Shock (HCC)   Malnutrition of moderate degree   Thrombocytopenia   Major depressive disorder, recurrent severe without psychotic features (HCC)  Resolved Problems:   Bipolar disorder, in partial remission, most recent episode depressed [facility])  Hospital Course: 48 year old with past medical history significant for 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  She was admitted with multiple electrolyte abnormality, including hypocalcemia, hypokalemia, hypomagnesemia, prolonged QT.  Developed sudden abdominal pain earlier in the hospitalization and underwent emergent exploratory laparotomy with placement of wound VAC and closure of internal hernia defect.     Postoperatively she was sedated and continue on vent support, subsequently return to the OR for small bowel resection, jejunostomy tube insertion and closure of abdomen.  Required multiple vasopressors for shock.  Eventually weaned off of pressors and extubated.  Transferred to the hospitalist service 11/7.   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  Status post PJP - Shock resolved, electrolyte has stabilized.  Status postsurgery. - Surgery following and managing. -J tube leaking.  Tube feeding discontinue.  Stable for CIR>    Anemia Thrombocytopenia - Multifactorial, nutritional perioperative, ABLA. - Hemoglobin down to 6 on 11/11 received 1 unit of packed red blood cell.   Continue to monitor.  Resume iron supplement.    Multiple electrolyte abnormality: Hypocalcemia, hypokalemia, hypomagnesemia, hypophosphatemia Micronutrients deficiency including copper , vitamin E, zinc  - Secondary to bariatric surgery - Continue  supplementation - Corrected calcium at 8--on oral  supplementation. Discontinue IV calcium supplementation.  -Magnesium  replaced.    Severe hypothyroidism: - TSH 147 on admission, T3 low T4 very low.  Noncompliant with levothyroxine . - Continue with Synthroid  - Needs repeat TSH in 2 weeks     Moderate malnutrition Overweight - Nutritionist following   PTSD, anxiety, bipolar disorder, ADD, fibromyalgia -unable to sleep at night, started Trazodone.  -Psych consulted, started on paxil/    Hypoglycemia; Currently on D 10 Monitor. Decreasing rate IV fluids.  CBG was low yesterday afternoon, but repeated by random glucose was normal.   Diarrhea;  Nurse report patient is having multiples BM 11/15 Started florastore. Patient afebrile, denies abdominal pain.  Diarrhea improved. Continue florastore.      Complex social issues Questionable adrenal insufficiency - Steroids were considered on her previous [facility]  admission, she needs to follow-up with endocrinologist as an outpatient. -  Cortisol was at 11, 10 days ago.  I have repeated level.  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)       Signs/Symptoms: moderate fat depletion, moderate muscle depletion         Consultants: Surgery  Procedures performed: Sx  Disposition: Rehabilitation facility Diet recommendation:  Discharge Diet Orders (From admission, onward)     Start     Ordered   12/11/23 0000  Diet - low sodium heart healthy        12/11/23 1103           Regular diet DISCHARGE MEDICATION: Allergies as of 12/11/2023       Reactions   Morphine And Codeine Hives, Rash   Penicillins Hives, Rash   Diphenhydramine    Bendrayl-Has opposite effect    Latex Dermatitis   Paper tape ok   Synthroid  [levothyroxine ]    Name brand causes hair loss   Allopurinol Hives, Rash        Medication List     TAKE these medications    amphetamine -dextroamphetamine  30 MG tablet Commonly known as: ADDERALL Take 1 tablet by mouth 2 (two) times daily.   ascorbic acid 500 MG tablet Commonly known as: VITAMIN C Take 1 tablet (500 mg total) by mouth daily. Start taking on: December 12, 2023   calcium citrate 950 (200 Ca) MG tablet Commonly known as: CALCITRATE - dosed in mg elemental calcium Take 1 tablet (950 mg total) by mouth 3 (three) times daily with meals.   copper  tablet Take 2 tablets (4 mg total) by mouth 2 (two) times daily for 5 days, THEN 1 tablet (2 mg total) 3 (three) times daily for 7 days, THEN 2 tablets (4 mg total) daily for 7 days, THEN 1 tablet (2 mg total) daily for 7 days. Start taking on: December 11, 2023   fluticasone  50 MCG/ACT nasal spray Commonly known as: FLONASE  Place 2 sprays into both nostrils daily as needed for allergies.   folic acid  1 MG tablet Commonly known as: FOLVITE  Take 1 tablet (1 mg total) by mouth  daily.   hydrOXYzine  25 MG tablet Commonly known as: ATARAX  Take 1 tablet (25 mg total) by mouth every 6 (six) hours as needed for anxiety.   lactobacillus Pack Take 1 packet (1 g total) by mouth 3 (three) times daily with meals.   levothyroxine  200 MCG tablet Commonly known as: SYNTHROID  TAKE 1 TABLET(200 MCG) BY MOUTH DAILY BEFORE BREAKFAST What changed: See the new instructions.   melatonin 3 MG Tabs tablet Take 1 tablet (3 mg total) by mouth at bedtime.   montelukast  10 MG tablet Commonly known as: SINGULAIR  Take 1 tablet (10 mg total) by mouth at bedtime. What changed:  when to take this reasons to take this   Multi-Vitamin tablet Take by mouth.   omeprazole  40 MG capsule Commonly known as: PRILOSEC Take 1 capsule (40 mg total) by mouth 2 (two) times daily before a meal.  **not covered by insurance**   ondansetron  8 MG disintegrating tablet Commonly known as: ZOFRAN -ODT Take 1 tablet (8 mg total) by mouth 3 (three) times daily as needed.   PARoxetine 10 MG tablet Commonly known as: PAXIL Take 1 tablet (10 mg total) by mouth daily. Start taking on: December 12, 2023   selenium 50 MCG Tabs tablet Take 2 tablets (100 mcg total) by mouth daily.   thiamine  100 MG tablet Commonly known as: VITAMIN B1 Take  1 tablet (100 mg total) by mouth daily.   traZODone 150 MG tablet Commonly known as: DESYREL Take 0.5 tablets (75 mg total) by mouth at bedtime.   vitamin A  3 MG (10000 UNITS) capsule Take 5 capsules (50,000 Units total) by mouth daily for 10 days, THEN 1 capsule (10,000 Units total) daily for 14 days. Start taking on: December 12, 2023   Vitamin D  (Ergocalciferol ) 1.25 MG (50000 UNIT) Caps capsule Commonly known as: DRISDOL  Take 1 capsule (50,000 Units total) by mouth every 7 (seven) days.   vitamin E 180 MG (400 UNITS) capsule Take 1 capsule (400 Units total) by mouth daily.   zinc  sulfate (50mg  elemental zinc ) 220 (50 Zn) MG capsule Take 1 capsule  (220 mg total) by mouth daily.               Discharge Care Instructions  (From admission, onward)           Start     Ordered   12/11/23 0000  Discharge wound care:       Comments: Wound care  Every shift      Comments: Midline abdomen: moist-to-dry BID  11/28/23 1602   12/11/23 1103            Discharge Exam: Filed Weights   12/05/23 0500 12/06/23 0500 12/10/23 0753  Weight: 94.5 kg 93.6 kg 77.5 kg   General; NAD  Condition at discharge: stable  The results of significant diagnostics from this hospitalization (including imaging, microbiology, ancillary and laboratory) are listed below for reference.    CT ABDOMEN PELVIS W CONTRAST Result Date: 11/26/2023 CLINICAL DATA:  Severe abdominal pain, nonlocalized. EXAM: CT ABDOMEN AND PELVIS WITH CONTRAST TECHNIQUE: Multidetector CT imaging of the abdomen and pelvis was performed using the standard protocol following bolus administration of intravenous contrast. RADIATION DOSE REDUCTION: This exam was performed according to the departmental dose-optimization program which includes automated exposure control, adjustment of the mA and/or kV according to patient size and/or use of iterative reconstruction technique. CONTRAST:  OMNIPAQUE  IOHEXOL  300 MG/ML  SOLN COMPARISON:  03/08/2022. FINDINGS: Lower chest: Strandy atelectasis or infiltrate is present at the lung bases. Hepatobiliary: The liver enhances heterogeneously and pneumobilia is noted. Extensive portal venous gas is noted in the liver and portal vein. No biliary ductal dilatation is seen. A 4 mm hyperdense structure is present within the gallbladder. Pancreas: Unremarkable. No pancreatic ductal dilatation or surrounding inflammatory changes. Spleen: Normal in size without focal abnormality. Adrenals/Urinary Tract: The adrenal glands are within normal limits. The kidneys enhance symmetrically. No renal calculus or hydronephrosis bilaterally. The bladder is  unremarkable. Stomach/Bowel: Gastric surgical changes are noted. There is thickening of the walls of the distal esophagus. Multiple loops of dilated small bowel are noted in the abdomen measuring up to 4.8 cm. No definite transition point is seen. Patulous loops of small bowel are noted at anastomotic sites. There is questionable pneumatosis involving the small bowel. Appendix appears normal. A moderate amount of retained stool is present in the colon. Marked thickening of the walls of the rectum are noted. Vascular/Lymphatic: Extensive portal venous gas is noted with gas in the superior mesenteric vein extending into the mesentery. Aorta is normal in caliber. No abdominal or pelvic lymphadenopathy. Reproductive: Uterus and bilateral adnexa are unremarkable. Other: Mild ascites is noted and most pronounced in the perihepatic space. Anasarca is noted. Musculoskeletal: Degenerative changes are present in the thoracolumbar spine. No acute osseous abnormality. IMPRESSION: 1. Multiple dilated loops of small bowel  in the abdomen measuring up to 4.8 cm with possible pneumatosis. No obvious transition point is seen. Findings are concerning for ischemic bowel. 2. Extensive portal venous gas extending into the liver and mesentery. 3. Marked thickening of the walls of the rectum, suggesting proctitis. 4. Thickening of the walls of the distal esophagus, possible infectious or inflammatory esophagitis. 5. Anasarca. Critical Value/emergent results were called by telephone at the time of interpretation on 11/26/2023 at 4:00 pm to provider [facility] , who verbally acknowledged these results. Electronically Signed   By: Leita Birmingham M.D.   On: 11/26/2023 16:00   US  Abdomen Limited RUQ (LIVER/GB) Result Date: 11/26/2023 EXAM: Right Upper Quadrant Abdominal Ultrasound 11/26/2023 09:18:00 AM COMPARISON: US  Abdomen 03/08/2022. CLINICAL HISTORY: Elevated liver enzymes. TECHNIQUE: Real-time ultrasonography of the right upper  quadrant of the abdomen was performed. FINDINGS: LIVER: The liver demonstrates an extremely abnormal appearance. The portal triads are diffusely echogenic and the hepatic parenchyma is heterogeneous. No discrete masses are evident. BILIARY SYSTEM: The gallbladder and bile ducts are obscured. OTHER: No right upper quadrant ascites. IMPRESSION: 1. Extremely abnormal liver appearance with diffusely echogenic portal triads and heterogeneous hepatic parenchyma; no discrete hepatic mass is identified. 2. Gallbladder and bile ducts are obscured. Electronically signed by: Evalene Coho MD 11/26/2023 09:39 AM EST RP Workstation: [id number]    Microbiology: Results for orders placed or performed during the hospital encounter of 11/25/23  MRSA Next Gen by PCR, Nasal     Status: None   Collection Time: 11/25/23 12:06 AM   Specimen: Nasal Mucosa; Nasal Swab  Result Value Ref Range Status   MRSA by PCR Next Gen NOT DETECTED NOT DETECTED Final    Comment: (NOTE) The GeneXpert MRSA Assay (FDA approved for NASAL specimens only), is one component of a comprehensive MRSA colonization surveillance program. It is not intended to diagnose MRSA infection nor to guide or monitor treatment for MRSA infections. Test performance is not FDA approved in patients less than 45 years old. Performed at [facility], [address] [place], [place] [address]   Culture, blood (Routine X 2) w Reflex to ID Panel     Status: None   Collection Time: 11/26/23 10:19 PM   Specimen: BLOOD LEFT HAND  Result Value Ref Range Status   Specimen Description BLOOD LEFT HAND  Final   Special Requests   Final    BOTTLES DRAWN AEROBIC ONLY Blood Culture results may not be optimal due to an inadequate volume of blood received in culture bottles   Culture   Final    NO GROWTH 5 DAYS Performed at [facility], [address], [place], [place] [address]    Report Status 12/02/2023 FINAL  Final  Culture, blood  (Routine X 2) w Reflex to ID Panel     Status: None   Collection Time: 11/26/23 10:19 PM   Specimen: BLOOD LEFT HAND  Result Value Ref Range Status   Specimen Description BLOOD LEFT HAND  Final   Special Requests   Final    BOTTLES DRAWN AEROBIC ONLY Blood Culture results may not be optimal due to an inadequate volume of blood received in culture bottles   Culture   Final    NO GROWTH 5 DAYS Performed at [facility], [address], [place], [place] [address]    Report Status 12/02/2023 FINAL  Final    Labs: CBC: Recent Labs  Lab 12/05/23 0935 12/06/23 0601 12/08/23 0602 12/10/23 1217 12/11/23 0550  WBC 10.0 10.1 7.1 5.8 5.7  HGB 6.8* 8.7* 8.1* 7.9* 7.6*  HCT 20.7* 27.2* 25.8* 25.1* 24.6*  MCV 95.0 97.5 97.0 99.6 101.2*  PLT 154 157 290 448* 454*   Basic Metabolic Panel: Recent Labs  Lab 12/04/23 1801 12/05/23 0401 12/05/23 2006 12/06/23 0601 12/08/23 0602 12/09/23 0819 12/10/23 1217 12/10/23 1655 12/11/23 0550  NA 133* 134*   < > 133* 133* 135 137  --  139  K 5.2* 5.2*   < > 4.8 5.0 3.9 4.0  --  4.2  CL 106 107   < > 105 102 104 109  --  112*  CO2 23 22   < > 24 26 25 22   --  21*  GLUCOSE 84 84   < > 78 80 67* 92 73 83  BUN 9 8   < > 8 8 8 8   --  6  CREATININE 0.36* 0.38*   < > 0.36* 0.46 0.37* 0.39*  --  0.34*  CALCIUM 7.3* 7.2*   < > 6.9* 7.7* 7.1* 7.1*  --  7.3*  MG 1.8 1.7  --  1.7 1.8  --   --   --   --   PHOS 3.3 2.5  --   --  2.5  --   --   --   --    < > = values in this interval not displayed.   Liver Function Tests: Recent Labs  Lab 12/05/23 0401 12/06/23 0601  AST 43* 31  ALT 31 25  ALKPHOS 304* 225*  BILITOT 0.2 0.3  PROT 4.5* 4.7*  ALBUMIN 2.1* 2.1*   CBG: Recent Labs  Lab 12/10/23 1636 12/10/23 1943 12/10/23 2320 12/11/23 0338 12/11/23 0758  GLUCAP 68* 81 85 92 91    Discharge time spent: greater than 30 minutes.  Signed: Owen [name] Lore, MD [facility] 12/11/2023

## 2023-12-11 NOTE — Progress Notes (Signed)
 Inpatient Rehabilitation  Patient information reviewed and entered into eRehab system by [facility]. Karen Kays., CCC/SLP, PPS Coordinator.  Information including medical coding, functional ability and quality indicators will be reviewed and updated through discharge.

## 2023-12-11 NOTE — Progress Notes (Signed)
 Patient observed packing abdominal wound with kleenex tissues. Education provided on infection prevention. Patient encouraged not to undo abdominal dressing and directed to call nursing staff with any questions or concerns.  Wound cleaned and new dressing applied, patient verbalizes understanding of education provided.

## 2023-12-11 NOTE — Progress Notes (Signed)
 Inpatient Rehabilitation Admission Medication Review by a Pharmacist  A complete drug regimen review was completed for this patient to identify any potential clinically significant medication issues.  High Risk Drug Classes Is patient taking? Indication by Medication  Antipsychotic No   Anticoagulant No   Antibiotic No   Opioid Yes Oxycodone  - pain  Antiplatelet No   Hypoglycemics/insulin No   Vasoactive Medication No   Chemotherapy No   Other Yes Zinc , vit E, vit A, selenium, MVI, folic acid , copper , calcium, vit C, lactobacillus - supplementation Trazodone - sleep Paxil - depression Protonix  - GERD Melatonin - sleep Hydroxyzine  - anxiety Synthroid  - hypothyroidism     Type of Medication Issue Identified Description of Issue Recommendation(s)  Drug Interaction(s) (clinically significant)     Duplicate Therapy     Allergy     No Medication Administration End Date     Incorrect Dose     Additional Drug Therapy Needed     Significant med changes from prior encounter (inform family/care partners about these prior to discharge).    Other  Adderall, flonase , singlulair Resume in CIR or at Dc if needed    Clinically significant medication issues were identified that warrant physician communication and completion of prescribed/recommended actions by midnight of the next day:  No  Name of provider notified for urgent issues identified:   Provider Method of Notification:     Pharmacist comments:   Time spent performing this drug regimen review (minutes):  20   Sergio Batch, PharmD, [place], AAHIVP, CPP Infectious Disease Pharmacist 12/11/2023 12:59 PM

## 2023-12-11 NOTE — Progress Notes (Signed)
 PMR Admission Coordinator Pre-Admission Assessment   Patient: Renee [name] is an 48 y.o., female MRN: [id number] DOB: 02/16/1975 Height: 5' 7 (170.2 cm) Weight: 93.6 kg   Insurance Information HMO:     PPO:      PCP:      IPA:      80/20:      OTHER:  PRIMARY: [place] Medicaid [employer] [place]      Policy#:  [id number]           Subscriber: Pt CM Name: UM dept      Phone#: [phone]     Fax#: [phone] Pre-Cert#: [id number]  Received insurance approval on 12/08/23. Pt approved for 8 days beginning 12/08/23-12/15/23. Review date 12/15/23.    Employer:  Benefits:  Phone #:      Name:  Eff. Date: 01/25/2023 to 03/23/2024             Deduct:       Out of Pocket Max:       Life Max:  CIR:       SNF:  Outpatient:      Co-Pay:  Home Health:       Co-Pay:  DME:      Co-Pay:  Providers: In network  SECONDARY:       Policy#:      Phone#:    Artist:       Phone#:    The Data Processing Manager" for patients in Inpatient Rehabilitation Facilities with attached "Privacy Act Statement-Health Care Records" was provided and verbally reviewed with: N/A   Emergency Contact Information Contact Information       Name Relation Home Work [place] Mother [phone]   [phone]         Other Contacts       Name Relation Home Work Mobile    Noblett,Bob Father     [phone]    Jamila, Slatten     [phone]           Current Medical History  Patient Admitting Diagnosis: SBO S/p ex lap History of Present Illness:  Renee [name] is a 48 year old right-handed female with history of hypertension, hyperlipidemia, hypothyroidism, anxiety/bipolar disorder followed by Dr. 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  Presented to [facility] 11/25/2023 with persistent hypokalemia as well as abdominal pain with  admission chemistries unremarkable except WBC 13,400, potassium 2.7, magnesium  1.1.  Noted in the ED blood pressure in the 90-100 systolic.  Patient received 4 g magnesium , 30 mill equivalents IV and 40 mill equivalents potassium started on IV fluids.  CT of the abdomen showed pneumatosis and large portal venous gas.  Underwent diagnostic laparoscopic converted to exploratory laparotomy placement of ABThera VAC closure of internal hernia defect 11/26/2023 per Dr.Tsuei.  Hospital course on 11/3 CVC insertion as well as 11/4 small bowel resection jejunostomy tube insertion closure of abdomen complicated by hemorrhagic shock and electrolyte abnormality..  Hemoglobin dropped down to 6 on 11/11 received 1 unit packed red blood cells increasing to 8.0.  Multiple electrolyte abnormalities hypocalcemia, hypokalemia, hypomagnesemia, hypophosphatemia and maintain on supplementation including IV calcium supplement.  Findings of severe hypothyroidism TSH 147 on admission T3 low T4 very low noted be noncompliant with levothyroxine  and currently continues on Synthroid .  There was some question of possible adrenal insufficiency steroids were considered on a previous admission at [facility] she will need  to follow-up with endocrinologist as an outpatient noted latest cortisol level of 11.  Her diet has been advanced to a regular consistency.  Therapy evaluations completed due to patient's debility was admitted for a comprehensive rehab program.     Patient's medical record from [facility] has been reviewed by the rehabilitation admission coordinator and physician.   Past Medical History      Past Medical History:  Diagnosis Date   ADD (attention deficit disorder with hyperactivity)     Allergy     Anemia     Anxiety and depression      followed by Dr. Vincente and Dwayne at [facility]   Bipolar disorder [facility])      sees Dr. Emilio Vincente   Chronic nausea     Chronic pain     DDD (degenerative disc disease),  cervical 02/24/2015    C4 foraminal narrowing-chronic pain    Dermatitis     Edema     Fibromyalgia     GERD (gastroesophageal reflux disease)     Hearing difficulty     Herpes simplex     Hypertension     Hypothyroidism     Migraines     OSA (obstructive sleep apnea) 01/26/2018    HST 02/08/18 AHI 1.0 is not diagnostic of obstructive sleep apnea   Polyarthralgia     Sepsis secondary to UTI (HCC) 03/09/2022   Skin abnormalities      sees [facility]   Urinary incontinence            Has the patient had major surgery during 100 days prior to admission? Yes   Family History   family history includes Alcohol  abuse in her brother, paternal grandfather, and paternal grandmother; Anxiety disorder in her father and mother; Arthritis in her maternal grandfather; Bipolar disorder in her maternal grandmother; Cancer in her maternal grandmother and mother; Diabetes in her father and paternal grandmother; Drug abuse in her brother; Heart disease in her father; Neuropathy in her mother.   Current Medications  Current Medications    Current Facility-Administered Medications:    acetaminophen  (TYLENOL ) tablet 650 mg, 650 mg, Per Tube, Q6H PRN, Jadine Toribio SQUIBB, MD, 650 mg at 12/07/23 0751   [START ON 12/08/2023] ascorbic acid (VITAMIN C) tablet 500 mg, 500 mg, Oral, BID **FOLLOWED BY** [START ON 12/10/2023] ascorbic acid (VITAMIN C) tablet 500 mg, 500 mg, Oral, Daily, Regalado, Belkys A, MD   calcium citrate (CALCITRATE - dosed in mg elemental calcium) tablet 950 mg, 200 mg of elemental calcium, Oral, TID WC, Regalado, Belkys A, MD, 950 mg at 12/07/23 1241   calcium gluconate 930 mg of elemental calcium in sodium chloride  0.9 % 1,000 mL infusion, 0.5 mg/kg/hr of elemental calcium (Adjusted), Intravenous, Continuous, Jadine Toribio SQUIBB, MD, Last Rate: 38.4 mL/hr at 12/07/23 0500, 0.5 mg/kg/hr of elemental calcium at 12/07/23 0500   [START ON 12/08/2023] copper  tablet 4 mg, 4 mg, Oral, BID  **FOLLOWED BY** [START ON 12/15/2023] copper  tablet 2 mg, 2 mg, Oral, TID **FOLLOWED BY** [START ON 12/22/2023] copper  tablet 4 mg, 4 mg, Oral, Daily **FOLLOWED BY** [START ON 12/29/2023] copper  tablet 2 mg, 2 mg, Oral, Daily, Regalado, Belkys A, MD   dextrose  10 % infusion, , Intravenous, Continuous, Regalado, Belkys A, MD, Last Rate: 50 mL/hr at 12/07/23 0752, Rate Change at 12/07/23 0752   fentaNYL  (SUBLIMAZE ) injection 25-50 mcg, 25-50 mcg, Intravenous, Q2H PRN, Jadine Toribio SQUIBB, MD, 50 mcg at 12/07/23 1241   folic acid  (  FOLVITE ) tablet 1 mg, 1 mg, Oral, Daily, Casimir Camelia RAMAN, RPH, 1 mg at 12/07/23 1003   hydrOXYzine  (ATARAX ) tablet 25 mg, 25 mg, Oral, Q6H PRN, Chavez, Abigail, NP, 25 mg at 12/07/23 1008   influenza vac split trivalent PF (FLUZONE) injection 0.5 mL, 0.5 mL, Intramuscular, Prior to discharge, Seena Marsa NOVAK, MD   levothyroxine  (SYNTHROID ) tablet 200 mcg, 200 mcg, Oral, Q0600, Casimir Camelia RAMAN, RPH, 200 mcg at 12/07/23 0444   melatonin tablet 3 mg, 3 mg, Oral, QHS, Goodrich, Daniel P, MD, 3 mg at 12/06/23 2345   multivitamin with minerals tablet 1 tablet, 1 tablet, Oral, Daily, Jadine Toribio SQUIBB, MD, 1 tablet at 12/06/23 1606   naloxone (NARCAN) injection 0.2-0.4 mg, 0.2-0.4 mg, Intravenous, PRN, Bowser, Grace E, NP, 0.4 mg at 11/29/23 1255   ondansetron  (ZOFRAN ) injection 4 mg, 4 mg, Intravenous, Q6H PRN, Jadine Toribio SQUIBB, MD, 4 mg at 12/06/23 9162   oxyCODONE  (Oxy IR/ROXICODONE ) immediate release tablet 5-10 mg, 5-10 mg, Oral, Q6H PRN, Regalado, Belkys A, MD, 10 mg at 12/07/23 0751   pantoprazole  (PROTONIX ) EC tablet 40 mg, 40 mg, Oral, Daily, Casimir Camelia RAMAN, RPH, 40 mg at 12/07/23 1004   pneumococcal 20-valent conjugate vaccine (PREVNAR 20) injection 0.5 mL, 0.5 mL, Intramuscular, Prior to discharge, Simaan, Elizabeth S, PA-C   protein supplement (ENSURE MAX) liquid, 11 oz, Oral, BID, Regalado, Belkys A, MD   selenium tablet 100 mcg, 100 mcg, Oral, Daily,  Regalado, Belkys A, MD   sodium chloride  flush (NS) 0.9 % injection 3 mL, 3 mL, Intravenous, Q12H, Simaan, Elizabeth S, PA-C, 3 mL at 12/07/23 1004   traZODone (DESYREL) tablet 50 mg, 50 mg, Oral, QHS, Regalado, Belkys A, MD   [COMPLETED] vitamin A  capsule 100,000 Units, 100,000 Units, Oral, Daily, 100,000 Units at 12/07/23 1008 **FOLLOWED BY** [START ON 12/08/2023] vitamin A  capsule 50,000 Units, 50,000 Units, Oral, Daily **FOLLOWED BY** [START ON 12/22/2023] vitamin A  capsule 10,000 Units, 10,000 Units, Oral, Daily, Jadine Toribio SQUIBB, MD   [START ON 12/11/2023] Vitamin D  (Ergocalciferol ) (DRISDOL ) 1.25 MG (50000 UNIT) capsule 50,000 Units, 50,000 Units, Oral, Q7 days, Regalado, Belkys A, MD   vitamin E capsule 400 Units, 400 Units, Oral, Daily, Regalado, Belkys A, MD   [START ON 12/08/2023] zinc  sulfate (50mg  elemental zinc ) capsule 220 mg, 220 mg, Oral, Daily, Regalado, Belkys A, MD     Patients Current Diet:  Diet Order                  Diet regular Room service appropriate? Yes; Fluid consistency: Thin  Diet effective now                         Precautions / Restrictions Precautions Precautions: Fall Precaution/Restrictions Comments: J-tube, abdominal incision Restrictions Weight Bearing Restrictions Per Provider Order: No    Has the patient had 2 or more falls or a fall with injury in the past year? Yes   Prior Activity Level Community (5-7x/wk): Active in the community PTA   Prior Functional Level Self Care: Did the patient need help bathing, dressing, using the toilet or eating? Independent   Indoor Mobility: Did the patient need assistance with walking from room to room (with or without device)? Independent   Stairs: Did the patient need assistance with internal or external stairs (with or without device)? Independent   Functional Cognition: Did the patient need help planning regular tasks such as shopping or remembering to take medications? Independent  Patient  Information Are you of Hispanic, Latino/a,or Spanish origin?: A. No, not of Hispanic, Latino/a, or Spanish origin What is your race?: A. White Do you need or want an interpreter to communicate with a doctor or health care staff?: 0. No   Patient's Response To:  Health Literacy and Transportation Is the patient able to respond to health literacy and transportation needs?: Yes Health Literacy - How often do you need to have someone help you when you read instructions, pamphlets, or other written material from your doctor or pharmacy?: Never In the past 12 months, has lack of transportation kept you from medical appointments or from getting medications?: Yes In the past 12 months, has lack of transportation kept you from meetings, work, or from getting things needed for daily living?: No   Journalist, Newspaper / Equipment Home Equipment: BSC/3in1, Hospital bed, [place] - single point   Prior Device Use: Indicate devices/aids used by the patient prior to current illness, exacerbation or injury? None of the above   Current Functional Level Cognition   Orientation Level: Oriented X4    Extremity Assessment (includes Sensation/Coordination)   Upper Extremity Assessment: Generalized weakness, Right hand dominant RUE Coordination: decreased fine motor, decreased gross motor LUE Coordination: decreased fine motor, decreased gross motor  Lower Extremity Assessment: Defer to PT evaluation RLE Deficits / Details: AROM WFL, strength 3/5 or greater except painful with hip flexion due to abdominal incisions LLE Deficits / Details: AROM WFL, strength 3/5 or greater except painful with hip flexion due to abdominal incisions     ADLs   Overall ADL's : Needs assistance/impaired Eating/Feeding: Set up, Sitting (spoon and cup to mouth EOB) Grooming: Set up, Supervision/safety, Sitting, Wash/dry face Grooming Details (indicate cue type and reason): She performed face washing seated EOB. Upper Body  Bathing: Moderate assistance, Sitting Lower Body Bathing: Total assistance, Bed level Upper Body Dressing : Maximal assistance, Moderate assistance, Sitting Upper Body Dressing Details (indicate cue type and reason): EOB Lower Body Dressing: Maximal assistance, Sitting/lateral leans Toilet Transfer: Minimal assistance, BSC/3in1, Cueing for sequencing, Rolling walker (2 wheels) Toilet Transfer Details (indicate cue type and reason): Pt was instructed on performing a step pivot transfer to bedside commode using a RW. She required cues for walker placement and advancement, optional body positioning, sustained attention to task, and to reach back prior to sitting. Toileting- Clothing Manipulation and Hygiene: Moderate assistance, Sit to/from stand, Cueing for safety Toileting - Clothing Manipulation Details (indicate cue type and reason): She required assist for steadying in standing, mod assist for clothing management, and CGA while she performed anterior peri-hygiene in standing. Toileting management implemented at bedside commode level. Functional mobility during ADLs: Moderate assistance, +2 for physical assistance, Cueing for sequencing (use of rails and bed features for EOB sitting and scooting laterally) General ADL Comments: pain and activity tolerance limited session to EOB activity but significant increase in overall tolerance from IE     Mobility   Overal bed mobility: Needs Assistance Bed Mobility: Rolling Rolling: Min assist Sidelying to sit: Min assist Sit to supine: Min assist General bed mobility comments: verbal cues for technique for log roll, min A to raise trunk, HOB up ~30*     Transfers   Overall transfer level: Needs assistance Equipment used: Rolling walker (2 wheels) Transfers: Sit to/from Stand, Bed to chair/wheelchair/BSC Sit to Stand: Min assist Bed to/from chair/wheelchair/BSC transfer type:: Step pivot Step pivot transfers: Min assist General transfer comment:  verbal cues for hand placement, min A  to power up, sit to stand x 4 trials. Min A from elevated bed, mod A from recliner     Ambulation / Gait / Stairs / Wheelchair Mobility   Ambulation/Gait Ambulation/Gait assistance: Clinical Research Associate (Feet): 3 Feet Assistive device: Rolling walker (2 wheels) Gait Pattern/deviations: Step-to pattern, Decreased stride length General Gait Details: several pivotal steps bed to recliner with RW, distance limited by pain and fatigue Gait velocity: decr     Posture / Balance Dynamic Sitting Balance Sitting balance - Comments: UE support needed or A for balance while at EOB Balance Overall balance assessment: Needs assistance (EOB ADL's then fatigue and pain limited mobility progression) Sitting-balance support: Feet supported, Single extremity supported Sitting balance-Leahy Scale: Fair Sitting balance - Comments: UE support needed or A for balance while at EOB Standing balance support: Bilateral upper extremity supported, During functional activity, Reliant on assistive device for balance Standing balance-Leahy Scale: Poor Standing balance comment: CGA to min assist with RW     Special considerations/life events  Skin abdominal wound, J tube    Previous Home Environment (from acute therapy documentation) Living Arrangements: Spouse/significant other Available Help at Discharge: Family Type of Home: House Home Layout: One level Home Access: Stairs to enter Entrance Stairs-Rails: None Entrance Stairs-Number of Steps: 3 Bathroom Shower/Tub: Engineer, Manufacturing Systems: Standard Home Care Services: No Additional Comments: thinks the [facility] was placed in the attic   Discharge Living Setting Plans for Discharge Living Setting: Patient's home, House Type of Home at Discharge: House Discharge Home Layout: One level Discharge Home Access: Stairs to enter Entrance Stairs-Rails: None Entrance Stairs-Number of Steps: 3 Discharge  Bathroom Shower/Tub: Tub/shower unit Discharge Bathroom Toilet: Standard Discharge Bathroom Accessibility: Yes How Accessible: Accessible via wheelchair, Accessible via walker Does the patient have any problems obtaining your medications?: Yes (Describe) (see CSW consult note)   Social/Family/Support Systems Patient Roles: Partner Contact Information: Ar[place] Just Anticipated Caregiver: [phone] Ability/Limitations of Caregiver: 24/7 Caregiver Availability: 24/7 Discharge Plan Discussed with Primary Caregiver: Yes Is Caregiver In Agreement with Plan?: Yes   Goals Patient/Family Goal for Rehab: PT/OT Mod I Expected length of stay: 7-10 days Pt/Family Agrees to Admission and willing to participate: Yes Program Orientation Provided & Reviewed with Pt/Caregiver Including Roles  & Responsibilities: Yes   Decrease burden of Care through IP rehab admission: Not anticipated   Possible need for SNF placement upon discharge: Not anticipated   Patient Condition: I have reviewed medical records from [facility], spoken with CM, and patient and family member. I met with patient at the bedside for inpatient rehabilitation assessment.  Patient will benefit from ongoing PT and OT, can actively participate in 3 hours of therapy a day 5 days of the week, and can make measurable gains during the admission.  Patient will also benefit from the coordinated team approach during an Inpatient Acute Rehabilitation admission.  The patient will receive intensive therapy as well as Rehabilitation physician, nursing, social worker, and care management interventions.  Due to safety, skin/wound care, disease management, medication administration, pain management, and patient education the patient requires 24 hour a day rehabilitation nursing.  The patient is currently min A with mobility and basic ADLs.  Discharge setting and therapy post discharge at home with home health is anticipated.  Patient has agreed to  participate in the Acute Inpatient Rehabilitation Program and will admit today.   Preadmission Screen Completed By:  Leita KATHEE Kleine, 12/07/2023 1:02 PM ______________________________________________________________________   Discussed status with Dr.  Babs on 12/11/23 at 1039 and received approval for admission today.   Admission Coordinator:  Leita KATHEE Kleine, CCC-SLP, time 1039/Date 12/11/23    Assessment/Plan: Diagnosis: Debility after SBO and exlap Does the need for close, 24 hr/day Medical supervision in concert with the patient's rehab needs make it unreasonable for this patient to be served in a less intensive setting? Yes Co-Morbidities requiring supervision/potential complications: htn, ptsd, add, wound care, anemia Due to bladder management, bowel management, safety, skin/wound care, disease management, medication administration, pain management, and patient education, does the patient require 24 hr/day rehab nursing? Yes Does the patient require coordinated care of a physician, rehab nurse, PT, OT, and SLP to address physical and functional deficits in the context of the above medical diagnosis(es)? Yes Addressing deficits in the following areas: balance, endurance, locomotion, strength, transferring, bowel/bladder control, bathing, dressing, feeding, grooming, toileting, and psychosocial support Can the patient actively participate in an intensive therapy program of at least 3 hrs of therapy 5 days a week? Yes The potential for patient to make measurable gains while on inpatient rehab is excellent Anticipated functional outcomes upon discharge from inpatient rehab: modified independent PT, modified independent OT, n/a SLP Estimated rehab length of stay to reach the above functional goals is: 7-10 days Anticipated discharge destination: Home 10. Overall Rehab/Functional Prognosis: excellent     MD Signature: Arthea IVAR Babs, MD, [facility] Physical Medicine &  Rehabilitation Medical Director Rehabilitation Services 12/11/2023          Revision History  Date/Time User Provider Type Action  12/11/2023 10:45 AM Babs Arthea DASEN, MD Physician Sign  12/11/2023 10:39 AM Kleine Leita KATHEE, CCC-SLP Rehab Admission Coordinator Share  12/08/2023  2:10 PM Yvone Delayne Tinnie SHAUNNA, CCC-SLP Rehab Admission Coordinator Share  12/08/2023  1:19 PM Yvone Delayne Tinnie SHAUNNA, CCC-SLP Rehab Admission Coordinator Share  12/07/2023  2:12 PM Kleine Leita KATHEE RAEJEAN Rehab Admission Coordinator Share  12/07/2023  2:12 PM Kleine Leita KATHEE RAEJEAN Rehab Admission Coordinator Share  12/07/2023  1:15 PM Kleine Leita KATHEE RAEJEAN Rehab Admission Coordinator

## 2023-12-11 NOTE — Progress Notes (Signed)
 Progress Note  13 Days Post-Op  Subjective: Patient reports tolerating regular diet without nausea and vomiting. Pain manageable at this time. Having more formed stools with flatulence. Feels less distended.   ROS  All negative with the exception of above.  Objective: Vital signs in last 24 hours: Temp:  [98.1 F (36.7 C)-99.6 F (37.6 C)] 98.1 F (36.7 C) (11/17 0856) Pulse Rate:  [82-110] 88 (11/17 0856) Resp:  [16-18] 16 (11/17 0856) BP: (95-160)/(63-98) 160/98 (11/17 0856) SpO2:  [98 %-100 %] 100 % (11/17 0856) Last BM Date : 12/11/23  Intake/Output from previous day: 11/16 0701 - 11/17 0700 In: 1106.1 [P.O.:840; I.V.:266.1] Out: -  Intake/Output this shift: Total I/O In: 120 [P.O.:120] Out: -   PE: General: Pleasant female who is laying in bed eating breakfast in NAD. HEENT: Head is normocephalic, atraumatic.   Heart: HR normal during encounter. Lungs: Respiratory effort nonlabored. Abd: Soft, NT, ND. Dressing of abdominal wound clean and dry. No rebound tenderness or guarding. Gastrostomy/Enterostomy Jejunostomy 16 Fr of LUQ. Skin: Warm and dry.   Lab Results:  Recent Labs    12/10/23 1217 12/11/23 0550  WBC 5.8 5.7  HGB 7.9* 7.6*  HCT 25.1* 24.6*  PLT 448* 454*   BMET Recent Labs    12/10/23 1217 12/10/23 1655 12/11/23 0550  NA 137  --  139  K 4.0  --  4.2  CL 109  --  112*  CO2 22  --  21*  GLUCOSE 92 73 83  BUN 8  --  6  CREATININE 0.39*  --  0.34*  CALCIUM 7.1*  --  7.3*   PT/INR No results for input(s): LABPROT, INR in the last 72 hours. CMP     Component Value Date/Time   NA 139 12/11/2023 0550   NA 137 04/22/2019 1634   K 4.2 12/11/2023 0550   CL 112 (H) 12/11/2023 0550   CO2 21 (L) 12/11/2023 0550   GLUCOSE 83 12/11/2023 0550   BUN 6 12/11/2023 0550   BUN 8 04/22/2019 1634   CREATININE 0.34 (L) 12/11/2023 0550   CREATININE 0.73 08/02/2016 1237   CALCIUM 7.3 (L) 12/11/2023 0550   CALCIUM INTEDT 11/25/2023 1529    PROT 4.7 (L) 12/06/2023 0601   PROT 5.4 (L) 04/22/2019 1634   ALBUMIN 2.1 (L) 12/06/2023 0601   ALBUMIN 3.0 (L) 04/22/2019 1634   AST 31 12/06/2023 0601   ALT 25 12/06/2023 0601   ALKPHOS 225 (H) 12/06/2023 0601   BILITOT 0.3 12/06/2023 0601   BILITOT 0.3 04/22/2019 1634   GFRNONAA >60 12/11/2023 0550   GFRAA 80 04/22/2019 1634   Lipase     Component Value Date/Time   LIPASE 82 (H) 03/08/2022 1940       Studies/Results: No results found.  Anti-infectives: Anti-infectives (From admission, onward)    Start     Dose/Rate Route Frequency Ordered Stop   11/26/23 2300  cefTRIAXone  (ROCEPHIN ) 2 g in sodium chloride  0.9 % 100 mL IVPB        2 g 200 mL/hr over 30 Minutes Intravenous Every 24 hours 11/26/23 2210 12/01/23 0028   11/26/23 2300  metroNIDAZOLE  (FLAGYL ) IVPB 500 mg        500 mg 100 mL/hr over 60 Minutes Intravenous Every 12 hours 11/26/23 2210 11/30/23 2347        Assessment/Plan LAPAROTOMY, EXPLORATORY (N/A) - Exploratory Laparotomy 11/26/23 - Tsuei/ Wilson SMALL INTESTINE WITH ANASTOMOSIS SECONDARY CLOSURE OF WOUND AND J TUBE CREATION with Dr. Stevie  11/4. -Afebrile. -WBC 5.7 and HGB 7.6 from 7.9. HGB 8.1 3 days ago. Continue to monitor HGB. -Tolerating regular diet. Continuing to be conscious of intake as TF were discontinued. Pain manageable. No n/v. Having bowel function.  -Progressing towards CIR placement -Will continue to follow.   FEN: Regular VTE: None currently ID: None.   LOS: 16 days   I reviewed hospitalist notes, specialist notes, nursing notes, last 24 h vitals and pain scores, last 48 h intake and output, last 24 h labs and trends, and last 24 h imaging results.   Marjorie Carlyon Favre, [facility] 12/11/2023, 11:04 AM Please see Amion for pager number during day hours 7:00am-4:30pm

## 2023-12-12 DIAGNOSIS — E876 Hypokalemia: Secondary | ICD-10-CM

## 2023-12-12 DIAGNOSIS — R109 Unspecified abdominal pain: Secondary | ICD-10-CM

## 2023-12-12 DIAGNOSIS — D649 Anemia, unspecified: Secondary | ICD-10-CM | POA: Diagnosis not present

## 2023-12-12 DIAGNOSIS — R5381 Other malaise: Secondary | ICD-10-CM | POA: Diagnosis not present

## 2023-12-12 DIAGNOSIS — E162 Hypoglycemia, unspecified: Secondary | ICD-10-CM | POA: Diagnosis not present

## 2023-12-12 LAB — CBC WITH DIFFERENTIAL/PLATELET
Abs Immature Granulocytes: 0.03 K/uL (ref 0.00–0.07)
Basophils Absolute: 0.1 K/uL (ref 0.0–0.1)
Basophils Relative: 1 %
Eosinophils Absolute: 0.1 K/uL (ref 0.0–0.5)
Eosinophils Relative: 2 %
HCT: 24.5 % — ABNORMAL LOW (ref 36.0–46.0)
Hemoglobin: 7.5 g/dL — ABNORMAL LOW (ref 12.0–15.0)
Immature Granulocytes: 1 %
Lymphocytes Relative: 54 %
Lymphs Abs: 3.4 K/uL (ref 0.7–4.0)
MCH: 30.5 pg (ref 26.0–34.0)
MCHC: 30.6 g/dL (ref 30.0–36.0)
MCV: 99.6 fL (ref 80.0–100.0)
Monocytes Absolute: 0.4 K/uL (ref 0.1–1.0)
Monocytes Relative: 7 %
Neutro Abs: 2.1 K/uL (ref 1.7–7.7)
Neutrophils Relative %: 35 %
Platelets: 504 K/uL — ABNORMAL HIGH (ref 150–400)
RBC: 2.46 MIL/uL — ABNORMAL LOW (ref 3.87–5.11)
RDW: 18.6 % — ABNORMAL HIGH (ref 11.5–15.5)
WBC: 6.2 K/uL (ref 4.0–10.5)
nRBC: 0 % (ref 0.0–0.2)

## 2023-12-12 LAB — COMPREHENSIVE METABOLIC PANEL WITH GFR
ALT: 15 U/L (ref 0–44)
AST: 19 U/L (ref 15–41)
Albumin: 1.7 g/dL — ABNORMAL LOW (ref 3.5–5.0)
Alkaline Phosphatase: 127 U/L — ABNORMAL HIGH (ref 38–126)
Anion gap: 4 — ABNORMAL LOW (ref 5–15)
BUN: 6 mg/dL (ref 6–20)
CO2: 21 mmol/L — ABNORMAL LOW (ref 22–32)
Calcium: 7.2 mg/dL — ABNORMAL LOW (ref 8.9–10.3)
Chloride: 111 mmol/L (ref 98–111)
Creatinine, Ser: 0.47 mg/dL (ref 0.44–1.00)
GFR, Estimated: >60 mL/min (ref >60)
Glucose, Bld: 87 mg/dL (ref 70–99)
Potassium: 4.4 mmol/L (ref 3.5–5.1)
Sodium: 136 mmol/L (ref 135–145)
Total Bilirubin: 0.4 mg/dL (ref 0.0–1.2)
Total Protein: 4.8 g/dL — ABNORMAL LOW (ref 6.5–8.1)

## 2023-12-12 LAB — GLUCOSE, CAPILLARY
Glucose-Capillary: 100 mg/dL — ABNORMAL HIGH (ref 70–99)
Glucose-Capillary: 68 mg/dL — ABNORMAL LOW (ref 70–99)
Glucose-Capillary: 72 mg/dL (ref 70–99)
Glucose-Capillary: 78 mg/dL (ref 70–99)
Glucose-Capillary: 83 mg/dL (ref 70–99)
Glucose-Capillary: 85 mg/dL (ref 70–99)
Glucose-Capillary: 97 mg/dL (ref 70–99)

## 2023-12-12 MED ORDER — OXYCODONE HCL 5 MG PO TABS
5.0000 mg | ORAL_TABLET | ORAL | Status: DC | PRN
  Administered 2023-12-12 (×2): 5 mg via ORAL
  Administered 2023-12-13: 10 mg via ORAL
  Administered 2023-12-13: 5 mg via ORAL
  Filled 2023-12-12 (×2): qty 1
  Filled 2023-12-12: qty 2
  Filled 2023-12-12: qty 1

## 2023-12-12 MED ORDER — OXYCODONE HCL 5 MG PO TABS
5.0000 mg | ORAL_TABLET | Freq: Two times a day (BID) | ORAL | Status: DC
Start: 2023-12-12 — End: 2023-12-12

## 2023-12-12 NOTE — Progress Notes (Signed)
 Initial Nutrition Assessment  DOCUMENTATION CODES:   Severe malnutrition in context of social or environmental circumstances  INTERVENTION:  Continue micronutrient supplementation for deficiencies and recheck labs as scheduled (see below in note for outlined specifics) Continue MVI w/ minerals daily  Ensure Max po BID, each supplement provides 150 kcal and 30 grams of protein.   Encouraged adequate intake of meals and supplements to meet calorie and protein needs Encouraged small, frequent meals due to hx of gastric bypass/duodenal switch  NUTRITION DIAGNOSIS:   Severe Malnutrition related to social / environmental circumstances as evidenced by severe muscle depletion, severe fat depletion.  GOAL:   Patient will meet greater than or equal to 90% of their needs  MONITOR:   PO intake, Supplement acceptance  REASON FOR ASSESSMENT:   Diagnosis Other (Comment) (malnutrition; continuity of care)  ASSESSMENT:   48 y.o. 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 Recent admission at [facility] 11/1-11/17 for electrolyte abnormalities, hypocalcemia, and concern for ischemic bowel/hernia. Pt s/p small bowel resection (10 cm) and J tube placement 11/4. Hospital course complicated by leaking J tube which is no longer being used for tube feeds; also complicated by multiple vitamin and mineral deficiencies requiring repletion. Admitted to CIR for debility.  Pt previously seen at [facility] by RD team prior to rehab admission. During that time, RD team checked multiple micronutrient labs and found significant deficiencies present in pt. Repletion has been ordered for vitamin D , vitamin A , vitamin E, zinc , copper , selenium, and vitamin C. Continuing care while pt is in rehab and will recheck labs as supplementation for each  micronutrient is complete. 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 Pt has J tube present on admission but it has not been in use recently due to leak. Surgery continues to follow pt but recommending not using J tube and encouraging PO intake instead.   Spoke with pt and pt's sister at bedside. Pt reports reasoning for noncompliance was due to her not realizing medications were covered by insurance. Pt struggles with financial strains and food insecurity which have been main barriers to pt's health. Pt reports she had not been eating well for some time due to losing EBT benefits during government shutdown. Since admission, pt reports appetite is improving but she still feels like she has early satiety and is unable to each much. Pt reports she does drink Ensure Max shakes in between meals and will take small bites of food at each meal to help intake. Discussed continued small, frequent meal intake to help meet needs and continued compliance with vitamin and mineral supplementation throughout rehab admission. Pt continues to have issues with hypoglycemic episodes likely related to poor PO intake. Encouraged consistent intake to avoid hypoglycemia.    Micronutrient Profile: results from 11/3 - CRP: 3.4 (CRP level can impact interpretation of micronutrient levels but current level is not significant enough to impact results) - Vitamin B 12, thiamine , Vitamin K, and iron (all within normal limits) - Vitamin D : < 4.2 (LOW) -  50,000 units weekly x8 weeks then re-check (January 2026) - Vitamin A : 2.6 (LOW)  100,000 international units daily x3 days (completed) followed by 50,000 international units daily x14 days followed by 10,000 international units daily x14 days then re-check (December 3-5th) - Vitamin E: 3.0 (LOW) -  400 units daily x30 days then re-check (December 15th) - Zinc : 20 (LOW) - 220mg  daily x14 days then  re-check (November 28th) - Copper : 29 (LOW) - 8mg  copper  daily x1 week followed by 6mg  copper  daily x1 week followed by 4mg  copper  daily x1 week then 2mg  daily x1 week then re-check (December 15th) - Selenium: 52 (LOW) - 100mcg daily x30 days then re-check (December 15th) - Vitamin C: 0.3 (LOW) - 500mg  BID x2 days followed by 500mg  daily x1 week then re-check (November 24th)  Other pertinent labs: CBG x 24 h: 61-85 mg/dL  Other nutrition related medications: Folic acid  1 mg daily Floranex TID Levothyroxine   MVI w/ minerals Protonix  Dextrose  10%   NUTRITION - FOCUSED PHYSICAL EXAM:  Flowsheet Row Most Recent Value  Orbital Region Severe depletion  Upper Arm Region Moderate depletion  Thoracic and Lumbar Region Unable to assess  Buccal Region Severe depletion  Temple Region Severe depletion  Clavicle Bone Region Severe depletion  Clavicle and Acromion Bone Region Severe depletion  Scapular Bone Region Severe depletion  Dorsal Hand Severe depletion  Patellar Region Moderate depletion  Anterior Thigh Region Moderate depletion  Posterior Calf Region Moderate depletion  Edema (RD Assessment) None  Hair Reviewed  Eyes Reviewed  Mouth Reviewed  Skin Reviewed  Nails Reviewed    Diet Order:   Diet Order             Diet regular Room service appropriate? Yes; Fluid consistency: Thin  Diet effective now                   EDUCATION NEEDS:   Education needs have been addressed  Skin:  Skin Assessment: Skin Integrity Issues: Skin Integrity Issues:: Incisions Incisions: abdomen  Last BM:  11/17 type 7  Height:   Ht Readings from Last 1 Encounters:  12/12/23 5' 7 (1.702 m)    Weight:   Wt Readings from Last 1 Encounters:  12/12/23 67.9 kg    Ideal Body Weight:  61.4 kg  BMI:  Body mass index is 23.45 kg/m.  Estimated Nutritional Needs:   Kcal:  1900-2100  Protein:  85-105g  Fluid:  >/= 2L    Josette Glance, MS, RDN, LDN Clinical Dietitian  I Please reach out via secure chat

## 2023-12-12 NOTE — Plan of Care (Signed)
  Problem: Consults Goal: RH GENERAL PATIENT EDUCATION Description: See Patient Education module for education specifics. Outcome: Progressing   Problem: RH BOWEL ELIMINATION Goal: RH STG MANAGE BOWEL WITH ASSISTANCE Description: STG Manage Bowel with mod I Assistance. Outcome: Progressing   Problem: RH BLADDER ELIMINATION Goal: RH STG MANAGE BLADDER WITH ASSISTANCE Description: STG Manage Bladder With mod I Assistance Outcome: Progressing   Problem: RH SKIN INTEGRITY Goal: RH STG SKIN FREE OF INFECTION/BREAKDOWN Description: Manage skin free of infection with mod I assistance Outcome: Progressing   Problem: RH SAFETY Goal: RH STG ADHERE TO SAFETY PRECAUTIONS W/ASSISTANCE/DEVICE Description: STG Adhere to Safety Precautions With mod I Assistance/Device. Outcome: Progressing   Problem: RH PAIN MANAGEMENT Goal: RH STG PAIN MANAGED AT OR BELOW PT'S PAIN GOAL Description: <4 w/ prns Outcome: Progressing   Problem: RH KNOWLEDGE DEFICIT GENERAL Goal: RH STG INCREASE KNOWLEDGE OF SELF CARE AFTER HOSPITALIZATION Description: Manage increase  knowledge of self care after hospitalization from partner using educational materials provided Outcome: Progressing

## 2023-12-12 NOTE — Progress Notes (Signed)
 PCP established with [facility] health [facility] and adult Medicine.
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
## 2023-12-12 NOTE — Progress Notes (Signed)
 Patient ID: Renee [name], female   DOB: 12-17-1975, 48 y.o.   MRN: [id number]  SW SW met with pt in room to provide updates from team conference,and shared will follow-up when the team confirms her d/c date.   Team reports d/c date 11/24.   SW met with pt in room to inform on above.She is aware SW will follow-up with s/o Renee [name].   1500-SW spoke with pt s/o Renee [name] to discuss above. Fam edu on Thursday 8am-11am. He would like to know if she can be intermittent supervision when she goes home as he has to work as well. SW shared will likely be Mod I but will confirm with medical team.   Graeme [name], MSW, LCSW Office: [phone] Cell: [phone] Fax: [phone]

## 2023-12-12 NOTE — Progress Notes (Addendum)
 PROGRESS NOTE   Subjective/Complaints: Continued pain in her abdomen, medications are helping.  She is little nervous that she will not be able to do enough with therapy because of her pain.  Mild hypoglycemia yesterday.  ROS: Patient denies fever, new vision changes, dizziness, nausea, vomiting, diarrhea,  shortness of breath or chest pain, headache, or mood change.    Objective:   No results found. Recent Labs    12/11/23 0550 12/12/23 0509  WBC 5.7 6.2  HGB 7.6* 7.5*  HCT 24.6* 24.5*  PLT 454* 504*   Recent Labs    12/11/23 0550 12/12/23 0509  NA 139 136  K 4.2 4.4  CL 112* 111  CO2 21* 21*  GLUCOSE 83 87  BUN 6 6  CREATININE 0.34* 0.47  CALCIUM 7.3* 7.2*    Intake/Output Summary (Last 24 hours) at 12/12/2023 1332 Last data filed at 12/11/2023 1800 Gross per 24 hour  Intake 120 ml  Output --  Net 120 ml        Physical Exam: Vital Signs Blood pressure 108/73, pulse 95, temperature 98.7 F (37.1 C), temperature source Oral, resp. rate 19, height 5' 7 (1.702 m), weight 67.9 kg, last menstrual period 03/02/2021, SpO2 97%.  General: No apparent distress HEENT: Head is normocephalic, atraumatic, MMM Heart: Reg rate and rhythm. Chest: CTA bilaterally , non-labored Abdomen: Soft,  Abdominal wound dressing CDI, J-tube site nontender Extremities: Mild bilateral lower extremity edema Psych: Anxious Skin: Clean and intact without signs of breakdown Neuro: Alert and awake, cranial nerves II through XII grossly intact, follows commands, strength 4 out of 5 in bilateral upper and lower extremities, sensation intact in bilateral upper and lower extremities to light touch MSK.  No joint swelling noted    Assessment/Plan: 1. Functional deficits which require 3+ hours per day of interdisciplinary therapy in a comprehensive inpatient rehab setting. Physiatrist is providing close team supervision and 24 hour  management of active medical problems listed below. Physiatrist and rehab team continue to assess barriers to discharge/monitor patient progress toward functional and medical goals  Care Tool:  Bathing    Body parts bathed by patient: Right arm, Left arm, Chest, Abdomen, Front perineal area, Buttocks, Right upper leg, Left upper leg, Face   Body parts bathed by helper: Right lower leg, Left lower leg     Bathing assist Assist Level: Minimal Assistance - Patient > 75%     Upper Body Dressing/Undressing Upper body dressing   What is the patient wearing?: Pull over shirt    Upper body assist Assist Level: Set up assist    Lower Body Dressing/Undressing Lower body dressing      What is the patient wearing?: Underwear/pull up, Pants     Lower body assist Assist for lower body dressing: Contact Guard/Touching assist     Toileting Toileting    Toileting assist Assist for toileting: Minimal Assistance - Patient > 75%     Transfers Chair/bed transfer  Transfers assist     Chair/bed transfer assist level: Supervision/Verbal cueing     Locomotion Ambulation   Ambulation assist      Assist level: Supervision/Verbal cueing Assistive device: Walker-rolling Max distance: 125'  Walk 10 feet activity   Assist     Assist level: Supervision/Verbal cueing     Walk 50 feet activity   Assist    Assist level: Supervision/Verbal cueing      Walk 150 feet activity   Assist Walk 150 feet activity did not occur: Safety/medical concerns         Walk 10 feet on uneven surface  activity   Assist Walk 10 feet on uneven surfaces activity did not occur: Safety/medical concerns         Wheelchair     Assist Is the patient using a wheelchair?: Yes Type of Wheelchair: Manual    Wheelchair assist level: Dependent - Patient 0%      Wheelchair 50 feet with 2 turns activity    Assist        Assist Level: Dependent - Patient 0%    Wheelchair 150 feet activity     Assist      Assist Level: Dependent - Patient 0%   Blood pressure 108/73, pulse 95, temperature 98.7 F (37.1 C), temperature source Oral, resp. rate 19, height 5' 7 (1.702 m), weight 67.9 kg, last menstrual period 03/02/2021, SpO2 97%.   Medical Problem List and Plan: 1. Functional deficits secondary to debility secondary to mesenteric ischemia/pseudo Petersons internal hernia status post emergent  laparotomy,placement of wound VAC and closure of internal hernia defect 11/26/2023 with CVC insertion 11/3 as well as 11/4 small bowel resection jejunostomy tube insertion closure of abdomen             -patient may not shower             -ELOS/Goals: 7-10d Mod I  - Continue CIR 2.  Antithrombotics: -DVT/anticoagulation:  Mechanical: Antiembolism stockings, thigh (TED hose) Bilateral lower extremities             -antiplatelet therapy: N/A 3. Pain Management/PTSD/bipolar: Oxycodone  as needed, consider adding scheduled pain medication  -hx of fibromyalgia  -11/18 increase oxycodone  PRN frequency to Q4h after discussion with patient 4. Mood/Behavior/Sleep: Paxil 10 mg daily, melatonin 3 mg nightly, trazodone 75 mg daily, Atarax  25 mg every 6 hours as needed anxiety             -antipsychotic agents: Provide emotional support 5. Neuropsych/cognition: This patient is capable of making decisions on her own behalf. 6. Skin/Wound Care: Routine skin checks 7. Fluids/Electrolytes/Nutrition: Routine in and outs with follow-up chemistries 8.  Hemorrhagic shock.  Received 1 unit packed red blood cells 11/11.  Follow-up CBC 9.  Multiple electrolyte abnormalities/hypocalcemia/hypokalemia/hypomagnesemia/hypophosphatemia.  Supplements ongoing follow-up chemistries -11/18 sodium and potassium stable, continue calcium supplementation 10.  Severe hypothyroidism.  TSH 147 on admission.  Noncompliant with levothyroxine .  Continue Synthroid .  Follow-up TSH 2 weeks Has  coarse skin, peripheral edema but no severe polyneuropathy  11.  Moderate malnutrition.  Dietary follow-up 12.  Questionable adrenal insufficiency.  Steroids were considered on previous [facility] admission.  She would need follow-up outpatient endocrinology as an outpatient. 13.  Hypoglycemia.  Continue to encourage p.o. intake 14.  Anemia/thrombocytosis  - 11/18 hemoglobin 7.5 today, platelets 504 continue to monitor trend    LOS: 1 days A FACE TO FACE EVALUATION WAS PERFORMED  Murray Collier 12/12/2023, 1:32 PM

## 2023-12-12 NOTE — Progress Notes (Signed)
 Hypoglycemic Event  CBG: 68  Treatment: 4 oz juice/soda  Symptoms: None  Follow-up CBG: Time:   CBG Result:68,72  Possible Reasons for Event: Unknown  Comments/MD notified:standing order put in by RN.    Geni [name] Armor

## 2023-12-12 NOTE — Progress Notes (Signed)
 Occupational Therapy Session Note  Patient Details  Name: Renee [name] MRN: [id number] Date of Birth: May 21, 1975  Today's Date: 12/12/2023 OT Individual Time: 1440-1530 OT Individual Time Calculation (min): 50 min  and Today's Date: 12/12/2023 (ACTUAL TIME IN SESSION 1420-1530) OT Missed Time: 20 Minutes Missed Time Reason: Nursing care   Short Term Goals: Week 1:  OT Short Term Goal 1 (Week 1): STGs=LTGs due to ELOS.  Skilled Therapeutic Interventions/Progress Updates:  Pt greeted attempting to toilet (NT present), requests time to void. Upon OT re-entry, patient observed attempting to perform pericare. Min A provided for garment management. Pt performs stand-pivot from BSC>EOB with CGA + no AD. Pt reports increased frustration with recent events (needing to wait for assistance to toilet, disliking her discharge date, etc.). OT provides therapeutic support for comfort. Pt requests to change abdominal wound dressings and UB garments in anticipation of visit from fiance. Mod A provided for care to maintain IV integrity on L-hand. Pt then missing ~20 mins of skilled intervention to allow nursing care. Upon OT return, time dedicated to fulfilling evaluation requirements and providing education on discharge planning to manage increased frustration/anxiety. Pt remains resting in bed with all immediate needs met.   Therapy Documentation Precautions:  Precautions Precautions: Fall Recall of Precautions/Restrictions: Intact Precaution/Restrictions Comments: J-tube, abdominal incision Restrictions Weight Bearing Restrictions Per Provider Order: No   Therapy/Group: Individual Therapy  [name], OTR/L, MSOT  12/12/2023, 2:42 PM

## 2023-12-12 NOTE — Progress Notes (Signed)
 Occupational Therapy Note  Patient Details  Name: Renee [name] MRN: [id number] Date of Birth: 1975/02/26   Occupational Therapist participated in the interdisciplinary team conference, providing clinical information regarding the patient's current status, treatment goals, and weekly focus, including any barriers that need to be addressed. Please see the Inpatient Rehabilitation Team Conference and Plan of Care Update for further details.   [name], OTR/L, MSOT  12/12/2023, 3:54 PM

## 2023-12-12 NOTE — Plan of Care (Signed)
  Problem: RH Balance Goal: LTG Patient will maintain dynamic standing balance (PT) Description: LTG:  Patient will maintain dynamic standing balance with assistance during mobility activities (PT) Flowsheets (Taken 12/12/2023 1601) LTG: Pt will maintain dynamic standing balance during mobility activities with:: Independent with assistive device    Problem: Sit to Stand Goal: LTG:  Patient will perform sit to stand with assistance level (PT) Description: LTG:  Patient will perform sit to stand with assistance level (PT) Flowsheets (Taken 12/12/2023 1601) LTG: PT will perform sit to stand in preparation for functional mobility with assistance level: Independent with assistive device   Problem: RH Bed Mobility Goal: LTG Patient will perform bed mobility with assist (PT) Description: LTG: Patient will perform bed mobility with assistance, with/without cues (PT). Flowsheets (Taken 12/12/2023 1601) LTG: Pt will perform bed mobility with assistance level of: Independent with assistive device    Problem: RH Bed to Chair Transfers Goal: LTG Patient will perform bed/chair transfers w/assist (PT) Description: LTG: Patient will perform bed to chair transfers with assistance (PT). Flowsheets (Taken 12/12/2023 1601) LTG: Pt will perform Bed to Chair Transfers with assistance level: Independent with assistive device    Problem: RH Car Transfers Goal: LTG Patient will perform car transfers with assist (PT) Description: LTG: Patient will perform car transfers with assistance (PT). Flowsheets (Taken 12/12/2023 1601) LTG: Pt will perform car transfers with assist:: Supervision/Verbal cueing   Problem: RH Ambulation Goal: LTG Patient will ambulate in controlled environment (PT) Description: LTG: Patient will ambulate in a controlled environment, # of feet with assistance (PT). Flowsheets (Taken 12/12/2023 1601) LTG: Pt will ambulate in controlled environ  assist needed:: Independent with assistive  device LTG: Ambulation distance in controlled environment: 150' Goal: LTG Patient will ambulate in home environment (PT) Description: LTG: Patient will ambulate in home environment, # of feet with assistance (PT). Flowsheets (Taken 12/12/2023 1601) LTG: Pt will ambulate in home environ  assist needed:: Independent with assistive device LTG: Ambulation distance in home environment: 50'   Problem: RH Stairs Goal: LTG Patient will ambulate up and down stairs w/assist (PT) Description: LTG: Patient will ambulate up and down # of stairs with assistance (PT) Flowsheets (Taken 12/12/2023 1601) LTG: Pt will ambulate up/down stairs assist needed:: Minimal Assistance - Patient > 75% LTG: Pt will  ambulate up and down number of stairs: 3 steps without handrails to simulate home environment

## 2023-12-12 NOTE — Progress Notes (Signed)
 Physical Therapy Note  Patient Details  Name: Renee [name] MRN: [id number] Date of Birth: [birth year] Today's Date: 12/12/2023    Physical Therapist participated in the interdisciplinary team conference, providing clinical information regarding the patient's current status, treatment goals, and weekly focus, including any barriers that need to be addressed. Please see the Inpatient Rehabilitation Team Conference and Plan of Care Update for further details.    Reche Ohara PT, DPT 12/12/2023, 10:53 AM

## 2023-12-12 NOTE — Plan of Care (Signed)
  Problem: RH Balance Goal: LTG Patient will maintain dynamic standing with ADLs (OT) Description: LTG:  Patient will maintain dynamic standing balance with assist during activities of daily living (OT)  Flowsheets (Taken 12/12/2023 1207) LTG: Pt will maintain dynamic standing balance during ADLs with: Independent with assistive device   Problem: RH Bathing Goal: LTG Patient will bathe all body parts with assist levels (OT) Description: LTG: Patient will bathe all body parts with assist levels (OT) Flowsheets (Taken 12/12/2023 1207) LTG: Pt will perform bathing with assistance level/cueing: Independent with assistive device    Problem: RH Dressing Goal: LTG Patient will perform lower body dressing w/assist (OT) Description: LTG: Patient will perform lower body dressing with assist, with/without cues in positioning using equipment (OT) Flowsheets (Taken 12/12/2023 1207) LTG: Pt will perform lower body dressing with assistance level of: Independent with assistive device   Problem: RH Toileting Goal: LTG Patient will perform toileting task (3/3 steps) with assistance level (OT) Description: LTG: Patient will perform toileting task (3/3 steps) with assistance level (OT)  Flowsheets (Taken 12/12/2023 1207) LTG: Pt will perform toileting task (3/3 steps) with assistance level: Independent with assistive device   Problem: RH Toilet Transfers Goal: LTG Patient will perform toilet transfers w/assist (OT) Description: LTG: Patient will perform toilet transfers with assist, with/without cues using equipment (OT) Flowsheets (Taken 12/12/2023 1207) LTG: Pt will perform toilet transfers with assistance level of: Independent with assistive device   Problem: RH Tub/Shower Transfers Goal: LTG Patient will perform tub/shower transfers w/assist (OT) Description: LTG: Patient will perform tub/shower transfers with assist, with/without cues using equipment (OT) Flowsheets (Taken 12/12/2023 1207) LTG: Pt  will perform tub/shower stall transfers with assistance level of: Independent with assistive device

## 2023-12-12 NOTE — Patient Care Conference (Signed)
 Inpatient RehabilitationTeam Conference and Plan of Care Update Date: 12/12/2023   Time: 1052 am    Patient Name: Renee [name]      Medical Record Number: [id number]  Date of Birth: 04-06-1975 Sex: Female         Room/Bed: 4W08C/4W08C-01 Payor Info: Payor: [place] MEDICAID PREPAID HEALTH PLAN / Plan: [place] MEDICAID [employer] [place] / Product Type: *No Product type* /    Admit Date/Time:  12/11/2023 12:22 PM  Primary Diagnosis:  Debility  Hospital Problems: Principal Problem:   Debility    Expected Discharge Date: Expected Discharge Date: 12/18/23  Team Members Present: Physician leading conference: Dr. Arthea Gunther Social Worker Present: Graeme [name], LCSW Nurse Present: Eulalio Falls, RN PT Present: Catilin Osborn, PT OT Present: [name], OT SLP Present: Recardo Mole, SLP PPS Coordinator present : Eleanor Colon, SLP     Current Status/Progress Goal Weekly Team Focus  Bowel/Bladder   Continent B/B  LBM 12/11/23   Will remain continent with normal pattern   Assist with toileting needs q4hrs while awake    Swallow/Nutrition/ Hydration               ADL's   Setup A for UB ADLs, Min A for LB ADLs. CGA for ambulatory transfers. Barriers: Decreased activity tolerance and generalized weakness.   Mod I   General conditioning and AE for LB to promote ab wound.    Mobility   eval pending           Communication                Safety/Cognition/ Behavioral Observations               Pain   Endorses pain to abdominal incision 8/10. Pain being manage with PRN Oxycodone  10 mg   Will verbalizes pain level <3   Assess pt for pain qshift/prn and medicate per provider's order.    Skin   Surgical incision to abdomen.(Wet to Dry dressing)   Will be free from infection  Assess surgical site for s/s of infection and provide education to prevent infection      Discharge Planning:  TBA    Team Discussion: *** Patient on target to meet  rehab goals: {IP REHAB YES/NO WITH TPOIRJMID:[id number]}  *See Care Plan and progress notes for long and short-term goals.   Revisions to Treatment Plan:  ***  Teaching Needs: ***  Current Barriers to Discharge: {BARRIERS TO [id number]}  Possible Resolutions to Barriers: ***     Medical Summary Current Status: mesenteric ischemia/pseudo Petersons internal hernia, insomnia, bipolar disorder, hypothyroidism, malnutrition, possible adrenal insufficiency, anemia  Barriers to Discharge: Behavior/Mood;Self-care education;Complicated Wound;Inadequate Nutritional Intake;Medical stability;Electrolyte abnormality  Barriers to Discharge Comments: mesenteric ischemia/pseudo Petersons internal hernia, insomnia, bipolar disorder, hypothyroidism, malnutrition, possible adrenal insufficiency, anemia Possible Resolutions to [employer] Focus: Monitor BMP, encourance nutrition/dietary f/u, monitor CBC, neurophych   Continued Need for Acute Rehabilitation Level of Care: The patient requires daily medical management by a physician with specialized training in physical medicine and rehabilitation for the following reasons: Direction of a multidisciplinary physical rehabilitation program to maximize functional independence : Yes Medical management of patient stability for increased activity during participation in an intensive rehabilitation regime.: Yes Analysis of laboratory values and/or radiology reports with any subsequent need for medication adjustment and/or medical intervention. : Yes   I attest that I was present, lead the team conference, and concur with the assessment and plan of the team.   Anaih Brander Gayo 12/12/2023,  1052 am
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
## 2023-12-13 LAB — GLUCOSE, CAPILLARY
Glucose-Capillary: 89 mg/dL (ref 70–99)
Glucose-Capillary: 82 mg/dL (ref 70–99)
Glucose-Capillary: 67 mg/dL — ABNORMAL LOW (ref 70–99)
Glucose-Capillary: 69 mg/dL — ABNORMAL LOW (ref 70–99)
Glucose-Capillary: 77 mg/dL (ref 70–99)
Glucose-Capillary: 82 mg/dL (ref 70–99)

## 2023-12-13 MED ORDER — OXYCODONE HCL 5 MG PO TABS
10.0000 mg | ORAL_TABLET | ORAL | Status: DC | PRN
Start: 2023-12-13 — End: 2023-12-18
  Administered 2023-12-13 – 2023-12-18 (×17): 10 mg via ORAL
  Filled 2023-12-13 (×17): qty 2

## 2023-12-13 MED ORDER — BARRIER CREAM NON-SPECIFIED: 1.0000 | TOPICAL_CREAM | Freq: Two times a day (BID) | TOPICAL | Status: DC | PRN

## 2023-12-13 NOTE — Progress Notes (Addendum)
 PROGRESS NOTE      Subjective/Complaints: Reports pain was increased yesterday because nursing gave her a lower dose of pain medications due to her blood pressure being soft.  When she gets 10 mg dose of oxycodone  pain is better controlled.  ROS: Patient denies fever, new vision changes, dizziness, nausea, vomiting, diarrhea,  shortness of breath or chest pain, headache, or mood change. + Continued abdominal pain   Objective:   No results found. Recent Labs    12/11/23 0550 12/12/23 0509  WBC 5.7 6.2  HGB 7.6* 7.5*  HCT 24.6* 24.5*  PLT 454* 504*   Recent Labs    12/11/23 0550 12/12/23 0509  NA 139 136  K 4.2 4.4  CL 112* 111  CO2 21* 21*  GLUCOSE 83 87  BUN 6 6  CREATININE 0.34* 0.47  CALCIUM  7.3* 7.2*    Intake/Output Summary (Last 24 hours) at 12/13/2023 1152 Last data filed at 12/13/2023 0600 Gross per 24 hour  Intake 2266.16 ml  Output --  Net 2266.16 ml        Physical Exam: Vital Signs Blood pressure 97/68, pulse 66, temperature 97.8 F (36.6 C), temperature source Oral, resp. rate 17, height 5' 7 (1.702 m), weight 67.9 kg, last menstrual period 03/02/2021, SpO2 100%.  General: No apparent distress, sitting up in bed HEENT: Head is normocephalic, atraumatic, MMM Heart: Reg rate and rhythm. Chest: CTA bilaterally , non-labored Abdomen: Soft,  Abdominal wound dressing CDI, J-tube site with minimal tenderness Extremities: Mild bilateral lower extremity edema Psych: Little anxious again today Skin: Clean and intact without signs of breakdown Neuro: Alert and awake, cranial nerves II through XII grossly intact, follows commands, strength 4 out of 5 in bilateral upper and lower extremities, sensation intact in bilateral upper and lower extremities to light touch MSK.  No joint swelling noted    Assessment/Plan: 1. Functional deficits which require 3+ hours per day of interdisciplinary therapy  in a comprehensive inpatient rehab setting. Physiatrist is providing close team supervision and 24 hour management of active medical problems listed below. Physiatrist and rehab team continue to assess barriers to discharge/monitor patient progress toward functional and medical goals  Care Tool:  Bathing    Body parts bathed by patient: Right arm, Left arm, Chest, Abdomen, Front perineal area, Buttocks, Right upper leg, Left upper leg, Face   Body parts bathed by helper: Right lower leg, Left lower leg     Bathing assist Assist Level: Minimal Assistance - Patient > 75%     Upper Body Dressing/Undressing Upper body dressing   What is the patient wearing?: Pull over shirt    Upper body assist Assist Level: Set up assist    Lower Body Dressing/Undressing Lower body dressing      What is the patient wearing?: Underwear/pull up, Pants     Lower body assist Assist for lower body dressing: Contact Guard/Touching assist     Toileting Toileting    Toileting assist Assist for toileting: Minimal Assistance - Patient > 75%     Transfers Chair/bed transfer  Transfers assist     Chair/bed transfer assist level: Supervision/Verbal cueing     Locomotion Ambulation  Ambulation assist      Assist level: Supervision/Verbal cueing Assistive device: Walker-rolling Max distance: 125'   Walk 10 feet activity   Assist     Assist level: Supervision/Verbal cueing     Walk 50 feet activity   Assist    Assist level: Supervision/Verbal cueing      Walk 150 feet activity   Assist Walk 150 feet activity did not occur: Safety/medical concerns         Walk 10 feet on uneven surface  activity   Assist Walk 10 feet on uneven surfaces activity did not occur: Safety/medical concerns         Wheelchair     Assist Is the patient using a wheelchair?: Yes Type of Wheelchair: Manual    Wheelchair assist level: Dependent - Patient 0%      Wheelchair  50 feet with 2 turns activity    Assist        Assist Level: Dependent - Patient 0%   Wheelchair 150 feet activity     Assist      Assist Level: Dependent - Patient 0%   Blood pressure 97/68, pulse 66, temperature 97.8 F (36.6 C), temperature source Oral, resp. rate 17, height 5' 7 (1.702 m), weight 67.9 kg, last menstrual period 03/02/2021, SpO2 100%.   Medical Problem List and Plan: 1. Functional deficits secondary to debility secondary to mesenteric ischemia/pseudo Petersons internal hernia status post emergent  laparotomy,placement of wound VAC and closure of internal hernia defect 11/26/2023 with CVC insertion 11/3 as well as 11/4 small bowel resection jejunostomy tube insertion closure of abdomen             -patient may not shower             -ELOS/Goals: 7-10d Mod I  - Continue CIR  - Expected discharge 11/24 2.  Antithrombotics: -DVT/anticoagulation:  Mechanical: Antiembolism stockings, thigh (TED hose) Bilateral lower extremities             -antiplatelet therapy: N/A 3. Pain Management/PTSD/bipolar: Oxycodone  as needed, consider adding scheduled pain medication  -hx of fibromyalgia  -11/18 increase oxycodone  PRN frequency to Q4h after discussion with patient  -11/20 will change oxycodone  to 10 mg only and set parameters for blood pressure less than 90 SBP 4. Mood/Behavior/Sleep: Paxil  10 mg daily, melatonin 3 mg nightly, trazodone  75 mg daily, Atarax  25 mg every 6 hours as needed anxiety             -antipsychotic agents: Provide emotional support 5. Neuropsych/cognition: This patient is capable of making decisions on her own behalf. 6. Skin/Wound Care: Routine skin checks 7. Fluids/Electrolytes/Nutrition: Routine in and outs with follow-up chemistries 8.  Hemorrhagic shock.  Received 1 unit packed red blood cells 11/11.  Follow-up CBC 9.  Multiple electrolyte abnormalities/hypocalcemia/hypokalemia/hypomagnesemia/hypophosphatemia.  Supplements ongoing  follow-up chemistries -11/18 sodium and potassium stable, continue calcium  supplementation Recheck tomorrow 10.  Severe hypothyroidism.  TSH 147 on admission.  Noncompliant with levothyroxine .  Continue Synthroid .  Follow-up TSH 2 weeks Has coarse skin, peripheral edema but no severe polyneuropathy  11.  Moderate malnutrition.  Dietary follow-up 12.  Questionable adrenal insufficiency.  Steroids were considered on previous [facility] admission.  She would need follow-up outpatient endocrinology as an outpatient. 13.  Hypoglycemia.  Continue to encourage p.o. intake  - 11/19 appears to be doing little better today 14.  Anemia/thrombocytosis  - 11/18 hemoglobin 7.5 today, platelets 504 continue to monitor trend  - 11/19 recheck tomorrow    Addendum:  called surgery team regarding some drainage around J-tube- start barrier cream for now, surgery to check on it tomorrow   LOS: 2 days A FACE TO FACE EVALUATION WAS PERFORMED  Murray Collier 12/13/2023, 11:52 AM

## 2023-12-13 NOTE — Progress Notes (Signed)
 Hypoglycemic Event  CBG: 69  Treatment: 4 oz juice/soda  Symptoms: None  Follow-up CBG: Time:1822 CBG Result:77  Possible Reasons for Event: Unknown  Comments/MD notified:Per Hypoglycemia event protocol    Damarko Stitely Sara Clee Pandit

## 2023-12-13 NOTE — Progress Notes (Signed)
 Occupational Therapy Session Note  Patient Details  Name: Renee [name] MRN: [id number] Date of Birth: 06/20/1975  Today's Date: 12/13/2023 OT Individual Time: 1107-1200 OT Individual Time Calculation (min): 53 min   Today's Date: 12/13/2023 OT Individual Time: 1420-1530 OT Individual Time Calculation (min): 70 min   Short Term Goals: Week 1:  OT Short Term Goal 1 (Week 1): STGs=LTGs due to ELOS.  Skilled Therapeutic Interventions/Progress Updates:   Session 1:  Pt greeted sitting in WC, reporting a 7/10 pain in abdomin, pre-medicated. Pt requires extended time to process continued frustrations with discharge plan, therapeutic support and assurance provided. As patient organizes her clothing, OT educates on use of reacher to avoid repetitive bending at abdominal wound. Pt receptive. Pt changes UB garments with Min A for bra clasp management. Pt self-propels from room>day room targeting BUE strength/endurance. In day room, pt instructed in ~10 mins of circuit training on Nustep modality for general conditioning and activity tolerance for carryover into ADL participation and functional transfers. Pt tolerates titration up to level 4, x1 rest break required. Stand-pivots from WC<>nustep with CGA. Pt remained sitting in [facility], family present for visit.    Session 2:  Pt greeted sitting in WC, finishing lunch. Reports of 7/10 abdominal pain, rest provided as needed. Time dedicated to retrieving and education patient on rollator for progression towards LRAD. Upon informing that the rollator is not meant to be used as an alternative to a WC, patient declines wanting to attempt use. Pt ambulates from room<>day room with close supervision (OT managing IV pole) + RW. In day room, OT attempts to engage patient with sit<>stands for strengthening. Pt tolerates ~3 repetitions before reporting increased discomfort at jejunostomy tube site. Time dedicated to communicating concerns to treatment team. MD  assesses during session. Pt continues to be verbose and tangential throughout session, therapeutic support provided to increase/improve productivity during session. Pt remained sitting EOB with NT present to provide care.    Therapy Documentation Precautions:  Precautions Precautions: Fall Recall of Precautions/Restrictions: Intact Precaution/Restrictions Comments: J-tube, abdominal incision Restrictions Weight Bearing Restrictions Per Provider Order: No   Therapy/Group: Individual Therapy  [name], OTR/L, MSOT  12/13/2023, 7:52 AM

## 2023-12-13 NOTE — Progress Notes (Signed)
 Physical Therapy Session Note  Patient Details  Name: Renee [name] MRN: [id number] Date of Birth: 10/28/1975  Today's Date: 12/13/2023 PT Individual Time: 1003-1057 PT Individual Time Calculation (min): 54 min   Short Term Goals: Week 1:  PT Short Term Goal 1 (Week 1): STG = LTG due to ELOS  Skilled Therapeutic Interventions/Progress Updates:    Pt presents in room in bed, covers pulled up over head, pt RN present. Pt requires increased time to arouse and max encouragement to participate with therapy secondary to pt reporting pain. Session focused on therapeutic activities to facilitate OOB tolerance, transfer/bed mobility training, and participation with self care tasks. Pt agreeable to therapist providing TED hose for edema and hemodynamic support, therapist dons with total assist for time management, dons non slip socks with total assist. Pt completes bed mobility with increased time, comes to long sitting then short sitting utilizing BUEs to push to upright. Pt takes pain medication while seated EOB, during which therapist brushes hair and puts up with hair tie to improve therapeutic alliance and participation with therapy. Pt completes transfers throughout session with supervision. Pt ambulates with RW to bathroom supervision, requires increased time to complete BM, continent and charted. Pt completes 3/3 toileting tasks with distant supervision. Pt ambulates to sink with RW with supervision to complete hand hygiene, remains standing while talking ~3 minutes. Pt ambulates without device to WC by window with close supervision, comes to sitting in [facility] to participate with choosing clothes for next session with OT. Pt provided with education on recommended DME including WC with pt verbalizing understanding. Pt  remains seated in WC with all needs within reach, cal light in place and chair alarm donned and activated at end of session.    Therapy Documentation Precautions:   Precautions Precautions: Fall Recall of Precautions/Restrictions: Intact Precaution/Restrictions Comments: J-tube, abdominal incision Restrictions Weight Bearing Restrictions Per Provider Order: No     Therapy/Group: Individual Therapy  Reche Ohara PT, DPT 12/13/2023, 4:47 PM

## 2023-12-13 NOTE — Progress Notes (Signed)
 Occupational Therapy Session Note  Patient Details  Name: Acelyn Basham MRN: [id number] Date of Birth: 23-Jul-1975  {CHL IP REHAB OT TIME CALCULATIONS:[id number]}   Short Term Goals: Week 1:  OT Short Term Goal 1 (Week 1): STGs=LTGs due to ELOS.  Skilled Therapeutic Interventions/Progress Updates:      Therapy Documentation Precautions:  Precautions Precautions: Fall Recall of Precautions/Restrictions: Intact Precaution/Restrictions Comments: J-tube, abdominal incision Restrictions Weight Bearing Restrictions Per Provider Order: No   Therapy/Group: Individual Therapy  [name], OTR/L, MSOT  12/13/2023, 4:40 PM

## 2023-12-13 NOTE — Progress Notes (Signed)
 Physical Therapy Session Note  Patient Details  Name: Renee [name] MRN: [id number] Date of Birth: 1975/05/08  Today's Date: 12/13/2023 PT Individual Time: 0840-0908 PT Individual Time Calculation (min): 28 min   Short Term Goals: Week 1:  PT Short Term Goal 1 (Week 1): STG = LTG due to ELOS  Skilled Therapeutic Interventions/Progress Updates: Pt presented in bed c/o significant pain in abdomen. Session focused on education on CIR, progression for rehab, and therapeutic use of self for need for rehab for increased independent on d/c. Pt voiced frustration that received d/c date on day of eval as they didn't really consider my situation. Discussed what pt was able to achieve on day of eval and that through rehab can improve daily. Pt also voiced frustration that he pain was poorly controlled however noted that some pain meds were held due to low BP. Pt was able to voice understanding. Nsg arrived in room to administer pain meds. PTA advised that next session will be at 10a and per nsg pain meds avail at 10:15. Provided edu for advocacy to call before pain meds due and to try and work through therapy session knowing that will receive pain meds during session. Pt voiced understanding. Pt left in bed at end of session with call bell within reach and needs met.      Therapy Documentation Precautions:  Precautions Precautions: Fall Recall of Precautions/Restrictions: Intact Precaution/Restrictions Comments: J-tube, abdominal incision Restrictions Weight Bearing Restrictions Per Provider Order: No General:   Vital Signs: Therapy Vitals Temp: 97.8 F (36.6 C) Temp Source: Oral Pulse Rate: 66 Resp: 17 BP: 97/68 Patient Position (if appropriate): Lying Oxygen Therapy SpO2: 100 % O2 Device: Room Air Pain: Pain Assessment Pain Scale: 0-10 Pain Score: 9  Pain Type: Surgical pain Pain Location: Abdomen Pain Onset: On-going Pain Intervention(s): Medication (See  eMAR)   Therapy/Group: Individual Therapy  Renee [name] 12/13/2023, 9:46 AM
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
## 2023-12-14 DIAGNOSIS — K9419 Other complications of enterostomy: Secondary | ICD-10-CM

## 2023-12-14 LAB — BASIC METABOLIC PANEL WITH GFR
Anion gap: 8 (ref 5–15)
BUN: 10 mg/dL (ref 6–20)
CO2: 20 mmol/L — ABNORMAL LOW (ref 22–32)
Calcium: 7.4 mg/dL — ABNORMAL LOW (ref 8.9–10.3)
Chloride: 108 mmol/L (ref 98–111)
Creatinine, Ser: 0.49 mg/dL (ref 0.44–1.00)
GFR, Estimated: >60 mL/min (ref >60)
Glucose, Bld: 79 mg/dL (ref 70–99)
Potassium: 4.7 mmol/L (ref 3.5–5.1)
Sodium: 136 mmol/L (ref 135–145)

## 2023-12-14 LAB — GLUCOSE, CAPILLARY
Glucose-Capillary: 80 mg/dL (ref 70–99)
Glucose-Capillary: 79 mg/dL (ref 70–99)
Glucose-Capillary: 72 mg/dL (ref 70–99)
Glucose-Capillary: 70 mg/dL (ref 70–99)
Glucose-Capillary: 75 mg/dL (ref 70–99)

## 2023-12-14 LAB — CBC
HCT: 25.4 % — ABNORMAL LOW (ref 36.0–46.0)
Hemoglobin: 7.9 g/dL — ABNORMAL LOW (ref 12.0–15.0)
MCH: 31.1 pg (ref 26.0–34.0)
MCHC: 31.1 g/dL (ref 30.0–36.0)
MCV: 100 fL (ref 80.0–100.0)
Platelets: 541 K/uL — ABNORMAL HIGH (ref 150–400)
RBC: 2.54 MIL/uL — ABNORMAL LOW (ref 3.87–5.11)
RDW: 18.4 % — ABNORMAL HIGH (ref 11.5–15.5)
WBC: 7.5 K/uL (ref 4.0–10.5)
nRBC: 0 % (ref 0.0–0.2)

## 2023-12-14 LAB — MAGNESIUM: Magnesium: 1.6 mg/dL — ABNORMAL LOW (ref 1.7–2.4)

## 2023-12-14 MED ORDER — ACETAMINOPHEN 325 MG PO TABS: 650.0000 mg | ORAL_TABLET | Freq: Four times a day (QID) | ORAL | Status: AC | PRN

## 2023-12-14 MED ORDER — ACETAMINOPHEN 325 MG PO TABS
650.0000 mg | ORAL_TABLET | Freq: Four times a day (QID) | ORAL | Status: DC | PRN
  Administered 2023-12-15 (×2): 650 mg via ORAL
  Filled 2023-12-14 (×2): qty 2

## 2023-12-14 NOTE — Progress Notes (Addendum)
 PROGRESS NOTE    Subjective/Complaints: Patient feels tired today, she thinks it is from increased activity with PT and OT.  Denies fever chills or malaise.  Possible boyfriend may not be able to assist after discharge due to his personal legal issues.  ROS: Patient denies fever, new vision changes, dizziness, nausea, vomiting, diarrhea,  shortness of breath or chest pain, headache, or mood change. + Abdominal pain- a little improved + fatigue   Objective:   No results found. Recent Labs    12/12/23 0509 12/14/23 0453  WBC 6.2 7.5  HGB 7.5* 7.9*  HCT 24.5* 25.4*  PLT 504* 541*   Recent Labs    12/12/23 0509 12/14/23 0453  NA 136 136  K 4.4 4.7  CL 111 108  CO2 21* 20*  GLUCOSE 87 79  BUN 6 10  CREATININE 0.47 0.49  CALCIUM  7.2* 7.4*    Intake/Output Summary (Last 24 hours) at 12/14/2023 1538 Last data filed at 12/14/2023 0800 Gross per 24 hour  Intake 730 ml  Output --  Net 730 ml        Physical Exam: Vital Signs Blood pressure 96/68, pulse 79, temperature 98 F (36.7 C), resp. rate 17, height 5' 7 (1.702 m), weight 67.9 kg, last menstrual period 03/02/2021, SpO2 97%.  General: No apparent distress, lying in bed HEENT: Head is normocephalic, atraumatic, MMM Heart: Reg rate and rhythm. Chest: CTA bilaterally , non-labored Abdomen: Soft,  Abdominal wound dressing CDI, J-tube site with mild tenderness no significant drainage and no significant breakdown around J-tube Extremities: Mild bilateral lower extremity edema Psych: Little anxious again today Skin: Clean and intact without signs of breakdown Neuro: Drowsy but awake, cranial nerves II through XII grossly intact, follows commands, strength 4 out of 5 in bilateral upper and lower extremities, sensation intact in bilateral upper and lower extremities to light touch MSK.  No joint swelling noted   Prior neuro assessment is c/w today's exam  12/14/2023.   Assessment/Plan: 1. Functional deficits which require 3+ hours per day of interdisciplinary therapy in a comprehensive inpatient rehab setting. Physiatrist is providing close team supervision and 24 hour management of active medical problems listed below. Physiatrist and rehab team continue to assess barriers to discharge/monitor patient progress toward functional and medical goals  Care Tool:  Bathing    Body parts bathed by patient: Right arm, Left arm, Chest, Abdomen, Front perineal area, Buttocks, Right upper leg, Left upper leg, Face   Body parts bathed by helper: Right lower leg, Left lower leg     Bathing assist Assist Level: Minimal Assistance - Patient > 75%     Upper Body Dressing/Undressing Upper body dressing   What is the patient wearing?: Pull over shirt    Upper body assist Assist Level: Set up assist    Lower Body Dressing/Undressing Lower body dressing      What is the patient wearing?: Underwear/pull up, Pants     Lower body assist Assist for lower body dressing: Contact Guard/Touching assist     Toileting Toileting    Toileting assist Assist for toileting: Minimal Assistance - Patient > 75%     Transfers Chair/bed transfer  Transfers  assist     Chair/bed transfer assist level: Supervision/Verbal cueing     Locomotion Ambulation   Ambulation assist      Assist level: Supervision/Verbal cueing Assistive device: Walker-rolling Max distance: 125'   Walk 10 feet activity   Assist     Assist level: Supervision/Verbal cueing     Walk 50 feet activity   Assist    Assist level: Supervision/Verbal cueing      Walk 150 feet activity   Assist Walk 150 feet activity did not occur: Safety/medical concerns         Walk 10 feet on uneven surface  activity   Assist Walk 10 feet on uneven surfaces activity did not occur: Safety/medical concerns         Wheelchair     Assist Is the patient using a  wheelchair?: Yes Type of Wheelchair: Manual    Wheelchair assist level: Dependent - Patient 0%      Wheelchair 50 feet with 2 turns activity    Assist        Assist Level: Dependent - Patient 0%   Wheelchair 150 feet activity     Assist      Assist Level: Dependent - Patient 0%   Blood pressure 96/68, pulse 79, temperature 98 F (36.7 C), resp. rate 17, height 5' 7 (1.702 m), weight 67.9 kg, last menstrual period 03/02/2021, SpO2 97%.   Medical Problem List and Plan: 1. 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  Antithrombotics: -DVT/anticoagulation:  Mechanical: Antiembolism stockings, thigh (TED hose) Bilateral lower extremities             -antiplatelet therapy: N/A 3. Pain Management/PTSD/bipolar: Oxycodone  as needed, consider adding scheduled pain medication  -hx of fibromyalgia  -11/18 increase oxycodone  PRN frequency to Q4h after discussion with patient  -11/20 will change oxycodone  to 10 mg only and set parameters for blood pressure less than 90 SBP  - 11/21 pain doing better.  Appears she is using oxycodone  less frequently.  Continue to monitor 4. Mood/Behavior/Sleep: Paxil  10 mg daily, melatonin 3 mg nightly, trazodone  75 mg daily, Atarax  25 mg every 6 hours as needed anxiety             -antipsychotic agents: Provide emotional support 5. Neuropsych/cognition: This patient is capable of making decisions on her own behalf. 6. Skin/Wound Care:  Routine skin checks 7. Fluids/Electrolytes/Nutrition: Routine in and outs with follow-up chemistries 8.  Hemorrhagic shock.  Received 1 unit packed red blood cells 11/11.  Follow-up CBC 9.  Multiple electrolyte abnormalities/hypocalcemia/hypokalemia/hypomagnesemia/hypophosphatemia.  Supplements ongoing follow-up chemistries -11/18 sodium and potassium stable, continue calcium  supplementation -11/20 sodium and potassium stable, calcium  a little bit higher at 7.4 -11/21 magnesium  level 1.6 start supplement with magnesium  oxide 10.  Severe hypothyroidism.  TSH 147 on admission.  Noncompliant with levothyroxine .  Continue Synthroid .  Follow-up TSH 2 weeks Has coarse skin,  peripheral edema but no severe polyneuropathy  11.  Moderate malnutrition.  Dietary follow-up 12.  Questionable adrenal insufficiency.  Steroids were considered on previous [facility] admission.  She would need follow-up outpatient endocrinology as an outpatient. 13.  Hypoglycemia.  Continue to encourage p.o. intake  - 11/21 has been low but stable, continue to monitor 14.  Anemia/thrombocytosis  - 11/18 hemoglobin 7.5 today, platelets 504 continue to monitor trend  - 11/20 hemoglobin low but overall stable at 7.9, platelets a little higher at 541 but likely reactive 15.  Abdominal pain and drainage around J-tube - 11/20 patient was evaluated by surgery today appreciate assistance.  Continue barrier cream and some leakage around J-tube felt to be normal.  Surgery reports they typically allow tubes to be in place for at least 4 weeks before removal.  She has follow-up with Dr. Bennet Floor on 12/4.   -10/21 discussed J-tube plan with patient again today, she will follow-up with outpatient surgery.  Pain appears to be improving  LOS: 3 days A FACE TO FACE EVALUATION WAS PERFORMED  Murray Collier 12/14/2023, 3:38 PM

## 2023-12-14 NOTE — Progress Notes (Signed)
 Physical Therapy Session Note  Patient Details  Name: Renee [name] MRN: [id number] Date of Birth: [birth year]  Today's Date: 12/14/2023 PT Individual Time: 0905-1000 PT Individual Time Calculation (min): 55 min   Short Term Goals: Week 1:  PT Short Term Goal 1 (Week 1): STG = LTG due to ELOS  Skilled Therapeutic Interventions/Progress Updates:    Pt presents in room in recliner, agreeable to PT. Pt reporting having pain, just received pain medications. Session focused on therapeutic activities to facilitate upright tolerance, safety with mobility and family education on pt current functional deficits, DME needs. Pt completes transfers with supervision throughout session, increased time to complete mobility due to pain and low [name] initially. Pt completes gait with RW with supervision from room to day room 125' and comes to sitting on EOM. Pt requesting coffee and therapist provides for improving therapeutic alliance. Pt [name] improves with arrival of fiance and pt with significantly improved performance throughout rest of session. Pt ambulates from day room to main gym with RW with increased gait speed distant supervision ~150', comes to sitting on mat for seated rest break. Pt then completes up/down 8 steps with BHR then cued to use RHR to simulate entrance into home, requires close supervision for task and increased time. Pt takes extended seated rest break then ambulates back to room ~150' and comes to sitting on EOB. Pt requesting information on showering and dressing change, informed pt that her MD recommends not showering at this time due to abdominal incision with pt verbalizing understanding. RN states will come to demonstrate dressing change for pt fiance at end of session. Pt remains seated EOB with fiance seated next to her with all needs within reach at end of session.   Therapy Documentation Precautions:  Precautions Precautions: Fall Recall of Precautions/Restrictions:  Intact Precaution/Restrictions Comments: J-tube, abdominal incision Restrictions Weight Bearing Restrictions Per Provider Order: No    Therapy/Group: Individual Therapy  Reche Ohara PT, DPT 12/14/2023, 10:03 AM

## 2023-12-14 NOTE — Progress Notes (Addendum)
 [facility] Progress Note     Subjective: CC:  Overall doing well. Toleraing PO. Not using J tube. Walked the stairs today. Having BMs with no incontinence of stool.  She tells me she is changing the J tube dressing twice daily due to leakage around tube, which causes burning pain.   Objective: Vital signs in last 24 hours: Temp:  [98 F (36.7 C)-98.4 F (36.9 C)] 98 F (36.7 C) (11/20 0355) Pulse Rate:  [79-93] 79 (11/20 0355) Resp:  [17-18] 17 (11/20 0355) BP: (96-97)/(61-68) 96/68 (11/20 0355) SpO2:  [97 %-100 %] 97 % (11/20 0355) Last BM Date : 12/13/23  Intake/Output from previous day: 11/19 0701 - 11/20 0700 In: 730 [P.O.:120; I.V.:610] Out: -  Intake/Output this shift: Total I/O In: 120 [P.O.:120] Out: -   PE: Gen:  Alert, chronically ill appearing, no acute distress Card:  Regular rate and rhythm Pulm:  Normal effort ORA Abd: Soft, minimally tender, midline dressing c/d/I. J tube with clean dressing in place. Tube is at 6cm above the bumper. There is no active drainage during my exam. There is no erythema, cellulitis, or skin breakdown. Skin: warm and dry, no rashes  Psych: A&Ox3   Lab Results:  Recent Labs    12/12/23 0509 12/14/23 0453  WBC 6.2 7.5  HGB 7.5* 7.9*  HCT 24.5* 25.4*  PLT 504* 541*   BMET Recent Labs    12/12/23 0509 12/14/23 0453  NA 136 136  K 4.4 4.7  CL 111 108  CO2 21* 20*  GLUCOSE 87 79  BUN 6 10  CREATININE 0.47 0.49  CALCIUM 7.2* 7.4*   PT/INR No results for input(s): LABPROT, INR in the last 72 hours. CMP     Component Value Date/Time   NA 136 12/14/2023 0453   NA 137 04/22/2019 1634   K 4.7 12/14/2023 0453   CL 108 12/14/2023 0453   CO2 20 (L) 12/14/2023 0453   GLUCOSE 79 12/14/2023 0453   BUN 10 12/14/2023 0453   BUN 8 04/22/2019 1634   CREATININE 0.49 12/14/2023 0453   CREATININE 0.73 08/02/2016 1237   CALCIUM 7.4 (L) 12/14/2023 0453   CALCIUM INTEDT 11/25/2023 1529   PROT 4.8 (L)  12/12/2023 0509   PROT 5.4 (L) 04/22/2019 1634   ALBUMIN 1.7 (L) 12/12/2023 0509   ALBUMIN 3.0 (L) 04/22/2019 1634   AST 19 12/12/2023 0509   ALT 15 12/12/2023 0509   ALKPHOS 127 (H) 12/12/2023 0509   BILITOT 0.4 12/12/2023 0509   BILITOT 0.3 04/22/2019 1634   GFRNONAA >60 12/14/2023 0453   GFRAA 80 04/22/2019 1634   Lipase     Component Value Date/Time   LIPASE 82 (H) 03/08/2022 1940       Studies/Results: No results found.  Anti-infectives: Anti-infectives (From admission, onward)    None        Assessment/Plan  LAPAROTOMY, EXPLORATORY (N/A) - Exploratory Laparotomy 11/26/23 - Tsuei/ Wilson SMALL INTESTINE WITH ANASTOMOSIS SECONDARY CLOSURE OF WOUND AND J TUBE CREATION with Dr. Stevie 11/4.  POD#16  Patient afebrile. WBC 7.5. Tolerating PO and not using J tube. Unfortunately I do think it is too soon to remove the J tube. I think she needs more time to heal/for her jejunum to scar up to her abdominal wall. We typically leave these tubes in place for at least 4 weeks before removal. Some leakage around the J tube is normal and hers is actually minimal. Agree with using barrier cream as needed to protect  surrounding skin.   Surgery will not follow acutely. Noted plans for discharge Monday. Has follow up 12/4 with Dr. Stevie and can revisit J tube removal at that time. I have also reached to the surgeon to confirm removal prior to CIR discharge is not advised.     LOS: 3 days   I reviewed nursing notes, hospitalist notes, last 24 h vitals and pain scores, last 48 h intake and output, last 24 h labs and trends, and last 24 h imaging results.  This care required straight-forward level of medical decision making.   Almarie Pringle, PA-C [facility] Please see Amion for pager number during day hours 7:00am-4:30pm

## 2023-12-14 NOTE — Plan of Care (Signed)
  Problem: Consults Goal: RH GENERAL PATIENT EDUCATION Description: See Patient Education module for education specifics. Outcome: Progressing   Problem: RH BOWEL ELIMINATION Goal: RH STG MANAGE BOWEL WITH ASSISTANCE Description: STG Manage Bowel with mod I Assistance. Outcome: Progressing   Problem: RH BLADDER ELIMINATION Goal: RH STG MANAGE BLADDER WITH ASSISTANCE Description: STG Manage Bladder With mod I Assistance Outcome: Progressing   Problem: RH SKIN INTEGRITY Goal: RH STG SKIN FREE OF INFECTION/BREAKDOWN Description: Manage skin free of infection with mod I assistance Outcome: Progressing   Problem: RH SAFETY Goal: RH STG ADHERE TO SAFETY PRECAUTIONS W/ASSISTANCE/DEVICE Description: STG Adhere to Safety Precautions With mod I Assistance/Device. Outcome: Progressing   Problem: RH PAIN MANAGEMENT Goal: RH STG PAIN MANAGED AT OR BELOW PT'S PAIN GOAL Description: <4 w/ prns Outcome: Progressing   Problem: RH KNOWLEDGE DEFICIT GENERAL Goal: RH STG INCREASE KNOWLEDGE OF SELF CARE AFTER HOSPITALIZATION Description: Manage increase  knowledge of self care after hospitalization from partner using educational materials provided Outcome: Progressing

## 2023-12-14 NOTE — Progress Notes (Signed)
 Physical Therapy Session Note  Patient Details  Name: Renee [name] MRN: [id number] Date of Birth: November 23, 1975  Today's Date: 12/14/2023 PT Individual Time: 1420-1500 PT Individual Time Calculation (min): 40 min   Short Term Goals: Week 1:  PT Short Term Goal 1 (Week 1): STG = LTG due to ELOS  Skilled Therapeutic Interventions/Progress Updates:    Pt presents in room in bathroom, handoff from nursing staff. Pt agreeable to PT. Pt does not report pain during session. Session focused on therapeutic activities to promote activity tolerance, participation and safety with self care tasks and transfers, and upright tolerance. Pt completes 3/3 toileting tasks with distant supervision in standing without device. Pt ambulates to sink to complete hand hygiene with supervision. Pt sits on EOB, requires cueing for attention to task to don shoes. Pt then ambulates with RW to day room, completes transfer to nustep. Pt completes continuous activity on nustep BLEs only 6:30 min at L3 resistancce, completed to promote improved activity tolerance. Pt then completes gait 360' with RW with supervision to promote improved upright tolerance. Pt returns to room and remains seated in recliner with all needs within reach, cal light in place and chair alarm donned and activated at end of session, RN present to administer medications.   Therapy Documentation Precautions:  Precautions Precautions: Fall Recall of Precautions/Restrictions: Intact Precaution/Restrictions Comments: J-tube, abdominal incision Restrictions Weight Bearing Restrictions Per Provider Order: No   Therapy/Group: Individual Therapy  Reche Ohara PT, DPT 12/14/2023, 4:12 PM

## 2023-12-14 NOTE — IPOC Note (Signed)
 Overall Plan of Care [facility]) Patient Details Name: Renee [name] MRN: [id number] DOB: [birth year]  Admitting Diagnosis: Debility  Hospital Problems: Principal Problem:   Debility     Functional Problem List: Nursing Bladder, Bowel, Edema, Endurance, Medication Management, Nutrition, Pain, Safety, Skin Integrity  PT Balance, Behavior, Edema, Endurance, Motor, Pain, Perception, Safety, Sensory  OT Balance, Endurance, Pain, Safety, Skin Integrity  SLP    TR         Basic ADL's: OT Bathing, Dressing, Toileting     Advanced  ADL's: OT       Transfers: PT Bed Mobility, Bed to Chair, Car, Occupational Psychologist, Research Scientist (life Sciences): PT Stairs, Ambulation     Additional Impairments: OT    SLP        TR      Anticipated Outcomes Item Anticipated Outcome  Self Feeding    Swallowing      Basic self-care  Mod I  Toileting  Mod I   Bathroom Transfers Mod I  Bowel/Bladder  patient will be able to manage own gastrostomy/enterostomy/jejunostomy/ Manage bladder with toileting assistance/ timed toileting  Transfers  modI  Locomotion  modI ambulatory, minA stair negotiation  Communication     Cognition     Pain  <4 w/ prns  Safety/Judgment  manage safety with mod I assistance   Therapy Plan: PT Intensity: Minimum of 1-2 x/day ,45 to 90 minutes PT Frequency: 5 out of 7 days PT Duration Estimated Length of Stay: 7-10 days OT Intensity: Minimum of 1-2 x/day, 45 to 90 minutes OT Frequency: 5 out of 7 days OT Duration/Estimated Length of Stay: 7-10 days     Team Interventions: Nursing Interventions Patient/Family Education, Skin Care/Wound Management, Bladder Management, Bowel Management, Disease Management/Prevention, Pain Management, Medication Management, Discharge Planning  PT interventions Ambulation/gait training, Community reintegration, DME/adaptive equipment instruction, Neuromuscular re-education, Psychosocial support, Stair training, UE/LE Strength  taining/ROM, Warden/ranger, Discharge planning, Pain management, Therapeutic Activities, UE/LE Coordination activities, Cognitive remediation/compensation, Disease management/prevention, Functional mobility training, Patient/family education, Therapeutic Exercise  OT Interventions Warden/ranger, Community reintegration, Discharge planning, Disease mangement/prevention, DME/adaptive equipment instruction, Functional mobility training, Pain management, Patient/family education, Psychosocial support, Self Care/advanced ADL retraining, Therapeutic Activities, Therapeutic Exercise, UE/LE Strength taining/ROM  SLP Interventions    TR Interventions    SW/CM Interventions Discharge Planning, Psychosocial Support, Patient/Family Education   Barriers to Discharge MD  Medical stability and Wound care  Nursing Decreased caregiver support, Home environment access/layout, Incontinence, Wound Care Discharge: House  Discharge Home Layout: One level  Discharge Home Access: Stairs to enter  Entrance Stairs-Rails: None  Entrance Stairs-Number of Steps: 3  PT Inaccessible home environment, Decreased caregiver support 3 STE no handrails, decreased assistance at DC  OT Decreased caregiver support, Wound Care    SLP      SW Decreased caregiver support, Lack of/limited family support, Community Education Officer for SNF coverage     Team Discharge Planning: Destination: PT-Home ,OT- Home , SLP-  Projected Follow-up: PT-Home health PT, OT-  None, SLP-  Projected Equipment Needs: PT-To be determined, OT- To be determined, SLP-  Equipment Details: PT-need to confirm if pt owns RW or WC, OT-  Patient/family involved in discharge planning: PT- Patient,  OT-Patient, SLP-   MD ELOS: 7-10 days Medical Rehab Prognosis:  Excellent Assessment: The patient has been admitted for CIR therapies with the diagnosis of 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 The team will be addressing functional mobility,  strength, stamina, balance, safety, adaptive techniques  and equipment, self-care, bowel and bladder mgt, patient and caregiver education, community reintegration, pain control. Goals have been set at mod I. Anticipated discharge destination is home.        See Team Conference Notes for weekly updates to the plan of care

## 2023-12-15 DIAGNOSIS — F3131 Bipolar disorder, current episode depressed, mild: Secondary | ICD-10-CM

## 2023-12-15 LAB — GLUCOSE, CAPILLARY
Glucose-Capillary: 75 mg/dL (ref 70–99)
Glucose-Capillary: 80 mg/dL (ref 70–99)
Glucose-Capillary: 79 mg/dL (ref 70–99)
Glucose-Capillary: 77 mg/dL (ref 70–99)

## 2023-12-15 MED ORDER — MAGNESIUM OXIDE -MG SUPPLEMENT 400 (240 MG) MG PO TABS
400.0000 mg | ORAL_TABLET | Freq: Two times a day (BID) | ORAL | Status: DC
  Administered 2023-12-15 – 2023-12-18 (×6): 400 mg via ORAL
  Filled 2023-12-15 (×6): qty 1

## 2023-12-15 NOTE — Progress Notes (Signed)
 Occupational Therapy Session Note  Patient Details  Name: Renee [name] MRN: [id number] Date of Birth: January 10, 1976  Today's Date: 12/15/2023 OT Individual Time: 9294-9284 OT Individual Time Calculation (min): 10 min  and Today's Date: 12/15/2023 OT Missed Time: 65 Minutes Missed Time Reason: Other (comment) (Eating)  Today's Date: 12/15/2023 OT Individual Time: 8864-8854 OT Individual Time Calculation (min): 10 min    Today's Date: 12/15/2023 OT Individual Time: 8679-8657 OT Individual Time Calculation (min): 22 min   Short Term Goals: Week 1:  OT Short Term Goal 1 (Week 1): STGs=LTGs due to ELOS.  Skilled Therapeutic Interventions/Progress Updates:   Session 1: Pt greeted resting in bed, time required to eat breakfast, pt receptive to thorough sponge-bathing. As patient eats meal, OT gathers needed supplies for bathing. Upon OT return patient, still needing to finish over half of meal. Session terminated and OT to attempted later in the AM. Pt remained resting in bed with all immediate needs met.   Session 2: (OT RETURNS IN ATTEMPT TO MAKE UP MISSED MINUTES) Pt greeted resting in bed, increased lethargy noted. Pt shares . . . I have realized I'm in my low of bipolar. . .. Pt continues to be concerned about . . .not taking bipolar medication. . .. Time dedicated to discussing concern with covering PA whom assures patient will now be on appropriate medications post visit with Neuropsychology. Information communicated to patient. Attempted to encourage patient to progress with OOB activity, but patient declines. Provided options for bed-level participation with no success. Pt remained resting in bed with all immediate needs met.   Session 3:  OT arrives for PM therapy session to find patient surrounded by extended family and child psychotherapist. Time provided to accommodate conversation with SW. Upon OT return, session focused on caregiver education with patient's mother present.  Education provided on current level of functioning and DME recommendations. Handout provided for RW bag to assist with transportation of items. Pt remained sitting EOB with mother present, all immediate needs met.   Therapy Documentation Precautions:  Precautions Precautions: Fall Recall of Precautions/Restrictions: Intact Precaution/Restrictions Comments: J-tube, abdominal incision Restrictions Weight Bearing Restrictions Per Provider Order: No   Therapy/Group: Individual Therapy  [name], OTR/L, MSOT  12/15/2023, 6:49 AM

## 2023-12-15 NOTE — Plan of Care (Signed)
  Problem: Consults Goal: RH GENERAL PATIENT EDUCATION Description: See Patient Education module for education specifics. Outcome: Progressing   Problem: RH BOWEL ELIMINATION Goal: RH STG MANAGE BOWEL WITH ASSISTANCE Description: STG Manage Bowel with mod I Assistance. Outcome: Progressing   Problem: RH BLADDER ELIMINATION Goal: RH STG MANAGE BLADDER WITH ASSISTANCE Description: STG Manage Bladder With mod I Assistance Outcome: Progressing   Problem: RH SKIN INTEGRITY Goal: RH STG SKIN FREE OF INFECTION/BREAKDOWN Description: Manage skin free of infection with mod I assistance Outcome: Progressing   Problem: RH SAFETY Goal: RH STG ADHERE TO SAFETY PRECAUTIONS W/ASSISTANCE/DEVICE Description: STG Adhere to Safety Precautions With mod I Assistance/Device. Outcome: Progressing   Problem: RH PAIN MANAGEMENT Goal: RH STG PAIN MANAGED AT OR BELOW PT'S PAIN GOAL Description: <4 w/ prns Outcome: Progressing   Problem: RH KNOWLEDGE DEFICIT GENERAL Goal: RH STG INCREASE KNOWLEDGE OF SELF CARE AFTER HOSPITALIZATION Description: Manage increase  knowledge of self care after hospitalization from partner using educational materials provided Outcome: Progressing

## 2023-12-15 NOTE — Progress Notes (Signed)
 Physical Therapy Session Note  Patient Details  Name: Renee [name] MRN: [id number] Date of Birth: [birth year]  Today's Date: 12/15/2023 PT Individual Time: 8594-8564 PT Individual Time Calculation (min): 30 min   Short Term Goals: Week 1:  PT Short Term Goal 1 (Week 1): STG = LTG due to ELOS  Skilled Therapeutic Interventions/Progress Updates: Pt presented sitting EOB reaching for coloring book. Pt c/o pain, premedicated. Session focused on therapeutic use of self to address any concerns in preparation for d/c. Pt concerned will not be able to be as self sufficient upon d/c. Discussed CLOF (distant supervision to mod I) and that current endurance levels have improved. Pt also educated on continuing activities once d/c (ambulation, transfers, etc) with pt verbalizing understanding. Pt left seated EOB at end of session with call bell within reach and needs met.      Therapy Documentation Precautions:  Precautions Precautions: Fall Recall of Precautions/Restrictions: Intact Precaution/Restrictions Comments: J-tube, abdominal incision Restrictions Weight Bearing Restrictions Per Provider Order: No General:   Vital Signs: Therapy Vitals Temp: 98.5 F (36.9 C) Temp Source: Oral Pulse Rate: 85 Resp: 18 BP: 100/82 Patient Position (if appropriate): Lying Oxygen Therapy SpO2: 99 % O2 Device: Room Air Pain: Pain Assessment Pain Scale: 0-10 Pain Score: 7  Pain Location: Abdomen Pain Intervention(s): Medication (See eMAR) Mobility: Transfers Sit to Stand: Independent with assistive device Stand to Sit: Independent with assistive device Stand Pivot Transfers: Independent with assistive device Transfer (Assistive device): Rolling walker Locomotion :    Trunk/Postural Assessment : Cervical Assessment Cervical Assessment: Exceptions to [facility] (Forward head) Thoracic Assessment Thoracic Assessment: Exceptions to [facility] (Rounded shoulders) Lumbar Assessment Lumbar Assessment:  Exceptions to [facility] (Posterior pelvic tilt) Postural Control Postural Control: Within Functional Limits  Balance: Balance Balance Assessed: Yes Static Sitting Balance Static Sitting - Balance Support: Feet supported Static Sitting - Level of Assistance: 7: Independent Dynamic Sitting Balance Dynamic Sitting - Balance Support: During functional activity Dynamic Sitting - Level of Assistance: 6: Modified independent (Device/Increase time) Static Standing Balance Static Standing - Balance Support: During functional activity Static Standing - Level of Assistance: 6: Modified independent (Device/Increase time) Dynamic Standing Balance Dynamic Standing - Balance Support: During functional activity Dynamic Standing - Level of Assistance: 6: Modified independent (Device/Increase time)   Therapy/Group: Individual Therapy  Lety Cullens 12/15/2023, 4:19 PM

## 2023-12-15 NOTE — Consult Note (Signed)
 Neuropsychological Consultation Comprehensive Inpatient Rehab   Patient:   Renee [name]   DOB:   11-30-1975  MR Number:  [id number]  Location:  [facility] [address] CENTER [address] [place] [address] Dept: [phone] Loc: [phone]           Date of Service:   12/15/2023  Start Time:   10 AM End Time:   11 AM  Provider/Observer:  Norleen Asa, Psy.D.       Clinical Neuropsychologist       Billing Code/Service: [phone]  Reason for Service:    Renee [name] is a 48 year old right-handed female referred for neuropsychological consultation due to coping and adjustment issues with long psychiatric history.  Patient was admitted 11/25/2023 for persistent hypokalemia and abdominal pain. Admission workup revealed pneumatosis and portal venous gas, leading to exploratory laparotomy and small bowel resection with subsequent complications of hemorrhagic shock and multiple electrolyte abnormalities. Hospital course has been notable for severe hypothyroidism and debility, leading to admission for comprehensive rehabilitation.  Current status: Reports today is a down day consistent with her bipolar disorder, characterized by fatigue, desire to sleep, and reduced motivation to engage. Expresses significant anxiety about returning home and resuming responsibilities. Major psychosocial stressor is her partner, Arley, who has a court date today for absconding and violating probation. The outcome is unknown and will determine his availability to provide support upon her discharge. She reports he has been unreliable and self-absorbed during her current medical crisis, in contrast to past support, although that has always been inconsistent with long off and on relationship. She feels betrayed by him for misusing money her father loaned him and for his recent actions. She describes a complex, long-standing relationship with him and his  mother, who she reports has been manipulative. If her partner is incarcerated (he has court appearance today for probations violation of leaving state), her unrealistic plan is to apply for a Medicaid home health aide. These details of boyfriends tenuous status are ability to help at discharge had not been fully explained to care team.  She is aware she needs temporary support at discharge and the stress around what is happening is likely playing a role in her depressive mood state this morning. Reports adhering to therapy schedule despite low motivation and lethargy.  BEHAVIORAL OBSERVATIONS/MENTAL STATUS EXAM: Lethargic/somnolent but oriented. Affect is congruent with reported mood, appearing tired and somewhat withdrawn. Speech is somewhat slurred but coherent. Thought process is generally concrete but linear. Insight into her mood disorder and the need for behavioral activation is good. Demonstrates perseverance in her commitment to therapy despite feeling lethargic.  ASSESSMENT: Renee [name] reports that she is experiencing an acute depressive swing, likely related to her bipolar disorder, exacerbated by significant situational stressors including her imminent discharge and uncertainty about her partner's legal status and availability for support. She understands the rationale for behavioral activation to counteract the depressive trajectory. Her current psychotropic regimen (Paxil ) as managed by her outpatient psychiatrist, Dr. Vincente, is being maintained during her current admission. The focus of this session was supportive psychotherapy and psychoeducation.  PLAN: 1. Provided supportive counseling focused on managing current mood state and stressors. 2. Psychoeducation provided on the importance of behavioral activation in managing bipolar depression. Emphasized engaging in therapies and activity regardless of mood or motivation, framing the current functional limitations with right now. 3.  Encouraged her to focus on immediate, controllable tasks, specifically her rehabilitation therapies for today. 4.  Will coordinate with social worker, Graeme, to ensure she is aware of the potential lack of partner support for discharge planning, so a contingency plan can be established if needed. 5. Recommended she follow up with her psychiatrist, Dr. Vincente, and her endocrinologist post-discharge.          Electronically Signed   _______________________ Norleen Asa, Psy.D. Clinical Neuropsychologist

## 2023-12-15 NOTE — Progress Notes (Incomplete)
 Patient ID: Renee [name], female   DOB: 11/04/1975, 48 y.o.   MRN: [id number]    Graeme [name], MSW, LCSW Office: [phone] Cell: [phone] Fax: [phone]

## 2023-12-15 NOTE — Progress Notes (Signed)
 PROGRESS NOTE    Subjective/Complaints: Patient feels tired today, she thinks it is from increased activity with PT and OT.  Denies fever chills or malaise.  Possible boyfriend may not be able to assist after discharge due to his personal legal issues.  ROS: Patient denies fever, new vision changes, dizziness, nausea, vomiting, diarrhea,  shortness of breath or chest pain, headache, or mood change. + Abdominal pain- a little improved + fatigue   Objective:   No results found. Recent Labs    12/14/23 0453  WBC 7.5  HGB 7.9*  HCT 25.4*  PLT 541*   Recent Labs    12/14/23 0453  NA 136  K 4.7  CL 108  CO2 20*  GLUCOSE 79  BUN 10  CREATININE 0.49  CALCIUM  7.4*    Intake/Output Summary (Last 24 hours) at 12/15/2023 1308 Last data filed at 12/14/2023 2200 Gross per 24 hour  Intake 834.07 ml  Output --  Net 834.07 ml        Physical Exam: Vital Signs Blood pressure 100/69, pulse 80, temperature 98 F (36.7 C), temperature source Oral, resp. rate 18, height 5' 7 (1.702 m), weight 67.9 kg, last menstrual period 03/02/2021, SpO2 100%.  General: No apparent distress, lying in bed HEENT: Head is normocephalic, atraumatic, MMM Heart: Reg rate and rhythm. Chest: CTA bilaterally , non-labored Abdomen: Soft,  Abdominal wound dressing CDI, J-tube site with mild tenderness no significant drainage and no significant breakdown around J-tube Extremities: Mild bilateral lower extremity edema Psych: Little anxious again today Skin: Clean and intact without signs of breakdown Neuro: Drowsy but awake, cranial nerves II through XII grossly intact, follows commands, strength 4 out of 5 in bilateral upper and lower extremities, sensation intact in bilateral upper and lower extremities to light touch MSK.  No joint swelling noted   Prior neuro assessment is c/w today's exam 12/15/2023.   Assessment/Plan: 1. Functional  deficits which require 3+ hours per day of interdisciplinary therapy in a comprehensive inpatient rehab setting. Physiatrist is providing close team supervision and 24 hour management of active medical problems listed below. Physiatrist and rehab team continue to assess barriers to discharge/monitor patient progress toward functional and medical goals  Care Tool:  Bathing    Body parts bathed by patient: Right arm, Left arm, Chest, Abdomen, Front perineal area, Buttocks, Right upper leg, Left upper leg, Face   Body parts bathed by helper: Right lower leg, Left lower leg     Bathing assist Assist Level: Minimal Assistance - Patient > 75%     Upper Body Dressing/Undressing Upper body dressing   What is the patient wearing?: Pull over shirt    Upper body assist Assist Level: Set up assist    Lower Body Dressing/Undressing Lower body dressing      What is the patient wearing?: Underwear/pull up, Pants     Lower body assist Assist for lower body dressing: Contact Guard/Touching assist     Toileting Toileting    Toileting assist Assist for toileting: Minimal Assistance - Patient > 75%     Transfers Chair/bed transfer  Transfers assist     Chair/bed transfer assist level: Supervision/Verbal cueing  Locomotion Ambulation   Ambulation assist      Assist level: Supervision/Verbal cueing Assistive device: Walker-rolling Max distance: 125'   Walk 10 feet activity   Assist     Assist level: Supervision/Verbal cueing     Walk 50 feet activity   Assist    Assist level: Supervision/Verbal cueing      Walk 150 feet activity   Assist Walk 150 feet activity did not occur: Safety/medical concerns         Walk 10 feet on uneven surface  activity   Assist Walk 10 feet on uneven surfaces activity did not occur: Safety/medical concerns         Wheelchair     Assist Is the patient using a wheelchair?: Yes Type of Wheelchair: Manual     Wheelchair assist level: Dependent - Patient 0%      Wheelchair 50 feet with 2 turns activity    Assist        Assist Level: Dependent - Patient 0%   Wheelchair 150 feet activity     Assist      Assist Level: Dependent - Patient 0%   Blood pressure 100/69, pulse 80, temperature 98 F (36.7 C), temperature source Oral, resp. rate 18, height 5' 7 (1.702 m), weight 67.9 kg, last menstrual period 03/02/2021, SpO2 100%.   Medical Problem List and Plan: 1. 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  Antithrombotics: -DVT/anticoagulation:  Mechanical: Antiembolism stockings, thigh (TED hose) Bilateral lower extremities             -antiplatelet therapy: N/A 3. Pain Management/PTSD/bipolar: Oxycodone  as needed, consider adding scheduled pain medication  -hx of fibromyalgia  -11/18 increase oxycodone  PRN frequency to Q4h after discussion with patient  -11/20 will change oxycodone  to 10 mg only and set parameters for blood pressure less than 90 SBP  - 11/21 pain doing better.  Appears she is using oxycodone  less frequently.  Continue to monitor 4. Mood/Behavior/Sleep: Paxil  10 mg daily, melatonin 3 mg nightly, trazodone  75 mg daily, Atarax  25 mg every 6 hours as needed anxiety             -antipsychotic agents: Provide emotional support 5. Neuropsych/cognition: This patient is capable of making decisions on her own behalf. 6. Skin/Wound Care: Routine skin  checks 7. Fluids/Electrolytes/Nutrition: Routine in and outs with follow-up chemistries 8.  Hemorrhagic shock.  Received 1 unit packed red blood cells 11/11.  Follow-up CBC 9.  Multiple electrolyte abnormalities/hypocalcemia/hypokalemia/hypomagnesemia/hypophosphatemia.  Supplements ongoing follow-up chemistries -11/18 sodium and potassium stable, continue calcium  supplementation -11/20 sodium and potassium stable, calcium  a little bit higher at 7.4 -11/21 magnesium  level 1.6 start supplement with magnesium  oxide 10.  Severe hypothyroidism.  TSH 147 on admission.  Noncompliant with levothyroxine .  Continue Synthroid .  Follow-up TSH 2 weeks Has coarse skin, peripheral edema but no severe polyneuropathy  11.  Moderate malnutrition.  Dietary follow-up 12.  Questionable adrenal insufficiency.  Steroids were considered on previous [facility] admission.  She would need follow-up outpatient endocrinology as an outpatient. 13.  Hypoglycemia.  Continue to encourage p.o. intake  - 11/21 has been low but stable, continue to monitor 14.  Anemia/thrombocytosis  - 11/18 hemoglobin 7.5 today, platelets 504 continue to monitor trend  - 11/20 hemoglobin low but overall stable at 7.9, platelets a little higher at 541 but likely reactive 15.  Abdominal pain and drainage around J-tube - 11/20 patient was evaluated by surgery today appreciate assistance.  Continue barrier cream and some leakage around J-tube felt to be normal.  Surgery reports they typically allow tubes to be in place for at least 4 weeks before removal.  She has follow-up with Dr. Bennet Floor on 12/4.   -10/21 discussed J-tube plan with patient again today, she will follow-up with outpatient surgery.  Pain appears to be improving  LOS: 4 days A FACE TO FACE EVALUATION WAS PERFORMED  Murray Collier 12/15/2023, 1:08 PM

## 2023-12-15 NOTE — Progress Notes (Shared)
 Occupational Therapy Discharge Summary  Patient Details  Name: Renee [name] MRN: [id number] Date of Birth: 1975-09-06  Date of Discharge from OT service:December 17, 2023   Patient has met {NUMBERS 0-12:18577} of {NUMBERS 0-12:18577} long term goals due to {due to:3041651}.  Patient to discharge at overall {LOA:3049010} level.  Patient's care partner {care partner:[id number]} to provide the necessary {assistance:3041652} assistance at discharge.    Reasons goals not met: ***  Recommendation:  Patient will benefit from ongoing skilled OT services in home health setting to continue to advance functional skills in the area of BADL and iADL.  Equipment: BSC, TTB, & WC  Reasons for discharge: treatment goals met and discharge from hospital  Patient/family agrees with progress made and goals achieved: Yes  OT Discharge Precautions/Restrictions  Precautions Precautions: Fall Recall of Precautions/Restrictions: Intact Precaution/Restrictions Comments: J-tube, abdominal incision Restrictions Weight Bearing Restrictions Per Provider Order: No Pain Pain Assessment Pain Scale: 0-10 Pain Score: 9  Pain Location: Abdomen Pain Intervention(s): Medication (See eMAR) ADL ADL Eating: Independent Where Assessed-Eating: Bed level, Wheelchair, Edge of bed Grooming: Modified independent Where Assessed-Grooming: Sitting at sink Upper Body Bathing: Modified independent Where Assessed-Upper Body Bathing: Edge of bed, Wheelchair, Chair Lower Body Bathing: Modified independent Where Assessed-Lower Body Bathing: Edge of bed, Chair, Wheelchair Upper Body Dressing: Modified independent (Device) Where Assessed-Upper Body Dressing: Edge of bed, Chair, Wheelchair Lower Body Dressing: Modified independent Where Assessed-Lower Body Dressing: Edge of bed, Chair, Wheelchair Toileting: Modified independent Where Assessed-Toileting: Toilet, Bedside Commode Toilet Transfer: Modified independent Doctor, General Practice Method: Proofreader: Gaffer: Not assessed Film/video Editor: Not assessed Vision Baseline Vision/History:  (L-eye removed.) Patient Visual Report: No change from baseline Vision Assessment?: No apparent visual deficits Additional Comments: L eye removed (baseline). Perception  Perception: Within Functional Limits Praxis Praxis: WFL Cognition Cognition Overall Cognitive Status: Within Functional Limits for tasks assessed Arousal/Alertness: Awake/alert Memory: Appears intact Awareness: Appears intact Safety/Judgment: Appears intact Sensation Sensation Light Touch: Impaired Detail Peripheral sensation comments: Reports cervical sternosis and carpal tunnel. Coordination Gross Motor Movements are Fluid and Coordinated: No Fine Motor Movements are Fluid and Coordinated: Yes Coordination and Movement Description: Deficits due to generalized weakness/debility (improved since evaluation). Motor  Motor Motor: Other (comment) Motor - Skilled Clinical Observations: Deficits due to generalized weakness/debility. Mobility  Transfers Sit to Stand: Independent with assistive device Stand to Sit: Independent with assistive device  Trunk/Postural Assessment  Cervical Assessment Cervical Assessment: Exceptions to [facility] (Forward head) Thoracic Assessment Thoracic Assessment: Exceptions to [facility] (Rounded shoulders) Lumbar Assessment Lumbar Assessment: Exceptions to [facility] (Posterior pelvic tilt) Postural Control Postural Control: Within Functional Limits  Balance Balance Balance Assessed: Yes Static Sitting Balance Static Sitting - Balance Support: Feet supported Static Sitting - Level of Assistance: 7: Independent Dynamic Sitting Balance Dynamic Sitting - Balance Support: During functional activity Dynamic Sitting - Level of Assistance: 6: Modified independent (Device/Increase time) Static Standing Balance Static  Standing - Balance Support: During functional activity Static Standing - Level of Assistance: 6: Modified independent (Device/Increase time) Dynamic Standing Balance Dynamic Standing - Balance Support: During functional activity Dynamic Standing - Level of Assistance: 6: Modified independent (Device/Increase time) Extremity/Trunk Assessment RUE Assessment RUE Assessment: Within Functional Limits LUE Assessment Active Range of Motion (AROM) Comments: Pt reports RTC injury PTA. General Strength Comments: 2-/5   [name], OTR/L, MSOT  12/15/2023, 3:01 PM

## 2023-12-15 NOTE — Plan of Care (Signed)
  Problem: RH Tub/Shower Transfers Goal: LTG Patient will perform tub/shower transfers w/assist (OT) Description: LTG: Patient will perform tub/shower transfers with assist, with/without cues using equipment (OT) Outcome: Not Applicable Note: Goal discharged as patient will sponge-bathe upon discharge.

## 2023-12-15 NOTE — Progress Notes (Signed)
 Physical Therapy Session Note  Patient Details  Name: Renee [name] MRN: [id number] Date of Birth: 18-Aug-1975  Today's Date: 12/15/2023 PT Individual Time: 0905-0950 PT Individual Time Calculation (min): 45 min   Short Term Goals: Week 1:  PT Short Term Goal 1 (Week 1): STG = LTG due to ELOS  Skilled Therapeutic Interventions/Progress Updates:    Pt presents in room in bed, slow to awaken, respond, and initiate mobility but agreeable to PT with encouragement. Pt reports she is exhausted, pain is okay at start of session, NPS 6/10, premedicated. Session focused on therapeutic activities to facilitate OOB tolerance and participation with self care tasks as well as tolerance to upright. Pt completes bed mobility very slowly with supervision, requires increased time sitting EOB to initiate donning shoes. Therapist dons TED hose and nonslip socks total assist for time management. Once seated EOB pt requires significant time and encouragement to transfer to [facility] for dependent transport to day room. Therapist using therapeutic use of self to get pt to engage with therapy, offering coffee and conversation however pt with low mood and requesting to return to room. Pt missing 15 min of 60 min session due to fatigue and decreased participation with therapies. Pt returns to supine in bed with supervision where she remains semi reclined with all needs within reach, call light in place, bed alarm activated at end of session.   Therapy Documentation Precautions:  Precautions Precautions: Fall Recall of Precautions/Restrictions: Intact Precaution/Restrictions Comments: J-tube, abdominal incision Restrictions Weight Bearing Restrictions Per Provider Order: No   Therapy/Group: Individual Therapy  Reche Ohara PT, DPT 12/15/2023, 10:02 AM

## 2023-12-16 DIAGNOSIS — E43 Unspecified severe protein-calorie malnutrition: Secondary | ICD-10-CM | POA: Insufficient documentation

## 2023-12-16 LAB — GLUCOSE, CAPILLARY
Glucose-Capillary: 205 mg/dL — ABNORMAL HIGH (ref 70–99)
Glucose-Capillary: 63 mg/dL — ABNORMAL LOW (ref 70–99)
Glucose-Capillary: 79 mg/dL (ref 70–99)
Glucose-Capillary: 79 mg/dL (ref 70–99)
Glucose-Capillary: 84 mg/dL (ref 70–99)
Glucose-Capillary: 84 mg/dL (ref 70–99)

## 2023-12-16 MED ORDER — ENSURE PLUS HIGH PROTEIN PO LIQD
237.0000 mL | Freq: Three times a day (TID) | ORAL | Status: DC
  Administered 2023-12-16 – 2023-12-18 (×5): 237 mL via ORAL

## 2023-12-16 NOTE — Progress Notes (Signed)
 Brief Nutrition Note  RD received consult due to pt continuing to require dextrose  infusions for hypoglycemia. MD requested assessment of pt's intake and supplements to see if any changes/recommendations could be made to help pt get off dextrose . RD working remotely during assessment. Pt set to be discharged Monday 11/24  Average Meal Completion: 11/17-11/22: 56% average intake x 5 recorded meals  Adjusted Supplements: Switched ensure max to ensure plus as it contains more carbohydrates per serving Increased dosage to 3x daily Added Magic cup TID with meals, each supplement provides 290 kcal and 9 grams of protein  Labs reviewed: CBG x 24 h: 63-205 mg/dL     Josette Glance, MS, RDN, LDN Clinical Dietitian I Please reach out via secure chat

## 2023-12-16 NOTE — Plan of Care (Signed)
  Problem: Consults Goal: RH GENERAL PATIENT EDUCATION Description: See Patient Education module for education specifics. Outcome: Progressing   Problem: RH BOWEL ELIMINATION Goal: RH STG MANAGE BOWEL WITH ASSISTANCE Description: STG Manage Bowel with mod I Assistance. Outcome: Progressing   Problem: RH BLADDER ELIMINATION Goal: RH STG MANAGE BLADDER WITH ASSISTANCE Description: STG Manage Bladder With mod I Assistance Outcome: Progressing   Problem: RH SKIN INTEGRITY Goal: RH STG SKIN FREE OF INFECTION/BREAKDOWN Description: Manage skin free of infection with mod I assistance Outcome: Progressing   Problem: RH SAFETY Goal: RH STG ADHERE TO SAFETY PRECAUTIONS W/ASSISTANCE/DEVICE Description: STG Adhere to Safety Precautions With mod I Assistance/Device. Outcome: Progressing   Problem: RH PAIN MANAGEMENT Goal: RH STG PAIN MANAGED AT OR BELOW PT'S PAIN GOAL Description: <4 w/ prns Outcome: Progressing   Problem: RH KNOWLEDGE DEFICIT GENERAL Goal: RH STG INCREASE KNOWLEDGE OF SELF CARE AFTER HOSPITALIZATION Description: Manage increase  knowledge of self care after hospitalization from partner using educational materials provided Outcome: Progressing

## 2023-12-16 NOTE — Progress Notes (Signed)
 PROGRESS NOTE    Subjective/Complaints:  No issues overnight  ROS: Patient denies fever, new vision changes, dizziness, nausea, vomiting, diarrhea,  shortness of breath or chest pain, headache, or mood change. + Abdominal pain- a little improved + fatigue   Objective:   No results found. Recent Labs    12/14/23 0453  WBC 7.5  HGB 7.9*  HCT 25.4*  PLT 541*   Recent Labs    12/14/23 0453  NA 136  K 4.7  CL 108  CO2 20*  GLUCOSE 79  BUN 10  CREATININE 0.49  CALCIUM  7.4*    Intake/Output Summary (Last 24 hours) at 12/16/2023 1409 Last data filed at 12/16/2023 1001 Gross per 24 hour  Intake 600 ml  Output --  Net 600 ml        Physical Exam: Vital Signs Blood pressure 105/76, pulse 75, temperature 98.4 F (36.9 C), temperature source Oral, resp. rate 18, height 5' 7 (1.702 m), weight 67.9 kg, last menstrual period 03/02/2021, SpO2 97%.   General: No acute distress Mood and affect are appropriate Heart: Regular rate and rhythm no rubs murmurs or extra sounds Lungs: Clear to auscultation, breathing unlabored, no rales or wheezes Abdomen: Positive bowel sounds, soft nontender to palpation, nondistended Extremities: No clubbing, cyanosis, or edema Skin: No evidence of breakdown, no evidence of rash  Neuro: Drowsy but awake, cranial nerves II through XII grossly intact, follows commands, strength 4 out of 5 in bilateral upper and lower extremities, sensation intact in bilateral upper and lower extremities to light touch MSK.  No joint swelling noted   Prior neuro assessment is c/w today's exam 12/16/2023.   Assessment/Plan: 1. Functional deficits which require 3+ hours per day of interdisciplinary therapy in a comprehensive inpatient rehab setting. Physiatrist is providing close team supervision and 24 hour management of active medical problems listed below. Physiatrist and rehab team continue to  assess barriers to discharge/monitor patient progress toward functional and medical goals  Care Tool:  Bathing    Body parts bathed by patient: Right arm, Left arm, Chest, Abdomen, Front perineal area, Buttocks, Right upper leg, Left upper leg, Face, Right lower leg, Left lower leg   Body parts bathed by helper: Right lower leg, Left lower leg     Bathing assist Assist Level: Independent with assistive device     Upper Body Dressing/Undressing Upper body dressing   What is the patient wearing?: Pull over shirt    Upper body assist Assist Level: Independent with assistive device    Lower Body Dressing/Undressing Lower body dressing      What is the patient wearing?: Underwear/pull up, Pants     Lower body assist Assist for lower body dressing: Independent with assitive device     Toileting Toileting    Toileting assist Assist for toileting: Independent with assistive device     Transfers Chair/bed transfer  Transfers assist     Chair/bed transfer assist level: Supervision/Verbal cueing     Locomotion Ambulation   Ambulation assist      Assist level: Supervision/Verbal cueing Assistive device: Walker-rolling Max distance: 125'   Walk 10 feet activity   Assist     Assist  level: Supervision/Verbal cueing     Walk 50 feet activity   Assist    Assist level: Supervision/Verbal cueing      Walk 150 feet activity   Assist Walk 150 feet activity did not occur: Safety/medical concerns         Walk 10 feet on uneven surface  activity   Assist Walk 10 feet on uneven surfaces activity did not occur: Safety/medical concerns         Wheelchair     Assist Is the patient using a wheelchair?: Yes Type of Wheelchair: Manual    Wheelchair assist level: Dependent - Patient 0%      Wheelchair 50 feet with 2 turns activity    Assist        Assist Level: Dependent - Patient 0%   Wheelchair 150 feet activity     Assist       Assist Level: Dependent - Patient 0%   Blood pressure 105/76, pulse 75, temperature 98.4 F (36.9 C), temperature source Oral, resp. rate 18, height 5' 7 (1.702 m), weight 67.9 kg, last menstrual period 03/02/2021, SpO2 97%.   Medical Problem List and Plan: 1. 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  Antithrombotics: -DVT/anticoagulation:  Mechanical: Antiembolism stockings, thigh (TED hose) Bilateral lower extremities             -antiplatelet therapy: N/A 3. Pain Management/PTSD/bipolar: Oxycodone  as needed, consider adding scheduled pain medication  -hx of fibromyalgia  -11/18 increase oxycodone  PRN frequency to Q4h after discussion with patient  -11/20 will change oxycodone  to 10 mg only and set parameters for blood pressure less than 90 SBP  - 11/21 pain doing better.  Appears she is using oxycodone  less frequently.  Continue to monitor 4. Mood/Behavior/Sleep: Paxil  10 mg daily, melatonin 3 mg nightly, trazodone  75 mg daily, Atarax  25 mg every 6 hours as needed anxiety             -antipsychotic agents: Provide emotional support 5. Neuropsych/cognition: This patient is capable of making decisions on her own behalf. 6. Skin/Wound Care: Routine skin checks 7. Fluids/Electrolytes/Nutrition: Routine in and outs with follow-up chemistries 8.  Hemorrhagic shock.  Received 1 unit packed red blood cells 11/11.  Follow-up CBC 9.  Multiple electrolyte  abnormalities/hypocalcemia/hypokalemia/hypomagnesemia/hypophosphatemia.  Supplements ongoing follow-up chemistries -11/18 sodium and potassium stable, continue calcium  supplementation -11/20 sodium and potassium stable, calcium  a little bit higher at 7.4 -11/21 magnesium  level 1.6 start supplement with magnesium  oxide 10.  Severe hypothyroidism.  TSH 147 on admission.  Noncompliant with levothyroxine .  Continue Synthroid .  Follow-up TSH 2 weeks Has coarse skin, peripheral edema but no severe polyneuropathy  11.  Moderate malnutrition.  Dietary follow-up 12.  Questionable adrenal insufficiency.  Steroids were considered on previous [facility] admission.  She would need follow-up outpatient endocrinology as an outpatient. 13.  Hypoglycemia.  Continue to encourage p.o. intake  Still on D10W drip, will consult RD, may need oral or J tube supplementation so we can d/c IV D 10 14.  Anemia/thrombocytosis  - 11/18 hemoglobin 7.5 today, platelets 504 continue to monitor trend  - 11/20 hemoglobin low but overall stable at 7.9, platelets a little higher at 541 but likely reactive 15.  Abdominal pain and drainage around J-tube - 11/20 patient was evaluated by surgery today appreciate assistance.  Continue barrier cream and some leakage around J-tube felt to be normal.  Surgery reports they typically allow tubes to be in place for at least 4 weeks before removal.  She has follow-up with Dr. Bennet Floor on 12/4.   -10/21 discussed J-tube plan with patient again today, she will follow-up with outpatient surgery.  Pain appears to be improving  LOS: 5 days A FACE TO FACE EVALUATION WAS PERFORMED  Prentice FORBES Compton 12/16/2023, 2:09 PM

## 2023-12-16 NOTE — Progress Notes (Signed)
 Cbg at 79, patient given orange juice and ensure, meal tray infront of patient still untouched. Encouraged patient to eat her dinner, 118 mls of orange juice consumed by patient.

## 2023-12-17 ENCOUNTER — Inpatient Hospital Stay (HOSPITAL_COMMUNITY)

## 2023-12-17 LAB — GLUCOSE, CAPILLARY
Glucose-Capillary: 92 mg/dL (ref 70–99)
Glucose-Capillary: 73 mg/dL (ref 70–99)
Glucose-Capillary: 75 mg/dL (ref 70–99)
Glucose-Capillary: 75 mg/dL (ref 70–99)

## 2023-12-17 NOTE — Progress Notes (Signed)
 Occupational Therapy Session Note  Patient Details  Name: Renee [name] MRN: [id number] Date of Birth: February 23, 1975  Today's Date: 12/17/2023 OT Individual Time: 1330-1400 OT Individual Time Calculation (min): 30 min  and Today's Date: 12/17/2023 OT Missed Time: 30 Minutes Missed Time Reason: Nursing care;Other (comment) (on bed pan)   Short Term Goals: Week 1:  OT Short Term Goal 1 (Week 1): STGs=LTGs due to ELOS.  Skilled Therapeutic Interventions/Progress Updates:    Pt missed 30 mins skilled OT services at beginning of scheduled time 2/2 pt on bed pan and nursing care. Reviewed DME (already delivered and in room). Reviewed home safety recommendaitons. Reviewed energy conservation strategies. Pt reqeusted handdicap placard and CSW notfied via secure chat. Pt in considerable pain and declined OOB activties. Pt remained in bed with all needs within reach. Bed alarm activated.   Therapy Documentation Precautions:  Precautions Precautions: Fall Recall of Precautions/Restrictions: Intact Precaution/Restrictions Comments: J-tube, abdominal incision Restrictions Weight Bearing Restrictions Per Provider Order: No General: General OT Amount of Missed Time: 30 Minutes Pain: Pt c/o 9/10 abdomen pain; nursing and MD aware, emotional support   Therapy/Group: Individual Therapy  [name] 12/17/2023, 2:19 PM

## 2023-12-17 NOTE — Progress Notes (Signed)
 Occupational Therapy Note  Patient Details  Name: Renee [name] MRN: [id number] Date of Birth: 1975/06/24  Today's Date: 12/17/2023 OT Missed Time: 60 Minutes Missed Time Reason: Pain  Pt missed 60 min due to crying in pain in her abdomin. MD and RN aware. Pt unable to continue to participate and is worry something is wrong. Tried to reassure her and encourage but politely declined.    Claudene [facility] 12/17/2023, 3:00 PM

## 2023-12-17 NOTE — Progress Notes (Signed)
 Physical Therapy Discharge Summary  Patient Details  Name: Renee [name] MRN: [id number] Date of Birth: 1975-05-20  Date of Discharge from PT service:December 17, 2023  Today's Date: 12/17/2023 PT Individual Time: 0906-1017 PT Individual Time Calculation (min): 71 min    Patient has met 8 of 8 long term goals due to improved activity tolerance, improved balance, improved postural control, increased strength, and ability to compensate for deficits.  Patient to discharge at an ambulatory level Modified Independent.   Patient's care partner is independent to provide the necessary physical assistance at discharge.  Reasons goals not met: N/A  Recommendation:  Patient will benefit from ongoing skilled PT services in home health setting to continue to advance safe functional mobility, address ongoing impairments in activity tolerance, BLE strengthening, dynamic standing balance, and minimize fall risk.  Equipment: 16x16 standard WC  Reasons for discharge: treatment goals met and discharge from hospital  Patient/family agrees with progress made and goals achieved: Yes  PT Discharge Skilled Treatment Interventions: Pt presents in room in bed, agreeable to PT. Pt reporting feeling better from previous PT session but still continues to report pain in abdominal incision, unrated. Session focused on therapeutic activities to facilitate DC planning and upright tolerance as well as education on follow up services and HEP to complete at DC. Pt limited in session due to buttocks pain and requesting to have bowel movement during session. Pt completes bed mobility modI, increased time to complete, transfers modI with RW for all transfers during session. Pt ambulates modI 64' with RW, therapist managing IV pole. Pt educated on independent status however still needing assistance in room due to IV. Pt completes toilet transfer and 3/3 toileting tasks modI as well as donning pants, and shoes, pt unsuccessful  for BM however is continent of voiding bladder, charted. Pt completes completes 4 steps with RHR ascending with supervision, step to gait pattern however pt reporting pain with this and requesting to stop after 4 steps. Pt unable to complete car transfer due to bathroom urgency, hwoever simulated with bed transfer, modI. Pt educated on the following exercises however does not complete them secondary to fatigue and requesting to rest.  Access Code: [email or web address] URL: [email or web address] Date: 12/17/2023 Prepared by: Reche Ohara  Exercises - Supine Bridge  - 1 x daily - 7 x weekly - 3 sets - 10 reps - Standing March with Counter Support  - 1 x daily - 7 x weekly - 3 sets - 20 reps - Mini Squat with Counter Support  - 1 x daily - 7 x weekly - 3 sets - 10 reps - Heel Raises with Counter Support  - 1 x daily - 7 x weekly - 3 sets - 20 reps - Standing Hamstring Curl with RW  - 1 x daily - 7 x weekly - 3 sets - 20 reps  Pt remains semi reclined in bed with all needs within reach, call light in place at end of session.   Precautions/Restrictions Precautions Precautions: Fall Recall of Precautions/Restrictions: Intact Precaution/Restrictions Comments: J-tube, abdominal incision Restrictions Weight Bearing Restrictions Per Provider Order: No Pain Interference Pain Interference Pain Effect on Sleep: 4. Almost constantly Pain Interference with Therapy Activities: 4. Almost constantly Pain Interference with Day-to-Day Activities: 4. Almost constantly Cognition Overall Cognitive Status: Within Functional Limits for tasks assessed Arousal/Alertness: Awake/alert Orientation Level: Oriented X4 Sensation Sensation Light Touch: Impaired Detail Peripheral sensation comments: Reports cervical sternosis and carpal tunnel. Hot/Cold: Not tested Proprioception: Not tested Stereognosis: Impaired Detail Coordination Gross Motor  Movements are Fluid and Coordinated: No Fine Motor Movements  are Fluid and Coordinated: Yes Coordination and Movement Description: Deficits due to generalized weakness/debility (improved since evaluation). Motor  Motor Motor: Other (comment) Motor - Discharge Observations: deficits due to generalized weakness/debility, improved from eval  Mobility Bed Mobility Bed Mobility: Supine to Sit;Sit to Supine Supine to Sit: Independent Sit to Supine: Independent Transfers Transfers: Sit to Stand;Stand to Sit;Stand Pivot Transfers Sit to Stand: Independent with assistive device Stand to Sit: Independent with assistive device Stand Pivot Transfers: Independent with assistive device Transfer (Assistive device): Rolling walker Locomotion  Gait Ambulation: Yes Gait Assistance: Independent with assistive device Gait Distance (Feet): 150 Feet Assistive device: Rolling walker Gait Gait: Yes Gait Pattern: Impaired Gait Pattern: Trunk flexed Stairs / Additional Locomotion Stairs: Yes Stairs Assistance: Supervision/Verbal cueing Stair Management Technique: One rail Right Number of Stairs: 4 Height of Stairs: 6 Wheelchair Mobility Wheelchair Mobility: No  Trunk/Postural Assessment  Cervical Assessment Cervical Assessment: Exceptions to [facility] (Forward head) Thoracic Assessment Thoracic Assessment: Exceptions to [facility] (Rounded shoulders) Lumbar Assessment Lumbar Assessment: Exceptions to [facility] (Posterior pelvic tilt) Postural Control Postural Control: Within Functional Limits  Balance Balance Balance Assessed: Yes Static Sitting Balance Static Sitting - Balance Support: Feet supported Static Sitting - Level of Assistance: 7: Independent Dynamic Sitting Balance Dynamic Sitting - Balance Support: During functional activity Dynamic Sitting - Level of Assistance: 6: Modified independent (Device/Increase time) Static Standing Balance Static Standing - Balance Support: During functional activity Static Standing - Level of Assistance: 6: Modified  independent (Device/Increase time) Dynamic Standing Balance Dynamic Standing - Balance Support: During functional activity Dynamic Standing - Level of Assistance: 6: Modified independent (Device/Increase time) Extremity Assessment  RLE Assessment RLE Assessment: Exceptions to [facility] General Strength Comments: grossly 4/5 assessed functionally LLE Assessment LLE Assessment: Exceptions to [facility] General Strength Comments: grossly 4/5 assessed functionally   Reche Ohara PT, DPT 12/17/2023, 12:20 PM

## 2023-12-17 NOTE — Progress Notes (Signed)
 Met with patient to review current situation, team conference and plan of care. Reviewed medications, bowel, bladder sleep and pain. Reviewed incision care and follow up with PCP on discharge. Continue to follow along o provide educational needs to facilitate preparation for discharge.

## 2023-12-17 NOTE — Plan of Care (Signed)
  Problem: RH Balance Goal: LTG Patient will maintain dynamic standing balance (PT) Description: LTG:  Patient will maintain dynamic standing balance with assistance during mobility activities (PT) Outcome: Completed/Met   Problem: Sit to Stand Goal: LTG:  Patient will perform sit to stand with assistance level (PT) Description: LTG:  Patient will perform sit to stand with assistance level (PT) Outcome: Completed/Met   Problem: RH Bed Mobility Goal: LTG Patient will perform bed mobility with assist (PT) Description: LTG: Patient will perform bed mobility with assistance, with/without cues (PT). Outcome: Completed/Met   Problem: RH Bed to Chair Transfers Goal: LTG Patient will perform bed/chair transfers w/assist (PT) Description: LTG: Patient will perform bed to chair transfers with assistance (PT). Outcome: Completed/Met   Problem: RH Car Transfers Goal: LTG Patient will perform car transfers with assist (PT) Description: LTG: Patient will perform car transfers with assistance (PT). Outcome: Completed/Met   Problem: RH Ambulation Goal: LTG Patient will ambulate in controlled environment (PT) Description: LTG: Patient will ambulate in a controlled environment, # of feet with assistance (PT). Outcome: Completed/Met Goal: LTG Patient will ambulate in home environment (PT) Description: LTG: Patient will ambulate in home environment, # of feet with assistance (PT). Outcome: Completed/Met   Problem: RH Stairs Goal: LTG Patient will ambulate up and down stairs w/assist (PT) Description: LTG: Patient will ambulate up and down # of stairs with assistance (PT) Outcome: Completed/Met Note: Pt has had RHR installed and pt able to complete with RHR with supervision

## 2023-12-17 NOTE — Progress Notes (Signed)
 PROGRESS NOTE    Subjective/Complaints:  No issues overnight, abd pain this am took 2 oxy IR tabs this am but requesting more, no vomiting , ate 75% breakfast Last BM 11/21  ROS: Patient denies , dizziness, nausea, vomiting, diarrhea,  shortness of breath or chest pain,  + Abdominal pain- mainly around J tube    Objective:   No results found. No results for input(s): WBC, HGB, HCT, PLT in the last 72 hours.  No results for input(s): NA, K, CL, CO2, GLUCOSE, BUN, CREATININE, CALCIUM  in the last 72 hours.   Intake/Output Summary (Last 24 hours) at 12/17/2023 1116 Last data filed at 12/17/2023 0800 Gross per 24 hour  Intake 1131.14 ml  Output 5 ml  Net 1126.14 ml        Physical Exam: Vital Signs Blood pressure 101/79, pulse 72, temperature 98.5 F (36.9 C), temperature source Oral, resp. rate 18, height 5' 7 (1.702 m), weight 67.9 kg, last menstrual period 03/02/2021, SpO2 98%.   General: No acute distress Mood and affect are appropriate Heart: Regular rate and rhythm no rubs murmurs or extra sounds Lungs: Clear to auscultation, breathing unlabored, no rales or wheezes Abdomen: Positive bowel sounds, soft nontender to palpation, nondistended, J tube site CDI , no bleeding or drainage, midline abd incision with granulation tissue W->D dressing  Extremities: No clubbing, cyanosis, or edema Skin: No evidence of breakdown, no evidence of rash  Neuro: Drowsy but awake, cranial nerves II through XII grossly intact, follows commands, strength 4 out of 5 in bilateral upper and lower extremities, sensation intact in bilateral upper and lower extremities to light touch MSK.  No joint swelling noted   Prior neuro assessment is c/w today's exam 12/17/2023.   Assessment/Plan: 1. Functional deficits which require 3+ hours per day of interdisciplinary therapy in a comprehensive inpatient rehab  setting. Physiatrist is providing close team supervision and 24 hour management of active medical problems listed below. Physiatrist and rehab team continue to assess barriers to discharge/monitor patient progress toward functional and medical goals  Care Tool:  Bathing    Body parts bathed by patient: Right arm, Left arm, Chest, Abdomen, Front perineal area, Buttocks, Right upper leg, Left upper leg, Face, Right lower leg, Left lower leg   Body parts bathed by helper: Right lower leg, Left lower leg     Bathing assist Assist Level: Independent with assistive device     Upper Body Dressing/Undressing Upper body dressing   What is the patient wearing?: Pull over shirt    Upper body assist Assist Level: Independent with assistive device    Lower Body Dressing/Undressing Lower body dressing      What is the patient wearing?: Underwear/pull up, Pants     Lower body assist Assist for lower body dressing: Independent with assitive device     Toileting Toileting    Toileting assist Assist for toileting: Independent with assistive device     Transfers Chair/bed transfer  Transfers assist     Chair/bed transfer assist level: Supervision/Verbal cueing     Locomotion Ambulation   Ambulation assist      Assist level: Supervision/Verbal cueing Assistive device: Walker-rolling Max distance: 125'  Walk 10 feet activity   Assist     Assist level: Supervision/Verbal cueing     Walk 50 feet activity   Assist    Assist level: Supervision/Verbal cueing      Walk 150 feet activity   Assist Walk 150 feet activity did not occur: Safety/medical concerns         Walk 10 feet on uneven surface  activity   Assist Walk 10 feet on uneven surfaces activity did not occur: Safety/medical concerns         Wheelchair     Assist Is the patient using a wheelchair?: Yes Type of Wheelchair: Manual    Wheelchair assist level: Dependent - Patient 0%       Wheelchair 50 feet with 2 turns activity    Assist        Assist Level: Dependent - Patient 0%   Wheelchair 150 feet activity     Assist      Assist Level: Dependent - Patient 0%   Blood pressure 101/79, pulse 72, temperature 98.5 F (36.9 C), temperature source Oral, resp. rate 18, height 5' 7 (1.702 m), weight 67.9 kg, last menstrual period 03/02/2021, SpO2 98%.   Medical Problem List and Plan: 1. 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  Antithrombotics: -DVT/anticoagulation:  Mechanical: Antiembolism stockings, thigh (TED hose) Bilateral lower extremities             -antiplatelet therapy: N/A 3. Pain Management/PTSD/bipolar: Oxycodone  as needed, consider adding scheduled pain medication  -hx of fibromyalgia  -11/18 increase oxycodone  PRN frequency to Q4h after discussion with patient  -11/20 will change oxycodone  to 10 mg only and set parameters for blood pressure less than 90 SBP  - 11/21 pain doing better.  Appears she is using oxycodone  less frequently.  Continue to monitor 4. Mood/Behavior/Sleep: Paxil  10 mg daily, melatonin 3 mg nightly, trazodone  75 mg daily, Atarax  25 mg every 6 hours as needed anxiety             -antipsychotic agents: Provide emotional support 5. Neuropsych/cognition: This patient is capable of making decisions on her own behalf. 6. Skin/Wound Care: Routine skin checks 7. Fluids/Electrolytes/Nutrition: Routine in and  outs with follow-up chemistries 8.  Hemorrhagic shock.  Received 1 unit packed red blood cells 11/11.  Follow-up CBC 9.  Multiple electrolyte abnormalities/hypocalcemia/hypokalemia/hypomagnesemia/hypophosphatemia.  Supplements ongoing follow-up chemistries -11/18 sodium and potassium stable, continue calcium  supplementation -11/20 sodium and potassium stable, calcium  a little bit higher at 7.4 -11/21 magnesium  level 1.6 start supplement with magnesium  oxide 10.  Severe hypothyroidism.  TSH 147 on admission.  Noncompliant with levothyroxine .  Continue Synthroid .  Follow-up TSH 2 weeks Has coarse skin, peripheral edema but no severe polyneuropathy  11.  Moderate malnutrition.  Dietary follow-up 12.  Questionable adrenal insufficiency.  Steroids were considered on previous [facility] admission.  She would need follow-up outpatient endocrinology as  an outpatient. 13.  Hypoglycemia.  Continue to encourage p.o. intake  Still on D10W drip, will consult RD, may need oral or J tube supplementation so we can d/c IV D 10- monitor CBGs, avoid simple sugars, advise complex carbs , RD consulting as well.  Hope to keep CBGs >55 with just po  snacks such as fruit or whole grains 14.  Anemia/thrombocytosis  - 11/18 hemoglobin 7.5 today, platelets 504 continue to monitor trend  - 11/20 hemoglobin low but overall stable at 7.9, platelets a little higher at 541 but likely reactive 15.  Abdominal pain and drainage around J-tube - 11/20 patient was evaluated by surgery today appreciate assistance.  Continue barrier cream and some leakage around J-tube felt to be normal.  Surgery reports they typically allow tubes to be in place for at least 4 weeks before removal.  She has follow-up with Dr. Bennet Floor on 12/4.   -11/21 discussed J-tube plan with patient again today, she will follow-up with outpatient surgery.   11/23  had a good day with paih yesterday but more around J tube today , exam unremarkable , no BM x 2 , will  recheck KUB, avoid escalation of pain meds due to GI issues LOS: 6 days A FACE TO FACE EVALUATION WAS PERFORMED  Prentice FORBES Compton 12/17/2023, 11:16 AM

## 2023-12-18 ENCOUNTER — Other Ambulatory Visit (HOSPITAL_COMMUNITY): Payer: Self-pay

## 2023-12-18 DIAGNOSIS — K5900 Constipation, unspecified: Secondary | ICD-10-CM

## 2023-12-18 LAB — CBC
HCT: 28.6 % — ABNORMAL LOW (ref 36.0–46.0)
Hemoglobin: 8.7 g/dL — ABNORMAL LOW (ref 12.0–15.0)
MCH: 30.6 pg (ref 26.0–34.0)
MCHC: 30.4 g/dL (ref 30.0–36.0)
MCV: 100.7 fL — ABNORMAL HIGH (ref 80.0–100.0)
Platelets: 581 K/uL — ABNORMAL HIGH (ref 150–400)
RBC: 2.84 MIL/uL — ABNORMAL LOW (ref 3.87–5.11)
RDW: 18.6 % — ABNORMAL HIGH (ref 11.5–15.5)
WBC: 6.5 K/uL (ref 4.0–10.5)
nRBC: 0 % (ref 0.0–0.2)

## 2023-12-18 LAB — GLUCOSE, CAPILLARY
Glucose-Capillary: 68 mg/dL — ABNORMAL LOW (ref 70–99)
Glucose-Capillary: 68 mg/dL — ABNORMAL LOW (ref 70–99)
Glucose-Capillary: 72 mg/dL (ref 70–99)
Glucose-Capillary: 74 mg/dL (ref 70–99)
Glucose-Capillary: 83 mg/dL (ref 70–99)
Glucose-Capillary: 83 mg/dL (ref 70–99)

## 2023-12-18 LAB — BASIC METABOLIC PANEL WITH GFR
Anion gap: 8 (ref 5–15)
BUN: 9 mg/dL (ref 6–20)
CO2: 22 mmol/L (ref 22–32)
Calcium: 7.7 mg/dL — ABNORMAL LOW (ref 8.9–10.3)
Chloride: 108 mmol/L (ref 98–111)
Creatinine, Ser: 0.5 mg/dL (ref 0.44–1.00)
GFR, Estimated: >60 mL/min (ref >60)
Glucose, Bld: 72 mg/dL (ref 70–99)
Potassium: 3.8 mmol/L (ref 3.5–5.1)
Sodium: 138 mmol/L (ref 135–145)

## 2023-12-18 LAB — MAGNESIUM: Magnesium: 1.7 mg/dL (ref 1.7–2.4)

## 2023-12-18 MED ORDER — VITAMIN A 3 MG (10000 UNIT) PO CAPS
ORAL_CAPSULE | ORAL | 0 refills | Status: AC
  Filled 2023-12-18: qty 30, 6d supply, fill #0

## 2023-12-18 MED ORDER — COPPER 2 MG PO TABS
0 refills | Status: AC
  Filled 2023-12-18: qty 23, 10d supply, fill #0

## 2023-12-18 MED ORDER — CALCIUM CITRATE 950 (200 CA) MG PO TABS
200.0000 mg | ORAL_TABLET | Freq: Three times a day (TID) | ORAL | 0 refills | Status: DC
  Filled 2023-12-18: qty 90, 30d supply, fill #0

## 2023-12-18 MED ORDER — SIMETHICONE 80 MG PO CHEW
80.0000 mg | CHEWABLE_TABLET | Freq: Four times a day (QID) | ORAL | Status: DC | PRN
Start: 2023-12-18 — End: 2023-12-18

## 2023-12-18 MED ORDER — OXYCODONE HCL 10 MG PO TABS
10.0000 mg | ORAL_TABLET | ORAL | 0 refills | Status: DC | PRN
  Filled 2023-12-18: qty 30, 5d supply, fill #0

## 2023-12-18 MED ORDER — PAROXETINE HCL 10 MG PO TABS
10.0000 mg | ORAL_TABLET | Freq: Every day | ORAL | 0 refills | Status: DC
  Filled 2023-12-18: qty 30, 30d supply, fill #0

## 2023-12-18 MED ORDER — VITAMIN D (ERGOCALCIFEROL) 1.25 MG (50000 UNIT) PO CAPS
50000.0000 [IU] | ORAL_CAPSULE | ORAL | 0 refills | Status: DC
  Filled 2023-12-18: qty 5, 35d supply, fill #0

## 2023-12-18 MED ORDER — SELENIUM 50 MCG PO TABS
100.0000 ug | ORAL_TABLET | Freq: Every day | ORAL | 0 refills | Status: AC
  Filled 2023-12-18: qty 60, 30d supply, fill #0

## 2023-12-18 MED ORDER — OMEPRAZOLE 40 MG PO CPDR
40.0000 mg | DELAYED_RELEASE_CAPSULE | Freq: Two times a day (BID) | ORAL | 0 refills | Status: DC
  Filled 2023-12-18: qty 60, 30d supply, fill #0

## 2023-12-18 MED ORDER — HYDROXYZINE HCL 25 MG PO TABS
25.0000 mg | ORAL_TABLET | Freq: Four times a day (QID) | ORAL | 0 refills | Status: DC | PRN
  Filled 2023-12-18: qty 10, 3d supply, fill #0

## 2023-12-18 MED ORDER — THIAMINE HCL 100 MG PO TABS
100.0000 mg | ORAL_TABLET | Freq: Every day | ORAL | 0 refills | Status: DC
  Filled 2023-12-18: qty 30, 30d supply, fill #0

## 2023-12-18 MED ORDER — ZINC SULFATE 220 (50 ZN) MG PO TABS
220.0000 mg | ORAL_TABLET | Freq: Every day | ORAL | 0 refills | Status: DC
  Filled 2023-12-18: qty 30, 30d supply, fill #0

## 2023-12-18 MED ORDER — MAGNESIUM OXIDE -MG SUPPLEMENT 400 (240 MG) MG PO TABS
400.0000 mg | ORAL_TABLET | Freq: Two times a day (BID) | ORAL | 0 refills | Status: DC
  Filled 2023-12-18: qty 60, 30d supply, fill #0

## 2023-12-18 MED ORDER — LEVOTHYROXINE SODIUM 200 MCG PO TABS
200.0000 ug | ORAL_TABLET | Freq: Every day | ORAL | 0 refills | Status: DC
  Filled 2023-12-18: qty 30, 30d supply, fill #0

## 2023-12-18 MED ORDER — FLUTICASONE PROPIONATE 50 MCG/ACT NA SUSP
2.0000 | Freq: Every day | NASAL | 0 refills | Status: DC | PRN
  Filled 2023-12-18: qty 16, 30d supply, fill #0

## 2023-12-18 MED ORDER — FLORANEX PO PACK
1.0000 g | PACK | Freq: Three times a day (TID) | ORAL | 0 refills | Status: AC
  Filled 2023-12-18: qty 90, 30d supply, fill #0

## 2023-12-18 MED ORDER — TRAZODONE HCL 150 MG PO TABS
75.0000 mg | ORAL_TABLET | Freq: Every day | ORAL | 0 refills | Status: DC
  Filled 2023-12-18: qty 15, 30d supply, fill #0

## 2023-12-18 MED ORDER — ASCORBIC ACID 500 MG PO TABS
500.0000 mg | ORAL_TABLET | Freq: Every day | ORAL | 0 refills | Status: AC
  Filled 2023-12-18: qty 30, 30d supply, fill #0

## 2023-12-18 MED ORDER — MONTELUKAST SODIUM 10 MG PO TABS
10.0000 mg | ORAL_TABLET | Freq: Every day | ORAL | 0 refills | Status: DC
  Filled 2023-12-18: qty 30, 30d supply, fill #0

## 2023-12-18 MED ORDER — MELATONIN 3 MG PO TABS
3.0000 mg | ORAL_TABLET | Freq: Every day | ORAL | 0 refills | Status: AC
  Filled 2023-12-18: qty 30, 30d supply, fill #0

## 2023-12-18 MED ORDER — VITAMIN E 180 MG (400 UNIT) PO CAPS
400.0000 [IU] | ORAL_CAPSULE | Freq: Every day | ORAL | 0 refills | Status: AC
  Filled 2023-12-18: qty 30, 30d supply, fill #0

## 2023-12-18 MED ORDER — FOLIC ACID 1 MG PO TABS
1.0000 mg | ORAL_TABLET | Freq: Every day | ORAL | 0 refills | Status: DC
  Filled 2023-12-18: qty 30, 30d supply, fill #0

## 2023-12-18 NOTE — Progress Notes (Signed)
 At 0500 had patient do abdominal wet to dry dressing change and j tube dressing change. Set up assist provided by gathering supplies and patient talked through the steps. Patient able to perform task to completion. Patient also shown how to flush j tube for home. All questions answered. Report passed to next shift. Extra supplies at bedside.

## 2023-12-18 NOTE — Progress Notes (Signed)
 Inpatient Rehabilitation Discharge Medication Review by a Pharmacist  A complete drug regimen review was completed for this patient to identify any potential clinically significant medication issues.   High Risk Drug Classes Is patient taking? Indication by Medication  Antipsychotic No    Anticoagulant No    Antibiotic No    Opioid Yes Oxycodone  - pain  Antiplatelet No    Hypoglycemics/insulin No    Vasoactive Medication No    Chemotherapy No    Other Yes Acetaminophen - pain management Adderall- ADD Hydroxyzine  - anxiety Montelukast , Flonase - allergies Omeprazole  - GERD Paxil  - depression Synthroid  - hypothyroidism Trazodone , melatonin - sleep Zinc , vit E, vit A, selenium , MVI, folic acid , copper , calcium , vit C,  Vitamin D , thiamine , magnesium  oxide, lactobacillus - supplementation        Type of Medication Issue Identified Description of Issue Recommendation(s)  Drug Interaction(s) (clinically significant)        Duplicate Therapy        Allergy        No Medication Administration End Date        Incorrect Dose        Additional Drug Therapy Needed        Significant med changes from prior encounter (inform family/care partners about these prior to discharge).      Other          Clinically significant medication issues were identified that warrant physician communication and completion of prescribed/recommended actions by midnight of the next day:  No   Name of provider notified for urgent issues identified:    Provider Method of Notification:       Pharmacist comments:    Time spent performing this drug regimen review (minutes): 20   Levorn Gaskins, RPh Clinical Pharmacist 12/18/2023 9:00 AM

## 2023-12-18 NOTE — Progress Notes (Signed)
 Home supplies given to patient; patient to do own dressing and flush tube before discharge nurse in room.

## 2023-12-18 NOTE — Progress Notes (Signed)
 Inpatient Rehabilitation Care Coordinator Discharge Note   Patient Details  Name: Renee [name] MRN: [id number] Date of Birth: [birth year]   Discharge location: D/c to home  Length of Stay: 6 days  Discharge activity level: Mod I  Home/community participation: Limited  Patient response un:Yzjouy Literacy - How often do you need to have someone help you when you read instructions, pamphlets, or other written material from your doctor or pharmacy?: Rarely  Patient response un:Dnrpjo Isolation - How often do you feel lonely or isolated from those around you?: Rarely  Services provided included: MD, RD, OT, PT, RN, CM, TR, Neuropsych, SW, Pharmacy  Financial Services:  Field Seismologist Utilized: Private Insurance [place] [employer] [place]  Choices offered to/list presented to: patient  Follow-up services arranged:  Outpatient, DME    Outpatient Servicies: [facility] [address] location for outpatient PT/OT DME : [facility] for w/c, TTB, 3in1 BSC    Patient response to transportation need: Is the patient able to respond to transportation needs?: Yes In the past 12 months, has lack of transportation kept you from medical appointments or from getting medications?: No In the past 12 months, has lack of transportation kept you from meetings, work, or from getting things needed for daily living?: No   Patient/Family verbalized understanding of follow-up arrangements:  Yes  Individual responsible for coordination of the follow-up plan: contact pt  Confirmed correct DME delivered: Graeme [name] [name] 12/18/2023    Comments (or additional information):  Summary of Stay    Date/Time Discharge Planning CSW  12/11/23 1412 TBA AAC       Aron Needles A [name]

## 2023-12-18 NOTE — Progress Notes (Signed)
 Patient able to successfully perform own dressing change. Pat applied new split gauze to J-tube. PT able to demonstrate understanding of dressing changes, how often and what supplies is needed. All other questions met at this time.

## 2023-12-18 NOTE — Progress Notes (Signed)
 Hypoglycemic Event  CBG: 68  Treatment: 4 oz juice/soda- pt requested another ensure, also eating snack at bedside  Symptoms: None  Follow-up CBG: Time:1228 CBG Result:83  Possible Reasons for Event: Unknown  Comments/MD notified: Treated per standing order    Katheryne Gorr, Rosina Jansky

## 2023-12-18 NOTE — Plan of Care (Signed)
  Problem: RH Balance Goal: LTG Patient will maintain dynamic standing with ADLs (OT) Description: LTG:  Patient will maintain dynamic standing balance with assist during activities of daily living (OT)  Outcome: Completed/Met   Problem: RH Bathing Goal: LTG Patient will bathe all body parts with assist levels (OT) Description: LTG: Patient will bathe all body parts with assist levels (OT) Outcome: Completed/Met   Problem: RH Dressing Goal: LTG Patient will perform lower body dressing w/assist (OT) Description: LTG: Patient will perform lower body dressing with assist, with/without cues in positioning using equipment (OT) Outcome: Completed/Met   Problem: RH Toileting Goal: LTG Patient will perform toileting task (3/3 steps) with assistance level (OT) Description: LTG: Patient will perform toileting task (3/3 steps) with assistance level (OT)  Outcome: Completed/Met   Problem: RH Toilet Transfers Goal: LTG Patient will perform toilet transfers w/assist (OT) Description: LTG: Patient will perform toilet transfers with assist, with/without cues using equipment (OT) Outcome: Completed/Met

## 2023-12-18 NOTE — Progress Notes (Addendum)
 PROGRESS NOTE    Subjective/Complaints:  Pain controlled this AM. Reports so GI gas. Looking forward to going home today.     Last BM 11/21  ROS: Patient denies , dizziness, nausea, vomiting, diarrhea,  shortness of breath or chest pain,  + Abdominal pain- mainly around J tube + GI gas   Objective:   DG Abd 1 View Result Date: 12/17/2023 EXAM: 1 VIEW XRAY OF THE ABDOMEN 12/17/2023 11:40:00 AM COMPARISON: Comparison with 12/07/2023. CLINICAL HISTORY: Abdominal pain. FINDINGS: LINES, TUBES AND DEVICES: Left mid-abdominal percutaneous catheter in place without change in position. BOWEL: Diffusely distended colon filled with stool and gas. This is progressing since previous study and likely represents constipation and/or ileus. No small bowel distention. SOFT TISSUES: Surgical clips are present. No radiopaque stones. Visualized soft tissue contours appear intact. BONES: No acute osseous abnormality. Degenerative changes in the spine and hips. VISUALIZED LUNG BASES: Visualized lung bases are clear. IMPRESSION: 1. Progressive distention of the colon filled with stool and gas, likely representing constipation and/or ileus. 2. No small bowel distention. Electronically signed by: Elsie Gravely MD 12/17/2023 12:21 PM EST RP Workstation: [id number]   Recent Labs    12/18/23 0448  WBC 6.5  HGB 8.7*  HCT 28.6*  PLT 581*    Recent Labs    12/18/23 0448  NA 138  K 3.8  CL 108  CO2 22  GLUCOSE 72  BUN 9  CREATININE 0.50  CALCIUM  7.7*     Intake/Output Summary (Last 24 hours) at 12/18/2023 0844 Last data filed at 12/18/2023 0500 Gross per 24 hour  Intake 30 ml  Output --  Net 30 ml        Physical Exam: Vital Signs Blood pressure 101/62, pulse 70, temperature 98 F (36.7 C), temperature source Oral, resp. rate 18, height 5' 7 (1.702 m), weight 67.9 kg, last menstrual period 03/02/2021, SpO2 98%.   General: No acute  distress Mood and affect are appropriate Heart: Regular rate and rhythm no rubs murmurs or extra sounds Lungs: Clear to auscultation, breathing unlabored, no rales or wheezes Abdomen: Positive bowel sounds, soft nontender to palpation, nondistended, J tube site CDI , no bleeding or drainage, midline abd incision with granulation tissue W->D dressing  Extremities: No clubbing, cyanosis, or edema Skin: No evidence of breakdown, no evidence of rash  Neuro: Drowsy but awake, cranial nerves II through XII grossly intact, follows commands, strength 4 out of 5 in bilateral upper and lower extremities, sensation intact in bilateral upper and lower extremities to light touch MSK.  No joint swelling noted   Prior neuro assessment is c/w today's exam 12/18/2023.   Assessment/Plan: 1. Functional deficits which require 3+ hours per day of interdisciplinary therapy in a comprehensive inpatient rehab setting. Physiatrist is providing close team supervision and 24 hour management of active medical problems listed below. Physiatrist and rehab team continue to assess barriers to discharge/monitor patient progress toward functional and medical goals  Care Tool:  Bathing    Body parts bathed by patient: Right arm, Left arm, Chest, Abdomen, Front perineal area, Buttocks, Right upper leg, Left upper leg, Face, Right lower leg, Left lower leg   Body parts  bathed by helper: Right lower leg, Left lower leg     Bathing assist Assist Level: Independent with assistive device     Upper Body Dressing/Undressing Upper body dressing   What is the patient wearing?: Pull over shirt    Upper body assist Assist Level: Independent with assistive device    Lower Body Dressing/Undressing Lower body dressing      What is the patient wearing?: Underwear/pull up, Pants     Lower body assist Assist for lower body dressing: Independent with assitive device     Toileting Toileting    Toileting assist Assist for  toileting: Independent with assistive device     Transfers Chair/bed transfer  Transfers assist     Chair/bed transfer assist level: Independent with assistive device Chair/bed transfer assistive device: Geologist, Engineering   Ambulation assist      Assist level: Independent with assistive device Assistive device: Walker-rolling Max distance: 190'   Walk 10 feet activity   Assist     Assist level: Independent with assistive device Assistive device: Walker-rolling   Walk 50 feet activity   Assist    Assist level: Independent with assistive device Assistive device: Walker-rolling    Walk 150 feet activity   Assist Walk 150 feet activity did not occur: Safety/medical concerns  Assist level: Independent with assistive device Assistive device: Walker-rolling    Walk 10 feet on uneven surface  activity   Assist Walk 10 feet on uneven surfaces activity did not occur: Safety/medical concerns   Assist level: Supervision/Verbal cueing Assistive device: Walker-rolling   Wheelchair     Assist Is the patient using a wheelchair?: Yes Type of Wheelchair: Manual    Wheelchair assist level: Independent      Wheelchair 50 feet with 2 turns activity    Assist        Assist Level: Independent   Wheelchair 150 feet activity     Assist      Assist Level: Independent   Blood pressure 101/62, pulse 70, temperature 98 F (36.7 C), temperature source Oral, resp. rate 18, height 5' 7 (1.702 m), weight 67.9 kg, last menstrual period 03/02/2021, SpO2 98%.   Medical Problem List and Plan: 1. Functional deficits secondary to debility secondary to mesenteric ischemia/pseudo Petersons internal hernia status post emergent  laparotomy,placement of wound VAC and closure of internal hernia defect 11/26/2023 with CVC insertion 11/3 as well as 11/4 small bowel resection jejunostomy tube insertion closure of abdomen             -patient may not  shower             -ELOS/Goals: 7-10d Mod I  - Continue CIR  - Expected discharge 11/24- today  - Boyfriend may not be able to assist her after discharge, she is getting close to mod I so may not hold up her discharge  2.  Antithrombotics: -DVT/anticoagulation:  Mechanical: Antiembolism stockings, thigh (TED hose) Bilateral lower extremities             -antiplatelet therapy: N/A 3. Pain Management/PTSD/bipolar: Oxycodone  as needed, consider adding scheduled pain medication  -hx of fibromyalgia  -11/18 increase oxycodone  PRN frequency to Q4h after discussion with patient  -11/20 will change oxycodone  to 10 mg only and set parameters for blood pressure less than 90 SBP  - 11/21 pain doing better.  Appears she is using oxycodone  less frequently.  Continue to monitor 4. Mood/Behavior/Sleep: Paxil  10 mg daily, melatonin 3 mg nightly, trazodone  75 mg  daily, Atarax  25 mg every 6 hours as needed anxiety             -antipsychotic agents: Provide emotional support 5. Neuropsych/cognition: This patient is capable of making decisions on her own behalf. 6. Skin/Wound Care: Routine skin checks 7. Fluids/Electrolytes/Nutrition: Routine in and outs with follow-up chemistries 8.  Hemorrhagic shock.  Received 1 unit packed red blood cells 11/11.  Follow-up CBC 9.  Multiple electrolyte abnormalities/hypocalcemia/hypokalemia/hypomagnesemia/hypophosphatemia.  Supplements ongoing follow-up chemistries -11/18 sodium and potassium stable, continue calcium  supplementation -11/20 sodium and potassium stable, calcium  a little bit higher at 7.4 -11/21 magnesium  level 1.6 start supplement with magnesium  oxide -11/24, mg up to 1.7 10.  Severe hypothyroidism.  TSH 147 on admission.  Noncompliant with levothyroxine .  Continue Synthroid .  Follow-up TSH 2 weeks Has coarse skin, peripheral edema but no severe polyneuropathy  11.  Moderate malnutrition.  Dietary follow-up 12.  Questionable adrenal insufficiency.  Steroids  were considered on previous [facility] admission.  She would need follow-up outpatient endocrinology as an outpatient. 13.  Hypoglycemia.  Continue to encourage p.o. intake  Still on D10W drip, will consult RD, may need oral or J tube supplementation so we can d/c IV D 10- monitor CBGs, avoid simple sugars, advise complex carbs , RD consulting as well.  Hope to keep CBGs >55 with just po  snacks such as fruit or whole grains CBG (last 3)   -11/24 D10 has been stopped,patient was seen by RD yesterday, provided recommendations.  Continue to encourage regular Po intake  Recent Labs    12/18/23 0431 12/18/23 0804 12/18/23 1153  GLUCAP 74 83 72    14.  Anemia/thrombocytosis  - 11/18 hemoglobin 7.5 today, platelets 504 continue to monitor trend  - 11/20 hemoglobin low but overall stable at 7.9, platelets a little higher at 541 but likely reactive  11/24,Hemoglobin 8.7 today 15.  Abdominal pain and drainage around J-tube - 11/20 patient was evaluated by surgery today appreciate assistance.  Continue barrier cream and some leakage around J-tube felt to be normal.  Surgery reports they typically allow tubes to be in place for at least 4 weeks before removal.  She has follow-up with Dr. Bennet Floor on 12/4.   -11/21 discussed J-tube plan with patient again today, she will follow-up with outpatient surgery.   11/23  had a good day with paih yesterday but more around J tube today , exam unremarkable , no BM x 2 , will recheck KUB, avoid escalation of pain meds due to GI issues  -KUB with distention of colon with stool and gas likely representing constipation or ileus.  -Do not suspect she has ileus Likely constipation, she feels okay overall not reporting nausea and she had 2 large bowel movements today  -Order simethicone  LOS: 7 days A FACE TO FACE EVALUATION WAS PERFORMED  Murray Collier 12/18/2023, 8:44 AM

## 2023-12-18 NOTE — Progress Notes (Signed)
 Hypoglycemic Event  CBG: 68  Treatment: 8 oz juice/soda  Symptoms: None  Follow-up CBG: Time:0430 CBG Result:74  Possible Reasons for Event: Unknown  Comments/MD notified:Treated per standing order    Carline Dura, Rosina Jansky

## 2023-12-18 NOTE — Progress Notes (Addendum)
 Patient ID: Renee [name], female   DOB: Apr 13, 1975, 48 y.o.   MRN: [id number]  SW met with pt in room to share about challenges with obtaining a HHA. SW discussed options include arranging for outpatient and SW can try next week for HHA since it is a new month. She opted for outpatient. She will discuss with boyfriend about meeting at her home. SW will follow-up about if he intends to come to the hospital.  SW spoke with Roselyn/[facility] [phone]) to discuss referral. Reports unable to accept due to staffing; limited in [place] (2 weeks out).   SW spoke with Mary/[facility] Intake [phone]) to discuss referral. Reports will explore and will follow-up.  *SW received message reporting unable to accept due to staffing.   *SW later met with pt to follow-up about boyfriend is coming . She reports the plan will come to the hospital and they will stop to another home. SW shared transportation will only take her to her home. She intends to inform boyfriend to meet her at her home.   Referral will be sent to [facility]- Outpatient PT/OT.   1107- SW spoke with Kiara/[facility] [phone]) to schedule transportation to home . Reference [id number].  SW updated pt on above. Pt provided handicap placard.   Declined HHAs Lynette/[facility] due to staffing Amy/[facility] Angie/[facility]- no [facility] contracts Kelly/[facility]- staffing   Graeme [name], MSW, LCSW Office: [phone] Cell: [phone] Fax: [phone]

## 2023-12-20 ENCOUNTER — Encounter: Admitting: Family

## 2023-12-24 NOTE — Progress Notes (Signed)
   This encounter was created in error - please disregard. No show

## 2023-12-25 ENCOUNTER — Other Ambulatory Visit (HOSPITAL_COMMUNITY): Payer: Self-pay

## 2023-12-26 ENCOUNTER — Telehealth: Payer: Self-pay

## 2023-12-26 NOTE — Telephone Encounter (Signed)
 Patient called for an Oxycodone  refill. Patient has been advised to call the general surgeon of a refill. Per hospital discharge note: follow up as needed patient.

## 2023-12-28 ENCOUNTER — Other Ambulatory Visit: Payer: Self-pay

## 2023-12-28 ENCOUNTER — Encounter (HOSPITAL_COMMUNITY): Payer: Self-pay

## 2023-12-28 ENCOUNTER — Emergency Department (HOSPITAL_COMMUNITY)
Admission: EM | Admit: 2023-12-28 | Discharge: 2023-12-28 | Disposition: A | Discharge status: Home / Self Care | Attending: Emergency Medicine | Admitting: Emergency Medicine

## 2023-12-28 ENCOUNTER — Emergency Department (HOSPITAL_COMMUNITY)

## 2023-12-28 DIAGNOSIS — Y732 Prosthetic and other implants, materials and accessory gastroenterology and urology devices associated with adverse incidents: Secondary | ICD-10-CM | POA: Insufficient documentation

## 2023-12-28 DIAGNOSIS — Z91040 Latex allergy status: Secondary | ICD-10-CM | POA: Insufficient documentation

## 2023-12-28 DIAGNOSIS — K529 Noninfective gastroenteritis and colitis, unspecified: Secondary | ICD-10-CM | POA: Insufficient documentation

## 2023-12-28 DIAGNOSIS — T85528A Displacement of other gastrointestinal prosthetic devices, implants and grafts, initial encounter: Secondary | ICD-10-CM | POA: Insufficient documentation

## 2023-12-28 LAB — CBC WITH DIFFERENTIAL/PLATELET
Abs Immature Granulocytes: 0.02 K/uL (ref 0.00–0.07)
Basophils Absolute: 0.2 K/uL — ABNORMAL HIGH (ref 0.0–0.1)
Basophils Relative: 2 %
Eosinophils Absolute: 0.3 K/uL (ref 0.0–0.5)
Eosinophils Relative: 4 %
HCT: 30.9 % — ABNORMAL LOW (ref 36.0–46.0)
Hemoglobin: 9.5 g/dL — ABNORMAL LOW (ref 12.0–15.0)
Immature Granulocytes: 0 %
Lymphocytes Relative: 35 %
Lymphs Abs: 2.8 K/uL (ref 0.7–4.0)
MCH: 31.3 pg (ref 26.0–34.0)
MCHC: 30.7 g/dL (ref 30.0–36.0)
MCV: 101.6 fL — ABNORMAL HIGH (ref 80.0–100.0)
Monocytes Absolute: 0.5 K/uL (ref 0.1–1.0)
Monocytes Relative: 6 %
Neutro Abs: 4.3 K/uL (ref 1.7–7.7)
Neutrophils Relative %: 53 %
Platelets: 301 K/uL (ref 150–400)
RBC: 3.04 MIL/uL — ABNORMAL LOW (ref 3.87–5.11)
RDW: 16.5 % — ABNORMAL HIGH (ref 11.5–15.5)
Smear Review: NORMAL
WBC: 8.1 K/uL (ref 4.0–10.5)
nRBC: 0 % (ref 0.0–0.2)

## 2023-12-28 LAB — COMPREHENSIVE METABOLIC PANEL WITH GFR
ALT: 12 U/L (ref 0–44)
AST: 28 U/L (ref 15–41)
Albumin: 3.4 g/dL — ABNORMAL LOW (ref 3.5–5.0)
Alkaline Phosphatase: 202 U/L — ABNORMAL HIGH (ref 38–126)
Anion gap: 10 (ref 5–15)
BUN: 14 mg/dL (ref 6–20)
CO2: 21 mmol/L — ABNORMAL LOW (ref 22–32)
Calcium: 8.3 mg/dL — ABNORMAL LOW (ref 8.9–10.3)
Chloride: 108 mmol/L (ref 98–111)
Creatinine, Ser: 0.51 mg/dL (ref 0.44–1.00)
GFR, Estimated: >60 mL/min (ref >60)
Glucose, Bld: 79 mg/dL (ref 70–99)
Potassium: 3.8 mmol/L (ref 3.5–5.1)
Sodium: 139 mmol/L (ref 135–145)
Total Bilirubin: 0.4 mg/dL (ref 0.0–1.2)
Total Protein: 6.7 g/dL (ref 6.5–8.1)

## 2023-12-28 MED ORDER — CIPROFLOXACIN HCL 500 MG PO TABS: 500.0000 mg | ORAL_TABLET | Freq: Two times a day (BID) | ORAL | 0 refills | Status: DC

## 2023-12-28 MED ORDER — IOHEXOL 300 MG/ML  SOLN
100.0000 mL | Freq: Once | INTRAMUSCULAR | Status: AC | PRN
  Administered 2023-12-28: 100 mL via INTRAVENOUS

## 2023-12-28 MED ORDER — METRONIDAZOLE 500 MG PO TABS: 500.0000 mg | ORAL_TABLET | Freq: Three times a day (TID) | ORAL | 0 refills | Status: AC

## 2023-12-28 MED ORDER — OXYCODONE-ACETAMINOPHEN 5-325 MG PO TABS: 1.0000 | ORAL_TABLET | Freq: Four times a day (QID) | ORAL | 0 refills | Status: DC | PRN

## 2023-12-28 MED ORDER — FENTANYL CITRATE (PF) 50 MCG/ML IJ SOSY
25.0000 ug | PREFILLED_SYRINGE | Freq: Once | INTRAMUSCULAR | Status: AC
  Administered 2023-12-28: 25 ug via INTRAVENOUS
  Filled 2023-12-28: qty 1

## 2023-12-28 NOTE — ED Provider Notes (Signed)
 [place] EMERGENCY DEPARTMENT AT [facility] Provider Note   CSN: [id number] Arrival date & time: 12/28/23  1623     Patient presents with: g tube problem   Renee [name] is a 48 y.o. female.  Patient with past history significant for alcohol  dependence in remission, fibromyalgia, just doing tube placement, presents the emergency department with concerns of J-tube concerns.  Reportedly had actually removed the J-tube when she was on the toilet.  States that she called to her general surgery office and was advised to come in due to concerns for pain in this area.  Denies any fever, chills, or bodyaches.  No reported nausea, vomiting, or diarrhea.  HPI     Prior to Admission medications   Medication Sig Start Date End Date Taking? Authorizing Provider  ciprofloxacin (CIPRO) 500 MG tablet Take 1 tablet (500 mg total) by mouth every 12 (twelve) hours. 12/28/23  Yes Shirle Provencal A, PA-C  metroNIDAZOLE  (FLAGYL ) 500 MG tablet Take 1 tablet (500 mg total) by mouth 3 (three) times daily for 5 days. 12/28/23 01/02/24 Yes Odessie Polzin A, PA-C  oxyCODONE -acetaminophen  (PERCOCET/ROXICET) 5-325 MG tablet Take 1 tablet by mouth every 6 (six) hours as needed for severe pain (pain score 7-10). 12/28/23  Yes Ginnette Gates A, PA-C  acetaminophen  (TYLENOL ) 325 MG tablet Take 2 tablets (650 mg total) by mouth every 6 (six) hours as needed for mild pain (pain score 1-3). 12/14/23   Angiulli, Toribio PARAS, PA-C  amphetamine -dextroamphetamine  (ADDERALL) 30 MG tablet Take 1 tablet by mouth 2 (two) times daily.    [provider]  ascorbic acid  (VITAMIN C ) 500 MG tablet Take 1 tablet (500 mg total) by mouth daily. 12/18/23 01/17/24  Angiulli, Toribio PARAS, PA-C  calcium  citrate (CALCITRATE - DOSED IN MG ELEMENTAL CALCIUM ) 950 (200 Ca) MG tablet Take 1 tablet (950 mg total) by mouth 3 (three) times daily with meals. 12/18/23 01/17/24  Angiulli, Toribio PARAS, PA-C  copper  tablet Take 1 tablet (2 mg total) by  mouth 3 (three) times daily for 3 days, THEN 2 tablets (4 mg total) daily for 7 days, THEN 1 tablet (2 mg total) daily for 7 days. 12/18/23 01/04/24  Angiulli, Toribio PARAS, PA-C  fluticasone  (FLONASE ) 50 MCG/ACT nasal spray Place 2 sprays into both nostrils daily as needed for allergies. 12/18/23   Angiulli, Toribio PARAS, PA-C  folic acid  (FOLVITE ) 1 MG tablet Take 1 tablet (1 mg total) by mouth daily. 12/18/23   Angiulli, Toribio PARAS, PA-C  hydrOXYzine  (ATARAX ) 25 MG tablet Take 1 tablet (25 mg total) by mouth every 6 (six) hours as needed for anxiety. 12/18/23   Angiulli, Toribio PARAS, PA-C  lactobacillus (FLORANEX/LACTINEX) PACK Take 1 packet (1 g total) by mouth 3 (three) times daily with meals. 12/18/23 01/17/24  Angiulli, Toribio PARAS, PA-C  levothyroxine  (SYNTHROID ) 200 MCG tablet Take 1 tablet (200 mcg total) by mouth daily at 6 (six) AM. 12/18/23   Angiulli, Toribio PARAS, PA-C  magnesium  oxide (MAG-OX) 400 (240 Mg) MG tablet Take 1 tablet (400 mg total) by mouth 2 (two) times daily. 12/18/23   Angiulli, Toribio PARAS, PA-C  melatonin 3 MG TABS tablet Take 1 tablet (3 mg total) by mouth at bedtime. 12/18/23 01/17/24  Angiulli, Toribio PARAS, PA-C  montelukast  (SINGULAIR ) 10 MG tablet Take 1 tablet (10 mg total) by mouth at bedtime. 12/18/23   Angiulli, Toribio PARAS, PA-C  Multiple Vitamin (MULTI-VITAMIN) tablet Take by mouth.    [provider]  omeprazole  (PRILOSEC) 40  MG capsule Take 1 capsule (40 mg total) by mouth 2 (two) times daily before a meal. 12/18/23 01/17/24  Angiulli, Toribio PARAS, PA-C  Oxycodone  HCl 10 MG TABS Take 1 tablet (10 mg total) by mouth every 4 (four) hours as needed for moderate pain (pain score 4-6) or severe pain (pain score 7-10) (1 for moderate pain; 2 for severe pain  hold for SBP < 90). 12/18/23   Angiulli, Toribio PARAS, PA-C  PARoxetine  (PAXIL ) 10 MG tablet Take 1 tablet (10 mg total) by mouth daily. 12/18/23   Angiulli, Toribio PARAS, PA-C  selenium  50 MCG TABS tablet Take 2 tablets (100 mcg total) by  mouth daily. 12/18/23 01/17/24  Angiulli, Toribio PARAS, PA-C  thiamine  (VITAMIN B1) 100 MG tablet Take 1 tablet (100 mg total) by mouth daily. 12/18/23   Angiulli, Toribio PARAS, PA-C  traZODone  (DESYREL ) 150 MG tablet Take 0.5 tablets (75 mg total) by mouth at bedtime. 12/18/23 01/17/24  Angiulli, Toribio PARAS, PA-C  vitamin A  3 MG (10000 UNITS) capsule Take 5 capsules (50,000 Units total) by mouth daily for 10 days, THEN 1 capsule (10,000 Units total) daily for 14 days. 12/18/23 01/11/24  Angiulli, Toribio PARAS, PA-C  Vitamin D , Ergocalciferol , (DRISDOL ) 1.25 MG (50000 UNIT) CAPS capsule Take 1 capsule (50,000 Units total) by mouth every 7 (seven) days. 12/18/23   Angiulli, Toribio PARAS, PA-C  vitamin E  180 MG (400 UNITS) capsule Take 1 capsule (400 Units total) by mouth daily. 12/18/23 01/17/24  Angiulli, Toribio PARAS, PA-C  Zinc  Sulfate 220 (50 Zn) MG TABS Take 1 tablet (220 mg total) by mouth daily. 12/18/23   Angiulli, Toribio PARAS, PA-C    Allergies: Morphine and codeine, Penicillins, Diphenhydramine, Latex, Synthroid  [levothyroxine ], and Allopurinol    Review of Systems  Gastrointestinal:  Positive for abdominal pain.  All other systems reviewed and are negative.   Updated Vital Signs BP 122/89 (BP Location: Left Arm)   Pulse 72   Temp 98.2 F (36.8 C) (Oral)   Resp 18   Ht 5' 7 (1.702 m)   Wt 67.9 kg   LMP 03/02/2021 Comment: perimenopause  SpO2 100%   BMI 23.45 kg/m   Physical Exam Vitals and nursing note reviewed.  Constitutional:      General: She is not in acute distress.    Appearance: She is well-developed.  HENT:     Head: Normocephalic and atraumatic.  Eyes:     Conjunctiva/sclera: Conjunctivae normal.  Cardiovascular:     Rate and Rhythm: Normal rate and regular rhythm.     Heart sounds: No murmur heard. Pulmonary:     Effort: Pulmonary effort is normal. No respiratory distress.     Breath sounds: Normal breath sounds.  Abdominal:     General: Abdomen is flat. Bowel sounds are  normal.     Palpations: Abdomen is soft.     Tenderness: There is abdominal tenderness.      Comments: TTP along the LLQ in area where J tube was inserted. No RUQ pain.  Musculoskeletal:        General: No swelling.     Cervical back: Neck supple.  Skin:    General: Skin is warm and dry.     Capillary Refill: Capillary refill takes less than 2 seconds.  Neurological:     Mental Status: She is alert.  Psychiatric:        Mood and Affect: Mood normal.     (all labs ordered are listed, but only abnormal results are  displayed) Labs Reviewed  CBC WITH DIFFERENTIAL/PLATELET - Abnormal; Notable for the following components:      Result Value   RBC 3.04 (*)    Hemoglobin 9.5 (*)    HCT 30.9 (*)    MCV 101.6 (*)    RDW 16.5 (*)    Basophils Absolute 0.2 (*)    All other components within normal limits  COMPREHENSIVE METABOLIC PANEL WITH GFR - Abnormal; Notable for the following components:   CO2 21 (*)    Calcium  8.3 (*)    Albumin  3.4 (*)    Alkaline Phosphatase 202 (*)    All other components within normal limits    EKG: None  Radiology: CT ABDOMEN PELVIS W CONTRAST Result Date: 12/28/2023 CLINICAL DATA:  Left lower quadrant pain EXAM: CT ABDOMEN AND PELVIS WITH CONTRAST TECHNIQUE: Multidetector CT imaging of the abdomen and pelvis was performed using the standard protocol following bolus administration of intravenous contrast. RADIATION DOSE REDUCTION: This exam was performed according to the departmental dose-optimization program which includes automated exposure control, adjustment of the mA and/or kV according to patient size and/or use of iterative reconstruction technique. CONTRAST:  OMNIPAQUE  IOHEXOL  300 MG/ML  SOLN COMPARISON:  CT abdomen and pelvis 11/26/2023. FINDINGS: Lower chest: No acute abnormality. Hepatobiliary: Gallbladder is dilated, but otherwise within normal limits. There is no biliary ductal dilatation. No focal liver lesions are identified. Pancreas:  Unremarkable. No pancreatic ductal dilatation or surrounding inflammatory changes. Spleen: Normal in size without focal abnormality. Adrenals/Urinary Tract: Adrenal glands are unremarkable. Kidneys are normal, without renal calculi, focal lesion, or hydronephrosis. Bladder is unremarkable. Stomach/Bowel: There is no bowel obstruction, pneumatosis or free air. There is rectosigmoid colon wall thickening with mild surrounding inflammatory stranding. The appendix is within normal limits. There is a small bowel anastomosis and left-sided ostomy present. There are postsurgical changes in the stomach. Vascular/Lymphatic: No significant vascular findings are present. No enlarged abdominal or pelvic lymph nodes. Reproductive: Uterus and bilateral adnexa are unremarkable. Other: There is a small amount of free fluid in the pelvis. There is diffuse body wall edema. There is no abdominal wall hernia. Musculoskeletal: Degenerative changes affect the spine. Mild chronic compression deformity of L1 appears unchanged. IMPRESSION: 1. Rectosigmoid colon wall thickening with surrounding inflammatory stranding compatible with nonspecific colitis. 2. Small amount of free fluid in the pelvis. 3. Diffuse body wall edema. 4. New left-sided ostomy. 5. Dilated gallbladder.  Please correlate clinically. Electronically Signed   By: Greig Pique M.D.   On: 12/28/2023 21:25     Procedures   Medications Ordered in the ED  fentaNYL  (SUBLIMAZE ) injection 25 mcg (25 mcg Intravenous Given 12/28/23 1815)  iohexol  (OMNIPAQUE ) 300 MG/ML solution 100 mL (100 mLs Intravenous Contrast Given 12/28/23 2055)                                    Medical Decision Making Amount and/or Complexity of Data Reviewed Labs: ordered. Radiology: ordered.  Risk Prescription drug management.   This patient presents to the ED for concern of J-tube concerns.  Differential diagnosis includes J-tube removal, abdominal pain, bowel  perforation    Additional history obtained:  Additional history obtained from chart review   Lab Tests:  I Ordered, and personally interpreted labs.  The pertinent results include: CBC unremarkable and at baseline with improving hemoglobin at 9.5, CMP unremarkable with no elevated bilirubin and lipase and elevation of the alkaline  phosphatase   Imaging Studies ordered:  I ordered imaging studies including CT abdomen pelvis I independently visualized and interpreted imaging which showed Rectosigmoid colon wall thickening with surrounding inflammatory stranding compatible with nonspecific colitis. 2. Small amount of free fluid in the pelvis. 3. Diffuse body wall edema. 4. New left-sided ostomy. 5. Dilated gallbladder.  Please correlate clinically. I agree with the radiologist interpretation   Medicines ordered and prescription drug management:  I ordered medication including fentanyl  for pain Reevaluation of the patient after these medicines showed that the patient improved I have reviewed the patients home medicines and have made adjustments as needed   Problem List / ED Course:  Patient with past history significant for J-tube placement, alcohol  dependence in remission, fibromyalgia presents to the emergency department with concerns of J-tube dislodgment.  This reportedly occurred about 1 hour prior to arriving.  Patient does not have any reported fevers and only endorses pain at this time in the site of the J-tube extrusion.  She reportedly does not use his tube for any specific use such as feedings or medications. Given area of dislodgment, consult to general surgery was made.  The site itself appears reassuring with no significant drainage, swelling, but there is tenderness over this area. Spoke with Dr. Althia, general surgery, who after consultation with his colleagues, advised that she is not currently using this tube for any specific purpose.  The tubing does not need to be  replaced but there primary concern having her referred to the emergency department for evaluation was that she called in reporting significant pain in the area where the tubing was removed.  He advised CT imaging to rule out more concerning etiology of her symptoms.  Otherwise, she can follow-up with our office. Basic labs are unremarkable.  CT on pelvis shows rectosigmoid colon wall thickening with some inflammatory stranding compatible with nonspecific colitis.  There is a noted new left-sided ostomy at the area that the tubing was removed.  He also has a noted dilated gallbladder.  Patient does not have any focal right upper quadrant tenderness.  With her current results, I have decided to start patient on a course of antibiotics given her delicate and complex surgical history.  Advised patient to follow-up closely with general surgery.  Short course of Percocet sent for further pain management.  She is otherwise stable for outpatient follow-up and discharged home.   Social Determinants of Health:  None  Final diagnoses:  Colitis  Dislodged jejunostomy tube    ED Discharge Orders          Ordered    oxyCODONE -acetaminophen  (PERCOCET/ROXICET) 5-325 MG tablet  Every 6 hours PRN        12/28/23 2143    metroNIDAZOLE  (FLAGYL ) 500 MG tablet  3 times daily        12/28/23 2143    ciprofloxacin  (CIPRO ) 500 MG tablet  Every 12 hours        12/28/23 2143               Abraham Margulies A, PA-C 12/28/23 2202    Tegeler, Lonni PARAS, MD 12/29/23 0003

## 2023-12-28 NOTE — ED Notes (Signed)
 First poc with patient. Pt axox4. GCS 15. PT just returned from imaging. Requesting meal.  PT notified that must remain NPO until results available from scan.  PT verbalized understanding. No respiratory distress noted.

## 2023-12-28 NOTE — Discharge Instructions (Addendum)
 You were seen in the emergency department for concerns regarding your jejunostomy tube.  After talking with our general surgeon, it does not appear that you need this tube in place.  You should keep this area covered as there will continue be some drainage but the site will slowly heal on its own.  Your CT today did show some concerns for colitis in the rectosigmoid.  This does not appear to be complicated however given your complex medical history, I have started on a short course of antibiotics to take for the next 5 days.  Ensure that you are staying hydrated, eating fiber, and avoiding significant straining with bowel movements.  For any concerns or worsening symptoms, return to the emergency department.  Otherwise, please follow-up with Dr. Althia with general surgery for further concerns.

## 2023-12-28 NOTE — ED Notes (Signed)
 PT axox4. GCS 15. PT verbalizes understanding of discharge instructions, follow up and new Rx.  PT provided food and wound care supplies per request.  PT escorted via wheelchair by primary RN and NT to transportation with Arley, significant other, whom patient called herself

## 2023-12-28 NOTE — ED Triage Notes (Signed)
 Patient BIBA, coming from home after accidentally removing g tube. Patient is alert and oriented x 4. Airway patent, respirations even and unlabored. Skin normal, warm and dry.

## 2024-02-17 ENCOUNTER — Inpatient Hospital Stay (HOSPITAL_COMMUNITY)
Admission: EM | Admit: 2024-02-17 | Discharge: 2024-02-20 | DRG: 871 | Disposition: A | Attending: Internal Medicine | Admitting: Internal Medicine

## 2024-02-17 ENCOUNTER — Emergency Department (HOSPITAL_COMMUNITY)

## 2024-02-17 ENCOUNTER — Encounter (HOSPITAL_COMMUNITY): Payer: Self-pay | Admitting: *Deleted

## 2024-02-17 ENCOUNTER — Other Ambulatory Visit: Payer: Self-pay

## 2024-02-17 DIAGNOSIS — Z87891 Personal history of nicotine dependence: Secondary | ICD-10-CM | POA: Diagnosis not present

## 2024-02-17 DIAGNOSIS — E876 Hypokalemia: Secondary | ICD-10-CM | POA: Diagnosis present

## 2024-02-17 DIAGNOSIS — E039 Hypothyroidism, unspecified: Secondary | ICD-10-CM | POA: Diagnosis present

## 2024-02-17 DIAGNOSIS — Z833 Family history of diabetes mellitus: Secondary | ICD-10-CM

## 2024-02-17 DIAGNOSIS — G9340 Encephalopathy, unspecified: Secondary | ICD-10-CM | POA: Diagnosis not present

## 2024-02-17 DIAGNOSIS — K812 Acute cholecystitis with chronic cholecystitis: Secondary | ICD-10-CM | POA: Diagnosis present

## 2024-02-17 DIAGNOSIS — Z79890 Hormone replacement therapy (postmenopausal): Secondary | ICD-10-CM

## 2024-02-17 DIAGNOSIS — R6521 Severe sepsis with septic shock: Secondary | ICD-10-CM | POA: Diagnosis present

## 2024-02-17 DIAGNOSIS — K760 Fatty (change of) liver, not elsewhere classified: Secondary | ICD-10-CM | POA: Diagnosis present

## 2024-02-17 DIAGNOSIS — K819 Cholecystitis, unspecified: Secondary | ICD-10-CM

## 2024-02-17 DIAGNOSIS — E46 Unspecified protein-calorie malnutrition: Secondary | ICD-10-CM | POA: Diagnosis not present

## 2024-02-17 DIAGNOSIS — R680 Hypothermia, not associated with low environmental temperature: Secondary | ICD-10-CM | POA: Diagnosis present

## 2024-02-17 DIAGNOSIS — E8729 Other acidosis: Secondary | ICD-10-CM | POA: Diagnosis not present

## 2024-02-17 DIAGNOSIS — K567 Ileus, unspecified: Secondary | ICD-10-CM | POA: Diagnosis present

## 2024-02-17 DIAGNOSIS — F4310 Post-traumatic stress disorder, unspecified: Secondary | ICD-10-CM | POA: Diagnosis present

## 2024-02-17 DIAGNOSIS — Z9889 Other specified postprocedural states: Secondary | ICD-10-CM

## 2024-02-17 DIAGNOSIS — G4733 Obstructive sleep apnea (adult) (pediatric): Secondary | ICD-10-CM | POA: Diagnosis present

## 2024-02-17 DIAGNOSIS — I10 Essential (primary) hypertension: Secondary | ICD-10-CM | POA: Diagnosis present

## 2024-02-17 DIAGNOSIS — Z9884 Bariatric surgery status: Secondary | ICD-10-CM

## 2024-02-17 DIAGNOSIS — Z79899 Other long term (current) drug therapy: Secondary | ICD-10-CM

## 2024-02-17 DIAGNOSIS — G9341 Metabolic encephalopathy: Secondary | ICD-10-CM | POA: Diagnosis present

## 2024-02-17 DIAGNOSIS — Z934 Other artificial openings of gastrointestinal tract status: Secondary | ICD-10-CM

## 2024-02-17 DIAGNOSIS — R7989 Other specified abnormal findings of blood chemistry: Secondary | ICD-10-CM | POA: Diagnosis present

## 2024-02-17 DIAGNOSIS — A419 Sepsis, unspecified organism: Secondary | ICD-10-CM | POA: Diagnosis not present

## 2024-02-17 DIAGNOSIS — F319 Bipolar disorder, unspecified: Secondary | ICD-10-CM | POA: Diagnosis present

## 2024-02-17 DIAGNOSIS — K7200 Acute and subacute hepatic failure without coma: Secondary | ICD-10-CM | POA: Diagnosis present

## 2024-02-17 DIAGNOSIS — D696 Thrombocytopenia, unspecified: Secondary | ICD-10-CM | POA: Diagnosis not present

## 2024-02-17 DIAGNOSIS — G8929 Other chronic pain: Secondary | ICD-10-CM | POA: Diagnosis present

## 2024-02-17 DIAGNOSIS — Z8249 Family history of ischemic heart disease and other diseases of the circulatory system: Secondary | ICD-10-CM

## 2024-02-17 DIAGNOSIS — Z8619 Personal history of other infectious and parasitic diseases: Secondary | ICD-10-CM

## 2024-02-17 DIAGNOSIS — F909 Attention-deficit hyperactivity disorder, unspecified type: Secondary | ICD-10-CM | POA: Diagnosis present

## 2024-02-17 DIAGNOSIS — R5381 Other malaise: Secondary | ICD-10-CM | POA: Diagnosis present

## 2024-02-17 DIAGNOSIS — Z9119 Patient's noncompliance with other medical treatment and regimen due to financial hardship: Secondary | ICD-10-CM

## 2024-02-17 DIAGNOSIS — E8721 Acute metabolic acidosis: Secondary | ICD-10-CM | POA: Diagnosis not present

## 2024-02-17 DIAGNOSIS — R6520 Severe sepsis without septic shock: Secondary | ICD-10-CM | POA: Diagnosis not present

## 2024-02-17 DIAGNOSIS — M797 Fibromyalgia: Secondary | ICD-10-CM | POA: Diagnosis present

## 2024-02-17 DIAGNOSIS — Z87440 Personal history of urinary (tract) infections: Secondary | ICD-10-CM

## 2024-02-17 DIAGNOSIS — A4151 Sepsis due to Escherichia coli [E. coli]: Secondary | ICD-10-CM | POA: Diagnosis present

## 2024-02-17 DIAGNOSIS — Z9049 Acquired absence of other specified parts of digestive tract: Secondary | ICD-10-CM | POA: Diagnosis not present

## 2024-02-17 DIAGNOSIS — R079 Chest pain, unspecified: Secondary | ICD-10-CM | POA: Diagnosis not present

## 2024-02-17 DIAGNOSIS — R7401 Elevation of levels of liver transaminase levels: Secondary | ICD-10-CM | POA: Diagnosis not present

## 2024-02-17 DIAGNOSIS — T7840XD Allergy, unspecified, subsequent encounter: Secondary | ICD-10-CM

## 2024-02-17 DIAGNOSIS — Z880 Allergy status to penicillin: Secondary | ICD-10-CM

## 2024-02-17 DIAGNOSIS — Z885 Allergy status to narcotic agent status: Secondary | ICD-10-CM

## 2024-02-17 DIAGNOSIS — F411 Generalized anxiety disorder: Secondary | ICD-10-CM | POA: Diagnosis present

## 2024-02-17 DIAGNOSIS — E872 Acidosis, unspecified: Secondary | ICD-10-CM | POA: Diagnosis present

## 2024-02-17 DIAGNOSIS — K219 Gastro-esophageal reflux disease without esophagitis: Secondary | ICD-10-CM | POA: Diagnosis present

## 2024-02-17 DIAGNOSIS — E861 Hypovolemia: Secondary | ICD-10-CM | POA: Diagnosis present

## 2024-02-17 DIAGNOSIS — D61818 Other pancytopenia: Secondary | ICD-10-CM | POA: Diagnosis present

## 2024-02-17 DIAGNOSIS — Z818 Family history of other mental and behavioral disorders: Secondary | ICD-10-CM

## 2024-02-17 DIAGNOSIS — R0789 Other chest pain: Secondary | ICD-10-CM | POA: Diagnosis present

## 2024-02-17 DIAGNOSIS — Z888 Allergy status to other drugs, medicaments and biological substances status: Secondary | ICD-10-CM | POA: Diagnosis not present

## 2024-02-17 LAB — BLOOD CULTURE ID PANEL (REFLEXED) - BCID2

## 2024-02-17 LAB — BASIC METABOLIC PANEL WITH GFR
Anion gap: 14 (ref 5–15)
BUN: 12 mg/dL (ref 6–20)
CO2: 15 mmol/L — ABNORMAL LOW (ref 22–32)
Calcium: 7.4 mg/dL — ABNORMAL LOW (ref 8.9–10.3)
Chloride: 111 mmol/L (ref 98–111)
Creatinine, Ser: 0.61 mg/dL (ref 0.44–1.00)
GFR, Estimated: >60 mL/min
Glucose, Bld: 69 mg/dL — ABNORMAL LOW (ref 70–99)
Potassium: 2.9 mmol/L — ABNORMAL LOW (ref 3.5–5.1)
Sodium: 140 mmol/L (ref 135–145)

## 2024-02-17 LAB — CBC WITH DIFFERENTIAL/PLATELET
Abs Immature Granulocytes: 0.03 10*3/uL (ref 0.00–0.07)
Basophils Absolute: 0.1 10*3/uL (ref 0.0–0.1)
Basophils Relative: 1 %
Eosinophils Absolute: 0.1 10*3/uL (ref 0.0–0.5)
Eosinophils Relative: 1 %
HCT: 34.4 % — ABNORMAL LOW (ref 36.0–46.0)
Hemoglobin: 11.1 g/dL — ABNORMAL LOW (ref 12.0–15.0)
Immature Granulocytes: 0 %
Lymphocytes Relative: 45 %
Lymphs Abs: 3.7 10*3/uL (ref 0.7–4.0)
MCH: 30.7 pg (ref 26.0–34.0)
MCHC: 32.3 g/dL (ref 30.0–36.0)
MCV: 95 fL (ref 80.0–100.0)
Monocytes Absolute: 0.2 10*3/uL (ref 0.1–1.0)
Monocytes Relative: 2 %
Neutro Abs: 4.2 10*3/uL (ref 1.7–7.7)
Neutrophils Relative %: 51 %
Platelets: 178 10*3/uL (ref 150–400)
RBC: 3.62 MIL/uL — ABNORMAL LOW (ref 3.87–5.11)
RDW: 16.8 % — ABNORMAL HIGH (ref 11.5–15.5)
WBC: 8.2 10*3/uL (ref 4.0–10.5)
nRBC: 0 % (ref 0.0–0.2)

## 2024-02-17 LAB — BLOOD GAS, VENOUS
Acid-base deficit: 11.8 mmol/L — ABNORMAL HIGH (ref 0.0–2.0)
Bicarbonate: 17.6 mmol/L — ABNORMAL LOW (ref 20.0–28.0)
O2 Saturation: 32.2 %
Patient temperature: 37.5
pCO2, Ven: 54 mmHg (ref 44–60)
pH, Ven: 7.12 — CL (ref 7.25–7.43)
pO2, Ven: <31 mmHg — CL (ref 32–45)

## 2024-02-17 LAB — COMPREHENSIVE METABOLIC PANEL WITH GFR
ALT: 78 U/L — ABNORMAL HIGH (ref 0–44)
AST: 388 U/L — ABNORMAL HIGH (ref 15–41)
Albumin: 3.3 g/dL — ABNORMAL LOW (ref 3.5–5.0)
Alkaline Phosphatase: 183 U/L — ABNORMAL HIGH (ref 38–126)
Anion gap: 13 (ref 5–15)
BUN: 12 mg/dL (ref 6–20)
CO2: 13 mmol/L — ABNORMAL LOW (ref 22–32)
Calcium: 7.3 mg/dL — ABNORMAL LOW (ref 8.9–10.3)
Chloride: 111 mmol/L (ref 98–111)
Creatinine, Ser: 0.63 mg/dL (ref 0.44–1.00)
GFR, Estimated: >60 mL/min
Glucose, Bld: 108 mg/dL — ABNORMAL HIGH (ref 70–99)
Potassium: 2.8 mmol/L — ABNORMAL LOW (ref 3.5–5.1)
Sodium: 137 mmol/L (ref 135–145)
Total Bilirubin: 0.9 mg/dL (ref 0.0–1.2)
Total Protein: 5.7 g/dL — ABNORMAL LOW (ref 6.5–8.1)

## 2024-02-17 LAB — URINALYSIS, ROUTINE W REFLEX MICROSCOPIC
Bilirubin Urine: NEGATIVE
Glucose, UA: NEGATIVE mg/dL
Hgb urine dipstick: NEGATIVE
Ketones, ur: NEGATIVE mg/dL
Leukocytes,Ua: NEGATIVE
Nitrite: NEGATIVE
Protein, ur: NEGATIVE mg/dL
Specific Gravity, Urine: 1.009 (ref 1.005–1.030)
pH: 6 (ref 5.0–8.0)

## 2024-02-17 LAB — I-STAT CG4 LACTIC ACID, ED
Lactic Acid, Venous: 2.3 mmol/L (ref 0.5–1.9)
Lactic Acid, Venous: 3.6 mmol/L (ref 0.5–1.9)
Lactic Acid, Venous: 5.2 mmol/L (ref 0.5–1.9)

## 2024-02-17 LAB — TROPONIN T, HIGH SENSITIVITY
Troponin T High Sensitivity: 27 ng/L — ABNORMAL HIGH (ref 0–19)
Troponin T High Sensitivity: 25 ng/L — ABNORMAL HIGH (ref 0–19)

## 2024-02-17 LAB — PRO BRAIN NATRIURETIC PEPTIDE: Pro Brain Natriuretic Peptide: 77 pg/mL

## 2024-02-17 LAB — CBC
HCT: 35.4 % — ABNORMAL LOW (ref 36.0–46.0)
Hemoglobin: 11.5 g/dL — ABNORMAL LOW (ref 12.0–15.0)
MCH: 30 pg (ref 26.0–34.0)
MCHC: 32.5 g/dL (ref 30.0–36.0)
MCV: 92.4 fL (ref 80.0–100.0)
Platelets: 136 10*3/uL — ABNORMAL LOW (ref 150–400)
RBC: 3.83 MIL/uL — ABNORMAL LOW (ref 3.87–5.11)
RDW: 16.9 % — ABNORMAL HIGH (ref 11.5–15.5)
WBC: 2 10*3/uL — ABNORMAL LOW (ref 4.0–10.5)
nRBC: 0 % (ref 0.0–0.2)

## 2024-02-17 LAB — GLUCOSE, CAPILLARY
Glucose-Capillary: 103 mg/dL — ABNORMAL HIGH (ref 70–99)
Glucose-Capillary: 132 mg/dL — ABNORMAL HIGH (ref 70–99)
Glucose-Capillary: 86 mg/dL (ref 70–99)

## 2024-02-17 LAB — LACTIC ACID, PLASMA: Lactic Acid, Venous: 6.8 mmol/L (ref 0.5–1.9)

## 2024-02-17 LAB — MAGNESIUM: Magnesium: 1.3 mg/dL — ABNORMAL LOW (ref 1.7–2.4)

## 2024-02-17 LAB — MRSA NEXT GEN BY PCR, NASAL: MRSA by PCR Next Gen: NOT DETECTED

## 2024-02-17 LAB — LIPASE, BLOOD: Lipase: 55 U/L — ABNORMAL HIGH (ref 11–51)

## 2024-02-17 LAB — TSH: TSH: 228 u[IU]/mL — ABNORMAL HIGH (ref 0.350–4.500)

## 2024-02-17 MED ORDER — SODIUM CHLORIDE 0.9 % IV SOLN: 2.0000 g | Freq: Once | INTRAVENOUS | Status: DC

## 2024-02-17 MED ORDER — POTASSIUM CHLORIDE 10 MEQ/100ML IV SOLN: 10.0000 meq | INTRAVENOUS | Status: DC

## 2024-02-17 MED ORDER — DEXTROSE IN LACTATED RINGERS 5 % IV SOLN: INTRAVENOUS | Status: DC

## 2024-02-17 MED ORDER — SODIUM CHLORIDE 0.9 % IV SOLN
2.0000 g | Freq: Once | INTRAVENOUS | Status: AC
  Administered 2024-02-17: 2 g via INTRAVENOUS
  Filled 2024-02-17: qty 12.5

## 2024-02-17 MED ORDER — POTASSIUM CHLORIDE 10 MEQ/100ML IV SOLN
10.0000 meq | INTRAVENOUS | Status: AC
  Administered 2024-02-17 (×2): 10 meq via INTRAVENOUS
  Filled 2024-02-17 (×2): qty 100

## 2024-02-17 MED ORDER — POTASSIUM CHLORIDE CRYS ER 20 MEQ PO TBCR
40.0000 meq | EXTENDED_RELEASE_TABLET | Freq: Once | ORAL | Status: AC
  Administered 2024-02-17: 40 meq via ORAL
  Filled 2024-02-17: qty 2

## 2024-02-17 MED ORDER — THIAMINE HCL 100 MG/ML IJ SOLN
200.0000 mg | Freq: Every day | INTRAVENOUS | Status: AC
  Administered 2024-02-17 – 2024-02-19 (×3): 200 mg via INTRAVENOUS
  Filled 2024-02-17 (×4): qty 2

## 2024-02-17 MED ORDER — SODIUM CHLORIDE 0.9 % IV SOLN
2.0000 g | Freq: Three times a day (TID) | INTRAVENOUS | Status: DC
  Administered 2024-02-17 – 2024-02-18 (×3): 2 g via INTRAVENOUS
  Filled 2024-02-17 (×4): qty 12.5

## 2024-02-17 MED ORDER — SODIUM CHLORIDE 0.9 % IV SOLN
250.0000 mL | INTRAVENOUS | Status: AC
  Administered 2024-02-17: 250 mL via INTRAVENOUS

## 2024-02-17 MED ORDER — HYDROCORTISONE SOD SUC (PF) 100 MG IJ SOLR
100.0000 mg | Freq: Once | INTRAMUSCULAR | Status: AC
  Administered 2024-02-17: 100 mg via INTRAVENOUS
  Filled 2024-02-17: qty 2

## 2024-02-17 MED ORDER — POLYETHYLENE GLYCOL 3350 17 G PO PACK: 17.0000 g | PACK | Freq: Every day | ORAL | Status: DC | PRN

## 2024-02-17 MED ORDER — ORAL CARE MOUTH RINSE: 15.0000 mL | OROMUCOSAL | Status: DC | PRN

## 2024-02-17 MED ORDER — LACTATED RINGERS IV BOLUS
1000.0000 mL | Freq: Once | INTRAVENOUS | Status: AC
  Administered 2024-02-17: 1000 mL via INTRAVENOUS

## 2024-02-17 MED ORDER — LACTATED RINGERS IV BOLUS (SEPSIS)
1000.0000 mL | Freq: Once | INTRAVENOUS | Status: AC
  Administered 2024-02-17: 1000 mL via INTRAVENOUS

## 2024-02-17 MED ORDER — IOHEXOL 350 MG/ML SOLN
100.0000 mL | Freq: Once | INTRAVENOUS | Status: AC | PRN
  Administered 2024-02-17: 100 mL via INTRAVENOUS

## 2024-02-17 MED ORDER — POTASSIUM CHLORIDE 2 MEQ/ML IV SOLN
INTRAVENOUS | Status: DC
  Filled 2024-02-17: qty 1000

## 2024-02-17 MED ORDER — ENOXAPARIN SODIUM 40 MG/0.4ML IJ SOSY
40.0000 mg | PREFILLED_SYRINGE | INTRAMUSCULAR | Status: DC
  Administered 2024-02-17 – 2024-02-19 (×3): 40 mg via SUBCUTANEOUS
  Filled 2024-02-17 (×3): qty 0.4

## 2024-02-17 MED ORDER — CHLORHEXIDINE GLUCONATE CLOTH 2 % EX PADS
6.0000 | MEDICATED_PAD | Freq: Every day | CUTANEOUS | Status: DC
  Administered 2024-02-19 – 2024-02-20 (×2): 6 via TOPICAL

## 2024-02-17 MED ORDER — LACTATED RINGERS IV BOLUS (SEPSIS)
250.0000 mL | Freq: Once | INTRAVENOUS | Status: AC
  Administered 2024-02-17: 1000 mL via INTRAVENOUS

## 2024-02-17 MED ORDER — HYDROMORPHONE HCL 1 MG/ML IJ SOLN
0.5000 mg | Freq: Once | INTRAMUSCULAR | Status: AC
  Administered 2024-02-17: 0.5 mg via INTRAVENOUS
  Filled 2024-02-17: qty 1

## 2024-02-17 MED ORDER — LEVOTHYROXINE SODIUM 100 MCG/5ML IV SOLN
200.0000 ug | Freq: Once | INTRAVENOUS | Status: AC
  Administered 2024-02-17: 200 ug via INTRAVENOUS
  Filled 2024-02-17: qty 10

## 2024-02-17 MED ORDER — SENNA 8.6 MG PO TABS: 1.0000 | ORAL_TABLET | Freq: Two times a day (BID) | ORAL | Status: DC | PRN

## 2024-02-17 MED ORDER — SODIUM BICARBONATE 8.4 % IV SOLN
INTRAVENOUS | Status: DC
  Filled 2024-02-17 (×5): qty 1000

## 2024-02-17 MED ORDER — HYDROCORTISONE SOD SUC (PF) 100 MG IJ SOLR
50.0000 mg | Freq: Four times a day (QID) | INTRAMUSCULAR | Status: DC
  Filled 2024-02-17: qty 2

## 2024-02-17 MED ORDER — NOREPINEPHRINE 4 MG/250ML-% IV SOLN
0.0000 ug/min | INTRAVENOUS | Status: DC
  Administered 2024-02-17: 4 ug/min via INTRAVENOUS
  Filled 2024-02-17: qty 250

## 2024-02-17 MED ORDER — LACTATED RINGERS IV SOLN: INTRAVENOUS | Status: DC

## 2024-02-17 MED ORDER — METRONIDAZOLE 500 MG/100ML IV SOLN
500.0000 mg | Freq: Once | INTRAVENOUS | Status: AC
  Administered 2024-02-17: 500 mg via INTRAVENOUS
  Filled 2024-02-17: qty 100

## 2024-02-17 MED ORDER — MAGNESIUM SULFATE 2 GM/50ML IV SOLN
2.0000 g | Freq: Once | INTRAVENOUS | Status: AC
  Administered 2024-02-17: 2 g via INTRAVENOUS
  Filled 2024-02-17: qty 50

## 2024-02-17 MED ORDER — HYDROCORTISONE SOD SUC (PF) 100 MG IJ SOLR
50.0000 mg | Freq: Four times a day (QID) | INTRAMUSCULAR | Status: AC
  Administered 2024-02-17 – 2024-02-18 (×3): 50 mg via INTRAVENOUS
  Filled 2024-02-17 (×3): qty 2

## 2024-02-17 MED ORDER — LEVOTHYROXINE SODIUM 100 MCG/5ML IV SOLN
75.0000 ug | Freq: Every day | INTRAVENOUS | Status: DC
  Administered 2024-02-18: 75 ug via INTRAVENOUS
  Filled 2024-02-17 (×2): qty 5

## 2024-02-17 MED ORDER — METRONIDAZOLE 500 MG/100ML IV SOLN
500.0000 mg | Freq: Two times a day (BID) | INTRAVENOUS | Status: DC
  Administered 2024-02-17 – 2024-02-18 (×2): 500 mg via INTRAVENOUS
  Filled 2024-02-17 (×2): qty 100

## 2024-02-17 NOTE — ED Notes (Signed)
 Called and gave report to 2M5 RN.

## 2024-02-17 NOTE — Sepsis Progress Note (Signed)
 Elink monitoring for the code sepsis protocol.

## 2024-02-17 NOTE — ED Triage Notes (Signed)
 Patient presents to ed via [facility] from home with c/o chest pain with radiation to back osnet 1 hour PTA per ems patient was given Haldol 5 mg IM and Zofran  4 mg IM, upon arrival patient  moving all over the stretcher c/o back pain

## 2024-02-17 NOTE — Consult Note (Signed)
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  CT on 02/17/2024 FINDINGS: Gallbladder: Markedly dilated gallbladder measuring 13.0 x 6.1 cm. Echogenic sludge is seen in the gallbladder lumen, but no discrete gallstones are visualized. Diffuse gallbladder wall thickening is seen measuring up to 9 mm, but no sonographic Murphy sign noted by the sonographer, and no evidence of pericholecystic fluid. Common bile duct: Diameter: Mildly dilated measuring 10 mm in diameter. No definite intrahepatic ductal dilatation seen. Liver: Diffusely increased parenchymal echogenicity consistent with steatosis. No focal liver lesions identified. Small amount of perihepatic fluid seen. Portal vein is patent on color  Doppler imaging with normal direction of blood flow towards the liver. Other: None. IMPRESSION: Markedly dilated gallbladder, with echogenic sludge but no definite gallstones. Diffuse gallbladder wall thickening, but no sonographic Murphy sign or pericholecystic fluid. These findings are still suspicious for acute cholecystitis. Mild dilatation of common bile duct measuring 10 mm. Hepatic steatosis, and small amount of perihepatic fluid. Electronically Signed   By: Norleen [name] Kil M.D.   On: 02/17/2024 09:28   CT Angio Chest/Abd/Pel for Dissection W and/or W/WO Result Date: 02/17/2024 CLINICAL DATA:  Chest pain and hypotension. Clinical concern for aortic dissection. EXAM: CT ANGIOGRAPHY CHEST, ABDOMEN AND PELVIS TECHNIQUE: Non-contrast CT of the chest was initially obtained. Multidetector CT imaging through the chest, abdomen and pelvis was performed using the standard protocol during bolus administration of intravenous contrast. Multiplanar reconstructed images and MIPs were obtained and reviewed to evaluate the vascular anatomy. RADIATION DOSE REDUCTION: This exam was performed according to the departmental dose-optimization program which includes automated exposure control, adjustment of the mA and/or kV according to patient size and/or use of iterative reconstruction technique. CONTRAST:  OMNIPAQUE  IOHEXOL  350 MG/ML SOLN COMPARISON:  Abdomen pelvis CT 12/28/2023 FINDINGS: CTA CHEST FINDINGS Cardiovascular: Pre contrast imaging shows no hyperdense crescent in the wall of the thoracic aorta to suggest the presence of an acute intramural hematoma. Imaging after IV contrast administration shows no thoracic aortic aneurysm. There is no dissection of the thoracic aorta. Enlargement of the pulmonary outflow tract/main pulmonary arteries suggests pulmonary arterial hypertension. No CT evidence for acute pulmonary embolus Mediastinum/Nodes: No mediastinal lymphadenopathy. There is no hilar lymphadenopathy. The  esophagus has normal imaging features. There is no axillary lymphadenopathy. Lungs/Pleura: Dependent ground-glass opacity in both lungs is likely atelectatic. No dense focal airspace consolidation. No pulmonary edema or substantial pleural effusion. Musculoskeletal: Multiple old left rib fractures evident. Review of the MIP images confirms the above findings. CTA ABDOMEN AND PELVIS FINDINGS VASCULAR Aorta: Normal caliber aorta without aneurysm, dissection, vasculitis or significant stenosis. Celiac: Patent without evidence of aneurysm, dissection, vasculitis or significant stenosis. SMA: Patent without evidence of aneurysm, dissection, vasculitis or significant stenosis. Renals: Both renal arteries are patent without evidence of aneurysm, dissection, vasculitis, fibromuscular dysplasia or significant stenosis. IMA: Patent without evidence of aneurysm, dissection, vasculitis or significant stenosis. Inflow: Patent without evidence  of aneurysm, dissection, vasculitis or significant stenosis. Veins: No obvious venous abnormality within the limitations of this arterial phase study. Review of the MIP images confirms the above findings. NON-VASCULAR Hepatobiliary: Mildly heterogeneous liver perfusion on arterial phase imaging. Periportal edema evident. Gallbladder is massively dilated and progressive since prior measuring on the order of 16.6 x 7.0 cm today compared to 12.1 x 3.7 cm previously. No definite gallstones no perceptible gallbladder wall thickening or pericholecystic fluid. Common bile duct in the head of the pancreas measures up to 11 mm diameter increased from 7 mm previously. Pancreas: No focal mass lesion. No dilatation of the main duct. No intraparenchymal cyst. No peripancreatic edema. Spleen: No splenomegaly. No suspicious focal mass lesion. Adrenals/Urinary Tract: No adrenal nodule or mass. Kidneys unremarkable. No evidence for hydroureter. The urinary bladder appears normal for the degree of  distention. Stomach/Bowel: Surgical changes noted in the stomach. 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 Multiple small bowel anastomoses are identified in the abdomen pelvis. Neither the terminal ileum nor the appendix are discernible. Patient has a patulous, markedly elongated/redundant colon. Although there is diffuse generalized distention of small bowel and colon, appearance is not in a frankly obstructive pattern and the colon is diffusely patulous filled with gas and stool from the cecum to the rectum. No bowel wall thickening or pneumatosis. Mild perirectal edema noted on a background of diffuse body wall edema. Lymphatic: There is no gastrohepatic or hepatoduodenal ligament lymphadenopathy. No retroperitoneal or mesenteric lymphadenopathy. No pelvic sidewall lymphadenopathy. Reproductive: No adnexal mass. Other: No substantial intraperitoneal free fluid. Musculoskeletal: Diffuse body wall edema evident. No worrisome lytic or sclerotic osseous abnormality. Review of the MIP images confirms the above findings. IMPRESSION: 1. No evidence for thoracoabdominal aortic aneurysm or dissection. 2. Enlargement of the pulmonary outflow tract/main pulmonary arteries suggests pulmonary arterial hypertension. 3. Massively dilated gallbladder, progressive since prior. No definite gallstones or perceptible gallbladder wall thickening. Common bile duct in the head of the pancreas measures up to 11 mm diameter increased from 7 mm previously. Correlation with liver function test recommended. Right upper quadrant ultrasound may prove helpful to further evaluate. 4. Diffuse generalized distention of small bowel and colon, not in a frankly obstructive pattern. No bowel wall thickening or pneumatosis. Imaging features may be related to diffuse ileus. 5. Mild perirectal edema, nonspecific, on a background of diffuse body wall edema.  Electronically Signed   By: Camellia Candle M.D.   On: 02/17/2024 05:53    A/P: Renee [name] is an 49 y.o. female with bipolar disorder, HTN, hypothyroidism, GERD, HSV, polyarthralgia prior RYGB; exlap/internal hernia repair 11/26/23 with open abdomen, closed 11/4 now here with possible acute cholecystitis?  She endorses no abdominal pain whatsoever and has no tenderness on exam for that matter.  She does report back pain.  On imaging, her gallbladder was massively dilated.  No evident wall thickening.  CBD measures 11 mm. RUQ US  shows markedly dilated gallbladder with sludge but no apparent stones.  There is evident wall thickening on her ultrasound.  No sonographic Murphy's or Perry cholecystic fluid.  CBD 10 mm.  Hepatic steatosis and some perihepatic fluid was noted.  LFTs showed total bilirubin of 0.9.  Mild transaminitis.  Lipase 55. WBC 8.2  - Unclear if she actually has acute cholecystitis as clinically this is not appear to be recognized on examination or based on her history.  Would recommend obtaining HIDA scan for further evaluation.  In the interim, empiric IV antibiotics to cover cholecystitis. - We will follow with you.   I spent a total of 80 minutes in both face-to-face and non-face-to-face activities, excluding procedures performed, for this visit on the date of this encounter.   Lonni Pizza, MD [facility]     [1]  Allergies Allergen Reactions   Morphine And Codeine Hives and Rash   Penicillins Hives and Rash   Diphenhydramine     Bendrayl-Has opposite effect    Latex Dermatitis    Paper tape ok    Synthroid  [Levothyroxine ]     Name brand causes hair loss   Allopurinol Hives and Rash   "

## 2024-02-17 NOTE — Progress Notes (Signed)
 PHARMACY - PHYSICIAN COMMUNICATION CRITICAL VALUE ALERT - BLOOD CULTURE IDENTIFICATION (BCID)  Renee [name] is an 49 y.o. female who presented to [facility] on 02/17/2024 with a chief complaint of Septic Shock  Assessment:  1 of 3 bottles GNR E coli no resistance detected, possible intraabdominal source  Name of physician (or Provider) Contacted: Kamat  Current antibiotics: Cefepime /Flagyl   Changes to prescribed antibiotics recommended:  Patient is on recommended antibiotics - No changes needed, since E Coli may not be only microbe causing clinical picture, keeping broad spectrum intraabdominal coverage.  Results for orders placed or performed during the hospital encounter of 02/17/24  Blood Culture ID Panel (Reflexed) (Collected: 02/17/2024  5:30 AM)  Result Value Ref Range   Enterococcus faecalis NOT DETECTED NOT DETECTED   Enterococcus Faecium NOT DETECTED NOT DETECTED   Listeria monocytogenes NOT DETECTED NOT DETECTED   Staphylococcus species NOT DETECTED NOT DETECTED   Staphylococcus aureus (BCID) NOT DETECTED NOT DETECTED   Staphylococcus epidermidis NOT DETECTED NOT DETECTED   Staphylococcus lugdunensis NOT DETECTED NOT DETECTED   Streptococcus species NOT DETECTED NOT DETECTED   Streptococcus agalactiae NOT DETECTED NOT DETECTED   Streptococcus pneumoniae NOT DETECTED NOT DETECTED   Streptococcus pyogenes NOT DETECTED NOT DETECTED   A.calcoaceticus-baumannii NOT DETECTED NOT DETECTED   Bacteroides fragilis NOT DETECTED NOT DETECTED   Enterobacterales DETECTED (A) NOT DETECTED   Enterobacter cloacae complex NOT DETECTED NOT DETECTED   Escherichia coli DETECTED (A) NOT DETECTED   Klebsiella aerogenes NOT DETECTED NOT DETECTED   Klebsiella oxytoca NOT DETECTED NOT DETECTED   Klebsiella pneumoniae NOT DETECTED NOT DETECTED   Proteus species NOT DETECTED NOT DETECTED   Salmonella species NOT DETECTED NOT DETECTED   Serratia marcescens NOT DETECTED NOT DETECTED   Haemophilus  influenzae NOT DETECTED NOT DETECTED   Neisseria meningitidis NOT DETECTED NOT DETECTED   Pseudomonas aeruginosa NOT DETECTED NOT DETECTED   Stenotrophomonas maltophilia NOT DETECTED NOT DETECTED   Candida albicans NOT DETECTED NOT DETECTED   Candida auris NOT DETECTED NOT DETECTED   Candida glabrata NOT DETECTED NOT DETECTED   Candida krusei NOT DETECTED NOT DETECTED   Candida parapsilosis NOT DETECTED NOT DETECTED   Candida tropicalis NOT DETECTED NOT DETECTED   Cryptococcus neoformans/gattii NOT DETECTED NOT DETECTED   CTX-M ESBL NOT DETECTED NOT DETECTED   Carbapenem resistance IMP NOT DETECTED NOT DETECTED   Carbapenem resistance KPC NOT DETECTED NOT DETECTED   Carbapenem resistance NDM NOT DETECTED NOT DETECTED   Carbapenem resist OXA 48 LIKE NOT DETECTED NOT DETECTED   Carbapenem resistance VIM NOT DETECTED NOT DETECTED    Renee [name] 02/17/2024  7:30 PM

## 2024-02-17 NOTE — Sepsis Progress Note (Signed)
 Notified provider and bedside nurse of need to order repeat lactic acid  Lactic trend 2.3 then up to 3.6 message sent to MD and bedside RN asking if a 3rd lactic could be ordered.

## 2024-02-17 NOTE — Progress Notes (Addendum)
 eLink Physician-Brief Progress Note Patient Name: Renee [name] DOB: 04-Sep-1975 MRN: [id number]   Date of Service  02/17/2024  HPI/Events of Note  Notified of increased lactate level. Seen on camera. On 4-5 mic of levophed  (unchanged). No distress at all. Has not had  a bowel movement in ICU. No vomiting either. Is on D5LR. Had abdominal imaging earlier today. Has some lethargy (unchanged) but wakes up when RN is talking. RN palpated her belly and she is not tender anywhere, is soft and not wincing either. Surgery has also seen her. Plan for HIDA. I wonder if her lactate levels are also potentially from thiamine  deficiency. She does have E coli in blood.   eICU Interventions  LR bolus over 1 hour  Start thiamine   Recheck LA around midnight  DW RN  Let me know when LA results     Intervention Category Intermediate Interventions: Diagnostic test evaluation  Renee [name] 02/17/2024, 9:17 PM  955 pm - I discussed case with surgery on call Dr Paola. Exam also discussed with her. I had ordered a BMP and VBG to be done, with CBC. Will also get LFT. Surgery following for labs as well and we will update them with repeat LA at midnight   11:50 pm - VBG resulted now. Other labs all pending. Have ordered bicarb drip. Not ordered bolus till K results since it was low and repleted. BMP LFT Mag LA pending.   1255 am - Noted rise in LFT including bili and alk phos. K and mag supplementation ordered and also ordered bicarb push. LA is still pending. Dr Paola had requested to be notified once resulted.   240 am - LA has now resulted. Continues to rise. Per discussion with surgery earlier, will inform Dr Paola. Notified RN to let them know.   330 am - Dr Paola called. She had seen the patient. Very unclear source of lactic acidosis especially with minimal pressor needs and very benign belly exam. Will continue current management and await HIDA. I ordered repeat labs for 530 am. Appreciate surgery  input and discussions. Maybe LA is taking tikme to clear due to rise in liver enzymes.   658 am - Case discussed with Dr Geronimo. I see AM LA is down to 5.8 so will continue IV bicarb . ABG is pending. Day team to follow.

## 2024-02-17 NOTE — ED Provider Notes (Incomplete)
 " [place] EMERGENCY DEPARTMENT AT [facility] Provider Note   CSN: [id number] Arrival date & time: 02/17/24  9562     Patient presents with: Chest Pain   Renee [name] is a 49 y.o. female.  {Add pertinent medical, surgical, social history, OB history to HPI:32947}  Chest Pain 49 year old female with extensive past medical history brought to emergency department due to chest pain that began 1 hour prior to arrival. Chart review shows patient has extensive medical history including bipolar disorder, prior mesenteric ischemia, bariatric surgery, chronic pain, medical noncompliance, recurrent hypokalemia, hypothyroidism, possible adrenal insufficiency.  She was discharged from the hospital on 12/14/2023 after an extensive stay where she suffered from mesenteric ischemia related to an internal hernia with wound VAC closure and small bowel resection with jejunostomy tube insertion.  She had complicated recovery course with hemorrhagic shock and multiple electrolyte abnormalities that required treatment. Patient was reportedly at home in her normal state of health when she suddenly developed midline chest discomfort radiating to her back. EMS was called and they states she was hysterical and moving around in pain when they were trying to evaluate her.  They ultimately gave her 5 of Haldol and 4 of Zofran .     Prior to Admission medications  Medication Sig Start Date End Date Taking? Authorizing Provider  acetaminophen  (TYLENOL ) 325 MG tablet Take 2 tablets (650 mg total) by mouth every 6 (six) hours as needed for mild pain (pain score 1-3). 12/14/23   Angiulli, Toribio PARAS, PA-C  amphetamine -dextroamphetamine  (ADDERALL) 30 MG tablet Take 1 tablet by mouth 2 (two) times daily.    [provider]  calcium  citrate (CALCITRATE - DOSED IN MG ELEMENTAL CALCIUM ) 950 (200 Ca) MG tablet Take 1 tablet (950 mg total) by mouth 3 (three) times daily with meals. 12/18/23 01/17/24  Angiulli,  Toribio PARAS, PA-C  ciprofloxacin  (CIPRO ) 500 MG tablet Take 1 tablet (500 mg total) by mouth every 12 (twelve) hours. 12/28/23   Zelaya, Oscar A, PA-C  fluticasone  (FLONASE ) 50 MCG/ACT nasal spray Place 2 sprays into both nostrils daily as needed for allergies. 12/18/23   Angiulli, Toribio PARAS, PA-C  folic acid  (FOLVITE ) 1 MG tablet Take 1 tablet (1 mg total) by mouth daily. 12/18/23   Angiulli, Toribio PARAS, PA-C  hydrOXYzine  (ATARAX ) 25 MG tablet Take 1 tablet (25 mg total) by mouth every 6 (six) hours as needed for anxiety. 12/18/23   Angiulli, Toribio PARAS, PA-C  levothyroxine  (SYNTHROID ) 200 MCG tablet Take 1 tablet (200 mcg total) by mouth daily at 6 (six) AM. 12/18/23   Angiulli, Toribio PARAS, PA-C  magnesium  oxide (MAG-OX) 400 (240 Mg) MG tablet Take 1 tablet (400 mg total) by mouth 2 (two) times daily. 12/18/23   Angiulli, Toribio PARAS, PA-C  montelukast  (SINGULAIR ) 10 MG tablet Take 1 tablet (10 mg total) by mouth at bedtime. 12/18/23   Angiulli, Toribio PARAS, PA-C  Multiple Vitamin (MULTI-VITAMIN) tablet Take by mouth.    [provider]  omeprazole  (PRILOSEC) 40 MG capsule Take 1 capsule (40 mg total) by mouth 2 (two) times daily before a meal. 12/18/23 01/17/24  Angiulli, Toribio PARAS, PA-C  Oxycodone  HCl 10 MG TABS Take 1 tablet (10 mg total) by mouth every 4 (four) hours as needed for moderate pain (pain score 4-6) or severe pain (pain score 7-10) (1 for moderate pain; 2 for severe pain  hold for SBP < 90). 12/18/23   Angiulli, Toribio PARAS, PA-C  oxyCODONE -acetaminophen  (PERCOCET/ROXICET) 5-325 MG tablet Take 1  tablet by mouth every 6 (six) hours as needed for severe pain (pain score 7-10). 12/28/23   Zelaya, Oscar A, PA-C  PARoxetine  (PAXIL ) 10 MG tablet Take 1 tablet (10 mg total) by mouth daily. 12/18/23   Angiulli, Toribio PARAS, PA-C  thiamine  (VITAMIN B1) 100 MG tablet Take 1 tablet (100 mg total) by mouth daily. 12/18/23   Angiulli, Toribio PARAS, PA-C  traZODone  (DESYREL ) 150 MG tablet Take 0.5 tablets (75 mg  total) by mouth at bedtime. 12/18/23 01/17/24  Angiulli, Toribio PARAS, PA-C  Vitamin D , Ergocalciferol , (DRISDOL ) 1.25 MG (50000 UNIT) CAPS capsule Take 1 capsule (50,000 Units total) by mouth every 7 (seven) days. 12/18/23   Angiulli, Toribio PARAS, PA-C  Zinc  Sulfate 220 (50 Zn) MG TABS Take 1 tablet (220 mg total) by mouth daily. 12/18/23   Angiulli, Toribio PARAS, PA-C    Allergies: Morphine and codeine, Penicillins, Diphenhydramine, Latex, Synthroid  [levothyroxine ], and Allopurinol    Review of Systems  Cardiovascular:  Positive for chest pain.  All other systems reviewed and are negative.   Updated Vital Signs BP 111/61 (BP Location: Left Arm)   Pulse (!) 58   Temp (!) 95.8 F (35.4 C) (Rectal)   Resp (!) 22   Ht 5' 7 (1.702 m)   Wt 67 kg   LMP 03/02/2021 Comment: perimenopause  SpO2 100%   BMI 23.13 kg/m   Physical Exam  (all labs ordered are listed, but only abnormal results are displayed) Labs Reviewed  COMPREHENSIVE METABOLIC PANEL WITH GFR - Abnormal; Notable for the following components:      Result Value   Potassium 2.8 (*)    CO2 13 (*)    Glucose, Bld 108 (*)    Calcium  7.3 (*)    Total Protein 5.7 (*)    Albumin  3.3 (*)    AST 388 (*)    ALT 78 (*)    Alkaline Phosphatase 183 (*)    All other components within normal limits  CBC WITH DIFFERENTIAL/PLATELET - Abnormal; Notable for the following components:   RBC 3.62 (*)    Hemoglobin 11.1 (*)    HCT 34.4 (*)    RDW 16.8 (*)    All other components within normal limits  LIPASE, BLOOD - Abnormal; Notable for the following components:   Lipase 55 (*)    All other components within normal limits  I-STAT CG4 LACTIC ACID, ED - Abnormal; Notable for the following components:   Lactic Acid, Venous 2.3 (*)    All other components within normal limits  TROPONIN T, HIGH SENSITIVITY - Abnormal; Notable for the following components:   Troponin T High Sensitivity 27 (*)    All other components within normal limits   CULTURE, BLOOD (ROUTINE X 2)  CULTURE, BLOOD (ROUTINE X 2)  PRO BRAIN NATRIURETIC PEPTIDE    EKG: None  Radiology: CT Angio Chest/Abd/Pel for Dissection W and/or W/WO Result Date: 02/17/2024 CLINICAL DATA:  Chest pain and hypotension. Clinical concern for aortic dissection. EXAM: CT ANGIOGRAPHY CHEST, ABDOMEN AND PELVIS TECHNIQUE: Non-contrast CT of the chest was initially obtained. Multidetector CT imaging through the chest, abdomen and pelvis was performed using the standard protocol during bolus administration of intravenous contrast. Multiplanar reconstructed images and MIPs were obtained and reviewed to evaluate the vascular anatomy. RADIATION DOSE REDUCTION: This exam was performed according to the departmental dose-optimization program which includes automated exposure control, adjustment of the mA and/or kV according to patient size and/or use of iterative reconstruction technique. CONTRAST:  100mL  OMNIPAQUE  IOHEXOL  350 MG/ML SOLN COMPARISON:  Abdomen pelvis CT 12/28/2023 FINDINGS: CTA CHEST FINDINGS Cardiovascular: Pre contrast imaging shows no hyperdense crescent in the wall of the thoracic aorta to suggest the presence of an acute intramural hematoma. Imaging after IV contrast administration shows no thoracic aortic aneurysm. There is no dissection of the thoracic aorta. Enlargement of the pulmonary outflow tract/main pulmonary arteries suggests pulmonary arterial hypertension. No CT evidence for acute pulmonary embolus Mediastinum/Nodes: No mediastinal lymphadenopathy. There is no hilar lymphadenopathy. The esophagus has normal imaging features. There is no axillary lymphadenopathy. Lungs/Pleura: Dependent ground-glass opacity in both lungs is likely atelectatic. No dense focal airspace consolidation. No pulmonary edema or substantial pleural effusion. Musculoskeletal: Multiple old left rib fractures evident. Review of the MIP images confirms the above findings. CTA ABDOMEN AND PELVIS  FINDINGS VASCULAR Aorta: Normal caliber aorta without aneurysm, dissection, vasculitis or significant stenosis. Celiac: Patent without evidence of aneurysm, dissection, vasculitis or significant stenosis. SMA: Patent without evidence of aneurysm, dissection, vasculitis or significant stenosis. Renals: Both renal arteries are patent without evidence of aneurysm, dissection, vasculitis, fibromuscular dysplasia or significant stenosis. IMA: Patent without evidence of aneurysm, dissection, vasculitis or significant stenosis. Inflow: Patent without evidence of aneurysm, dissection, vasculitis or significant stenosis. Veins: No obvious venous abnormality within the limitations of this arterial phase study. Review of the MIP images confirms the above findings. NON-VASCULAR Hepatobiliary: Mildly heterogeneous liver perfusion on arterial phase imaging. Periportal edema evident. Gallbladder is massively dilated and progressive since prior measuring on the order of 16.6 x 7.0 cm today compared to 12.1 x 3.7 cm previously. No definite gallstones no perceptible gallbladder wall thickening or pericholecystic fluid. Common bile duct in the head of the pancreas measures up to 11 mm diameter increased from 7 mm previously. Pancreas: No focal mass lesion. No dilatation of the main duct. No intraparenchymal cyst. No peripancreatic edema. Spleen: No splenomegaly. No suspicious focal mass lesion. Adrenals/Urinary Tract: No adrenal nodule or mass. Kidneys unremarkable. No evidence for hydroureter. The urinary bladder appears normal for the degree of distention. Stomach/Bowel: Surgical changes noted in the stomach. 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 Multiple small bowel anastomoses are identified in the abdomen pelvis. Neither the terminal ileum nor the appendix are discernible. Patient has a patulous, markedly elongated/redundant colon. Although  there is diffuse generalized distention of small bowel and colon, appearance is not in a frankly obstructive pattern and the colon is diffusely patulous filled with gas and stool from the cecum to the rectum. No bowel wall thickening or pneumatosis. Mild perirectal edema noted on a background of diffuse body wall edema. Lymphatic: There is no gastrohepatic or hepatoduodenal ligament lymphadenopathy. No retroperitoneal or mesenteric lymphadenopathy. No pelvic sidewall lymphadenopathy. Reproductive: No adnexal mass. Other: No substantial intraperitoneal free fluid. Musculoskeletal: Diffuse body wall edema evident. No worrisome lytic or sclerotic osseous abnormality. Review of the MIP images confirms the above findings. IMPRESSION: 1. No evidence for thoracoabdominal aortic aneurysm or dissection. 2. Enlargement of the pulmonary outflow tract/main pulmonary arteries suggests pulmonary arterial hypertension. 3. Massively dilated gallbladder, progressive since prior. No definite gallstones or perceptible gallbladder wall thickening. Common bile duct in the head of the pancreas measures up to 11 mm diameter increased from 7 mm previously. Correlation with liver function test recommended. Right upper quadrant ultrasound may prove helpful to further evaluate. 4. Diffuse generalized distention of small bowel and colon, not in a frankly obstructive pattern. No bowel wall  thickening or pneumatosis. Imaging features may be related to diffuse ileus. 5. Mild perirectal edema, nonspecific, on a background of diffuse body wall edema. Electronically Signed   By: Camellia Candle M.D.   On: 02/17/2024 05:53    {Document cardiac monitor, telemetry assessment procedure when appropriate:32947} Procedures   Medications Ordered in the ED  lactated ringers  infusion (has no administration in time range)  lactated ringers  bolus 1,000 mL (1,000 mLs Intravenous New Bag/Given 02/17/24 0555)    And  lactated ringers  bolus 1,000 mL (has no  administration in time range)    And  lactated ringers  bolus 250 mL (has no administration in time range)  metroNIDAZOLE  (FLAGYL ) IVPB 500 mg (has no administration in time range)  ceFEPIme  (MAXIPIME ) 2 g in sodium chloride  0.9 % 100 mL IVPB (2 g Intravenous New Bag/Given 02/17/24 0601)  iohexol  (OMNIPAQUE ) 350 MG/ML injection 100 mL (100 mLs Intravenous Contrast Given 02/17/24 0521)  HYDROmorphone  (DILAUDID ) injection 0.5 mg (0.5 mg Intravenous Given 02/17/24 0612)      {Click here for ABCD2, HEART and other calculators REFRESH Note before signing:1}                              Medical Decision Making Amount and/or Complexity of Data Reviewed Labs: ordered. Radiology: ordered.  Risk Prescription drug management.   Patient arrived with abnormal vitals and appeared unwell.  She would be intermittently sleeping did wake up and become very agitated towards staff. She has heart rate in the 50s-70s that appears to be junctional.  Chart review from prior shows this is her baseline.  She was hypotensive and cold upon arrival.  SpO2 was 100% on room air.  She continued to report chest discomfort and is yelling out for pain control.  Workup was initiated including a sepsis workup due to her abnormal vitals.  We transferred her emergently to the CT scanner due to concern regarding aortic dissection. Patient started on sepsis fluids with cultures, lactic, and broad-spectrum antibiotics ordered. We proceeded to give cefepime  and Flagyl  to treat for any underlying infection.  She continued to yell out in pain and ultimately received half a milligram of Dilaudid  for pain control once her blood pressure had improved with IV fluids. Patient's lactic acid was slightly elevated at 2.3.  Workup revealed hypokalemia at 2.8.  This appears to be around her baseline.  Patient has bicarb of 13 and normal anion gap.  Elevated LFTs also noted.  Patient's initial troponin is 27 and we do not have any priors to  compare to. EKG showing junctional rhythm with a rate of 52.  No clear evidence of ischemia.  Patient CTA showing no evidence of dissection.  Her CT does show a massively dilated gallbladder larger than prior imaging.  No thickening or gallstones noted.  Increased size of her common bile duct had of the pancreas.  She also has some diffuse trauma sustention of her small bowel and colon.  This could be suggestive of ileus.  Mild peritoneal edema  Final diagnoses:  None    ED Discharge Orders     None        "

## 2024-02-17 NOTE — ED Notes (Signed)
 CCMD called.

## 2024-02-17 NOTE — ED Notes (Signed)
 Patient is much more calm and comfortable at present laying on left side returned from CT

## 2024-02-17 NOTE — ED Notes (Signed)
 Warm blankets applied

## 2024-02-17 NOTE — ED Provider Notes (Signed)
 Patient initially seen by Dr. Corinthia.  Please see her note.  Patient has number of medical problems.  She presented to the ED with complaints of chest discomfort.  Patient's presentation was concerning for the possibility of sepsis as she was noted to be hypotensive. Physical Exam  BP (!) 86/64   Pulse 100   Temp (!) 95.8 F (35.4 C) (Rectal)   Resp 20   Ht 1.702 m (5' 7)   Wt 67 kg   LMP 03/02/2021 Comment: perimenopause  SpO2 100%   BMI 23.13 kg/m   Physical Exam Vitals and nursing note reviewed.  Constitutional:      Appearance: She is ill-appearing.  HENT:     Head: Normocephalic and atraumatic.     Right Ear: External ear normal.     Left Ear: External ear normal.  Eyes:     General: No scleral icterus.       Right eye: No discharge.        Left eye: No discharge.     Conjunctiva/sclera: Conjunctivae normal.  Neck:     Trachea: No tracheal deviation.  Cardiovascular:     Rate and Rhythm: Normal rate.  Pulmonary:     Effort: Pulmonary effort is normal. No respiratory distress.     Breath sounds: No stridor.  Abdominal:     General: There is no distension.     Comments: Patient denies any abdominal tenderness on exam right now  Musculoskeletal:        General: No swelling or deformity.     Cervical back: Neck supple.  Skin:    General: Skin is warm and dry.     Findings: No rash.  Neurological:     Mental Status: She is alert. Mental status is at baseline.     Cranial Nerves: No dysarthria or facial asymmetry.     Motor: No seizure activity.     Procedures  .Critical Care  Performed by: Randol Simmonds, MD Authorized by: Randol Simmonds, MD   Critical care provider statement:    Critical care time (minutes):  30   Critical care was time spent personally by me on the following activities:  Development of treatment plan with patient or surrogate, discussions with consultants, evaluation of patient's response to treatment, examination of patient, ordering and review  of laboratory studies, ordering and review of radiographic studies, ordering and performing treatments and interventions, pulse oximetry, re-evaluation of patient's condition and review of old charts   ED Course / MDM   Clinical Course as of 02/17/24 1002  Sat Feb 17, 2024  0942 Patient's labs reveal elevated lactic acid level. [JK]  [id number] Troponin T, High Sensitivity(!) Troponins are stable. [JK]  [id number] Comprehensive metabolic panel(!) LFT elevation is new compared to previous [JK]  0944 CAse discussed with Dr Teresa.  Will see pt in consultation. [JK]  [id number] Case discussed with NP Bowser, PCCM.  Will see pt in the ED [JK]    Clinical Course User Index [JK] Randol Simmonds, MD   Clinical Course as of 02/17/24 1002  Sat Feb 17, 2024  9057 Patient's labs reveal elevated lactic acid level. [JK]  [id number] Troponin T, High Sensitivity(!) Troponins are stable. [JK]  [id number] Comprehensive metabolic panel(!) LFT elevation is new compared to previous [JK]  0944 CAse discussed with Dr Teresa.  Will see pt in consultation. [JK]  [id number] Case discussed with NP Bowser, PCCM.  Will see pt in the ED [JK]    Clinical Course User Index [JK]  Randol Simmonds, MD    Medical Decision Making Problems Addressed: Cholecystitis: acute illness or injury that poses a threat to life or bodily functions Hypokalemia: acute illness or injury that poses a threat to life or bodily functions Ileus [facility]): acute illness or injury that poses a threat to life or bodily functions Sepsis, due to unspecified organism, unspecified whether acute organ dysfunction present [facility]): acute illness or injury that poses a threat to life or bodily functions  Amount and/or Complexity of Data Reviewed Labs: ordered. Decision-making details documented in ED Course. Radiology: ordered and independent interpretation performed.  Risk Prescription drug management. Decision regarding hospitalization.   Patient's ultrasound was pending at the time of  shift change.  It does show findings concerning for acute cholecystitis.  On repeat exam patient states she has not having any upper abdominal tenderness at this time.  Patient has multiple laboratory abnormalities including elevated  LFTs hypokalemia.  Her blood pressure does remain low and her second lactic acid level increased.  With history of possible adrenal insufficiency will give a dose of hydrocortisone .  I will also order peripheral vasopressors.  I have consulted with general surgery and I will consult with critical care for admission       Randol Simmonds, MD 02/17/24 1002
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
## 2024-02-18 ENCOUNTER — Inpatient Hospital Stay (HOSPITAL_COMMUNITY)

## 2024-02-18 DIAGNOSIS — A419 Sepsis, unspecified organism: Secondary | ICD-10-CM

## 2024-02-18 DIAGNOSIS — R7401 Elevation of levels of liver transaminase levels: Secondary | ICD-10-CM | POA: Diagnosis not present

## 2024-02-18 DIAGNOSIS — E8729 Other acidosis: Secondary | ICD-10-CM

## 2024-02-18 DIAGNOSIS — E46 Unspecified protein-calorie malnutrition: Secondary | ICD-10-CM | POA: Diagnosis not present

## 2024-02-18 DIAGNOSIS — E876 Hypokalemia: Secondary | ICD-10-CM | POA: Diagnosis not present

## 2024-02-18 DIAGNOSIS — G9340 Encephalopathy, unspecified: Secondary | ICD-10-CM | POA: Diagnosis not present

## 2024-02-18 DIAGNOSIS — D696 Thrombocytopenia, unspecified: Secondary | ICD-10-CM

## 2024-02-18 DIAGNOSIS — E039 Hypothyroidism, unspecified: Secondary | ICD-10-CM | POA: Diagnosis not present

## 2024-02-18 DIAGNOSIS — R6521 Severe sepsis with septic shock: Secondary | ICD-10-CM

## 2024-02-18 LAB — BASIC METABOLIC PANEL WITH GFR
Anion gap: 15 (ref 5–15)
Anion gap: 14 (ref 5–15)
BUN: 13 mg/dL (ref 6–20)
BUN: 11 mg/dL (ref 6–20)
CO2: 13 mmol/L — ABNORMAL LOW (ref 22–32)
CO2: 18 mmol/L — ABNORMAL LOW (ref 22–32)
Calcium: 7.3 mg/dL — ABNORMAL LOW (ref 8.9–10.3)
Calcium: 7.3 mg/dL — ABNORMAL LOW (ref 8.9–10.3)
Chloride: 115 mmol/L — ABNORMAL HIGH (ref 98–111)
Chloride: 112 mmol/L — ABNORMAL HIGH (ref 98–111)
Creatinine, Ser: 0.64 mg/dL (ref 0.44–1.00)
Creatinine, Ser: 0.6 mg/dL (ref 0.44–1.00)
GFR, Estimated: >60 mL/min
GFR, Estimated: >60 mL/min
Glucose, Bld: 88 mg/dL (ref 70–99)
Glucose, Bld: 88 mg/dL (ref 70–99)
Potassium: 3.2 mmol/L — ABNORMAL LOW (ref 3.5–5.1)
Potassium: 3.2 mmol/L — ABNORMAL LOW (ref 3.5–5.1)
Sodium: 143 mmol/L (ref 135–145)
Sodium: 144 mmol/L (ref 135–145)

## 2024-02-18 LAB — CBC
HCT: 33.6 % — ABNORMAL LOW (ref 36.0–46.0)
Hemoglobin: 10.6 g/dL — ABNORMAL LOW (ref 12.0–15.0)
MCH: 30.5 pg (ref 26.0–34.0)
MCHC: 31.5 g/dL (ref 30.0–36.0)
MCV: 96.6 fL (ref 80.0–100.0)
Platelets: 130 10*3/uL — ABNORMAL LOW (ref 150–400)
RBC: 3.48 MIL/uL — ABNORMAL LOW (ref 3.87–5.11)
RDW: 17.3 % — ABNORMAL HIGH (ref 11.5–15.5)
WBC: 14.4 10*3/uL — ABNORMAL HIGH (ref 4.0–10.5)
nRBC: 0 % (ref 0.0–0.2)

## 2024-02-18 LAB — MAGNESIUM
Magnesium: 1.5 mg/dL — ABNORMAL LOW (ref 1.7–2.4)
Magnesium: 1.8 mg/dL (ref 1.7–2.4)

## 2024-02-18 LAB — GLUCOSE, CAPILLARY
Glucose-Capillary: 106 mg/dL — ABNORMAL HIGH (ref 70–99)
Glucose-Capillary: 136 mg/dL — ABNORMAL HIGH (ref 70–99)
Glucose-Capillary: 74 mg/dL (ref 70–99)
Glucose-Capillary: 75 mg/dL (ref 70–99)
Glucose-Capillary: 75 mg/dL (ref 70–99)
Glucose-Capillary: 92 mg/dL (ref 70–99)

## 2024-02-18 LAB — RETICULOCYTES
Immature Retic Fract: 8.6 % (ref 2.3–15.9)
RBC.: 3.19 MIL/uL — ABNORMAL LOW (ref 3.87–5.11)
Retic Count, Absolute: 38 10*3/uL (ref 19.0–186.0)
Retic Ct Pct: 1.2 % (ref 0.4–3.1)

## 2024-02-18 LAB — BLOOD GAS, VENOUS
Acid-base deficit: 4.4 mmol/L — ABNORMAL HIGH (ref 0.0–2.0)
Bicarbonate: 22.6 mmol/L (ref 20.0–28.0)
O2 Saturation: 55.2 %
Patient temperature: 36.4
pCO2, Ven: 47 mmHg (ref 44–60)
pH, Ven: 7.29 (ref 7.25–7.43)
pO2, Ven: 35 mmHg (ref 32–45)

## 2024-02-18 LAB — IRON AND TIBC
Iron: 21 ug/dL — ABNORMAL LOW (ref 28–170)
Saturation Ratios: 10 % — ABNORMAL LOW (ref 10.4–31.8)
TIBC: 217 ug/dL — ABNORMAL LOW (ref 250–450)
UIBC: 196 ug/dL

## 2024-02-18 LAB — COMPREHENSIVE METABOLIC PANEL WITH GFR
ALT: 537 U/L — ABNORMAL HIGH (ref 0–44)
AST: 1654 U/L — ABNORMAL HIGH (ref 15–41)
Albumin: 2.5 g/dL — ABNORMAL LOW (ref 3.5–5.0)
Alkaline Phosphatase: 386 U/L — ABNORMAL HIGH (ref 38–126)
Anion gap: 17 — ABNORMAL HIGH (ref 5–15)
BUN: 12 mg/dL (ref 6–20)
CO2: 11 mmol/L — ABNORMAL LOW (ref 22–32)
Calcium: 7.4 mg/dL — ABNORMAL LOW (ref 8.9–10.3)
Chloride: 114 mmol/L — ABNORMAL HIGH (ref 98–111)
Creatinine, Ser: 0.63 mg/dL (ref 0.44–1.00)
GFR, Estimated: >60 mL/min
Glucose, Bld: 77 mg/dL (ref 70–99)
Potassium: 3.4 mmol/L — ABNORMAL LOW (ref 3.5–5.1)
Sodium: 142 mmol/L (ref 135–145)
Total Bilirubin: 1.7 mg/dL — ABNORMAL HIGH (ref 0.0–1.2)
Total Protein: 5 g/dL — ABNORMAL LOW (ref 6.5–8.1)

## 2024-02-18 LAB — VITAMIN B12: Vitamin B-12: 929 pg/mL — ABNORMAL HIGH (ref 180–914)

## 2024-02-18 LAB — DIC (DISSEMINATED INTRAVASCULAR COAGULATION)PANEL
D-Dimer, Quant: 1.79 ug{FEU}/mL — ABNORMAL HIGH (ref 0.00–0.50)
Fibrinogen: 294 mg/dL (ref 210–475)
INR: 1.6 — ABNORMAL HIGH (ref 0.8–1.2)
Platelets: 77 10*3/uL — ABNORMAL LOW (ref 150–400)
Prothrombin Time: 20 s — ABNORMAL HIGH (ref 11.4–15.2)
Smear Review: NONE SEEN
aPTT: 47 s — ABNORMAL HIGH (ref 24–36)

## 2024-02-18 LAB — PROTIME-INR
INR: 1.9 — ABNORMAL HIGH (ref 0.8–1.2)
INR: 1.7 — ABNORMAL HIGH (ref 0.8–1.2)
Prothrombin Time: 23.1 s — ABNORMAL HIGH (ref 11.4–15.2)
Prothrombin Time: 20.4 s — ABNORMAL HIGH (ref 11.4–15.2)

## 2024-02-18 LAB — CBC WITH DIFFERENTIAL/PLATELET
Basophils Relative: 0 K/uL (ref 0.0–0.1)
Eosinophils Absolute: 0 K/uL (ref 0.0–0.5)
Eosinophils Relative: 0 K/uL (ref 0.0–0.5)
HCT: 34.8 % — ABNORMAL LOW (ref 36.0–46.0)
Hemoglobin: 10.9 g/dL — ABNORMAL LOW (ref 12.0–15.0)
Lymphocytes Relative: 9 %
Lymphs Abs: 0.6 K/uL — AB (ref 0.7–4.0)
MCH: 30.6 pg (ref 26.0–34.0)
MCHC: 31.3 g/dL (ref 30.0–36.0)
MCV: 97.8 fL (ref 80.0–100.0)
Monocytes Absolute: 0 K/uL (ref 0.1–1.0)
Monocytes Relative: 0.1 K/uL (ref 0.1–1.0)
Monocytes Relative: 1 K/uL (ref 0.7–4.0)
Neutro Abs: 6.4 10*3/uL (ref 1.7–7.7)
Neutrophils Relative %: 90 %
Platelets: 105 10*3/uL — ABNORMAL LOW (ref 150–400)
RBC: 3.56 MIL/uL — ABNORMAL LOW (ref 3.87–5.11)
RDW: 17.1 % — ABNORMAL HIGH (ref 11.5–15.5)
WBC: 7.1 10*3/uL (ref 4.0–10.5)
nRBC: 0 % (ref 0.0–0.2)

## 2024-02-18 LAB — HEPATIC FUNCTION PANEL
ALT: 531 U/L — ABNORMAL HIGH (ref 0–44)
AST: 1947 U/L — ABNORMAL HIGH (ref 15–41)
Albumin: 2.6 g/dL — ABNORMAL LOW (ref 3.5–5.0)
Alkaline Phosphatase: 368 U/L — ABNORMAL HIGH (ref 38–126)
Bilirubin, Direct: 0.7 mg/dL — ABNORMAL HIGH (ref 0.0–0.2)
Indirect Bilirubin: 0.8 mg/dL (ref 0.3–0.9)
Total Bilirubin: 1.5 mg/dL — ABNORMAL HIGH (ref 0.0–1.2)
Total Protein: 4.8 g/dL — ABNORMAL LOW (ref 6.5–8.1)

## 2024-02-18 LAB — PHOSPHORUS: Phosphorus: 1.9 mg/dL — ABNORMAL LOW (ref 2.5–4.6)

## 2024-02-18 LAB — VITAMIN D 25 HYDROXY (VIT D DEFICIENCY, FRACTURES): Vit D, 25-Hydroxy: <6 ng/mL — ABNORMAL LOW (ref 30–100)

## 2024-02-18 LAB — SALICYLATE LEVEL: Salicylate Lvl: <7 mg/dL — ABNORMAL LOW (ref 7.0–30.0)

## 2024-02-18 LAB — CK TOTAL AND CKMB (NOT AT ARMC)
CK, MB: 5.3 ng/mL — ABNORMAL HIGH (ref 0.5–5.0)
Total CK: 237 U/L — ABNORMAL HIGH (ref 38–234)

## 2024-02-18 LAB — HEPATITIS PANEL, ACUTE
HCV Ab: NONREACTIVE
Hep A IgM: NONREACTIVE
Hep B C IgM: NONREACTIVE
Hepatitis B Surface Ag: NONREACTIVE

## 2024-02-18 LAB — LACTIC ACID, PLASMA
Lactic Acid, Venous: 7.3 mmol/L (ref 0.5–1.9)
Lactic Acid, Venous: 5.8 mmol/L (ref 0.5–1.9)
Lactic Acid, Venous: 2.7 mmol/L (ref 0.5–1.9)

## 2024-02-18 LAB — FOLATE: Folate: 6.1 ng/mL

## 2024-02-18 LAB — FERRITIN: Ferritin: 127 ng/mL (ref 11–307)

## 2024-02-18 LAB — PROCALCITONIN: Procalcitonin: 2.12 ng/mL

## 2024-02-18 LAB — T4, FREE: Free T4: 0.45 ng/dL — ABNORMAL LOW (ref 0.80–2.00)

## 2024-02-18 LAB — ACETAMINOPHEN LEVEL: Acetaminophen (Tylenol), Serum: <10 ug/mL — ABNORMAL LOW (ref 10–30)

## 2024-02-18 MED ORDER — SODIUM CHLORIDE 0.9% FLUSH: 10.0000 mL | INTRAVENOUS | Status: DC | PRN

## 2024-02-18 MED ORDER — SODIUM BICARBONATE 8.4 % IV SOLN
50.0000 meq | Freq: Once | INTRAVENOUS | Status: AC
  Administered 2024-02-18: 50 meq via INTRAVENOUS
  Filled 2024-02-18: qty 50

## 2024-02-18 MED ORDER — ACETYLCYSTEINE LOAD VIA INFUSION
150.0000 mg/kg | Freq: Once | INTRAVENOUS | Status: AC
  Administered 2024-02-18: 10035 mg via INTRAVENOUS
  Filled 2024-02-18: qty 329

## 2024-02-18 MED ORDER — POTASSIUM CHLORIDE 20 MEQ PO PACK: 40.0000 meq | PACK | ORAL | Status: DC

## 2024-02-18 MED ORDER — SODIUM CHLORIDE 0.9% FLUSH
10.0000 mL | Freq: Two times a day (BID) | INTRAVENOUS | Status: DC
  Administered 2024-02-18: 10 mL
  Administered 2024-02-19: 20 mL
  Administered 2024-02-19 – 2024-02-20 (×2): 10 mL

## 2024-02-18 MED ORDER — TECHNETIUM TC 99M MEBROFENIN IV KIT
5.4000 | PACK | Freq: Once | INTRAVENOUS | Status: AC | PRN
  Administered 2024-02-18: 5.4 via INTRAVENOUS

## 2024-02-18 MED ORDER — MAGNESIUM SULFATE 2 GM/50ML IV SOLN
2.0000 g | Freq: Once | INTRAVENOUS | Status: AC
  Administered 2024-02-18: 2 g via INTRAVENOUS
  Filled 2024-02-18: qty 50

## 2024-02-18 MED ORDER — SODIUM CHLORIDE 0.9 % IV SOLN
2.0000 g | INTRAVENOUS | Status: DC
  Administered 2024-02-18 – 2024-02-19 (×2): 2 g via INTRAVENOUS
  Filled 2024-02-18 (×2): qty 20

## 2024-02-18 MED ORDER — DEXTROSE 5 % IV SOLN
6.2500 mg/kg/h | INTRAVENOUS | Status: DC
  Administered 2024-02-19: 6.25 mg/kg/h via INTRAVENOUS
  Filled 2024-02-18: qty 90

## 2024-02-18 MED ORDER — POTASSIUM CHLORIDE CRYS ER 20 MEQ PO TBCR
40.0000 meq | EXTENDED_RELEASE_TABLET | ORAL | Status: AC
  Administered 2024-02-18 (×2): 40 meq via ORAL
  Filled 2024-02-18 (×2): qty 2

## 2024-02-18 MED ORDER — CALCIUM GLUCONATE-NACL 2-0.675 GM/100ML-% IV SOLN
2.0000 g | Freq: Once | INTRAVENOUS | Status: AC
  Administered 2024-02-18: 2000 mg via INTRAVENOUS
  Filled 2024-02-18: qty 100

## 2024-02-18 MED ORDER — POTASSIUM CHLORIDE 10 MEQ/100ML IV SOLN
10.0000 meq | INTRAVENOUS | Status: AC
  Administered 2024-02-18 (×4): 10 meq via INTRAVENOUS
  Filled 2024-02-18 (×4): qty 100

## 2024-02-18 MED ORDER — DEXTROSE 5 % IV SOLN
12.5000 mg/kg/h | INTRAVENOUS | Status: DC
  Administered 2024-02-18: 12.5 mg/kg/h via INTRAVENOUS
  Filled 2024-02-18: qty 90

## 2024-02-18 MED ORDER — HYDROCORTISONE SOD SUC (PF) 100 MG IJ SOLR: 100.0000 mg | Freq: Two times a day (BID) | INTRAMUSCULAR | Status: DC

## 2024-02-18 NOTE — Progress Notes (Signed)
 Peripherally Inserted Central Catheter Placement  The IV Nurse has discussed with the patient and/or persons authorized to consent for the patient, the purpose of this procedure and the potential benefits and risks involved with this procedure.  The benefits include less needle sticks, lab draws from the catheter, and the patient may be discharged home with the catheter. Risks include, but not limited to, infection, bleeding, blood clot (thrombus formation), and puncture of an artery; nerve damage and irregular heartbeat and possibility to perform a PICC exchange if needed/ordered by physician.  Alternatives to this procedure were also discussed.  Bard Power PICC patient education guide, fact sheet on infection prevention and patient information card has been provided to patient /or left at bedside.    PICC Placement Documentation  PICC Triple Lumen 02/18/24 Right Basilic 31 cm 0 cm (Active)  Indication for Insertion or Continuance of Line Vasoactive infusions 02/18/24 1505  Exposed Catheter (cm) 0 cm 02/18/24 1505  Site Assessment Clean, Dry, Intact 02/18/24 1505  Lumen #1 Status Flushed;Blood return noted;Saline locked 02/18/24 1505  Lumen #2 Status Flushed;Blood return noted;Saline locked 02/18/24 1505  Lumen #3 Status Flushed;Blood return noted;Saline locked 02/18/24 1505  Dressing Type Transparent 02/18/24 1505  Dressing Status Antimicrobial disc/dressing in place 02/18/24 1505  Line Care Connections checked and tightened 02/18/24 1505  Line Adjustment (NICU/IV Team Only) No 02/18/24 1505  Dressing Intervention New dressing 02/18/24 1505  Dressing Change Due 02/25/24 02/18/24 1505       Renee [name] 02/18/2024, 3:12 PM

## 2024-02-18 NOTE — Plan of Care (Signed)
" °  Problem: Clinical Measurements: Goal: Ability to maintain clinical measurements within normal limits will improve Outcome: Progressing Goal: Respiratory complications will improve Outcome: Progressing Goal: Cardiovascular complication will be avoided Outcome: Progressing   Problem: Activity: Goal: Risk for activity intolerance will decrease Outcome: Progressing   Problem: Coping: Goal: Level of anxiety will decrease Outcome: Progressing   Problem: Elimination: Goal: Will not experience complications related to bowel motility Outcome: Progressing Goal: Will not experience complications related to urinary retention Outcome: Progressing   Problem: Safety: Goal: Ability to remain free from injury will improve Outcome: Progressing   Problem: Skin Integrity: Goal: Risk for impaired skin integrity will decrease Outcome: Progressing   "

## 2024-02-18 NOTE — Progress Notes (Signed)
 Notiifed by primary service of uptrending LA despite benign exam and stable hemodynamics. Repeat labs sent with worsening transaminitis. Patient seen and examined. LA 7.3 from 6.8, but benign abdominal exam even to deep palpation. Patient denies any abdominal pain. Levo at 1 from 5 earlier this PM. No indication for emergent surgical intervention, though I would recommend touching base with IR later this morning for perc chole tube eval. Case d/w primary attending after my evaluation.   Dreama GEANNIE Hanger, MD General and Trauma Surgery [facility] Surgery

## 2024-02-18 NOTE — H&P (Addendum)
 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 Initial workup in ED was assuring against cardiac event and dissection. Imaging did show a significantly distended gallbladder -- incr from prior imaging -- as well as incr CBD diameter, and non-obstructive bowel distension. RUQ US  w distended gb and diffuse wall thickening c/f acute chole.   Labs w hypoK hypomag, elevated LFTs, metabolic acidosis Normal WBC, hypothermic.  Rcvd flagyl  cefepime .     CCS consulted   She has had soft Bps -- MAPs generally > 65 but borderline-- and a rising LA in the ED.  PCCM called for admission in this setting   On my eval she denies current chest pain.  No abd pain, no n/v/d/c.  Eating/drinking a little less lately but not a significant change.   Pertinent  Medical History  Bariatric surgery, duodenal switch Mesenteric ischemia, bowel resection Ptsd mdd gad bipolar   Significant Hospital Events: Including procedures, antibiotic start and stop dates in addition to other pertinent events   02/17/24 to ED w chest pain. C/f acute chole. 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 Not on pressors. On bic gtt. HJas persistent.rising lactic acidosis. AFebrile.  RN c/p stool is very foul smelling and pasty. On ABx. Lactate > 7 and improved slightly to 5.  LFT rising  AST > 1K, INR 1.9. Last tylenol  check  2023. Seen by surgery.  Patient has a history of bariatric surgery duodenal switch and has not been taking multivitamins due to financial issues.  She is at risk for severe vitamin deficiencies.  They are recommending a HIDA scan empiric antibiotics and vitamin levels.  RN reports mottling at admission in the lower extremities  Objective    Blood pressure (!) 89/67, pulse 64, temperature 97.6 F (36.4 C), temperature source Oral, resp. rate 10, height 5' 7 (1.702 m), weight 66.9 kg, last menstrual period 03/02/2021, SpO2 93%.        Intake/Output Summary (Last 24 hours) at 02/18/2024 1037 Last data filed at 02/18/2024 9078 Gross per 24 hour  Intake 5575.77 ml  Output 150 ml  Net 5425.77 ml   Filed Weights   02/17/24 0448 02/17/24 1445 02/18/24 0450  Weight: 67 kg 64.4 kg 66.9 kg    Examination:  General Appearance:  Looks chronically unwell, ROOM SMELLS Ofo FOUL STOOL Head:  Normocephalic, without obvious abnormality, atraumatic Eyes:  PERRL -left eye enucleation, conjunctiva/corneas -right MID conjunctiva     Ears:  Normal external ear canals, both ears Nose:  G tube -oxygen 2 L pulse ox 97% Throat:  ETT TUBE - no , OG tube - no Neck:  Supple,  No enlargement/tenderness/nodules Lungs: Clear to auscultation bilaterally, Heart:  S1 and S2 normal, no murmur, CVP - no.  Pressors - no Abdomen:  Soft, no masses, no organomegaly.  Nontender no rebound. Genitalia / Rectal:  Not  done Extremities:  Extremities- intact she has mottling in the lower extremities Skin:  ntact in exposed areas . Sacral area - not examind Neurologic:  Sedation - none -> RASS - +1 . Moves all 4s - yes. CAM-ICU - neg . Orientation - x3+      Resolved problem list   Assessment and Plan   Acute encephalopathy -septic shock ?medication related  1/25: No encephalopathy  P -minimize cns depressing meds -delirium precautions -tx underlying causes as below  Septic shock  -+/- component of  hypovolemia.   Severe sepsis Possible acute chole  Elevated Lfts   1/25: LFT worse, INR ? Rising off pressors.  P - DC HYDROCORT  - Check acute hepatitis panel - Check salicylate level - Check Tylenol  level - check Procla.//track lacate - START EMPIRIC N-ACETYLCYSTEINE  02/08/24  -= Await HIDA scan -Discussed with surgery  - -follow bcx, send ua -PCN allergy -- will cont flagyl  , cefepien -d/w ccs -- possible HIDA scan (RN calling nuclear medicine 02/18/2024)  -NPO sips w meds ok  -cont mIVF as below + additional 1L LR bolus over 4hr   -periph NE for MAP > 65, if needed   Lactic acidosis NAGMA   ?  Slowly improving.  On bicarbonate drip at 125 cc/h  P -trend Lactate -fluid resusc, BP support, tx underlying causes - Continue bicarbonate infusion  Hypomagnesemia Hypokalemia  02/18/2024: Repleted but needs phosphorus check P -Check phosphorus levels  Hx bariatric surgeries Hx bowel resection Chronic malnutrition  Vitamin deficiencies due to noncompliance P -when appropriate RDN consult -- is NPO at present so dont think this would be of great utility  - ORder IV MVT  Anemia -has chronic anemia between 8 and 10 g%  Plan - - PRBC for hgb </= 6.9gm%    - exceptions are   -  if ACS susepcted/confirmed then transfuse for hgb </= 8.0gm%,  or    -  active bleeding with hemodynamic instability, then transfuse regardless of hemoglobin value   At at all times try to transfuse 1 unit prbc as possible with exception of active hemorrhage  New onset thrombocytopenia 02/17/2024 [baseline between 400 and 500.)  - 02/18/2024 platelet count 130K  Plan  - Check DIC panel - check INR - Okay to continue Lovenox  for now and   Chest pain, resolved - -ED workup assuring against coronary event, dissection, PE and pericardial effusion   Trop level20s  Plan  - get echo  Chronic Hypothyroidism since 2021    - TSH 306 in 2021 -> improved to 147 in Nov 2025  - 02/17/24: at  admit 228  Plan  -s tart synthroid   - check on complicance   Difficult IV access  Plan -IV team consult     [facility] 1/25: Patient updated at bedside. No family at bedside   ATTESTATION & SIGNATURE   The patient Renee [name] is critically ill with multiple organ systems failure and requires high complexity decision making for assessment and support, frequent evaluation and titration of therapies, application of advanced monitoring technologies and extensive interpretation of multiple databases and discussion with other appropriate health care personnel such as bedside nurses, social workers, case production designer, theatre/television/film, consultants, respiratory therapists, nutritionists, secretaries etc.,  Critical care time includes but is not restricted to just documentation time. Documentation can happen in parallel or sequential to care time depending on case mix urgency and priorities for the shift. So, overall critical Care Time devoted to patient care services described in this note  is  45  Minutes.   This time reflects time of care of this signee Dr Dorethia Cave which includ does not reflect procedure time, or teaching time or supervisory time of PA/NP/Med student/Med Resident etc but could involve care discussion time     Dr. Dorethia Cave, M.D., [facility].C.P Pulmonary and Critical Care Medicine Staff Physician, [facility] [place] [facility] Pager: [phone], If no answer or between  15:00h - 7:00h: call [phone]  02/18/2024 10:38 AM   LABS    PULMONARY Recent Labs  Lab 02/17/24 2301 02/18/24 0757  HCO3 17.6* 22.6  O2SAT 32.2 55.2    CBC Recent Labs  Lab 02/17/24 1052 02/17/24 2302 02/18/24 0242  HGB 11.5* 10.9* 10.6*  HCT 35.4* 34.8* 33.6*  WBC 2.0* 7.1 14.4*  PLT 136* 105* 130*    COAGULATION Recent Labs  Lab 02/18/24 0759  INR 1.9*    CARDIAC  No results for input(s): TROPONINI in the last 168 hours. Recent Labs  Lab  02/17/24 0503  PROBNP 77.0     CHEMISTRY Recent Labs  Lab 02/17/24 0503 02/17/24 0721 02/17/24 1359 02/17/24 2302 02/18/24 0242 02/18/24 0759  NA 137  --  140 143 142 144  K 2.8*  --  2.9* 3.2* 3.4* 3.2*  CL 111  --  111 115* 114* 112*  CO2 13*  --  15* 13* 11* 18*  GLUCOSE 108*  --  69* 88 77 88  BUN 12  --  12 13 12 11   CREATININE 0.63  --  0.61 0.64 0.63 0.60  CALCIUM  7.3*  --  7.4* 7.3* 7.4* 7.3*  MG  --  1.3*  --  1.5* 1.8  --    Estimated Creatinine Clearance: 83.6 mL/min (by C-G formula based on SCr of 0.6 mg/dL).   LIVER Recent Labs  Lab 02/17/24 0503 02/17/24 2302 02/18/24 0242 02/18/24 0759  AST 388* 1,947* 1,654*  --   ALT 78* 531* 537*  --   ALKPHOS 183* 368* 386*  --   BILITOT 0.9 1.5* 1.7*  --   PROT 5.7* 4.8* 5.0*  --   ALBUMIN  3.3* 2.6* 2.5*  --   INR  --   --   --  1.9*     INFECTIOUS Recent Labs  Lab 02/17/24 2010 02/18/24 0145 02/18/24 0530  LATICACIDVEN 6.8* 7.3* 5.8*     ENDOCRINE CBG (last 3)  Recent Labs    02/17/24 2338 02/18/24 0357 02/18/24 0724  GLUCAP 132* 92 75         IMAGING x48h  - image(s) personally visualized  -   highlighted in bold US  EKG SITE RITE Result Date: 02/17/2024 If Site Rite image not attached, placement could not be confirmed due to current cardiac rhythm.  US  Abdomen Limited RUQ (LIVER/GB) Result Date: 02/17/2024 CLINICAL DATA:  Abdominal pain. Distended gallbladder and biliary dilatation on recent CT. EXAM: ULTRASOUND ABDOMEN LIMITED RIGHT UPPER QUADRANT COMPARISON:  CT on 02/17/2024 FINDINGS: Gallbladder: Markedly dilated gallbladder measuring 13.0 x 6.1 cm. Echogenic sludge is seen in the gallbladder lumen, but no discrete gallstones are visualized. Diffuse gallbladder wall thickening is seen measuring up to 9 mm, but no sonographic Murphy sign noted by the sonographer, and no evidence of pericholecystic fluid. Common bile duct: Diameter: Mildly dilated measuring 10 mm in diameter. No  definite intrahepatic ductal dilatation seen. Liver: Diffusely increased parenchymal echogenicity consistent with steatosis. No focal liver lesions identified. Small amount of perihepatic  fluid seen. Portal vein is patent on color Doppler imaging with normal direction of blood flow towards the liver. Other: None. IMPRESSION: Markedly dilated gallbladder, with echogenic sludge but no definite gallstones. Diffuse gallbladder wall thickening, but no sonographic Murphy sign or pericholecystic fluid. These findings are still suspicious for acute cholecystitis. Mild dilatation of common bile duct measuring 10 mm. Hepatic steatosis, and small amount of perihepatic fluid. Electronically Signed   By: Norleen [name] Kil M.D.   On: 02/17/2024 09:28   CT Angio Chest/Abd/Pel for Dissection W and/or W/WO Result Date: 02/17/2024 CLINICAL DATA:  Chest pain and hypotension. Clinical concern for aortic dissection. EXAM: CT ANGIOGRAPHY CHEST, ABDOMEN AND PELVIS TECHNIQUE: Non-contrast CT of the chest was initially obtained. Multidetector CT imaging through the chest, abdomen and pelvis was performed using the standard protocol during bolus administration of intravenous contrast. Multiplanar reconstructed images and MIPs were obtained and reviewed to evaluate the vascular anatomy. RADIATION DOSE REDUCTION: This exam was performed according to the departmental dose-optimization program which includes automated exposure control, adjustment of the mA and/or kV according to patient size and/or use of iterative reconstruction technique. CONTRAST:  OMNIPAQUE  IOHEXOL  350 MG/ML SOLN COMPARISON:  Abdomen pelvis CT 12/28/2023 FINDINGS: CTA CHEST FINDINGS Cardiovascular: Pre contrast imaging shows no hyperdense crescent in the wall of the thoracic aorta to suggest the presence of an acute intramural hematoma. Imaging after IV contrast administration shows no thoracic aortic aneurysm. There is no dissection of the thoracic aorta. Enlargement of  the pulmonary outflow tract/main pulmonary arteries suggests pulmonary arterial hypertension. No CT evidence for acute pulmonary embolus Mediastinum/Nodes: No mediastinal lymphadenopathy. There is no hilar lymphadenopathy. The esophagus has normal imaging features. There is no axillary lymphadenopathy. Lungs/Pleura: Dependent ground-glass opacity in both lungs is likely atelectatic. No dense focal airspace consolidation. No pulmonary edema or substantial pleural effusion. Musculoskeletal: Multiple old left rib fractures evident. Review of the MIP images confirms the above findings. CTA ABDOMEN AND PELVIS FINDINGS VASCULAR Aorta: Normal caliber aorta without aneurysm, dissection, vasculitis or significant stenosis. Celiac: Patent without evidence of aneurysm, dissection, vasculitis or significant stenosis. SMA: Patent without evidence of aneurysm, dissection, vasculitis or significant stenosis. Renals: Both renal arteries are patent without evidence of aneurysm, dissection, vasculitis, fibromuscular dysplasia or significant stenosis. IMA: Patent without evidence of aneurysm, dissection, vasculitis or significant stenosis. Inflow: Patent without evidence of aneurysm, dissection, vasculitis or significant stenosis. Veins: No obvious venous abnormality within the limitations of this arterial phase study. Review of the MIP images confirms the above findings. NON-VASCULAR Hepatobiliary: Mildly heterogeneous liver perfusion on arterial phase imaging. Periportal edema evident. Gallbladder is massively dilated and progressive since prior measuring on the order of 16.6 x 7.0 cm today compared to 12.1 x 3.7 cm previously. No definite gallstones no perceptible gallbladder wall thickening or pericholecystic fluid. Common bile duct in the head of the pancreas measures up to 11 mm diameter increased from 7 mm previously. Pancreas: No focal mass lesion. No dilatation of the main duct. No intraparenchymal cyst. No peripancreatic  edema. Spleen: No splenomegaly. No suspicious focal mass lesion. Adrenals/Urinary Tract: No adrenal nodule or mass. Kidneys unremarkable. No evidence for hydroureter. The urinary bladder appears normal for the degree of distention. Stomach/Bowel: Surgical changes noted in the stomach. 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 Multiple small bowel anastomoses are identified in the abdomen pelvis. Neither the terminal ileum nor the appendix are discernible. Patient has a patulous, markedly elongated/redundant colon.  Although there is diffuse generalized distention of small bowel and colon, appearance is not in a frankly obstructive pattern and the colon is diffusely patulous filled with gas and stool from the cecum to the rectum. No bowel wall thickening or pneumatosis. Mild perirectal edema noted on a background of diffuse body wall edema. Lymphatic: There is no gastrohepatic or hepatoduodenal ligament lymphadenopathy. No retroperitoneal or mesenteric lymphadenopathy. No pelvic sidewall lymphadenopathy. Reproductive: No adnexal mass. Other: No substantial intraperitoneal free fluid. Musculoskeletal: Diffuse body wall edema evident. No worrisome lytic or sclerotic osseous abnormality. Review of the MIP images confirms the above findings. IMPRESSION: 1. No evidence for thoracoabdominal aortic aneurysm or dissection. 2. Enlargement of the pulmonary outflow tract/main pulmonary arteries suggests pulmonary arterial hypertension. 3. Massively dilated gallbladder, progressive since prior. No definite gallstones or perceptible gallbladder wall thickening. Common bile duct in the head of the pancreas measures up to 11 mm diameter increased from 7 mm previously. Correlation with liver function test recommended. Right upper quadrant ultrasound may prove helpful to further evaluate. 4. Diffuse generalized distention of small bowel and colon, not  in a frankly obstructive pattern. No bowel wall thickening or pneumatosis. Imaging features may be related to diffuse ileus. 5. Mild perirectal edema, nonspecific, on a background of diffuse body wall edema. Electronically Signed   By: Camellia Candle M.D.   On: 02/17/2024 05:53    "

## 2024-02-18 NOTE — Care Management (Signed)
 SDOH resources added to AVS

## 2024-02-18 NOTE — Discharge Instructions (Addendum)
 "  [employer] -[facility]elessness Coordinated Entry Program. If you are experiencing homelessness in [place], [place] , your first point of contact should be Pensions Consultant. You can reach Coordinated Entry by calling [phone] or by emailing [email or web address].  Community access points: [employer] [phone] [address], [place]) every Tuesday from 9am-10am. [facility] ([address]. [address] [place]) every Wednesday from 8am-9am.   -The [employer] 867-856-2724) offers several services to local families, as funding allows. The Emergency Assistance Program (EAP), which they administer, provides household goods, free food, clothing, and financial aid to people in need in the [place]  area. The EAP program does have some qualification, and counselors will interview clients for financial assistance by written referral only. Referrals need to be made by the Department of Social Services or by other EAP approved human services agencies or charities in the area.  -[facility] of [employer], which can be reached at [phone], offers emergency assistance programs for those in need of help, such as food, rent assistance, a soup kitchen, shelter, and clothing. They are based in [facility] [place]  but provide a number of services to those that qualify for assistance.   [facility] Department of Social Services may be able to offer temporary financial assistance and cash grants for paying rent and utilities, Help may be provided for local county residents who may be experiencing personal crisis when other resources, including government programs, are not available. Call [phone]  -[facility] [employer] is a [employer] agency, The organization can offer emergency assistance for paying rent, caremark rx, utilities, food,  household products and furniture. They offer extensive emergency and transitional housing for families, children and single women, and also run a Boy's and [employer]. Thrift Shops, Secondary School Teacher, and other aid offered too. [address] [place], [place]  [address], [phone]  -[place] Low Income Energy Assistance Program -- This is offered for [facility] families. The federal government created [employer] Program provides a one-time cash grant payment to help eligible low-income families pay their electric and heating bills. [address] [place], [place]  [address], [phone]  -[place] Emergency Assistance -- A program offers emergency utility and rent funds for greater [place] area residents. The program can also provide counseling and referrals to charities and government programs. Also provides food and a free meal program that serves lunch Mondays - Saturdays and dinner seven days per week to individuals in the community. [address], [place], [place]  [address], [phone]  -[employer] - Offers affordable apartment and housing communities across      [place] and [place]. The low income and seniors can access public housing, rental assistance to qualified applicants, and apply for the section 8 rent subsidy program. Other programs include Chiropractor and Engineer, Maintenance. [address] [place], [place]  [address], dial [phone].  -The [facility] provides transitional housing to veterans and the disabled. Clients will also access other services too, including assistance in applying for Disability, life skills classes, case management, and assistance in finding permanent housing. [address] [place], [place]  [address], call [phone]  -[place] Transitional Housing through [employer] is for  people who were just evicted or that are formerly homeless. The non-profit will also help then gain self-sufficiency, find a home or apartment  to live in, and also provides information on rent assistance when needed. Phone [phone]  -The [facility] helps low income, elderly, or disabled residents in seven counties in the [place] ([place], [place], [place], [place], [place], [place], [place], and [place]) save energy and reduce their utility bills by improving energy efficiency. Phone [phone].  -[employer] is located in the [place] [facility] in the [employer], [address], [place], [place] [address]. Parking is in the rear of the building. Phone: [phone]   General Email: info@gsohc .org  [facility] provides free housing counseling assistance in locating affordable rental housing or housing with support services for families and individuals in crisis and the chronically homeless. We provide potential resources for other housing needs like utilities. Our trained counselors also work with clients on budgeting and financial literacy in effort to empower them to take control of their financial situations. [employer] collaborates with homeless service providers and other stakeholders as part of the [employer] COC (Continuum of Care). The (COC) is a regional/local planning body that coordinates housing and services funding for homeless families and individuals. The role of [facility] in the COC is through housing counseling to work with people we serve on diversion strategies for those that are at imminent risk of becoming homeless. We also work with the Coordinated Assessment/Entry Specialist who attempts to find temporary solutions and/or connects the people to Housing First, Rapid Re-housing or transitional housing programs. Our Homelessness Prevention Housing Counselors meet  with clients on business days (Monday-Fridays, except scheduled holidays) from 8:30 am to 4:30 pm.  Legal assistance for evictions, foreclosure, and more -If you need free legal advice on civil issues, such as foreclosures, evictions, electronics engineer, government programs, domestic issues and more, Armed Forces Operational Officer Aid of [place]  [facility]) is a associate professor firm that provides free legal services and counsel to lower income people, seniors, disabled, and others, The goal is to ensure everyone has access to justice and fair representation. Call them at [phone].  [facility] for Housing and Community Studies can provide info about obtaining legal assistance with evictions. Phone [phone]. Data Processing Manager  The [employer]. offers job and dispensing optician. Resources are focused on helping students obtain the skills and experiences that are necessary to compete in today's challenging and tight job market. The non-profit faith-based community action agency offers internship trainings as well as classroom instruction. Classes are tailored to meet the needs of people in the [facility] region. [place], [place] [address], [phone] Foreclosure Prevention/Debt Services Family Services of the [employer] Credit Counseling Service inludes debt and foreclosure prevention programs for local families. This includes money management, financial advice, budget review and development of a written action plan with a pensions consultant to help solve specific individual financial problems. In addition, housing and mortgage counselors can also provide pre- and post-purchase homeownership counseling, default resolution counseling (to prevent foreclosure) and reverse mortgage counseling. A Debt Management Program allows people and families with a high level of credit card or medical debt to consolidate and repay consumer debt and loans to  creditors and rebuild positive credit ratings and scores. Contact ([phone]) D7650557. Community Clinics in [place] -Health Department [facility]: [address] [place], [place], [address]. [phone].  -Health Department [place] [facility]: [address], [place], [address]. [phone].  -[facility] offers medical care through a group of doctors, pharmacies  and other healthcare related agencies that offer services for low income, uninsured adults in [place]. Also offers adult Dental care and assistance with applying for an [employer]. Call [phone].   [facility]. This center provides low-cost health care to those without health insurance. Services offered include an onsite pharmacy. Phone [phone]. [address], [place].  -Medication Assistance Program serves as a link between pharmaceutical companies and patients to provide low cost or free prescription medications. This service is available for residents who meet certain income restrictions and have no insurance coverage. PLEASE CALL [phone] [place]) OR [phone] ([place])  -One Step Further: Materials Engineer, The [employer] Support & Nutrition Program, [place]. Call [phone]/ [phone].  Food Emergency Planning/management Officer -[facility]-Food Bank: [address], [place] [address]. Phone [phone]  -[facility] Food Pant[facility]: [address], [place], [place] [address]. [phone].  -[place] Food Finder: https://findfood.bargaincontractor.si  FLEEING VI[place]ENCE  -Family Services of the [place]- 24/7 Crisis line [phone]) -[facility] Family Justice Centers: (430) [phone]) 641-SAFE ([phone])  Transportation  -[facility] for an application. No fee for people over the age of 34. P: [phone]. Address: [address], [place], [place] [address]  -Senior Wheels Transportation-Age 88 and over. Limit of 1 ride per week for ambulatory participants. Limit 1 ride per month for non-ambulatory participants needing wheelchair transportation. Contact: ([phone]) [phone] ([place]), [phone] [facility]).  -Access [place]: Available for people with disabilities who are unable to use fixed-route bus service. Fee applies. Contact: [phone] (For application requests)/(336) [phone] (Customer service)/(336) [phone] (I-ride reservation line)/(336) [phone] (Access [place] reservation line)   Building Services Engineer.org  Homeless Day Center  -[facility])   Day Center: M-F 8a-3p [address]  [place], [place] [address] [phone] Services include: laundry, barbering, support groups, case management, phone & computer access, showers, AA/NA mtgs, mental health/substance abuse nurse, job skills class, disability information, VA assistance, spiritual classes, etc.        HOMELESS SHELTERS [employer] Shelter at [employer]- Call [phone] ext. [phone] or ext. 336. Located at [address], [place], [place] [address]  [facility] Mens Shelter- Call [phone]. Located at [address] [place] [address] [place] [address].  [facility]- [employer]. Call [phone]. Office located at [address], [place] [address].  [facility] Family Housing through [place] [phone].  [facility]- Call [phone]. Located at [address] [place], [place], [place] [address].  [facility] at the Inn-For Pregnant mothers. Call [phone]. Located at [address] [place], [address].  [facility]-For men in [place]. Call [phone].  Home of [employer] for [employer] [phone]. Office located at [address] [place], [address].  [employer] be agreeable to help with chores. Call [phone] ext. [phone].  Men's: [address] [place], [place] [address]. Women's: [facility]  [address] [place], [place] [address]   Crisis Services [facility] Mobile Crisis Management- [phone]  [facility] [address] [place], [place] [address]. Phone: [phone]   *[place] 2-1-1 is another useful way to locate resources in the community. Visit shedsizes.ch to find service information online. If you need additional assistance, 2-1-1 Referral Specialists are available 24 hours a day, every day by dialing 2-1-1 or  [phone] from any phone. The call is free, confidential, and available in any language. "
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
## 2024-02-18 NOTE — Progress Notes (Signed)
 Informed patient's nurse that no PICC today, possible tomorrow. Recommended if need central line, better to put in the internal jugular CVC. HS [employer]

## 2024-02-19 ENCOUNTER — Inpatient Hospital Stay (HOSPITAL_COMMUNITY)

## 2024-02-19 ENCOUNTER — Other Ambulatory Visit (HOSPITAL_COMMUNITY): Payer: Self-pay

## 2024-02-19 DIAGNOSIS — R079 Chest pain, unspecified: Secondary | ICD-10-CM

## 2024-02-19 DIAGNOSIS — E039 Hypothyroidism, unspecified: Secondary | ICD-10-CM | POA: Diagnosis not present

## 2024-02-19 DIAGNOSIS — R7989 Other specified abnormal findings of blood chemistry: Secondary | ICD-10-CM | POA: Diagnosis not present

## 2024-02-19 DIAGNOSIS — R6520 Severe sepsis without septic shock: Secondary | ICD-10-CM

## 2024-02-19 DIAGNOSIS — E8721 Acute metabolic acidosis: Secondary | ICD-10-CM | POA: Diagnosis not present

## 2024-02-19 DIAGNOSIS — A419 Sepsis, unspecified organism: Secondary | ICD-10-CM | POA: Diagnosis not present

## 2024-02-19 DIAGNOSIS — E876 Hypokalemia: Secondary | ICD-10-CM | POA: Diagnosis not present

## 2024-02-19 DIAGNOSIS — D696 Thrombocytopenia, unspecified: Secondary | ICD-10-CM | POA: Diagnosis not present

## 2024-02-19 LAB — PHOSPHORUS: Phosphorus: 1.8 mg/dL — ABNORMAL LOW (ref 2.5–4.6)

## 2024-02-19 LAB — ECHOCARDIOGRAM COMPLETE
AR max vel: 2.57 cm2
AV Area VTI: 2.41 cm2
AV Area mean vel: 2.13 cm2
AV Mean grad: 5 mmHg
AV Peak grad: 7.5 mmHg
Ao pk vel: 1.37 m/s
Area-P 1/2: 3.61 cm2
Calc EF: 60.3 %
Height: 67 in
S' Lateral: 3.4 cm
Single Plane A2C EF: 63.5 %
Single Plane A4C EF: 56.3 %
Weight: 2299.84 [oz_av]

## 2024-02-19 LAB — COMPREHENSIVE METABOLIC PANEL WITH GFR
ALT: 292 U/L — ABNORMAL HIGH (ref 0–44)
AST: 290 U/L — ABNORMAL HIGH (ref 15–41)
Albumin: 2.7 g/dL — ABNORMAL LOW (ref 3.5–5.0)
Alkaline Phosphatase: 197 U/L — ABNORMAL HIGH (ref 38–126)
Anion gap: 9 (ref 5–15)
BUN: 9 mg/dL (ref 6–20)
CO2: 28 mmol/L (ref 22–32)
Calcium: 7.2 mg/dL — ABNORMAL LOW (ref 8.9–10.3)
Chloride: 105 mmol/L (ref 98–111)
Creatinine, Ser: 0.51 mg/dL (ref 0.44–1.00)
GFR, Estimated: >60 mL/min
Glucose, Bld: 100 mg/dL — ABNORMAL HIGH (ref 70–99)
Potassium: 2.8 mmol/L — ABNORMAL LOW (ref 3.5–5.1)
Sodium: 142 mmol/L (ref 135–145)
Total Bilirubin: 0.6 mg/dL (ref 0.0–1.2)
Total Protein: 4.7 g/dL — ABNORMAL LOW (ref 6.5–8.1)

## 2024-02-19 LAB — CBC
HCT: 26.7 % — ABNORMAL LOW (ref 36.0–46.0)
Hemoglobin: 9.3 g/dL — ABNORMAL LOW (ref 12.0–15.0)
MCH: 30.7 pg (ref 26.0–34.0)
MCHC: 34.8 g/dL (ref 30.0–36.0)
MCV: 88.1 fL (ref 80.0–100.0)
Platelets: 71 10*3/uL — ABNORMAL LOW (ref 150–400)
RBC: 3.03 MIL/uL — ABNORMAL LOW (ref 3.87–5.11)
RDW: 16.8 % — ABNORMAL HIGH (ref 11.5–15.5)
WBC: 14 10*3/uL — ABNORMAL HIGH (ref 4.0–10.5)
nRBC: 0 % (ref 0.0–0.2)

## 2024-02-19 LAB — CULTURE, BLOOD (ROUTINE X 2)

## 2024-02-19 LAB — GLUCOSE, CAPILLARY
Glucose-Capillary: 67 mg/dL — ABNORMAL LOW (ref 70–99)
Glucose-Capillary: 102 mg/dL — ABNORMAL HIGH (ref 70–99)
Glucose-Capillary: 98 mg/dL (ref 70–99)
Glucose-Capillary: 117 mg/dL — ABNORMAL HIGH (ref 70–99)

## 2024-02-19 LAB — MAGNESIUM: Magnesium: 1.8 mg/dL (ref 1.7–2.4)

## 2024-02-19 LAB — PROTIME-INR
INR: 1.7 — ABNORMAL HIGH (ref 0.8–1.2)
Prothrombin Time: 20.9 s — ABNORMAL HIGH (ref 11.4–15.2)

## 2024-02-19 MED ORDER — PAROXETINE HCL 10 MG PO TABS
10.0000 mg | ORAL_TABLET | Freq: Every day | ORAL | Status: DC
  Administered 2024-02-19 – 2024-02-20 (×2): 10 mg via ORAL
  Filled 2024-02-19 (×2): qty 1

## 2024-02-19 MED ORDER — AMPHETAMINE-DEXTROAMPHETAMINE 10 MG PO TABS
30.0000 mg | ORAL_TABLET | Freq: Two times a day (BID) | ORAL | Status: DC
  Administered 2024-02-19 – 2024-02-20 (×3): 30 mg via ORAL
  Filled 2024-02-19 (×3): qty 3

## 2024-02-19 MED ORDER — METRONIDAZOLE 500 MG/100ML IV SOLN
500.0000 mg | Freq: Two times a day (BID) | INTRAVENOUS | Status: DC
  Filled 2024-02-19: qty 100

## 2024-02-19 MED ORDER — TRAZODONE HCL 50 MG PO TABS
50.0000 mg | ORAL_TABLET | Freq: Every day | ORAL | Status: DC
  Administered 2024-02-19: 50 mg via ORAL
  Filled 2024-02-19: qty 1

## 2024-02-19 MED ORDER — FERROUS SULFATE 325 (65 FE) MG PO TABS
325.0000 mg | ORAL_TABLET | Freq: Every day | ORAL | Status: DC
  Administered 2024-02-20: 325 mg via ORAL
  Filled 2024-02-19: qty 1

## 2024-02-19 MED ORDER — LEVOTHYROXINE SODIUM 75 MCG PO TABS
200.0000 ug | ORAL_TABLET | Freq: Every day | ORAL | Status: DC
  Administered 2024-02-19 – 2024-02-20 (×2): 200 ug via ORAL
  Filled 2024-02-19: qty 2
  Filled 2024-02-19: qty 1

## 2024-02-19 MED ORDER — FOLIC ACID 1 MG PO TABS
1.0000 mg | ORAL_TABLET | Freq: Every day | ORAL | Status: DC
  Administered 2024-02-19 – 2024-02-20 (×2): 1 mg via ORAL
  Filled 2024-02-19 (×2): qty 1

## 2024-02-19 MED ORDER — ADULT MULTIVITAMIN W/MINERALS CH
1.0000 | ORAL_TABLET | Freq: Every day | ORAL | Status: DC
  Administered 2024-02-19: 1 via ORAL
  Filled 2024-02-19: qty 1

## 2024-02-19 MED ORDER — MONTELUKAST SODIUM 10 MG PO TABS
10.0000 mg | ORAL_TABLET | Freq: Every day | ORAL | Status: DC
  Administered 2024-02-19: 10 mg via ORAL
  Filled 2024-02-19: qty 1

## 2024-02-19 MED ORDER — FLUTICASONE PROPIONATE 50 MCG/ACT NA SUSP: 2.0000 | Freq: Every day | NASAL | Status: DC | PRN

## 2024-02-19 MED ORDER — HYDROXYZINE HCL 25 MG PO TABS: 25.0000 mg | ORAL_TABLET | Freq: Four times a day (QID) | ORAL | Status: DC | PRN

## 2024-02-19 MED ORDER — PANTOPRAZOLE SODIUM 40 MG PO TBEC
40.0000 mg | DELAYED_RELEASE_TABLET | Freq: Every day | ORAL | Status: DC
  Administered 2024-02-19 – 2024-02-20 (×2): 40 mg via ORAL
  Filled 2024-02-19 (×2): qty 1

## 2024-02-19 MED ORDER — MELATONIN 3 MG PO TABS
3.0000 mg | ORAL_TABLET | Freq: Every day | ORAL | Status: DC
  Administered 2024-02-19: 3 mg via ORAL
  Filled 2024-02-19: qty 1

## 2024-02-19 MED ORDER — ZINC SULFATE 220 (50 ZN) MG PO CAPS
220.0000 mg | ORAL_CAPSULE | Freq: Every day | ORAL | Status: DC
  Administered 2024-02-19 – 2024-02-20 (×2): 220 mg via ORAL
  Filled 2024-02-19 (×2): qty 1

## 2024-02-19 MED ORDER — ADULT MULTIVITAMIN W/MINERALS CH
1.0000 | ORAL_TABLET | Freq: Two times a day (BID) | ORAL | Status: DC
  Administered 2024-02-19 – 2024-02-20 (×2): 1 via ORAL
  Filled 2024-02-19 (×2): qty 1

## 2024-02-19 MED ORDER — CALCIUM GLUCONATE-NACL 2-0.675 GM/100ML-% IV SOLN
2.0000 g | Freq: Once | INTRAVENOUS | Status: AC
  Administered 2024-02-19: 2000 mg via INTRAVENOUS
  Filled 2024-02-19: qty 100

## 2024-02-19 MED ORDER — VITAMIN D (ERGOCALCIFEROL) 1.25 MG (50000 UNIT) PO CAPS
50000.0000 [IU] | ORAL_CAPSULE | ORAL | Status: DC
  Administered 2024-02-19: 50000 [IU] via ORAL
  Filled 2024-02-19: qty 1

## 2024-02-19 MED ORDER — ENSURE PLUS HIGH PROTEIN PO LIQD
237.0000 mL | Freq: Two times a day (BID) | ORAL | Status: DC
  Administered 2024-02-19 – 2024-02-20 (×3): 237 mL via ORAL

## 2024-02-19 MED ORDER — POTASSIUM PHOSPHATES 15 MMOLE/5ML IV SOLN
30.0000 mmol | Freq: Once | INTRAVENOUS | Status: AC
  Administered 2024-02-19: 30 mmol via INTRAVENOUS
  Filled 2024-02-19: qty 10

## 2024-02-19 MED ORDER — MAGNESIUM SULFATE 2 GM/50ML IV SOLN
2.0000 g | Freq: Once | INTRAVENOUS | Status: AC
  Administered 2024-02-19: 2 g via INTRAVENOUS
  Filled 2024-02-19: qty 50

## 2024-02-19 MED ORDER — CALCIUM CARBONATE ANTACID 500 MG PO CHEW
400.0000 mg | CHEWABLE_TABLET | Freq: Three times a day (TID) | ORAL | Status: DC
  Administered 2024-02-19 – 2024-02-20 (×3): 400 mg via ORAL
  Filled 2024-02-19 (×3): qty 2

## 2024-02-19 MED ORDER — POTASSIUM CHLORIDE CRYS ER 20 MEQ PO TBCR
40.0000 meq | EXTENDED_RELEASE_TABLET | Freq: Once | ORAL | Status: AC
  Administered 2024-02-19: 40 meq via ORAL
  Filled 2024-02-19: qty 2

## 2024-02-19 NOTE — Progress Notes (Signed)
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 Initial workup in ED was assuring against cardiac event and dissection. Imaging did show a significantly distended gallbladder -- incr from prior imaging -- as well as incr CBD diameter, and non-obstructive bowel distension. RUQ US  w distended gb and diffuse wall thickening c/f acute chole.   Labs w hypoK hypomag, elevated LFTs, metabolic acidosis Normal WBC, hypothermic.  Rcvd flagyl  cefepime .     CCS consulted   She has had soft Bps -- MAPs generally > 65 but borderline-- and a rising LA in the ED.  PCCM called for admission in this setting   On my eval she denies current chest pain.  No abd pain, no n/v/d/c.  Eating/drinking a little less lately but not a significant change.   Pertinent  Medical History  Bariatric surgery, duodenal switch Mesenteric ischemia, bowel resection Ptsd mdd gad bipolar   Significant Hospital Events: Including procedures, antibiotic start and stop dates in addition to other pertinent events   02/17/24 to ED w chest pain. C/f acute chole. 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  Objective    Blood pressure 92/65, pulse 74, temperature (!) 97.1 F (36.2 C), temperature source Oral, resp. rate (!) 7, height 5' 7 (1.702 m), weight 65.2 kg, last menstrual period 03/02/2021, SpO2 93%.         Intake/Output Summary (Last 24 hours) at 02/19/2024 0805 Last data filed at 02/19/2024 0600 Gross per 24 hour  Intake 3742.07 ml  Output 150 ml  Net 3592.07 ml   Filed Weights   02/17/24 1445 02/18/24 0450 02/19/24 0400  Weight: 64.4 kg 66.9 kg 65.2 kg    Examination: No distress Missing left eye Moves to command Globally weak Aox3 Abd soft Ext warm  Labs noted  Resolved problem list   Assessment and Plan  Working diagnosis of severe sepsis from E coli bacteremia related to acute on chronic cholecystitis with following associated issues: Shock liver: on empiric NAC; given rapid rebound no role to continue Metabolic encephalopathy, resolved Pancytopenia Medication compliance issues- secondary to financial constraints Hypothyroidism- TSH way up again related to compliance Thrombocytopenia- associated with shock liver and sepsis, trend Hypo-mg, phos, K- being repleted, related to issues from prior bariatric surgery and compliance Metabolic acidosis- resolved Deconditioning  DC NAC Ceftriaxone  x 7 days F/u echo F/u remaining vitamin labs RD and TOC consult regarding home meds affordibility and to address  Home meds resumed Needs close PCP f/u and med compliance or else will be readmitted Check iron studies PT/OT Advance diet per CCS; no plans for surgical intervention Okay for floor, appreciate TRH taking over 02/20/24   Rolan Sharps MD PCCM "
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
## 2024-02-19 NOTE — Progress Notes (Addendum)
" °   02/19/24 1519  Benefit Eligibility  Co-Pay $1.30 for 30 tabs.  Medication/Dose Certavite mulitivitamin   Above is a multivitamin that can be written as a monthly prescription. "

## 2024-02-19 NOTE — Evaluation (Addendum)
 Occupational Therapy Evaluation Patient Details Name: Renee [name] MRN: [id number] DOB: 12-31-1975 Today's Date: 02/19/2024   History of Present Illness   Pt is a 49 y/o female presenting with chest pain radiating to her back. Imaging showed significantly distended gallbladder, nonobstructive bowel distention and acute chole. Admitted for treatment of septic shock. 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 Pt reports being typically Independent with ADLs, IADLs and mobility though no family present to confirm. Pt presents now with minor deficits in standing balance, cognition and overall endurance. Pt eager to mobilize- able to manage ADLs with no more than Supervision and hallway mobility with CGA without AD. Pt quick with movements and does benefit from cues for pacing and line safety. Anticipate no OT needs upon DC but would recommend initial assist with med mgmt upon DC. Pt reports she needs to discharge home by this Thursday due to upcoming family events.   BP 117/92 post activity     If plan is discharge home, recommend the following:   Direct supervision/assist for medications management;Direct supervision/assist for financial management;Assist for transportation     Functional Status Assessment   Patient has had a recent decline in their functional status and demonstrates the ability to make significant improvements in function in a reasonable and predictable amount of time.     Equipment Recommendations   None recommended by OT     Recommendations for Other Services         Precautions/Restrictions   Precautions Precautions: Fall Restrictions Weight Bearing Restrictions Per Provider Order: No     Mobility Bed Mobility Overal bed mobility: Needs Assistance Bed  Mobility: Supine to Sit, Sit to Supine     Supine to sit: Supervision Sit to supine: Supervision        Transfers Overall transfer level: Needs assistance Equipment used: None Transfers: Sit to/from Stand Sit to Stand: Contact guard assist           General transfer comment: from bedside and regular toilet      Balance Overall balance assessment: Needs assistance Sitting-balance support: No upper extremity supported, Feet supported Sitting balance-Leahy Scale: Fair     Standing balance support: No upper extremity supported, During functional activity Standing balance-Leahy Scale: Fair Standing balance comment: Fair+                           ADL either performed or assessed with clinical judgement   ADL Overall ADL's : Needs assistance/impaired Eating/Feeding: Independent   Grooming: Supervision/safety;Standing   Upper Body Bathing: Supervision/ safety;Set up   Lower Body Bathing: Supervison/ safety;Sit to/from stand;Sitting/lateral leans   Upper Body Dressing : Set up;Supervision/safety   Lower Body Dressing: Set up;Supervision/safety;Sit to/from stand;Sitting/lateral leans Lower Body Dressing Details (indicate cue type and reason): pt able to don socks, underwear and jeans at bedside with minor cues for line safety Toilet Transfer: Contact guard assist;Supervision/safety;Ambulation;Regular Teacher, Adult Education Details (indicate cue type and reason): no AD, cues for line safety Toileting- Clothing Manipulation and Hygiene: Supervision/safety;Set up       Functional mobility during ADLs: Contact guard assist General ADL Comments: able to mobilize around unit without AD, minor sway though no external assist needed to correct balance     Vision Baseline Vision/History: 2 Legally blind Ability to See in Adequate Light:  1 Impaired Patient Visual Report: Other (comment) (L eye blindness) Vision Assessment?: Vision impaired- to be further tested in  functional context Additional Comments: 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     Perception         Praxis         Pertinent Vitals/Pain Pain Assessment Pain Assessment: No/denies pain     Extremity/Trunk Assessment Upper Extremity Assessment Upper Extremity Assessment: Overall WFL for tasks assessed;Right hand dominant   Lower Extremity Assessment Lower Extremity Assessment: Defer to PT evaluation   Cervical / Trunk Assessment Cervical / Trunk Assessment: Normal   Communication Communication Communication: No apparent difficulties   Cognition Arousal: Alert Behavior During Therapy: Impulsive, WFL for tasks assessed/performed Cognition: Cognition impaired       Memory impairment (select all impairments): Short-term memory Attention impairment (select first level of impairment): Sustained attention, Selective attention Executive functioning impairment (select all impairments): Reasoning, Problem solving OT - Cognition Comments: pt pleasant, impulsive with movements at times and requires redirection. Questionable memory deficits as pt asking to put on a shirt despite OT education on unable to do so due to multiple lines earlier in session. Pt reports plan to DC home on Thursday d/t SO's court date on Friday                 Following commands: Impaired Following commands impaired: Only follows one step commands consistently     Cueing  General Comments   Cueing Techniques: Verbal cues;Gestural cues      Exercises     Shoulder Instructions      Home Living Family/patient expects to be discharged to:: Private residence Living Arrangements: Spouse/significant other;Other relatives;Non-relatives/Friends (mother in law and another roommate) Available Help at Discharge: Family Type of Home: House Home Access: Stairs to enter Secretary/administrator of Steps: 2 Entrance Stairs-Rails: None Home Layout: One level     Bathroom Shower/Tub: Contractor: Standard     Home Equipment: BSC/3in1;Cane - single point;Wheelchair - manual          Prior Functioning/Environment Prior Level of Function : Needs assist             Mobility Comments: no AD for mobility typically ADLs Comments: reports Independence with ADLs, IADLs, driving and grocery shopping    OT Problem List: Decreased strength;Decreased activity tolerance;Impaired balance (sitting and/or standing);Decreased cognition;Decreased safety awareness   OT Treatment/Interventions: Self-care/ADL training;Therapeutic exercise;Energy conservation;DME and/or AE instruction;Therapeutic activities;Patient/family education;Balance training      OT Goals(Current goals can be found in the care plan section)   Acute Rehab OT Goals Patient Stated Goal: get home by Thursday at the latest OT Goal Formulation: With patient Time For Goal Achievement: 03/04/24 Potential to Achieve Goals: Good ADL Goals Pt Will Transfer to Toilet: ambulating;Independently Pt/caregiver will Perform Home Exercise Program: Increased strength;Both right and left upper extremity;With theraband;With Supervision;With written HEP provided Additional ADL Goal #1: Pt to demo ability to gather ADL/IADL items MOD I without LOB or safety concerns Additional ADL Goal #2: Pt to participate in further functional cognitive assessments (pill box, etc)   OT Frequency:  Min 2X/week    Co-evaluation              AM-PAC OT 6 Clicks Daily Activity     Outcome Measure Help from another person eating meals?: None Help from another person taking care of personal grooming?: A Little Help from another person toileting, which includes using toliet, bedpan, or urinal?: A Little Help  from another person bathing (including washing, rinsing, drying)?: A Little Help from another person to put on and taking off regular upper body clothing?: A Little Help from another person to put on and taking off regular lower body  clothing?: A Little 6 Click Score: 19   End of Session Equipment Utilized During Treatment: Gait belt Nurse Communication: Mobility status  Activity Tolerance: Patient tolerated treatment well Patient left: in bed;with call bell/phone within reach;with bed alarm set  OT Visit Diagnosis: Unsteadiness on feet (R26.81);Muscle weakness (generalized) (M62.81);Other symptoms and signs involving cognitive function                Time: 1351-1419 OT Time Calculation (min): 28 min Charges:  OT General Charges $OT Visit: 1 Visit OT Evaluation $OT Eval Moderate Complexity: 1 Mod OT Treatments $Self Care/Home Management : 8-22 mins  Mliss NOVAK, OTR/L Acute Rehab Services Office: [phone]   Mliss Fish 02/19/2024, 2:44 PM

## 2024-02-19 NOTE — Progress Notes (Signed)
 Initial Nutrition Assessment  DOCUMENTATION CODES:  Not applicable  INTERVENTION:  Advance diet as tolerated Encourage PO intake Ensure Plus High Protein po BID, each supplement provides 350 kcal and 20 grams of protein Increase MVI to BID and added calcium  carbonate TID for hx of bariatric surgery Continue replacements for Vitamin D , 50,000 units weekly x8 weeks Ordered replacement for iron deficiency, 65mg  daily Monitor pending labs and order replacement as needed:  Vitamin A , C, E, Zinc , Copper , Selenium , B6, thiamine    NUTRITION DIAGNOSIS:  Inadequate oral intake related to decreased appetite as evidenced by per patient/family report.  GOAL:  Patient will meet greater than or equal to 90% of their needs  MONITOR:  PO intake, Diet advancement, Labs, Supplement acceptance  REASON FOR ASSESSMENT:   Consult Assessment of nutrition requirement/status, Diet education (h/o duodenal switch; not taking supplements due to finances; severe vitamin derangements)  ASSESSMENT:  Pt with hx of HTN, HLD, hypothyroidism, GERD, multiple psychiatric disorders (ADD, anxiety and depression, PTSD), and history of bariatric surgery (gastric bypass later converted to a duodenal switch)  presented to ED with chest pain. Cardiac workup negative, but suggestive of acute cholecystitis and septic shock.  1/24 - presented to ED  RD working remotely, unable to call pt on room phone at this time.   Reviewed chart and noted that pt had recent admission at [facility] 11/1-11/17 and then was admitted to [facility] 11/17-11/24. Followed extensively by RD team during that time and found to have significant micronutrient deficiencies (Vitamins A, C, D, E, zinc , copper , selenium ). Replacements were ordered and pt was discharged on vitamins that were not completed during admission. Noted that pt previously cited financial concerns as her reason for not adhering to regimen at home. Reported to RD during rehab stay that she  didn't know insurance would pay for her supplements. Reported on admission that she has not been taking supplements at home again.    Some labs drawn by surgery team and are pending. Ordered the remaining labs to be accessed to see if deficiencies still present. Will follow-up in person for physical exam and to provide ongoing education to patient on the importance of adhering to MVI regimen.   Admit weight: 67 kg  Current weight: 65.2 kg    Intake/Output Summary (Last 24 hours) at 02/19/2024 1431 Last data filed at 02/19/2024 1400 Gross per 24 hour  Intake 3543.94 ml  Output --  Net 3543.94 ml  Net IO Since Admission: 10,369.46 mL [02/19/24 1431]  Drains/Lines: PICC, triple lumen  Average Meal Intake: 1/26: 100% intake x 1 recorded meals  Nutritionally Relevant Medications: Scheduled Meds:  folic acid   1 mg Oral Daily   levothyroxine   200 mcg Oral Q0600   multivitamin with minerals  1 tablet Oral Daily   pantoprazole   40 mg Oral Daily   potassium chloride   40 mEq Oral Once   Vitamin D    50,000 Units Oral Q7 days   zinc  sulfate   220 mg Oral Daily   Continuous Infusions:  cefTRIAXone  (ROCEPHIN )  IV Stopped (02/18/24 2201)   magnesium  sulfate bolus IVPB     metronidazole      potassium PHOSPHATE  IVPB (in mmol) 30 mmol (02/19/24 9347)   thiamine  (VITAMIN B1) injection Stopped (02/18/24 1541)   PRN Meds: polyethylene glycol, senna  Labs Reviewed: Potassium 2.8 Phosphorus 1.8 CBG ranges from 74-136 mg/dL over the last 24 hours  Micronutrient Profile Lactic Acid pending Copper  pending (last check 11/3, 29, low) Vitamin A  pending (last  check 11/3, 2.6, low) Vitamin B6 pending Vitamin D  <6.0, 1/25, replacement ordered Zinc  pending (last check 11/3, 20 low) Vitamin C  pending (last check 11/3, 0.3, low) Selenium  pending (last check 11/4, 52, low) Iron 21, 1/25, low  NUTRITION - FOCUSED PHYSICAL EXAM: Defer to in-person assessment  Diet Order:   Diet Order              Diet full liquid Fluid consistency: Thin  Diet effective now                   EDUCATION NEEDS:  Not appropriate for education at this time  Skin:  Skin Assessment: Reviewed RN Assessment  Last BM:  1/26 type 6  Height:  Ht Readings from Last 1 Encounters:  02/17/24 5' 7 (1.702 m)    Weight:  Wt Readings from Last 1 Encounters:  02/19/24 65.2 kg    Ideal Body Weight:  61.4 kg  BMI:  Body mass index is 22.51 kg/m.  Estimated Nutritional Needs:  Kcal:  1700-1900 kcal/d Protein:  90-105g/d Fluid:  >1.8L/d    Renee [name], RD, LDN, CNSC Registered Dietitian II Please reach out via secure chat

## 2024-02-19 NOTE — Progress Notes (Signed)
 [facility] ADULT ICU REPLACEMENT PROTOCOL   The patient does apply for the [facility] Adult ICU Electrolyte Replacment Protocol based on the criteria listed below:   1.Exclusion criteria: TCTS, ECMO, Dialysis, and Myasthenia Gravis patients 2. Is GFR >/= 30 ml/min? Yes.    Patient's GFR today is > 60 3. Is SCr </= 2? Yes.   Patient's SCr is 0.51 mg/dL 4. Did SCr increase >/= 0.5 in 24 hours? No. 5.Pt's weight >40kg  Yes.   6. Abnormal electrolyte(s): K= 2.8, Phos 1.8  7. Electrolytes replaced per protocol 8.  Call MD STAT for K+ </= 2.5, Phos </= 1, or Mag </= 1 Physician:  Dr Mallory Sheela Darner, Norleen Barters 02/19/2024 6:11 AM

## 2024-02-19 NOTE — Plan of Care (Signed)
" °  Problem: Education: Goal: Knowledge of General Education information will improve Description: Including pain rating scale, medication(s)/side effects and non-pharmacologic comfort measures Outcome: Progressing   Problem: Clinical Measurements: Goal: Ability to maintain clinical measurements within normal limits will improve Outcome: Progressing Goal: Will remain free from infection Outcome: Progressing Goal: Diagnostic test results will improve Outcome: Progressing Goal: Respiratory complications will improve Outcome: Progressing   Problem: Activity: Goal: Risk for activity intolerance will decrease Outcome: Progressing   Problem: Coping: Goal: Level of anxiety will decrease Outcome: Progressing   Problem: Elimination: Goal: Will not experience complications related to urinary retention Outcome: Progressing   Problem: Pain Managment: Goal: General experience of comfort will improve and/or be controlled Outcome: Progressing   Problem: Safety: Goal: Ability to remain free from injury will improve Outcome: Progressing   "

## 2024-02-20 ENCOUNTER — Other Ambulatory Visit (HOSPITAL_COMMUNITY): Payer: Self-pay

## 2024-02-20 DIAGNOSIS — R6521 Severe sepsis with septic shock: Secondary | ICD-10-CM | POA: Diagnosis not present

## 2024-02-20 DIAGNOSIS — A419 Sepsis, unspecified organism: Secondary | ICD-10-CM | POA: Diagnosis not present

## 2024-02-20 LAB — BASIC METABOLIC PANEL WITH GFR
Anion gap: 6 (ref 5–15)
BUN: 6 mg/dL (ref 6–20)
CO2: 31 mmol/L (ref 22–32)
Calcium: 7 mg/dL — ABNORMAL LOW (ref 8.9–10.3)
Chloride: 106 mmol/L (ref 98–111)
Creatinine, Ser: 0.43 mg/dL — ABNORMAL LOW (ref 0.44–1.00)
GFR, Estimated: >60 mL/min
Glucose, Bld: 77 mg/dL (ref 70–99)
Potassium: 2.9 mmol/L — ABNORMAL LOW (ref 3.5–5.1)
Sodium: 142 mmol/L (ref 135–145)

## 2024-02-20 LAB — IRON AND TIBC
Iron: 78 ug/dL (ref 28–170)
Saturation Ratios: 41 % — ABNORMAL HIGH (ref 10.4–31.8)
TIBC: 189 ug/dL — ABNORMAL LOW (ref 250–450)
UIBC: 111 ug/dL

## 2024-02-20 LAB — FERRITIN: Ferritin: 64 ng/mL (ref 11–307)

## 2024-02-20 LAB — CBC
HCT: 22.8 % — ABNORMAL LOW (ref 36.0–46.0)
Hemoglobin: 7.7 g/dL — ABNORMAL LOW (ref 12.0–15.0)
MCH: 30.8 pg (ref 26.0–34.0)
MCHC: 33.8 g/dL (ref 30.0–36.0)
MCV: 91.2 fL (ref 80.0–100.0)
Platelets: 45 10*3/uL — ABNORMAL LOW (ref 150–400)
RBC: 2.5 MIL/uL — ABNORMAL LOW (ref 3.87–5.11)
RDW: 17.2 % — ABNORMAL HIGH (ref 11.5–15.5)
WBC: 7 10*3/uL (ref 4.0–10.5)
nRBC: 0 % (ref 0.0–0.2)

## 2024-02-20 LAB — HEPATIC FUNCTION PANEL
ALT: 189 U/L — ABNORMAL HIGH (ref 0–44)
AST: 116 U/L — ABNORMAL HIGH (ref 15–41)
Albumin: 2.5 g/dL — ABNORMAL LOW (ref 3.5–5.0)
Alkaline Phosphatase: 177 U/L — ABNORMAL HIGH (ref 38–126)
Bilirubin, Direct: 0.2 mg/dL (ref 0.0–0.2)
Indirect Bilirubin: 0.2 mg/dL — ABNORMAL LOW (ref 0.3–0.9)
Total Bilirubin: 0.4 mg/dL (ref 0.0–1.2)
Total Protein: 4.3 g/dL — ABNORMAL LOW (ref 6.5–8.1)

## 2024-02-20 LAB — T3, FREE: T3, Free: 0.8 pg/mL — ABNORMAL LOW (ref 2.0–4.4)

## 2024-02-20 LAB — C-REACTIVE PROTEIN: CRP: 4.6 mg/dL — ABNORMAL HIGH

## 2024-02-20 LAB — MAGNESIUM: Magnesium: 1.9 mg/dL (ref 1.7–2.4)

## 2024-02-20 LAB — PHOSPHORUS: Phosphorus: 1.5 mg/dL — ABNORMAL LOW (ref 2.5–4.6)

## 2024-02-20 MED ORDER — CALCIUM CARBONATE ANTACID 500 MG PO CHEW
400.0000 mg | CHEWABLE_TABLET | Freq: Three times a day (TID) | ORAL | 2 refills | Status: AC
  Filled 2024-02-20: qty 180, 30d supply, fill #0

## 2024-02-20 MED ORDER — OMEPRAZOLE 40 MG PO CPDR
40.0000 mg | DELAYED_RELEASE_CAPSULE | Freq: Two times a day (BID) | ORAL | 2 refills | Status: AC
  Filled 2024-02-20: qty 60, 30d supply, fill #0

## 2024-02-20 MED ORDER — SODIUM CHLORIDE 0.9 % IV SOLN
4.0000 g | Freq: Once | INTRAVENOUS | Status: AC
  Administered 2024-02-20: 4 g via INTRAVENOUS
  Filled 2024-02-20: qty 40

## 2024-02-20 MED ORDER — MAGNESIUM OXIDE -MG SUPPLEMENT 400 (240 MG) MG PO TABS
400.0000 mg | ORAL_TABLET | Freq: Two times a day (BID) | ORAL | 2 refills | Status: AC
  Filled 2024-02-20: qty 60, 30d supply, fill #0

## 2024-02-20 MED ORDER — K PHOS MONO-SOD PHOS DI & MONO 155-852-130 MG PO TABS
500.0000 mg | ORAL_TABLET | Freq: Every day | ORAL | 0 refills | Status: AC
  Filled 2024-02-20: qty 20, 10d supply, fill #0

## 2024-02-20 MED ORDER — MAGNESIUM SULFATE 2 GM/50ML IV SOLN: 2.0000 g | Freq: Once | INTRAVENOUS | Status: DC

## 2024-02-20 MED ORDER — CEFAZOLIN SODIUM-DEXTROSE 2-4 GM/100ML-% IV SOLN: 2.0000 g | Freq: Three times a day (TID) | INTRAVENOUS | Status: DC

## 2024-02-20 MED ORDER — MONTELUKAST SODIUM 10 MG PO TABS
10.0000 mg | ORAL_TABLET | Freq: Every day | ORAL | 2 refills | Status: AC
  Filled 2024-02-20: qty 30, 30d supply, fill #0

## 2024-02-20 MED ORDER — THIAMINE HCL 100 MG PO TABS
100.0000 mg | ORAL_TABLET | Freq: Every day | ORAL | 0 refills | Status: AC
  Filled 2024-02-20: qty 100, 100d supply, fill #0

## 2024-02-20 MED ORDER — FOLIC ACID 1 MG PO TABS
1.0000 mg | ORAL_TABLET | Freq: Every day | ORAL | 2 refills | Status: AC
  Filled 2024-02-20: qty 90, 90d supply, fill #0

## 2024-02-20 MED ORDER — POTASSIUM CHLORIDE CRYS ER 20 MEQ PO TBCR
20.0000 meq | EXTENDED_RELEASE_TABLET | Freq: Every day | ORAL | 0 refills | Status: AC
  Filled 2024-02-20: qty 14, 14d supply, fill #0

## 2024-02-20 MED ORDER — HYDROXYZINE HCL 25 MG PO TABS
25.0000 mg | ORAL_TABLET | Freq: Four times a day (QID) | ORAL | 0 refills | Status: AC | PRN
  Filled 2024-02-20: qty 30, 8d supply, fill #0

## 2024-02-20 MED ORDER — CEFADROXIL 500 MG PO CAPS
500.0000 mg | ORAL_CAPSULE | Freq: Two times a day (BID) | ORAL | 0 refills | Status: AC
  Filled 2024-02-20: qty 10, 5d supply, fill #0

## 2024-02-20 MED ORDER — POLYETHYLENE GLYCOL 3350 17 GM/SCOOP PO POWD
17.0000 g | Freq: Every day | ORAL | 0 refills | Status: AC | PRN
  Filled 2024-02-20: qty 238, 14d supply, fill #0

## 2024-02-20 MED ORDER — FLUTICASONE PROPIONATE 50 MCG/ACT NA SUSP
2.0000 | Freq: Every day | NASAL | 1 refills | Status: AC | PRN
  Filled 2024-02-20: qty 16, 30d supply, fill #0

## 2024-02-20 MED ORDER — MAGNESIUM OXIDE -MG SUPPLEMENT 400 (240 MG) MG PO TABS
400.0000 mg | ORAL_TABLET | Freq: Two times a day (BID) | ORAL | Status: DC
  Administered 2024-02-20: 400 mg via ORAL
  Filled 2024-02-20: qty 1

## 2024-02-20 MED ORDER — MAGNESIUM OXIDE -MG SUPPLEMENT 400 (240 MG) MG PO TABS: 400.0000 mg | ORAL_TABLET | Freq: Two times a day (BID) | ORAL | Status: DC

## 2024-02-20 MED ORDER — TRAZODONE HCL 150 MG PO TABS
75.0000 mg | ORAL_TABLET | Freq: Every day | ORAL | 2 refills | Status: AC
  Filled 2024-02-20: qty 15, 30d supply, fill #0

## 2024-02-20 MED ORDER — VITAMIN D (ERGOCALCIFEROL) 1.25 MG (50000 UNIT) PO CAPS
50000.0000 [IU] | ORAL_CAPSULE | ORAL | 0 refills | Status: AC
  Filled 2024-02-20: qty 5, 35d supply, fill #0

## 2024-02-20 MED ORDER — POTASSIUM PHOSPHATES 15 MMOLE/5ML IV SOLN
30.0000 mmol | Freq: Once | INTRAVENOUS | Status: AC
  Administered 2024-02-20: 30 mmol via INTRAVENOUS
  Filled 2024-02-20: qty 10

## 2024-02-20 MED ORDER — FERROUS SULFATE 325 (65 FE) MG PO TABS
325.0000 mg | ORAL_TABLET | Freq: Every day | ORAL | 0 refills | Status: AC
  Filled 2024-02-20: qty 90, 90d supply, fill #0

## 2024-02-20 MED ORDER — MULTI-VITAMIN PO TABS
1.0000 | ORAL_TABLET | Freq: Every day | ORAL | 0 refills | Status: AC
  Filled 2024-02-20: qty 90, 90d supply, fill #0

## 2024-02-20 MED ORDER — ZINC SULFATE 220 (50 ZN) MG PO TABS
220.0000 mg | ORAL_TABLET | Freq: Every day | ORAL | 2 refills | Status: AC
  Filled 2024-02-20: qty 30, 30d supply, fill #0

## 2024-02-20 MED ORDER — K PHOS MONO-SOD PHOS DI & MONO 155-852-130 MG PO TABS
500.0000 mg | ORAL_TABLET | Freq: Once | ORAL | Status: AC
  Administered 2024-02-20: 500 mg via ORAL
  Filled 2024-02-20: qty 2

## 2024-02-20 MED ORDER — LEVOTHYROXINE SODIUM 200 MCG PO TABS
200.0000 ug | ORAL_TABLET | Freq: Every day | ORAL | 2 refills | Status: AC
  Filled 2024-02-20: qty 30, 30d supply, fill #0

## 2024-02-20 MED ORDER — PAROXETINE HCL 20 MG PO TABS
10.0000 mg | ORAL_TABLET | Freq: Every day | ORAL | 2 refills | Status: AC
  Filled 2024-02-20: qty 15, 30d supply, fill #0

## 2024-02-20 NOTE — Plan of Care (Signed)
" °  Problem: Education: Goal: Knowledge of General Education information will improve Description: Including pain rating scale, medication(s)/side effects and non-pharmacologic comfort measures Outcome: Adequate for Discharge   Problem: Health Behavior/Discharge Planning: Goal: Ability to manage health-related needs will improve Outcome: Adequate for Discharge   Problem: Clinical Measurements: Goal: Ability to maintain clinical measurements within normal limits will improve Outcome: Adequate for Discharge Goal: Will remain free from infection Outcome: Adequate for Discharge Goal: Diagnostic test results will improve Outcome: Adequate for Discharge Goal: Respiratory complications will improve Outcome: Adequate for Discharge Goal: Cardiovascular complication will be avoided Outcome: Adequate for Discharge   Problem: Activity: Goal: Risk for activity intolerance will decrease Outcome: Adequate for Discharge   Problem: Nutrition: Goal: Adequate nutrition will be maintained Outcome: Adequate for Discharge   Problem: Coping: Goal: Level of anxiety will decrease Outcome: Adequate for Discharge   Problem: Elimination: Goal: Will not experience complications related to bowel motility Outcome: Adequate for Discharge Goal: Will not experience complications related to urinary retention Outcome: Adequate for Discharge   Problem: Pain Managment: Goal: General experience of comfort will improve and/or be controlled Outcome: Adequate for Discharge   Problem: Safety: Goal: Ability to remain free from injury will improve Outcome: Adequate for Discharge   Problem: Skin Integrity: Goal: Risk for impaired skin integrity will decrease Outcome: Adequate for Discharge   Problem: Acute Rehab OT Goals (only OT should resolve) Goal: Pt. Will Transfer To Toilet Outcome: Adequate for Discharge Goal: Pt/Caregiver Will Perform Home Exercise Program Outcome: Adequate for Discharge Goal: OT  Additional ADL Goal #1 Outcome: Adequate for Discharge Goal: OT Additional ADL Goal #2 Outcome: Adequate for Discharge   Problem: Inadequate Intake (NI-2.1) Goal: Food and/or nutrient delivery Description: Individualized approach for food/nutrient provision. Outcome: Adequate for Discharge   "

## 2024-02-20 NOTE — Discharge Summary (Signed)
 "  Physician Discharge Summary  Renee [name] FMW:[id number] DOB: 1975/04/26 DOA: 02/17/2024  PCP: Pcp, No  Admit date: 02/17/2024 Discharge date: 02/20/2024  Admitted From: Home  Discharge disposition: Home   Recommendations for Outpatient Follow-Up:   Follow up with your primary care provider in one week/as has been scheduled.  Complete course of antibiotic. Patient had thrombocytopenia during hospitalization which will be Check CBC, BMP, magnesium , LFT, phosphorus in the next visit Will need to encourage her to take supplementation to prevent electrolyte imbalances.   Discharge Diagnosis:   Principal Problem:   Septic shock [facility])   Discharge Condition: Improved.  Diet recommendation: Regular  Wound care: None.  Code status: Full.   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  Imaging showed distended gallbladder and increased CBD diameter but no bowel obstruction.  There was diffuse wall thickening on the right upper quadrant ultrasound.  Labs were notable for hypokalemia hypomagnesemia and elevated LFTs and metabolic acidosis.  Patient was afebrile.  Initially received Flagyl  and cefepime .  General surgery was consulted and patient was admitted hospital for further evaluation and treatment.   Hospital Course:   Following conditions were addressed during hospitalization as listed below,  severe sepsis from E coli bacteremia chronic cholecystitis  Shock liver: Received NAC initially but since rapid improvement was obtained, NAC was not continued.  Patient was started on IV Rocephin  and will be changed to cefadroxil  500 mg p.o. twice daily on discharge to complete 7-day course..  2D echocardiogram showed LV ejection  fraction of 60 to 65% with no regional wall motion abnormality.    HIDA scan showed chronic cholecystitis.  LFTs trending down.  Previous panel was negative.  Patient has been started on regular diet.  Surgery had seen the patient during hospitalization and no surgical intervention has been planned.  Metabolic encephalopathy, resolved  Pancytopenia.   WBC improved.  Hemoglobin at 7.7 with thrombocytopenia and platelet of 45. will continue to monitor.  No evidence of bleeding.  Likely secondary to sepsis.  Will need to follow-up with PCP as outpatient.  Hypothyroidism-elevated TSH due to lack of compliance to medication.  Thrombocytopenia- associated with shock liver and sepsis, trend platelets.  Hypomagnesemia, hypophosphatemia and hypokalemia.   Repleted, related to issues from prior bariatric surgery and compliance  Metabolic acidosis- resolved  Deconditioning/debility.  Will get PT OT evaluation.   Medication compliance issues- secondary to financial constraints.  TOC consultation   Disposition.  At this time, patient is stable for disposition  Medical Consultants:   None.  Procedures:    *** Subjective:   Today, patient   Discharge Exam:   Vitals:   02/20/24 0430 02/20/24 0745  BP: 97/69 99/65  Pulse: 74 63  Resp: 18   Temp: 97.8 F (36.6 C) 98.3 F (36.8 C)  SpO2: 93% 99%   Vitals:   02/19/24 1510 02/19/24 1946 02/20/24 0430 02/20/24 0745  BP:  123/83 97/69 99/65   Pulse:  63 74 63  Resp:  18 18   Temp: 97.7 F (36.5 C) 97.8 F (36.6 C) 97.8 F (36.6 C) 98.3 F (36.8 C)  TempSrc: Oral Oral Oral Oral  SpO2:  100% 93% 99%  Weight: 68.7 kg     Height: 5' 7 (1.702 m)  General: Alert awake, not in obvious distress HENT: pupils equally reacting to light,  No scleral pallor or icterus noted. Oral mucosa is moist.  Chest:  Clear breath sounds.  Diminished breath sounds bilaterally. No crackles or wheezes.  CVS: S1 &S2 heard. No murmur.  Regular rate  and rhythm. Abdomen: Soft, nontender, nondistended.  Bowel sounds are heard.   Extremities: No cyanosis, clubbing or edema.  Peripheral pulses are palpable. Psych: Alert, awake and oriented, normal mood CNS:  No cranial nerve deficits.  Power equal in all extremities.   Skin: Warm and dry.  No rashes noted.  The results of significant diagnostics from this hospitalization (including imaging, microbiology, ancillary and laboratory) are listed below for reference.     Diagnostic Studies:   DG CHEST PORT 1 VIEW Result Date: 02/18/2024 EXAM: 1 VIEW XRAY OF THE CHEST 02/18/2024 08:44:18 PM COMPARISON: 11/28/2023 CLINICAL HISTORY: Encounter for central line placement. FINDINGS: LINES, TUBES AND DEVICES: Right PICC line in place with tip overlying lower portion of superior vena cava. LUNGS AND PLEURA: Low lung volume. No focal pulmonary opacity. No pleural effusion. No pneumothorax. HEART AND MEDIASTINUM: No acute abnormality of the cardiac and mediastinal silhouettes. BONES AND SOFT TISSUES: Old healed right rib fractures. No acute osseous abnormality. IMPRESSION: 1. Right PICC line terminates in the lower superior vena cava. 2. Old healed right rib fractures. Electronically signed by: Dorethia Molt MD 02/18/2024 08:53 PM EST RP Workstation: [id number]   NM Hepatobiliary Liver Func Result Date: 02/18/2024 EXAM: NM HEPATOBILLARY SCAN 02/18/2024 07:24:13 PM TECHNIQUE: RADIOPHARMACEUTICAL: 5.3 mCi Tc-50m mebrofenin  Dynamic images of the abdomen and pelvis were obtained in the anterior projection for 1 hour after intravenous administration of radiopharmaceutical. Additional delayed images are obtained up to 4 hours. COMPARISON: CT abdomen and pelvis 02/17/2024 and ultrasound abdomen 02/17/2024. CLINICAL HISTORY: Cholecystitis; elevated LFTs. Cholecystitis; elevated liver function tests. FINDINGS: Homogenous uptake within the liver. Normal clearance of the blood pool. Prompt and symmetrical uptake of activity  into the bladder with early visualization of activity in the extrahepatic bile ducts. Limited gallbladder activity is suggested on 2-hour delayed images. Activity is visualized in the small bowel. IMPRESSION: 1. Limited activity is demonstrated in the gallbladder on delayed imaging suggesting chronic cholecystitis. 2. No evidence of biliary obstruction. Electronically signed by: Elsie Gravely MD 02/18/2024 07:44 PM EST RP Workstation: [id number]   US  EKG SITE RITE Result Date: 02/17/2024 If Site Rite image not attached, placement could not be confirmed due to current cardiac rhythm.  US  Abdomen Limited RUQ (LIVER/GB) Result Date: 02/17/2024 CLINICAL DATA:  Abdominal pain. Distended gallbladder and biliary dilatation on recent CT. EXAM: ULTRASOUND ABDOMEN LIMITED RIGHT UPPER QUADRANT COMPARISON:  CT on 02/17/2024 FINDINGS: Gallbladder: Markedly dilated gallbladder measuring 13.0 x 6.1 cm. Echogenic sludge is seen in the gallbladder lumen, but no discrete gallstones are visualized. Diffuse gallbladder wall thickening is seen measuring up to 9 mm, but no sonographic Murphy sign noted by the sonographer, and no evidence of pericholecystic fluid. Common bile duct: Diameter: Mildly dilated measuring 10 mm in diameter. No definite intrahepatic ductal dilatation seen. Liver: Diffusely increased parenchymal echogenicity consistent with steatosis. No focal liver lesions identified. Small amount of perihepatic fluid seen. Portal vein is patent on color Doppler imaging with normal direction of blood flow towards the liver. Other: None. IMPRESSION: Markedly dilated gallbladder, with echogenic sludge but no definite gallstones. Diffuse gallbladder wall thickening, but no sonographic Murphy sign or pericholecystic fluid. These findings are still suspicious for acute cholecystitis. Mild dilatation of  common bile duct measuring 10 mm. Hepatic steatosis, and small amount of perihepatic fluid. Electronically Signed   By: Norleen [name] Kil M.D.   On: 02/17/2024 09:28   CT Angio Chest/Abd/Pel for Dissection W and/or W/WO Result Date: 02/17/2024 CLINICAL DATA:  Chest pain and hypotension. Clinical concern for aortic dissection. EXAM: CT ANGIOGRAPHY CHEST, ABDOMEN AND PELVIS TECHNIQUE: Non-contrast CT of the chest was initially obtained. Multidetector CT imaging through the chest, abdomen and pelvis was performed using the standard protocol during bolus administration of intravenous contrast. Multiplanar reconstructed images and MIPs were obtained and reviewed to evaluate the vascular anatomy. RADIATION DOSE REDUCTION: This exam was performed according to the departmental dose-optimization program which includes automated exposure control, adjustment of the mA and/or kV according to patient size and/or use of iterative reconstruction technique. CONTRAST:  OMNIPAQUE  IOHEXOL  350 MG/ML SOLN COMPARISON:  Abdomen pelvis CT 12/28/2023 FINDINGS: CTA CHEST FINDINGS Cardiovascular: Pre contrast imaging shows no hyperdense crescent in the wall of the thoracic aorta to suggest the presence of an acute intramural hematoma. Imaging after IV contrast administration shows no thoracic aortic aneurysm. There is no dissection of the thoracic aorta. Enlargement of the pulmonary outflow tract/main pulmonary arteries suggests pulmonary arterial hypertension. No CT evidence for acute pulmonary embolus Mediastinum/Nodes: No mediastinal lymphadenopathy. There is no hilar lymphadenopathy. The esophagus has normal imaging features. There is no axillary lymphadenopathy. Lungs/Pleura: Dependent ground-glass opacity in both lungs is likely atelectatic. No dense focal airspace consolidation. No pulmonary edema or substantial pleural effusion. Musculoskeletal: Multiple old left rib fractures evident. Review of the MIP images confirms the above findings. CTA ABDOMEN AND PELVIS FINDINGS VASCULAR Aorta: Normal caliber aorta without aneurysm, dissection, vasculitis or  significant stenosis. Celiac: Patent without evidence of aneurysm, dissection, vasculitis or significant stenosis. SMA: Patent without evidence of aneurysm, dissection, vasculitis or significant stenosis. Renals: Both renal arteries are patent without evidence of aneurysm, dissection, vasculitis, fibromuscular dysplasia or significant stenosis. IMA: Patent without evidence of aneurysm, dissection, vasculitis or significant stenosis. Inflow: Patent without evidence of aneurysm, dissection, vasculitis or significant stenosis. Veins: No obvious venous abnormality within the limitations of this arterial phase study. Review of the MIP images confirms the above findings. NON-VASCULAR Hepatobiliary: Mildly heterogeneous liver perfusion on arterial phase imaging. Periportal edema evident. Gallbladder is massively dilated and progressive since prior measuring on the order of 16.6 x 7.0 cm today compared to 12.1 x 3.7 cm previously. No definite gallstones no perceptible gallbladder wall thickening or pericholecystic fluid. Common bile duct in the head of the pancreas measures up to 11 mm diameter increased from 7 mm previously. Pancreas: No focal mass lesion. No dilatation of the main duct. No intraparenchymal cyst. No peripancreatic edema. Spleen: No splenomegaly. No suspicious focal mass lesion. Adrenals/Urinary Tract: No adrenal nodule or mass. Kidneys unremarkable. No evidence for hydroureter. The urinary bladder appears normal for the degree of distention. Stomach/Bowel: Surgical changes noted in the stomach. 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 Multiple small bowel anastomoses are identified in the abdomen pelvis. Neither the terminal ileum nor the appendix are discernible. Patient has a patulous, markedly elongated/redundant colon. Although there is diffuse generalized distention of small bowel and colon, appearance is not in a  frankly obstructive pattern and the colon is diffusely patulous filled with gas and stool from the cecum to the rectum. No bowel wall thickening or pneumatosis. Mild perirectal edema noted on a background of diffuse body wall edema.  Lymphatic: There is no gastrohepatic or hepatoduodenal ligament lymphadenopathy. No retroperitoneal or mesenteric lymphadenopathy. No pelvic sidewall lymphadenopathy. Reproductive: No adnexal mass. Other: No substantial intraperitoneal free fluid. Musculoskeletal: Diffuse body wall edema evident. No worrisome lytic or sclerotic osseous abnormality. Review of the MIP images confirms the above findings. IMPRESSION: 1. No evidence for thoracoabdominal aortic aneurysm or dissection. 2. Enlargement of the pulmonary outflow tract/main pulmonary arteries suggests pulmonary arterial hypertension. 3. Massively dilated gallbladder, progressive since prior. No definite gallstones or perceptible gallbladder wall thickening. Common bile duct in the head of the pancreas measures up to 11 mm diameter increased from 7 mm previously. Correlation with liver function test recommended. Right upper quadrant ultrasound may prove helpful to further evaluate. 4. Diffuse generalized distention of small bowel and colon, not in a frankly obstructive pattern. No bowel wall thickening or pneumatosis. Imaging features may be related to diffuse ileus. 5. Mild perirectal edema, nonspecific, on a background of diffuse body wall edema. Electronically Signed   By: Camellia Candle M.D.   On: 02/17/2024 05:53     Labs:   Basic Metabolic Panel: Recent Labs  Lab 02/17/24 0721 02/17/24 1359 02/17/24 2302 02/18/24 0242 02/18/24 0759 02/18/24 2021 02/19/24 0447 02/20/24 0130  NA  --    < > 143 142 144  --  142 142  K  --    < > 3.2* 3.4* 3.2*  --  2.8* 2.9*  CL  --    < > 115* 114* 112*  --  105 106  CO2  --    < > 13* 11* 18*  --  28 31  GLUCOSE  --    < > 88 77 88  --  100* 77  BUN  --    < > 13 12 11   --   9 6  CREATININE  --    < > 0.64 0.63 0.60  --  0.51 0.43*  CALCIUM   --    < > 7.3* 7.4* 7.3*  --  7.2* 7.0*  MG 1.3*  --  1.5* 1.8  --   --  1.8 1.9  PHOS  --   --   --   --   --  1.9* 1.8* 1.5*   < > = values in this interval not displayed.   GFR Estimated Creatinine Clearance: 83.6 mL/min (A) (by C-G formula based on SCr of 0.43 mg/dL (L)). Liver Function Tests: Recent Labs  Lab 02/17/24 0503 02/17/24 2302 02/18/24 0242 02/19/24 0447 02/20/24 0130  AST 388* 1,947* 1,654* 290* 116*  ALT 78* 531* 537* 292* 189*  ALKPHOS 183* 368* 386* 197* 177*  BILITOT 0.9 1.5* 1.7* 0.6 0.4  PROT 5.7* 4.8* 5.0* 4.7* 4.3*  ALBUMIN  3.3* 2.6* 2.5* 2.7* 2.5*   Recent Labs  Lab 02/17/24 0503  LIPASE 55*   No results for input(s): AMMONIA in the last 168 hours. Coagulation profile Recent Labs  Lab 02/18/24 0759 02/18/24 2021 02/18/24 2028 02/19/24 0447  INR 1.9* 1.7* 1.6* 1.7*    CBC: Recent Labs  Lab 02/17/24 0503 02/17/24 1052 02/17/24 2302 02/18/24 0242 02/18/24 2028 02/19/24 0447 02/20/24 0130  WBC 8.2 2.0* 7.1 14.4*  --  14.0* 7.0  NEUTROABS 4.2  --  6.4  --   --   --   --   HGB 11.1* 11.5* 10.9* 10.6*  --  9.3* 7.7*  HCT 34.4* 35.4* 34.8* 33.6*  --  26.7* 22.8*  MCV 95.0 92.4 97.8 96.6  --  88.1 91.2  PLT 178 136* 105* 130* 77* 71* 45*   Cardiac Enzymes: Recent Labs  Lab 02/18/24 2021  CKTOTAL 237*  CKMB 5.3*   BNP: Invalid input(s): POCBNP CBG: Recent Labs  Lab 02/18/24 2100 02/18/24 2342 02/19/24 0434 02/19/24 0741 02/19/24 1139  GLUCAP 136* 106* 102* 98 117*   D-Dimer Recent Labs    02/18/24 2028  DDIMER 1.79*   Hgb A1c No results for input(s): HGBA1C in the last 72 hours. Lipid Profile No results for input(s): CHOL, HDL, LDLCALC, TRIG, CHOLHDL, LDLDIRECT in the last 72 hours. Thyroid  function studies Recent Labs    02/18/24 0759  T3FREE 0.8*   Anemia work up Recent Labs    02/18/24 2021 02/18/24 2025 02/18/24 2028  02/20/24 0130  VITAMINB12  --  929*  --   --   FOLATE  --  6.1  --   --   FERRITIN 127  --   --  64  TIBC 217*  --   --  189*  IRON 21*  --   --  78  RETICCTPCT  --   --  1.2  --    Microbiology Recent Results (from the past 240 hours)  Blood culture (routine x 2)     Status: Abnormal   Collection Time: 02/17/24  5:30 AM   Specimen: BLOOD LEFT ARM  Result Value Ref Range Status   Specimen Description BLOOD LEFT ARM  Final   Special Requests   Final    BOTTLES DRAWN AEROBIC AND ANAEROBIC Blood Culture results may not be optimal due to an inadequate volume of blood received in culture bottles   Culture  Setup Time   Final    GRAM NEGATIVE RODS IN BOTH AEROBIC AND ANAEROBIC BOTTLES CRITICAL RESULT CALLED TO, READ BACK BY AND VERIFIED WITH: PHARMD GARRETT UHLORN [id number] AT 1924 BY EC Performed at [facility], [address], [place], [place] [address]    Culture ESCHERICHIA COLI (A)  Final   Report Status 02/19/2024 FINAL  Final   Organism ID, Bacteria ESCHERICHIA COLI  Final      Susceptibility   Escherichia coli - MIC*    AMPICILLIN 8 SENSITIVE Sensitive     CEFAZOLIN  (NON-URINE) <=1 SENSITIVE Sensitive     CEFEPIME  <=0.12 SENSITIVE Sensitive     ERTAPENEM <=0.12 SENSITIVE Sensitive     CEFTRIAXONE  <=0.25 SENSITIVE Sensitive     CIPROFLOXACIN  <=0.06 SENSITIVE Sensitive     GENTAMICIN <=1 SENSITIVE Sensitive     MEROPENEM <=0.25 SENSITIVE Sensitive     TRIMETH /SULFA  <=20 SENSITIVE Sensitive     AMPICILLIN/SULBACTAM <=2 SENSITIVE Sensitive     PIP/TAZO Value in next row Sensitive      <=4 SENSITIVEThis is a modified FDA-approved test that has been validated and its performance characteristics determined by the reporting laboratory.  This laboratory is certified under the Clinical Laboratory Improvement Amendments CLIA as qualified to perform high complexity clinical laboratory testing.    * ESCHERICHIA COLI  Blood Culture ID Panel (Reflexed)     Status: Abnormal    Collection Time: 02/17/24  5:30 AM  Result Value Ref Range Status   Enterococcus faecalis NOT DETECTED NOT DETECTED Final   Enterococcus Faecium NOT DETECTED NOT DETECTED Final   Listeria monocytogenes NOT DETECTED NOT DETECTED Final   Staphylococcus species NOT DETECTED NOT DETECTED Final   Staphylococcus aureus (BCID) NOT DETECTED NOT DETECTED Final   Staphylococcus epidermidis NOT DETECTED NOT DETECTED Final   Staphylococcus lugdunensis NOT DETECTED NOT DETECTED  Final   Streptococcus species NOT DETECTED NOT DETECTED Final   Streptococcus agalactiae NOT DETECTED NOT DETECTED Final   Streptococcus pneumoniae NOT DETECTED NOT DETECTED Final   Streptococcus pyogenes NOT DETECTED NOT DETECTED Final   A.calcoaceticus-baumannii NOT DETECTED NOT DETECTED Final   Bacteroides fragilis NOT DETECTED NOT DETECTED Final   Enterobacterales DETECTED (A) NOT DETECTED Final    Comment: Enterobacterales represent a large order of gram negative bacteria, not a single organism. CRITICAL RESULT CALLED TO, READ BACK BY AND VERIFIED WITH: PHARMD HONORA BRAZIER [id number] AT 1924 BY EC    Enterobacter cloacae complex NOT DETECTED NOT DETECTED Final   Escherichia coli DETECTED (A) NOT DETECTED Final    Comment: CRITICAL RESULT CALLED TO, READ BACK BY AND VERIFIED WITH: PHARMD HONORA BRAZIER [id number] AT 1924 BY EC    Klebsiella aerogenes NOT DETECTED NOT DETECTED Final   Klebsiella oxytoca NOT DETECTED NOT DETECTED Final   Klebsiella pneumoniae NOT DETECTED NOT DETECTED Final   Proteus species NOT DETECTED NOT DETECTED Final   Salmonella species NOT DETECTED NOT DETECTED Final   Serratia marcescens NOT DETECTED NOT DETECTED Final   Haemophilus influenzae NOT DETECTED NOT DETECTED Final   Neisseria meningitidis NOT DETECTED NOT DETECTED Final   Pseudomonas aeruginosa NOT DETECTED NOT DETECTED Final   Stenotrophomonas maltophilia NOT DETECTED NOT DETECTED Final   Candida albicans NOT DETECTED NOT DETECTED  Final   Candida auris NOT DETECTED NOT DETECTED Final   Candida glabrata NOT DETECTED NOT DETECTED Final   Candida krusei NOT DETECTED NOT DETECTED Final   Candida parapsilosis NOT DETECTED NOT DETECTED Final   Candida tropicalis NOT DETECTED NOT DETECTED Final   Cryptococcus neoformans/gattii NOT DETECTED NOT DETECTED Final   CTX-M ESBL NOT DETECTED NOT DETECTED Final   Carbapenem resistance IMP NOT DETECTED NOT DETECTED Final   Carbapenem resistance KPC NOT DETECTED NOT DETECTED Final   Carbapenem resistance NDM NOT DETECTED NOT DETECTED Final   Carbapenem resist OXA 48 LIKE NOT DETECTED NOT DETECTED Final   Carbapenem resistance VIM NOT DETECTED NOT DETECTED Final    Comment: Performed at [facility], [address], [place], [place] [address]  Blood culture (routine x 2)     Status: None (Preliminary result)   Collection Time: 02/17/24  5:47 AM   Specimen: BLOOD LEFT HAND  Result Value Ref Range Status   Specimen Description BLOOD LEFT HAND  Final   Special Requests   Final    BOTTLES DRAWN AEROBIC ONLY Blood Culture results may not be optimal due to an inadequate volume of blood received in culture bottles   Culture   Final    NO GROWTH 3 DAYS Performed at [facility], [address] [place], [place] [address]    Report Status PENDING  Incomplete  MRSA Next Gen by PCR, Nasal     Status: None   Collection Time: 02/17/24  1:18 PM   Specimen: Nasal Mucosa; Nasal Swab  Result Value Ref Range Status   MRSA by PCR Next Gen NOT DETECTED NOT DETECTED Final    Comment: (NOTE) The GeneXpert MRSA Assay (FDA approved for NASAL specimens only), is one component of a comprehensive MRSA colonization surveillance program. It is not intended to diagnose MRSA infection nor to guide or monitor treatment for MRSA infections. Test performance is not FDA approved in patients less than 33 years old. Performed at [facility], [address], [place], [place] [address]  Discharge Instructions:   Discharge Instructions     Call MD for:  persistant nausea and vomiting   Complete by: As directed    Call MD for:  severe uncontrolled pain   Complete by: As directed    Call MD for:  temperature >100.4   Complete by: As directed    Diet general   Complete by: As directed    Discharge instructions   Complete by: As directed    Take medications as prescribed, seek medical attention for worsening symptoms.  Follow-up with your primary care provider in 1 week or as he has been scheduled.   Increase activity slowly   Complete by: As directed       Allergies as of 02/20/2024       Reactions   Morphine And Codeine Hives, Rash   Penicillins Hives, Rash   Diphenhydramine    Bendrayl-Has opposite effect    Latex Dermatitis   Paper tape ok   Synthroid  [levothyroxine ]    Name brand causes hair loss   Allopurinol Hives, Rash        Medication List     STOP taking these medications    calcium  citrate 950 (200 Ca) MG tablet Commonly known as: CALCITRATE - dosed in mg elemental calcium    ciprofloxacin  500 MG tablet Commonly known as: CIPRO    Oxycodone  HCl 10 MG Tabs   oxyCODONE -acetaminophen  5-325 MG tablet Commonly known as: PERCOCET/ROXICET       TAKE these medications    acetaminophen  325 MG tablet Commonly known as: TYLENOL  Take 2 tablets (650 mg total) by mouth every 6 (six) hours as needed for mild pain (pain score 1-3).   amphetamine -dextroamphetamine  30 MG tablet Commonly known as: ADDERALL Take 1 tablet by mouth 2 (two) times daily.   calcium  carbonate 500 MG chewable tablet Commonly known as: TUMS - dosed in mg elemental calcium  Chew 2 tablets (400 mg of elemental calcium  total) by mouth 3 (three) times daily with meals.   cefadroxil  500 MG capsule Commonly known as: DURICEF Take 1 capsule (500 mg total) by mouth 2 (two) times daily for 5 days.   ferrous sulfate  325 (65 FE) MG tablet Take 1 tablet (325 mg total)  by mouth daily with breakfast. Start taking on: February 21, 2024   fluticasone  50 MCG/ACT nasal spray Commonly known as: FLONASE  Place 2 sprays into both nostrils daily as needed for allergies.   folic acid  1 MG tablet Commonly known as: FOLVITE  Take 1 tablet (1 mg total) by mouth daily.   hydrOXYzine  25 MG tablet Commonly known as: ATARAX  Take 1 tablet (25 mg total) by mouth every 6 (six) hours as needed for anxiety or itching. What changed: reasons to take this   levothyroxine  200 MCG tablet Commonly known as: SYNTHROID  Take 1 tablet (200 mcg total) by mouth daily before breakfast. What changed: when to take this   magnesium  oxide 400 (240 Mg) MG tablet Commonly known as: MAG-OX Take 1 tablet (400 mg total) by mouth 2 (two) times daily.   montelukast  10 MG tablet Commonly known as: SINGULAIR  Take 1 tablet (10 mg total) by mouth at bedtime.   Multi-Vitamin tablet Take 1 tablet by mouth daily. What changed:  how much to take when to take this   omeprazole  40 MG capsule Commonly known as: PRILOSEC Take 1 capsule (40 mg total) by mouth 2 (two) times daily before a meal.   PARoxetine  20 MG tablet Commonly known as: PAXIL  Take 0.5 tablets (10  mg total) by mouth daily. What changed: medication strength   phosphorus [id number] MG tablet Commonly known as: K PHOS  NEUTRAL Take 2 tablets (500 mg total) by mouth daily for 10 days.   polyethylene glycol powder 17 GM/SCOOP powder Commonly known as: GLYCOLAX /MIRALAX  Dissolve 1 capful (17g) in 4-8 ounces of liquid and take by mouth daily as needed for mild constipation.   thiamine  100 MG tablet Commonly known as: VITAMIN B1 Take 1 tablet (100 mg total) by mouth daily.   traZODone  150 MG tablet Commonly known as: DESYREL  Take 0.5 tablets (75 mg total) by mouth at bedtime.   Vitamin D  (Ergocalciferol ) 1.25 MG (50000 UNIT) Caps capsule Commonly known as: DRISDOL  Take 1 capsule (50,000 Units total) by mouth every 7  (seven) days.   Zinc  Sulfate 220 (50 Zn) MG Tabs Take 1 tablet (220 mg total) by mouth daily.          Time coordinating discharge: 39 minutes  Signed:  Julya Alioto  [facility] 02/20/2024, 12:58 PM          "

## 2024-02-20 NOTE — Plan of Care (Addendum)
 Patient calm and cooperative A&O X4, medications tolerated well. Patient ambulated to bathroom. Patient left with call bell in reach and bed in lowest position.   Problem: Education: Goal: Knowledge of General Education information will improve Description: Including pain rating scale, medication(s)/side effects and non-pharmacologic comfort measures Outcome: Progressing   Problem: Health Behavior/Discharge Planning: Goal: Ability to manage health-related needs will improve Outcome: Progressing   Problem: Clinical Measurements: Goal: Ability to maintain clinical measurements within normal limits will improve Outcome: Progressing   Problem: Activity: Goal: Risk for activity intolerance will decrease Outcome: Progressing   Problem: Nutrition: Goal: Adequate nutrition will be maintained Outcome: Progressing   Problem: Coping: Goal: Level of anxiety will decrease Outcome: Progressing   Problem: Elimination: Goal: Will not experience complications related to bowel motility Outcome: Progressing   Problem: Pain Managment: Goal: General experience of comfort will improve and/or be controlled Outcome: Progressing   Problem: Safety: Goal: Ability to remain free from injury will improve Outcome: Progressing

## 2024-02-20 NOTE — Hospital Course (Addendum)
 Renee [name]

## 2024-02-20 NOTE — Evaluation (Signed)
 Physical Therapy Evaluation and D/C Patient Details Name: Renee [name] MRN: [id number] DOB: [birth year] Today's Date: 02/20/2024  History of Present Illness  Pt is a 49 y/o female presenting with chest pain radiating to her back. Imaging showed significantly distended gallbladder, nonobstructive bowel distention and acute chole. Admitted for treatment of septic shock. 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  Pt currently without functional limitations and does not need skilled PT as pt is independent. No LOB with challenges.  Likely will go home today and doesn't need f/u or equipment. Will sign off.     If plan is discharge home, recommend the following:     Can travel by private vehicle        Equipment Recommendations None recommended by PT  Recommendations for Other Services       Functional Status Assessment Patient has not had a recent decline in their functional status     Precautions / Restrictions Precautions Precautions: Fall Restrictions Weight Bearing Restrictions Per Provider Order: No      Mobility  Bed Mobility Overal bed mobility: Needs Assistance Bed Mobility: Supine to Sit, Sit to Supine     Supine to sit: Independent Sit to supine: Independent        Transfers Overall transfer level: Needs assistance Equipment used: None Transfers: Sit to/from Stand Sit to Stand: Independent                Ambulation/Gait Ambulation/Gait assistance: Independent Gait Distance (Feet): 80 Feet Assistive device: None Gait Pattern/deviations: WFL(Within Functional Limits)   Gait velocity interpretation: 1.31 - 2.62 ft/sec, indicative of limited community ambulator   General Gait Details: No LOB with min challenges to balance with out device. Pt was able to ambulate with good safety.  Stairs             Wheelchair Mobility     Tilt Bed    Modified Rankin (Stroke Patients Only)       Balance Overall balance assessment: Needs assistance Sitting-balance support: No upper extremity supported, Feet supported Sitting balance-Leahy Scale: Fair     Standing balance support: No upper extremity supported, During functional activity Standing balance-Leahy Scale: Fair                               Pertinent Vitals/Pain Pain Assessment Pain Assessment: No/denies pain    Home Living Family/patient expects to be discharged to:: Private residence Living Arrangements: Spouse/significant other;Other relatives;Non-relatives/Friends (mother in law and another roommate) Available Help at Discharge: Family Type of Home: House Home Access: Stairs to enter Entrance Stairs-Rails: None Entrance Stairs-Number of Steps: 2   Home Layout: One level Home Equipment: BSC/3in1;Cane - single point;Wheelchair - manual Additional Comments: thinks the [facility] was placed in the attic    Prior Function Prior Level of Function : Needs assist             Mobility Comments: no AD for mobility typically ADLs Comments: reports Independence with ADLs, IADLs, driving and grocery shopping     Extremity/Trunk Assessment   Upper Extremity Assessment Upper Extremity Assessment: Defer to OT evaluation    Lower Extremity Assessment Lower Extremity Assessment: Overall WFL for tasks assessed    Cervical / Trunk Assessment Cervical / Trunk Assessment: Normal  Communication   Communication Communication: No apparent difficulties    Cognition Arousal: Alert  Behavior During Therapy: Impulsive, WFL for tasks assessed/performed                           PT - Cognition Comments: Impulsive at times but was not unsafe during treatment. Following commands: Intact       Cueing Cueing Techniques: Verbal cues, Gestural cues     General Comments      Exercises      Assessment/Plan    PT Assessment Patient does not need any further PT services  PT Problem List         PT Treatment Interventions      PT Goals (Current goals can be found in the Care Plan section)  Acute Rehab PT Goals Patient Stated Goal: N/A PT Goal Formulation: All assessment and education complete, DC therapy    Frequency       Co-evaluation               AM-PAC PT 6 Clicks Mobility  Outcome Measure Help needed turning from your back to your side while in a flat bed without using bedrails?: None Help needed moving from lying on your back to sitting on the side of a flat bed without using bedrails?: None Help needed moving to and from a bed to a chair (including a wheelchair)?: None Help needed standing up from a chair using your arms (e.g., wheelchair or bedside chair)?: None Help needed to walk in hospital room?: None Help needed climbing 3-5 steps with a railing? : None 6 Click Score: 24    End of Session   Activity Tolerance: Patient tolerated treatment well Patient left: with call bell/phone within reach;in bed Nurse Communication: Mobility status PT Visit Diagnosis: Muscle weakness (generalized) (M62.81)    Time: 8874-8857 PT Time Calculation (min) (ACUTE ONLY): 17 min   Charges:   PT Evaluation $PT Eval Low Complexity: 1 Low   PT General Charges $$ ACUTE PT VISIT: 1 Visit         Johnesha Acheampong M,PT Acute Rehab Services [phone]   Stephane JULIANNA Bevel 02/20/2024, 1:01 PM
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
## 2024-02-21 LAB — MISC LABCORP TEST (SEND OUT): Labcorp test code: 716910

## 2024-02-21 LAB — HEPARIN INDUCED PLATELET AB (HIT ANTIBODY): Heparin Induced Plt Ab: 0.149 {OD_unit} (ref 0.000–0.400)

## 2024-02-21 LAB — ZINC: Zinc: 37 ug/dL — ABNORMAL LOW (ref 44–115)

## 2024-02-21 LAB — COPPER, SERUM: Copper: 36 ug/dL — ABNORMAL LOW (ref 76–142)

## 2024-02-22 LAB — CULTURE, BLOOD (ROUTINE X 2): Culture: NO GROWTH

## 2024-02-22 LAB — VITAMIN B6: Vitamin B6: 4.1 ug/L (ref 3.4–65.2)

## 2024-02-22 LAB — VITAMIN C: Vitamin C: 0.3 mg/dL — ABNORMAL LOW (ref 0.4–2.0)

## 2024-02-23 LAB — VITAMIN B1: Vitamin B1 (Thiamine): 242.2 nmol/L — ABNORMAL HIGH (ref 66.5–200.0)

## 2024-02-24 LAB — VITAMIN E
Vitamin E (Alpha Tocopherol): 7.5 mg/L (ref 7.0–25.1)
Vitamin E(Gamma Tocopherol): 0.3 mg/L — ABNORMAL LOW (ref 0.5–5.5)

## 2024-02-24 LAB — VITAMIN A: Vitamin A (Retinoic Acid): <2.5 ug/dL — ABNORMAL LOW (ref 20.1–62.0)

## 2024-02-26 ENCOUNTER — Other Ambulatory Visit: Payer: Self-pay

## 2024-02-26 ENCOUNTER — Ambulatory Visit: Payer: Self-pay | Admitting: Critical Care Medicine

## 2024-02-27 ENCOUNTER — Ambulatory Visit: Payer: Self-pay | Admitting: *Deleted

## 2024-02-28 ENCOUNTER — Telehealth: Payer: Self-pay | Admitting: *Deleted

## 2024-02-28 NOTE — Telephone Encounter (Signed)
 02/28/24 Left message confirming upcoming appointment

## 2024-02-29 ENCOUNTER — Ambulatory Visit: Admitting: *Deleted

## 2024-06-03 ENCOUNTER — Ambulatory Visit: Admitting: Endocrinology
